# Patient Record
Sex: Female | Born: 1950 | Race: White | Hispanic: No
Health system: Midwestern US, Community
[De-identification: ages and names within clinical notes are randomized; demographics above are authoritative.]

## PROBLEM LIST (undated history)

## (undated) DIAGNOSIS — R112 Nausea with vomiting, unspecified: Secondary | ICD-10-CM

## (undated) DIAGNOSIS — F32A Depression, unspecified: Secondary | ICD-10-CM

## (undated) DIAGNOSIS — D485 Neoplasm of uncertain behavior of skin: Secondary | ICD-10-CM

## (undated) DIAGNOSIS — R21 Rash and other nonspecific skin eruption: Secondary | ICD-10-CM

## (undated) DIAGNOSIS — B370 Candidal stomatitis: Secondary | ICD-10-CM

## (undated) DIAGNOSIS — F5101 Primary insomnia: Secondary | ICD-10-CM

## (undated) DIAGNOSIS — R1033 Periumbilical pain: Secondary | ICD-10-CM

## (undated) DIAGNOSIS — D472 Monoclonal gammopathy: Secondary | ICD-10-CM

## (undated) DIAGNOSIS — I10 Essential (primary) hypertension: Secondary | ICD-10-CM

## (undated) DIAGNOSIS — K589 Irritable bowel syndrome without diarrhea: Secondary | ICD-10-CM

## (undated) DIAGNOSIS — F339 Major depressive disorder, recurrent, unspecified: Secondary | ICD-10-CM

## (undated) DIAGNOSIS — K3184 Gastroparesis: Secondary | ICD-10-CM

## (undated) DIAGNOSIS — K279 Peptic ulcer, site unspecified, unspecified as acute or chronic, without hemorrhage or perforation: Secondary | ICD-10-CM

## (undated) DIAGNOSIS — F17200 Nicotine dependence, unspecified, uncomplicated: Secondary | ICD-10-CM

## (undated) DIAGNOSIS — M1990 Unspecified osteoarthritis, unspecified site: Secondary | ICD-10-CM

## (undated) DIAGNOSIS — J449 Chronic obstructive pulmonary disease, unspecified: Secondary | ICD-10-CM

## (undated) DIAGNOSIS — K900 Celiac disease: Secondary | ICD-10-CM

## (undated) DIAGNOSIS — J439 Emphysema, unspecified: Secondary | ICD-10-CM

## (undated) DIAGNOSIS — M0520 Rheumatoid vasculitis with rheumatoid arthritis of unspecified site: Secondary | ICD-10-CM

## (undated) DIAGNOSIS — M810 Age-related osteoporosis without current pathological fracture: Secondary | ICD-10-CM

## (undated) DIAGNOSIS — G9340 Encephalopathy, unspecified: Secondary | ICD-10-CM

## (undated) DIAGNOSIS — E785 Hyperlipidemia, unspecified: Secondary | ICD-10-CM

## (undated) DIAGNOSIS — K219 Gastro-esophageal reflux disease without esophagitis: Secondary | ICD-10-CM

## (undated) DIAGNOSIS — L280 Lichen simplex chronicus: Secondary | ICD-10-CM

## (undated) HISTORY — DX: Gastroparesis: K31.84

## (undated) HISTORY — DX: Gastro-esophageal reflux disease without esophagitis: K21.9

## (undated) HISTORY — PX: ABCESS DRAINAGE: SHX399

## (undated) HISTORY — DX: Celiac disease: K90.0

## (undated) HISTORY — DX: Encephalopathy, unspecified: G93.40

## (undated) HISTORY — DX: Irritable bowel syndrome, unspecified: K58.9

## (undated) HISTORY — DX: Unspecified osteoarthritis, unspecified site: M19.90

## (undated) HISTORY — DX: Peptic ulcer, site unspecified, unspecified as acute or chronic, without hemorrhage or perforation: K27.9

## (undated) HISTORY — DX: Monoclonal gammopathy: D47.2

## (undated) HISTORY — DX: Age-related osteoporosis without current pathological fracture: M81.0

## (undated) HISTORY — DX: Chronic obstructive pulmonary disease, unspecified: J44.9

## (undated) HISTORY — DX: Emphysema, unspecified: J43.9

## (undated) HISTORY — PX: OTHER SURGICAL HISTORY: SHX169

## (undated) HISTORY — DX: Essential (primary) hypertension: I10

## (undated) HISTORY — DX: Hyperlipidemia, unspecified: E78.5

## (undated) HISTORY — DX: Major depressive disorder, recurrent, unspecified: F33.9

## (undated) HISTORY — PX: COLONOSCOPY: SHX174

## (undated) HISTORY — DX: Nicotine dependence, unspecified, uncomplicated: F17.200

---

## 1975-12-30 HISTORY — PX: TONSILLECTOMY: SUR1361

## 2003-08-03 LAB — HM PAP SMEAR

## 2015-08-01 LAB — HM COLONOSCOPY

## 2018-01-02 NOTE — Nursing Note (Signed)
Medication Administration Follow Up-Text       Medication Administration Follow Up Entered On:  01/02/2018 22:50 EST    Performed On:  01/02/2018 22:34 EST by CREED, RN, MARY E      Intervention Information:     ondansetron  Performed by Fabian November, RN, MARY E on 01/02/2018 21:34:00 EST       ondansetron,4mg   Oral       Med Response   ED Medication Response :   No adverse reaction, Symptoms improved   Pasero Opioid Induced Sedation Scale :   S = Sleep, easy to arouse   CREED, RN, MARY E - 01/02/2018 22:49 EST

## 2018-01-02 NOTE — Nursing Note (Signed)
 Adult Patient History Form-Text       Adult Patient History Entered On:  01/02/2018 22:47 EST    Performed On:  01/02/2018 22:29 EST by CREED, RN, MARY E               General Info   Patient Identified :   Identification band, Verbal   Information Given By :   Self   In Charge of News (ICON) Name :   Morgan Alexander   Pregnancy Status :   N/A   In Charge of News (ICON) Code :   3436   Has the patient received chemotherapy or biotherapy within the last 48 hours? :   No   In Clinical Trial With Signed Consent for Related Condition :   No signed consent for clinical trial   Is the patient currently (2-3 days) receiving radiation treatment? :   No   CREED, RN, MARY E - 01/02/2018 22:29 EST   Allergies   (As Of: 01/02/2018 22:47:40 EST)   Allergies (Active)   shellfish  Estimated Onset Date:   Unspecified ; Reactions:   Rash ; Created By:   Joesph, RN, Diane C; Reaction Status:   Active ; Category:   Drug ; Substance:   shellfish ; Type:   Allergy ; Severity:   Mild ; Updated By:   Joesph, RN, Diane C; Source:   Patient ; Reviewed Date:   01/02/2018 22:32 EST        Medication History   Medication List   (As Of: 01/02/2018 22:47:40 EST)   Normal Order    albuterol 2.5 mg/3 mL (0.083%) Inh Soln 3 mL  :   albuterol 2.5 mg/3 mL (0.083%) Inh Soln 3 mL ; Status:   Completed ; Ordered As Mnemonic:   albuterol 2.5 mg/3 mL (0.083%) inhalation solution ; Simple Display Line:   5 mg, 6 mL, NEB, q20min, PRN: shortness of breath or wheezing ; Ordering Provider:   FREDERICH,  MATTHEW C; Catalog Code:   albuterol ; Order Dt/Tm:   01/02/2018 15:52:02          azithromycin 250 mg Tab  :   azithromycin 250 mg Tab ; Status:   Completed ; Ordered As Mnemonic:   Zithromax ; Simple Display Line:   500 mg, 2 tabs, Oral, Once ; Ordering Provider:   HOSKINS-MD,  MATTHEW C; Catalog Code:   azithromycin ; Order Dt/Tm:   01/02/2018 17:58:30          budesonide-formoterol 160 mcg-4.5 mcg/inh Aerosol 6 gm  :   budesonide-formoterol 160 mcg-4.5 mcg/inh Aerosol 6 gm  ; Status:   Ordered ; Ordered As Mnemonic:   budesonide-formoterol 160 mcg-4.5 mcg/inh inhalation aerosol ; Simple Display Line:   2 puffs, Inhale, BID ; Ordering Provider:   JEAN JINNY SKATES; Catalog Code:   budesonide-formoterol ; Order Dt/Tm:   01/02/2018 18:45:21          cefTRIAXone 2gm/100ml NS ADM  :   cefTRIAXone 2gm/100ml NS ADM ; Status:   Completed ; Ordered As Mnemonic:   cefTRIAXone ( Rocephin ) ; Simple Display Line:   2 g, 100 mL, 200 mL/hr, IV Piggyback, Once ; Ordering Provider:   HOSKINS-MD,  MATTHEW C; Catalog Code:   cefTRIAXone ; Order Dt/Tm:   01/02/2018 17:58:29          guaiFENesin 600 mg ER Tab  :   guaiFENesin 600 mg ER Tab ; Status:   Ordered ; Ordered As Mnemonic:  Mucinex ; Simple Display Line:   600 mg, 1 tabs, Oral, q12hr ; Ordering Provider:   BALL-MD, J AUSTIN; Catalog Code:   guaiFENesin ; Order Dt/Tm:   01/02/2018 18:45:22 ; Comment:   DO NOT CRUSH          hydroxychloroquine 200 mg Tab  :   hydroxychloroquine 200 mg Tab ; Status:   Ordered ; Ordered As Mnemonic:   hydroxychloroquine ( Plaquenil ) ; Simple Display Line:   200 mg, 1 tabs, Oral, BID ; Ordering Provider:   JEAN JINNY SKATES; Catalog Code:   hydroxychloroquine ; Order Dt/Tm:   01/02/2018 19:18:45          iopamidol 76% Inj Soln 100 mL  :   iopamidol 76% Inj Soln 100 mL ; Status:   Completed ; Ordered As Mnemonic:   Isovue-370 ; Simple Display Line:   100 mL, IV Contrast, Once ; Ordering Provider:   HOSKINS-MD,  MATTHEW C; Catalog Code:   iopamidol ; Order Dt/Tm:   01/02/2018 16:41:35          albuterol-ipratropium 2.5 mg-0.5 mg/3 mL Inh Soln 3 mL  :   albuterol-ipratropium 2.5 mg-0.5 mg/3 mL Inh Soln 3 mL ; Status:   Ordered ; Ordered As Mnemonic:   DuoNeb 0.5 mg-2.5 mg/3 mL inhalation solution ; Simple Display Line:   3 mL, NEB, q4hr-WA ; Ordering Provider:   JEAN JINNY SKATES; Catalog Code:   ipratropium-albuterol ; Order Dt/Tm:   01/02/2018 18:45:22          albuterol-ipratropium 2.5 mg-0.5 mg/3 mL Inh Soln 3 mL  :    albuterol-ipratropium 2.5 mg-0.5 mg/3 mL Inh Soln 3 mL ; Status:   Ordered ; Ordered As Mnemonic:   DuoNeb 0.5 mg-2.5 mg/3 mL inhalation solution ; Simple Display Line:   3 mL, NEB, q2hr, PRN: shortness of breath or wheezing ; Ordering Provider:   JEAN JINNY SKATES; Catalog Code:   ipratropium-albuterol ; Order Dt/Tm:   01/02/2018 18:45:22          ipratropium 0.5 mg/2.5 mL Inh Soln 2.5 mL  :   ipratropium 0.5 mg/2.5 mL Inh Soln 2.5 mL ; Status:   Completed ; Ordered As Mnemonic:   Atrovent  0.5 mg/2.5 ml (0.02%) neb solution ; Simple Display Line:   0.5 mg, 2.5 mL, NEB, Once ; Ordering Provider:   HOSKINS-MD,  MATTHEW C; Catalog Code:   ipratropium ; Order Dt/Tm:   01/02/2018 15:52:02          methylPREDNISolone 40 mg preservative-free Inj  :   methylPREDNISolone 40 mg preservative-free Inj ; Status:   Ordered ; Ordered As Mnemonic:   Solu-MEDROL ; Simple Display Line:   40 mg, 1 EA, IV Push, BID ; Ordering Provider:   JEAN JINNY SKATES; Catalog Code:   methylPREDNISolone ; Order Dt/Tm:   01/02/2018 18:45:28          methylPREDNISolone 125 mg preservative-free Inj  :   methylPREDNISolone 125 mg preservative-free Inj ; Status:   Completed ; Ordered As Mnemonic:   Solu-MEDROL ; Simple Display Line:   125 mg, 1 EA, IV Push, Once ; Ordering Provider:   HOSKINS-MD,  MATTHEW C; Catalog Code:   methylPREDNISolone ; Order Dt/Tm:   01/02/2018 15:52:01          ondansetron 4 mg Tab  :   ondansetron 4 mg Tab ; Status:   Ordered ; Ordered As Mnemonic:   Zofran ; Simple Display Line:   4  mg, 1 tabs, Oral, TID ; Ordering Provider:   JEAN JINNY SKATES; Catalog Code:   ondansetron ; Order Dt/Tm:   01/02/2018 19:17:51          Sodium Chloride 0.9% intravenous solution Bolus  :   Sodium Chloride 0.9% intravenous solution Bolus ; Status:   Completed ; Ordered As Mnemonic:   Sodium Chloride 0.9% bolus ; Simple Display Line:   1,000 mL, 2000 mL/hr, IV Piggyback, Once ; Ordering Provider:   HOSKINS-MD,  MATTHEW C; Catalog Code:   Sodium Chloride  0.9% ; Order Dt/Tm:   01/02/2018 15:52:01          tiotropium 18 mcg Inh Cap  :   tiotropium 18 mcg Inh Cap ; Status:   Ordered ; Ordered As Mnemonic:   Spiriva 18 mcg inhalation capsule ; Simple Display Line:   18 mcg, 1 caps, Inhale, Daily ; Ordering Provider:   JEAN JINNY SKATES; Catalog Code:   tiotropium ; Order Dt/Tm:   01/02/2018 18:45:28 ; Comment:    THIS MEDICATION  IS IN THE    RESPIRATORY DRAWER OF YOUR [facility]   MACHINE  -RETURN TO [facility] AFTER EACH USE;     DO NOT SEND HOME WITH PATIENT            traZODone 50 mg Tab  :   traZODone 50 mg Tab ; Status:   Ordered ; Ordered As Mnemonic:   traZODone ; Simple Display Line:   50 mg, 1 tabs, Oral, Once a Day (at bedtime) ; Ordering Provider:   JEAN JINNY SKATES; Catalog Code:   traZODone ; Order Dt/Tm:   01/02/2018 19:17:52            Prescription/Discharge Order    dicyclomine  :   dicyclomine ; Status:   Completed ; Ordered As Mnemonic:   Bentyl 20 mg oral tablet ; Simple Display Line:   20 mg, 1 tabs, Oral, QID, for 5 days, 20 tabs, 0 Refill(s) ; Ordering Provider:   SADIE MAUDE RAMAN; Catalog Code:   dicyclomine ; Order Dt/Tm:   02/14/2017 18:57:21 ; Comment:    THIS MEDICATION IS ASSOCIATED   WITH   AN INCREASED RISK OF FALLS.          ondansetron  :   ondansetron ; Status:   Prescribed ; Ordered As Mnemonic:   Zofran 4 mg oral tablet ; Simple Display Line:   4 mg, 1 tabs, Oral, TID, 10 tabs, 0 Refill(s) ; Ordering Provider:   SADIE MAUDE RAMAN; Catalog Code:   ondansetron ; Order Dt/Tm:   02/14/2017 18:57:26            Home Meds    abatacept  :   abatacept ; Status:   Documented ; Ordered As Mnemonic:   Orencia ; Simple Display Line:   0 Refill(s) ; Catalog Code:   abatacept ; Order Dt/Tm:   01/02/2018 15:40:18 ; Comment:   Outpatient use only          denosumab - outpatient use only  :   denosumab - outpatient use only ; Status:   Documented ; Ordered As Mnemonic:   Prolia 60 mg/mL subcutaneous solution ; Simple Display Line:   60 mg, 1 mL,  Subcutaneous, q6mo, 1 mL, 0 Refill(s) ; Catalog Code:   denosumab - outpatient use only ; Order Dt/Tm:   01/02/2018 15:40:25 ; Comment:   Outpatient use only          hydroxychloroquine  :  hydroxychloroquine ; Status:   Documented ; Ordered As Mnemonic:   Plaquenil Sulfate ; Simple Display Line:   mg, Oral, Daily, 0 Refill(s) ; Catalog Code:   hydroxychloroquine ; Order Dt/Tm:   01/02/2018 15:40:34          hydroxychloroquine  :   hydroxychloroquine ; Status:   Documented ; Ordered As Mnemonic:   Plaquenil Sulfate ; Simple Display Line:   mg, Oral, Daily, 0 Refill(s) ; Catalog Code:   hydroxychloroquine ; Order Dt/Tm:   01/02/2018 15:40:39          methotrexate  :   methotrexate ; Status:   Documented ; Ordered As Mnemonic:   methotrexate 1 g injection ; Simple Display Line:   IV, Once, 0 Refill(s) ; Catalog Code:   methotrexate ; Order Dt/Tm:   01/02/2018 15:39:57          Misc Medication  :   Misc Medication ; Status:   Documented ; Ordered As Mnemonic:   Misc Prescription ; Simple Display Line:   see list, 0 Refill(s) ; Catalog Code:   Misc Medication ; Order Dt/Tm:   02/14/2017 16:30:43          traZODone  :   traZODone ; Status:   Documented ; Ordered As Mnemonic:   traZODone ; Simple Display Line:   Oral, 0 Refill(s) ; Catalog Code:   traZODone ; Order Dt/Tm:   01/02/2018 15:39:20            Problem History   (As Of: 01/02/2018 22:47:40 EST)   Problems(Active)    Anxiety (IMO  :61586 )  Name of Problem:   Anxiety ; Recorder:   Joesph, RN, Diane C; Confirmation:   Confirmed ; Classification:   Medical ; Code:   [phone] ; Contributor System:   PowerChart ; Last Updated:   02/14/2017 16:28 EST ; Life Cycle Date:   02/14/2017 ; Life Cycle Status:   Active ; Vocabulary:   IMO        Rheumatoid arthritis (SNOMED CT  :[id number] )  Name of Problem:   Rheumatoid arthritis ; Recorder:   Joesph, Charity fundraiser, Diane C; Confirmation:   Confirmed ; Classification:   Medical ; Code:   [id number] ; Contributor System:    PowerChart ; Last Updated:   02/14/2017 16:29 EST ; Life Cycle Date:   02/14/2017 ; Life Cycle Status:   Active ; Vocabulary:   SNOMED CT          Diagnoses(Active)    SOB - Shortness of breath  Date:   01/02/2018 ; Diagnosis Type:   Reason For Visit ; Confirmation:   Complaint of ; Clinical Dx:   SOB - Shortness of breath ; Classification:   Medical ; Clinical Service:   Emergency medicine ; Code:   PNED ; Probability:   0 ; Diagnosis Code:   [id number]        Procedure History        -    Procedure History   (As Of: 01/02/2018 22:47:40 EST)     Immunizations   Influenza Vaccine Status :   Not received   CREED, RN, MARY E - 01/03/2018 5:32 EST     Last Tetanus :   Unknown   CREED, RN, MARY E - 01/02/2018 22:29 EST   ID Risk Screen   Chills :   No   Cough (Any Duration) :   Yes   Fever :   No   Hemoptysis (Blood in  Sputum) :   No   Night Sweats :   No   Weight Loss Greater Than 10 Pounds :   No   Hx of TB Now or at Any Time In the Past (Even if on Meds) :   No   Foreign-Born :   No   Homeless or In Shelter :   No   Incarcerated Within Last 2 Years :   No   Intravenous Drug User :   No   Female Homosexual :   No   New TST/IGRA Results Pos(Within 2 yrs),Hx Recent TB Exposure :   No   CREED, RN, MARY E - 01/02/2018 22:29 EST   3 or more loose/watery stools :   No   MRSA/VRE Screening :   Admitted to an Critical Care or BMT   Patient Recent Travel History :   No recent travel   Family Member Travel History :   No recent travel   CREED, RN, MAINE E - 01/02/2018 22:29 EST   Infectious Disease Risk Factor Grid   Chills :   No   Fever :   No   Fatigue :   Yes   Headache :   No   Runny or Stuffy Nose :   Yes   Sore Throat :   No   Difficulty Breathing :   Yes   Shortness of Breath :   Yes   New or Worsening Cough :   No   Wheezing :   No   Vomiting :   No   Diarrhea :   No   Abdominal (Stomach Pain) :   No   Muscle Pain :   No   Weakness/Numbness :   No   Recent Exposure to Communicable Disease :   No   Illness With  Generalized Rash :   No   Abnormal Bleeding :   No   Unexplained Hemorrhage (Bleeding or Bruising) :   No   Arthralgia :   No   Conjunctivitis :   No   CREED, RN, MARY E - 01/02/2018 22:29 EST   Bloodless Medicine   Will Patient Accept Blood Transfusion and/or Blood Products :   Yes   CREED, RN, MARY E - 01/02/2018 22:29 EST   Nutrition   Nutritional Risk Factors :   Nausea, vomiting, diarrhea   Usual Weight :   50 kg(Converted to: 110 lb 4 oz, 110.23 lb, 1,763.70 oz)    Unintentional Weight Change Greater Than 10 lbs in the Last 6 Months :   No   CREED, RN, MARY E - 01/02/2018 22:29 EST   Functional   Sensory Deficits :   None   ADLs Prior to Admission :   Independent   CREED, RN, MARY E - 01/02/2018 22:29 EST   Social History   Social History   (As Of: 01/02/2018 22:47:40 EST)   Tobacco:        Cigarettes, 10 per day.  40 year(s).   (Last Updated: 01/02/2018 22:47:37 EST by CREED, RN, MARY E)          Alcohol:        Denies   (Last Updated: 01/02/2018 22:47:37 EST by CREED, RN, MARY E)          Substance Abuse:        Denies, Previous treatment: None.   (Last Updated: 01/02/2018 22:47:37 EST by CREED, RN, MARY E)  Harm Screen   Feels Unsafe at Home :   No   Recent Life Events :   None   Family Behavioral Health History :   None   Previous Mental Illness Diagnosis :   Anxiety disorder   Suicidal Behavior :   None   Self Harming Behavior :   None   Suicidal Ideation :   None   CREED, RN, MARY E - 01/02/2018 22:29 EST   Advance Directive   Advance Directive :   No   Patient Wishes to Receive Further Information on Advance Directives :   No   CREED, RN, RONAL BRAVO - 01/02/2018 22:29 EST   Education   Written Language :   Isadora   Caregiver/Advocate Primary Language :   Isadora   Caregiver/Advocate Written Language :   Isadora   Primary Language :   Isadora SCHWAB, RN, MARY E - 01/02/2018 22:29 EST   Caregiver/Advocate Language   Patient :   Verbal explanation, Demonstration   CREED, RN, MARY E - 01/02/2018 22:29 EST   Barriers to  Learning :   Acuity of Illness   Teaching Method :   Demonstration, Explanation   Responsible Learner Present for Session :   No   CREED, RN, MARY E - 01/02/2018 22:29 EST   DCP GENERIC CODE   Unit/Room Orientation :   Verbalizes understanding   Environmental Safety :   Verbalizes understanding   Hand Washing :   Verbalizes understanding   Infection Prevention :   Verbalizes understanding   DVT Prophylaxis :   Verbalizes understanding   Isolation Precaution :   Verbalizes understanding   CREED, RN, Risk analyst E - 01/02/2018 22:29 EST   DC Needs   Living Situation :   Home with family support   Home Equipment Rehab :   None   Anticipated Discharge Needs :   Durable medical equipment   CREED, RN, MARY E - 01/02/2018 22:29 EST   Valuables and Belongings   Does Patient Have Valuables and Belongings :   Yes   CREED, RN, MARY E - 01/02/2018 22:29 EST   DCP GENERIC CODE   At Bedside :   Cherylann SCHWAB, RN, MARY E - 01/02/2018 22:29 EST   Witness Valuables and Belongings :   Candis Alexander   Patient Search Completed :   NA   CREED, RN, MARY E - 01/02/2018 22:29 EST   Admission Complete   Admission Complete :   No   CREED, RN, MARY E - 01/02/2018 22:29 EST

## 2018-01-03 NOTE — Nursing Note (Signed)
Medication Administration Follow Up-Text       Medication Administration Follow Up Entered On:  01/03/2018 14:39 EST    Performed On:  01/03/2018 14:17 EST by Currie Paris, RN, SEAN      Intervention Information:     ondansetron  Performed by Currie Paris, RN, [place] on 01/03/2018 13:17:00 EST       ondansetron,4mg   Oral       Med Response   ED Medication Response :   Symptoms improved   MAYSONET, RN, SEAN - 01/03/2018 14:39 EST

## 2018-01-03 NOTE — Nursing Note (Signed)
Medication Administration Follow Up-Text       Medication Administration Follow Up Entered On:  01/03/2018 16:11 EST    Performed On:  01/03/2018 16:11 EST by Currie Paris, RN, SEAN      Intervention Information:     acetaminophen  Performed by Currie Paris, RN, SEAN on 01/03/2018 14:39:00 EST       acetaminophen,1000mg   Oral,mild pain (1-3)       Med Response   ED Medication Response :   No adverse reaction   MAYSONET, RN, SEAN - 01/03/2018 16:11 EST

## 2018-01-04 NOTE — Nursing Note (Signed)
Medication Administration Follow Up-Text       Medication Administration Follow Up Entered On:  01/04/2018 8:57 EST    Performed On:  01/04/2018 7:28 EST by Charlsie Merles, RN, ROBERT      Intervention Information:     acetaminophen  Performed by Ward Givens RN, Melina Copa on 01/04/2018 06:28:00 EST       acetaminophen,1000mg   Oral,mild pain (1-3)       Med Response   ED Medication Response :   No adverse reaction, Symptoms improved   Numeric Rating Pain Scale :   2   Pasero Opioid Induced Sedation Scale :   1 = Awake and alert   Respiratory Rate :   18 br/min   Charlsie Merles, RN, ROBERT - 01/04/2018 8:56 EST

## 2018-01-04 NOTE — Nursing Note (Signed)
Medication Administration Follow Up-Text       Medication Administration Follow Up Entered On:  01/04/2018 5:55 EST    Performed On:  01/04/2018 5:55 EST by Ward Givens, RN, Melina Copa      Intervention Information:     ondansetron  Performed by Ward Givens RN, Melina Copa on 01/04/2018 05:11:00 EST       ondansetron,4mg   Oral       Med Response   ED Medication Response :   No adverse reaction, Symptoms improved   Serita Grammes - 01/04/2018 5:55 EST

## 2018-01-04 NOTE — Nursing Note (Signed)
Medication Administration Follow Up-Text       Medication Administration Follow Up Entered On:  01/04/2018 0:51 EST    Performed On:  01/03/2018 21:44 EST by Ward Givens, RN, Melina Copa      Intervention Information:     ondansetron  Performed by Serita Grammes on 01/03/2018 20:44:00 EST       ondansetron,4mg   Oral       Med Response   ED Medication Response :   No adverse reaction   Serita Grammes - 01/04/2018 0:51 EST

## 2018-01-04 NOTE — Case Communication (Signed)
CM Discharge Planning Assessment - Text       CM Discharge Planning Assessment Entered On:  01/04/2018 13:21 EST    Performed On:  01/04/2018 13:16 EST by Nicki Reaper, RN, [place] :   No Living Environment Information Available     Affect/Behavior :   Appropriate   Lives With :   Family   Lives In :   [place] - 01/04/2018 13:16 EST   Rehabilitation Stairs Grid     Outside Stairs          Number of Stairs :    65                 Nicki Reaper, RN, Cyril Mourning - 01/04/2018 13:16 EST         Living Situation :   Home with family support   Barriers at Home :   None   Nicki Reaper, RN, Cyril Mourning - 01/04/2018 13:16 EST   Home Environment   Home Equipment Rehab :   Cane   ADLs Prior to Admission :   Independent   Sensory Deficits :   None   Nicki Reaper, Therapist, sports, Cyril Mourning - 01/04/2018 13:16 EST   Discharge Needs I   Previously Documented Discharge Needs :   DISCHARGE PLAN/NEEDS:  EQUIPMENT/TREATMENT NEEDS:       Previously Documented Benefits Information :   No discharge data available.     CM Progress Note :   01/04/18-[place]- pt admitted for SOB. Pt has COPD. Currently living with her son and granddaughters in apartment. SHe tells CM that her granddaugher has been sick. NO prior HH or DME. No home O2- currently on 4LO2 via [place]. Pt states that she is considering moving to [place] with daughter after DC as it is crowded at her sons and there are 10 steps outside of the apartment that she is finding hard to navigate. Will cont to follow for needs pending clincial course. No IM letter noted in cerner. reviewed letter with pt and signed copy to chart/     Nicki Reaper, RN, Cyril Mourning - 01/04/2018 13:16 EST   Discharge Needs II   DischargDischarge Device/Equipment CMe Device/Equipment CM :   Pharmacist, community Skilled Services :   No Needs   Needs Assistance with Transportation :   No   Discharge Transportation Arranged :   N/A   Needs Assistance at Home Upon Discharge :   No   Requires Caregiver Involvement :   No   Discharge  Planning Time Spent :   15 minutes   Romie Levee - 01/04/2018 13:16 EST   Benefits   Admission Medicare Message Provided :   01/04/2018 13:00 EST   Insurance Information :   Managed Medicare   [place], RN, Cyril Mourning - 01/04/2018 13:16 EST   Advance Directive   *Advance Directive :   No   Patient Wishes to Receive Further Information on Advance Directives :   No   Romie Levee - 01/04/2018 13:16 EST   Discharge Planning   Discharge Arrangements :       Patient Post-Acute Information    Patient Name: Morgan Alexander, Morgan Alexander  MRN: [id number]  FIN: [id number]  Gender: Female  DOB: 2051/12/12  Age:  67 Years        *** No Post-Acute Placement(s) Listed ***          ***  No Post-Acute Service(s) Listed Nicki Reaper, RN, Kristen - 01/04/2018 13:16 EST   Discharge Planning Assessment Status   Discharge Planning Assessment Complete :   Yes   Nicki Reaper, RN, Kristen - 01/04/2018 13:16 EST

## 2018-01-04 NOTE — Nursing Note (Signed)
Medication Administration Follow Up-Text       Medication Administration Follow Up Entered On:  01/04/2018 17:30 EST    Performed On:  01/04/2018 17:30 EST by Charlsie Merles, RN, ROBERT      Intervention Information:     acetaminophen  Performed by Charlsie Merles, RN, ROBERT on 01/04/2018 15:38:00 EST       acetaminophen,1000mg   Oral,mild pain (1-3)       Med Response   ED Medication Response :   No adverse reaction, Symptoms improved   Numeric Rating Pain Scale :   3   Pasero Opioid Induced Sedation Scale :   1 = Awake and alert   Respiratory Rate :   20 br/min   Charlsie Merles, RN, ROBERT - 01/04/2018 17:30 EST

## 2018-01-04 NOTE — Nursing Note (Signed)
Medication Administration Follow Up-Text       Medication Administration Follow Up Entered On:  01/04/2018 17:30 EST    Performed On:  01/04/2018 17:30 EST by Charlsie Merles, RN, ROBERT      Intervention Information:     ondansetron  Performed by Charlsie Merles RN, ROBERT on 01/04/2018 14:07:00 EST       ondansetron,4mg   Oral       Med Response   ED Medication Response :   No adverse reaction   Charlsie Merles, RN, ROBERT - 01/04/2018 17:30 EST

## 2018-01-04 NOTE — Nursing Note (Signed)
Medication Administration Follow Up-Text       Medication Administration Follow Up Entered On:  01/04/2018 21:43 EST    Performed On:  01/04/2018 21:43 EST by Minus Breeding, RN, Renato Gails      Intervention Information:     chlorpheniramine-hydrocodone  Performed by Minus Breeding RN, Renato Gails on 01/04/2018 20:12:00 EST       HYDROcodone-chlorpheniramine,58mL  Oral,cough       Med Response   ED Medication Response :   Symptoms improved   Minus Breeding, RNRenato Gails - 01/04/2018 21:43 EST

## 2018-01-04 NOTE — Nursing Note (Signed)
Medication Administration Follow Up-Text       Medication Administration Follow Up Entered On:  01/04/2018 21:43 EST    Performed On:  01/04/2018 21:43 EST by Bonnee Quin, RN, Danella Maiers      Intervention Information:     ondansetron  Performed by Bonnee Quin RN, Danella Maiers on 01/04/2018 20:14:00 EST       ondansetron,4mg   Oral       Med Response   ED Medication Response :   Symptoms improved   Bonnee Quin, RN, Danella Maiers - 01/04/2018 21:43 EST

## 2018-01-05 NOTE — Nursing Note (Signed)
Medication Administration Follow Up-Text       Medication Administration Follow Up Entered On:  01/05/2018 9:39 EST    Performed On:  01/05/2018 9:39 EST by Monia Pouch, RN, Eliezer Lofts      Intervention Information:     chlorpheniramine-hydrocodone  Performed by Monia Pouch, RN, Eliezer Lofts on 01/05/2018 08:45:00 EST       HYDROcodone-chlorpheniramine,61mL  Oral,cough       Med Response   ED Medication Response :   Symptoms improved   Numeric Rating Pain Scale :   0 = No pain   Pasero Opioid Induced Sedation Scale :   1 = Awake and alert   Respiratory Rate :   18 br/min   Monia Pouch RN, Eliezer Lofts - 01/05/2018 9:39 EST

## 2018-01-05 NOTE — Nursing Note (Signed)
Medication Administration Follow Up-Text       Medication Administration Follow Up Entered On:  01/05/2018 22:47 EST    Performed On:  01/05/2018 22:47 EST by Dewaine Conger, RN, Verline Lema      Intervention Information:     ondansetron  Performed by Dewaine Conger RN, Kaitlyn on 01/05/2018 21:00:00 EST       ondansetron,4mg   Oral       Med Response   ED Medication Response :   No adverse reaction, Symptoms improved   Ermalene Postin - 01/05/2018 22:47 EST

## 2018-01-05 NOTE — Nursing Note (Signed)
Medication Administration Follow Up-Text       Medication Administration Follow Up Entered On:  01/05/2018 14:41 EST    Performed On:  01/05/2018 14:41 EST by Monia Pouch, RN, Eliezer Lofts      Intervention Information:     acetaminophen  Performed by Monia Pouch, RN, Eliezer Lofts on 01/05/2018 14:12:00 EST       acetaminophen,1000mg   Oral,mild pain (1-3)       Med Response   ED Medication Response :   Symptoms improved   Numeric Rating Pain Scale :   0 = No pain   Pasero Opioid Induced Sedation Scale :   1 = Awake and alert   Respiratory Rate :   18 br/min   Monia Pouch RN, Eliezer Lofts - 01/05/2018 14:41 EST

## 2018-01-05 NOTE — Nursing Note (Signed)
Medication Administration Follow Up-Text       Medication Administration Follow Up Entered On:  01/05/2018 14:41 EST    Performed On:  01/05/2018 14:41 EST by Monia Pouch, RN, Eliezer Lofts      Intervention Information:     ondansetron  Performed by Monia Pouch, RN, Eliezer Lofts on 01/05/2018 14:12:00 EST       ondansetron,4mg   Oral       Med Response   ED Medication Response :   Symptoms improved   Pasero Opioid Induced Sedation Scale :   1 = Awake and alert   Respiratory Rate :   18 br/min   Monia Pouch RN, Eliezer Lofts - 01/05/2018 14:41 EST

## 2018-01-05 NOTE — Nursing Note (Signed)
Medication Administration Follow Up-Text       Medication Administration Follow Up Entered On:  01/05/2018 2:51 EST    Performed On:  01/05/2018 1:47 EST by Minus Breeding, RN, Renato Gails      Intervention Information:     acetaminophen  Performed by Minus Breeding, RN, Renato Gails on 01/05/2018 00:47:00 EST       acetaminophen,1000mg   Oral,mild pain (1-3)       Med Response   ED Medication Response :   Symptoms improved   Minus Breeding, RN, Renato Gails - 01/05/2018 2:51 EST

## 2018-01-05 NOTE — Nursing Note (Signed)
Medication Administration Follow Up-Text       Medication Administration Follow Up Entered On:  01/05/2018 22:47 EST    Performed On:  01/05/2018 22:47 EST by Elvina Sidle, RN, Kaitlyn      Intervention Information:     chlorpheniramine-hydrocodone  Performed by Elvina Sidle RN, Kaitlyn on 01/05/2018 21:00:00 EST       HYDROcodone-chlorpheniramine,24mL  Oral,cough       Med Response   ED Medication Response :   No adverse reaction, Symptoms improved   Iline Oven - 01/05/2018 22:47 EST

## 2018-01-05 NOTE — Nursing Note (Signed)
Medication Administration Follow Up-Text       Medication Administration Follow Up Entered On:  01/05/2018 6:24 EST    Performed On:  01/05/2018 6:24 EST by Bonnee Quin, RN, Danella Maiers      Intervention Information:     ondansetron  Performed by Bonnee Quin, RN, Danella Maiers on 01/05/2018 05:00:00 EST       ondansetron,4mg   Oral       Med Response   ED Medication Response :   Symptoms improved   Bonnee Quin, RN, Danella Maiers - 01/05/2018 6:24 EST

## 2018-01-06 NOTE — Nursing Note (Signed)
Medication Administration Follow Up-Text       Medication Administration Follow Up Entered On:  01/06/2018 0:24 EST    Performed On:  01/06/2018 0:24 EST by Dewaine Conger, RN, Verline Lema      Intervention Information:     acetaminophen  Performed by Dewaine Conger RN, Kaitlyn on 01/05/2018 23:14:00 EST       acetaminophen,1000mg   Oral,mild pain (1-3)       Med Response   ED Medication Response :   No adverse reaction, Symptoms improved   Dewaine Conger RN, Verline Lema - 01/06/2018 0:24 EST

## 2018-01-06 NOTE — Nursing Note (Signed)
Medication Administration Follow Up-Text       Medication Administration Follow Up Entered On:  01/06/2018 21:16 EST    Performed On:  01/06/2018 21:16 EST by Dewaine Conger, RN, Verline Lema      Intervention Information:     ondansetron  Performed by Dewaine Conger RN, Kaitlyn on 01/06/2018 20:46:00 EST       ondansetron,4mg   Oral       Med Response   ED Medication Response :   No adverse reaction, Symptoms improved   Morgan Alexander - 01/06/2018 21:16 EST

## 2018-01-06 NOTE — Nursing Note (Signed)
Medication Administration Follow Up-Text       Medication Administration Follow Up Entered On:  01/06/2018 8:13 EST    Performed On:  01/06/2018 7:19 EST by Silverio Lay, RN, [place] L      Intervention Information:     ondansetron  Performed by Dewaine Conger RN, Kaitlyn on 01/06/2018 06:19:00 EST       ondansetron,4mg   Oral       Med Response   ED Medication Response :   No change in symptoms   Pasero Opioid Induced Sedation Scale :   1 = Awake and alert   Respiratory Rate :   18 br/min   COLLIER, RN, ASHLEY L - 01/06/2018 8:13 EST

## 2018-01-06 NOTE — Nursing Note (Signed)
Medication Administration Follow Up-Text       Medication Administration Follow Up Entered On:  01/06/2018 21:16 EST    Performed On:  01/06/2018 21:16 EST by Dewaine Conger, RN, [place]      Intervention Information:     ondansetron  Performed by Silverio Lay, RN, ASHLEY L on 01/06/2018 16:44:00 EST       ondansetron,4mg   Oral       Med Response   ED Medication Response :   No adverse reaction, Symptoms improved   Ermalene Postin - 01/06/2018 21:16 EST

## 2018-01-06 NOTE — Nursing Note (Signed)
Medication Administration Follow Up-Text       Medication Administration Follow Up Entered On:  01/06/2018 21:16 EST    Performed On:  01/06/2018 21:16 EST by Dewaine Conger, RN, [place]      Intervention Information:     chlorpheniramine-hydrocodone  Performed by Dewaine Conger RN, Kaitlyn on 01/06/2018 20:46:00 EST       HYDROcodone-chlorpheniramine,72mL  Oral,cough       Med Response   ED Medication Response :   No adverse reaction, Symptoms improved   Ermalene Postin - 01/06/2018 21:16 EST

## 2018-01-06 NOTE — Case Communication (Signed)
CM Discharge Planning Assessment - Text       CM Discharge Planning Ongoing Assessment Entered On:  01/06/2018 15:45 EST    Performed On:  01/06/2018 15:43 EST by Nicki Reaper, RN, [place]               Discharge Needs I   Previously Documented Discharge Needs :   DISCHARGE PLAN/NEEDS:  Needs Assistance with Transportation: No Nicki Reaper, RN, Kristen - 01/04/18 13:16:00  EQUIPMENT/TREATMENT NEEDS:  Needs Assistance at Home Upon Discharge: No Nicki Reaper, RN, Kristen - 01/04/18 13:16:00  Professional Skilled Services: No Needs - Nicki Reaper, RN, Cyril Mourning - 01/04/18 13:16:00       Previously Documented Benefits Information :   Performed By: Nicki Reaper RN, Kristen  - 01/04/18 13:16:00       CM Progress Note :   01/04/18-[place]- pt admitted for SOB. Pt has COPD. Currently living with her son and granddaughters in apartment. SHe tells CM that her granddaugher has been sick. NO prior HH or DME. No home O2- [place] 4LO2 via [place]. Pt states that [place] after [place] as it is crowded at her sons and there are 10 steps outside of the apartment that she is finding hard to navigate. Will cont to follow for needs pending clincial course. No IM letter noted in cerner. reviewed letter with pt and signed copy to chart/    01/06/18-[place]- pt back on O2 today. Per family everyone in the house is sick. Pt still [facility] going [facility] in [place]. States she will most likely DC home to her sons and then consider moving at a later time.     Nicki Reaper, RN, Kristen - 01/06/2018 15:43 EST   Discharge Needs II   DischargDischarge Device/Equipment CMe Device/Equipment CM :   Pharmacist, community Skilled Services :   No Needs   Needs Assistance with Transportation :   No   Discharge Transportation Arranged :   N/A   Needs Assistance at Home Upon Discharge :   No   Requires Caregiver Involvement :   No   Discharge Planning Time Spent :   15 minutes   Nicki Reaper, RN, Cyril Mourning - 01/06/2018 15:43 EST

## 2018-01-06 NOTE — Nursing Note (Signed)
Medication Administration Follow Up-Text       Medication Administration Follow Up Entered On:  01/06/2018 10:28 EST    Performed On:  01/06/2018 10:28 EST by Steffanie Dunn, RN, ASHLEY L      Intervention Information:     acetaminophen  Performed by Steffanie Dunn, RN, ASHLEY L on 01/06/2018 09:01:00 EST       acetaminophen,1000mg   Oral,mild pain (1-3)       Med Response   ED Medication Response :   Symptoms improved   FACES Pain Scale :   0 = No hurt   Pasero Opioid Induced Sedation Scale :   2 = Slightly drowsy, easily aroused   Respiratory Rate :   18 br/min   COLLIER, RN, ASHLEY L - 01/06/2018 10:28 EST

## 2018-01-07 NOTE — Nursing Note (Signed)
Medication Administration Follow Up-Text       Medication Administration Follow Up Entered On:  01/07/2018 17:12 EST    Performed On:  01/07/2018 17:12 EST by Silverio Lay, RN, ASHLEY L      Intervention Information:     ondansetron  Performed by Silverio Lay, RN, ASHLEY L on 01/07/2018 16:15:00 EST       ondansetron,4mg   Oral       Med Response   ED Medication Response :   No change in symptoms   COLLIER, RN, ASHLEY L - 01/07/2018 17:12 EST

## 2018-01-07 NOTE — Nursing Note (Signed)
Medication Administration Follow Up-Text       Medication Administration Follow Up Entered On:  01/07/2018 21:43 EST    Performed On:  01/07/2018 21:43 EST by Dewaine Conger, RN, [place]      Intervention Information:     acetaminophen  Performed by Dewaine Conger, RN, Kaitlyn on 01/07/2018 21:01:00 EST       acetaminophen,1000mg   Oral,mild pain (1-3)       Med Response   ED Medication Response :   No adverse reaction, Symptoms improved   Ermalene Postin - 01/07/2018 21:43 EST

## 2018-01-07 NOTE — Nursing Note (Signed)
Medication Administration Follow Up-Text       Medication Administration Follow Up Entered On:  01/07/2018 16:18 EST    Performed On:  01/07/2018 16:18 EST by Silverio Lay, RN, [place] L      Intervention Information:     acetaminophen  Performed by Silverio Lay, RN, ASHLEY L on 01/07/2018 11:57:00 EST       acetaminophen,1000mg   Oral,mild pain (1-3)       Med Response   ED Medication Response :   Symptoms improved   Numeric Rating Pain Scale :   0 = No pain   Pasero Opioid Induced Sedation Scale :   1 = Awake and alert   COLLIER, RN, ASHLEY L - 01/07/2018 16:18 EST

## 2018-01-07 NOTE — Nursing Note (Signed)
Medication Administration Follow Up-Text       Medication Administration Follow Up Entered On:  01/07/2018 8:43 EST    Performed On:  01/07/2018 8:43 EST by Silverio Lay, RN, [place] L      Intervention Information:     ondansetron  Performed by Dewaine Conger RN, Kaitlyn on 01/07/2018 06:35:00 EST       ondansetron,4mg   Oral       Med Response   ED Medication Response :   No change in symptoms   COLLIER, RN, ASHLEY L - 01/07/2018 8:43 EST

## 2018-01-07 NOTE — Nursing Note (Signed)
Medication Administration Follow Up-Text       Medication Administration Follow Up Entered On:  01/07/2018 21:44 EST    Performed On:  01/07/2018 21:44 EST by Dewaine Conger, RN, Verline Lema      Intervention Information:     ondansetron  Performed by Dewaine Conger RN, Kaitlyn on 01/07/2018 21:01:00 EST       ondansetron,4mg   Oral       Med Response   ED Medication Response :   No adverse reaction, Symptoms improved   Ermalene Postin - 01/07/2018 21:44 EST

## 2018-01-07 NOTE — Nursing Note (Signed)
Medication Administration Follow Up-Text       Medication Administration Follow Up Entered On:  01/07/2018 3:25 EST    Performed On:  01/07/2018 3:25 EST by Dewaine Conger, RN, [place]      Intervention Information:     acetaminophen  Performed by Dewaine Conger, RN, Kaitlyn on 01/07/2018 02:45:00 EST       acetaminophen,1000mg   Oral,mild pain (1-3)       Med Response   ED Medication Response :   No adverse reaction, Symptoms improved   Ermalene Postin - 01/07/2018 3:25 EST

## 2018-01-08 NOTE — Nursing Note (Signed)
Medication Administration Follow Up-Text       Medication Administration Follow Up Entered On:  01/08/2018 0:24 EST    Performed On:  01/08/2018 0:24 EST by Dewaine Conger, RN, Verline Lema      Intervention Information:     chlorpheniramine-hydrocodone  Performed by Dewaine Conger RN, Kaitlyn on 01/07/2018 22:27:00 EST       HYDROcodone-chlorpheniramine,39mL  Oral,cough       Med Response   ED Medication Response :   No adverse reaction, Symptoms improved   Ermalene Postin - 01/08/2018 0:24 EST

## 2018-01-08 NOTE — Nursing Note (Signed)
Medication Administration Follow Up-Text       Medication Administration Follow Up Entered On:  01/08/2018 10:01 EST    Performed On:  01/08/2018 10:01 EST by Silverio Lay, RN, [place] L      Intervention Information:     ondansetron  Performed by Dewaine Conger RN, Kaitlyn on 01/08/2018 06:25:00 EST       ondansetron,4mg   Oral       Med Response   ED Medication Response :   Symptoms improved, No change in symptoms   COLLIER, RN, ASHLEY L - 01/08/2018 10:01 EST

## 2018-01-08 NOTE — Case Communication (Signed)
CM Discharge Planning Assessment - Text       CM Discharge Planning Ongoing Assessment Entered On:  01/08/2018 12:20 EST    Performed On:  01/08/2018 12:17 EST by Nicki Reaper, RN, [place]               Discharge Needs I   Previously Documented Discharge Needs :   DISCHARGE PLAN/NEEDS:  Needs Assistance with Transportation: No Nicki Reaper, RN, Kristen - 01/06/18 15:43:00  EQUIPMENT/TREATMENT NEEDS:  Needs Assistance at Home Upon Discharge: No Nicki Reaper, RN, Kristen - 01/06/18 15:43:00  Professional Skilled Services: No Needs - Nicki Reaper, RN, Cyril Mourning - 01/06/18 15:43:00       Previously Documented Benefits Information :   Performed By: Nicki Reaper RN, Kristen  - 01/04/18 13:16:00       CM Progress Note :   01/04/18-[place]- pt admitted for SOB. Pt has COPD. Currently living with her son and granddaughters in apartment. SHe tells CM that her granddaugher has been sick. NO prior HH or DME. No home O2- [place]2 [place]. Pt states that [place] after [place] as it is crowded at her sons and there are 10 steps outside of the apartment that she is finding hard to navigate. Will cont to follow for needs pending clincial course. No IM letter noted in cerner. reviewed letter with pt and signed copy to chart/    01/06/18-[place]- pt back on O2 today. Per family everyone in the house is sick. Pt still [facility] in [place]. States she will most likely DC home to her sons and then consider moving at a later time.    01/08/18-[place]- Pt has DC orders currenly off O2 as of 0400. RN completed walk test RA sats 92% or greater. Pt sons will pick pt up later today. CM attempted to make f/u appt for Pt with pulonologist. MD office is clised on Friday per answering service and pt will need to call on Monday to make appt. IM letter reviewed and signed. NO additional needs.     Nicki Reaper, RN, Kristen - 01/08/2018 12:17 EST   Discharge Needs II   DischargDischarge Device/Equipment CMe Device/Equipment CM :   Visual merchandiser Skilled Services :   No Needs   Needs Assistance with Transportation :   No   Discharge Transportation Arranged :   N/A   Needs Assistance at Home Upon Discharge :   No   Requires Caregiver Involvement :   No   Discharge Planning Time Spent :   15 minutes   Romie Levee - 01/08/2018 12:17 EST   Benefits   Admission Medicare Message Provided :   01/04/2018 13:00 EST   Insurance Information :   Managed Medicare   [place], RN, Cyril Mourning - 01/08/2018 12:17 EST   Discharge Planning   Discharge Arrangements :       Patient Post-Acute Information    Patient Name: Morgan Alexander, Morgan Alexander  MRN: [id number]  FIN: [id number]  Gender: Female  DOB: 2051-06-17  Age:  67 Years        *** No Post-Acute Placement(s) Listed ***          *** No Post-Acute Service(s) Listed [facility] Services :   Please call Monday to make follow up appointment with Pulmonologist Office- Dr Diona Foley  [phone]     Interventions Performed :   All resolved   Discharge Plan Discussion :  Discussed with patient, Patient agrees with plan   Discharge Medicare Message Date/Time :   01/08/2018 8:45 EST   Barriers to Discharge Identified :   None identified   Romie Levee - 01/08/2018 12:17 EST   Discharge Planning Assessment Status   Discharge Planning Assessment Complete :   Yes   Nicki Reaper, RN, Kristen - 01/08/2018 12:17 EST

## 2018-01-08 NOTE — Discharge Summary (Signed)
 Inpatient Patient Summary               [facility]  [address]  [place], [place] [address]  [phone]  Patient Discharge Instructions    Name: Morgan Alexander, Morgan Alexander  Current Date: 01/08/2018 13:16:49  DOB: 11-03-1951 MRN: [id number] FIN: [id number]  Patient Address: [address] [place] [address]  Patient Phone: [phone]  Primary Care Provider:  Name: DARIO PRENTICE LOVING  Phone: [phone]   Immunizations Provided:         Immunization(s) Given This Visit    Name Date   influenza virus vaccine, inactivated 01/03/18 17:53:00          Discharge Diagnosis: 1:Bronchiolitis; 2:COPD with acute exacerbation; 4:Rheumatoid arthritis involving both hands; 5:Lichen simplex chronicus; 6:Anxiety  Discharged To: TO, ANTICIPATED%>  Home Treatments: TREATMENTS, ANTICIPATED%>  Devices/Equipment: EQUIPMENT REHAB%>Cane  Post Hospital Services: HOSPITAL SERVICES%>Please call Monday to make follow up appointment with Pulmonologist Office- Dr Mercer  [phone]      Professional Skilled Services: SKILLED SERVICES%>  Special Services and Community Resources: SERV AND COMM RES, ANTICIPATED%>  Mode of Discharge Transportation: TRANSPORTATION%>Ground ambulance  Discharge Orders          Discharge Patient 01/08/18 6:52:00 EST        Comment:     Medications   During the course of your visit, your medication list was updated with the most current information. The details of those changes are reflected below:          New Medications  Other Medications  budesonide-formoterol (budesonide-formoterol 160 mcg-4.5 mcg/inh inhalation aerosol) 2 Puffs Inhale (breathe in) 2 times a day.  Last Dose:____________________  tiotropium (Spiriva 18 mcg inhalation capsule) 1 Capsules Inhale (breathe in) every day.  Last Dose:____________________  Medications That Were Updated - Follow Current Instructions  Other Medications  Current: abatacept (Orencia ClickJect 125 mg/mL subcutaneous solution) 125 Milligram Subcutaneous (under the  skin) every week. on Monday., Outpatient use only  Last Dose:____________________    Current: DULoxetine (Cymbalta 30 mg oral delayed release capsule) 1 Capsules Oral (given by mouth) At Bedtime (Once a Day). (do not crush or chew).  Last Dose:____________________  Current: DULoxetine (Cymbalta 60 mg oral delayed release capsule) 1 Capsules Oral (given by mouth) once a day (in the morning). (do not crush or chew).  Last Dose:____________________    Current: hydroxychloroquine (Plaquenil Sulfate 200 mg oral tablet) 1 Tabs Oral (given by mouth) every day.  Last Dose:____________________    Current: methotrexate (Rasuvo 25 mg/0.5 mL subcutaneous solution) 25 Milligram Subcutaneous (under the skin) every week. on Saturday.  Last Dose:____________________    Current: ondansetron (Zofran 4 mg oral tablet) 1 Tabs Oral (given by mouth) 3 times a day as needed nausea/vomiting.  Last Dose:____________________    Current: predniSONE (predniSONE 20 mg oral tablet) 2 Tabs Oral (given by mouth) every day.  Last Dose:____________________    Current: traZODone (traZODone 50 mg oral tablet) 1 Tabs Oral (given by mouth) Once a Day (at bedtime)., DO NOT CRUSH  Last Dose:____________________    Medications That Have Not Changed  Other Medications  acetaminophen 1,000 Milligram Oral (given by mouth) every 6 hours as needed pain.  Last Dose:____________________  bifidobacterium infantis (Align 4 mg oral capsule) 1 Capsules Oral (given by mouth) every day.  Last Dose:____________________  cholecalciferol (Vitamin D3) 1 Tabs Oral (given by mouth) every day.  Last Dose:____________________  colesevelam (Welchol 625 mg oral tablet) 3 Tabs Oral (given by mouth) 2 times a  day.  Last Dose:____________________  denosumab - outpatient use only (Prolia 60 mg/mL subcutaneous solution) 1 Milliliter Subcutaneous (under the skin) every 6 months., Outpatient use only  Last Dose:____________________  dicyclomine (dicyclomine 20 mg oral tablet) 1 Tabs  Oral (given by mouth) 4 times a day as needed PRN for 7 Days. gi distress.,  THIS MEDICATION IS ASSOCIATED  WITH  AN INCREASED RISK OF FALLS.  Last Dose:____________________  folic acid (folic acid 1 mg oral tablet) 1 Tabs Oral (given by mouth) every day.  Last Dose:____________________  omeprazole (omeprazole 20 mg oral delayed release capsule) 1 Capsules Oral (given by mouth) 2 times a day.  Last Dose:____________________  turmeric (turmeric 500 mg oral capsule) 1 Capsules Oral (given by mouth) every day.  Last Dose:____________________        [facility] would like to thank you for allowing us  to assist you with your healthcare needs. The following includes patient education materials and information regarding your injury/illness.    Morgan Alexander, Morgan Alexander has been given the following list of follow-up instructions, prescriptions, and patient education materials:  Follow-up Instructions:              With: Address: When:   [address] [place], [place] [address]  [phone] In 2 weeks                   It is important to always keep an active list of medications available so that you can share with other providers and manage your medications appropriately. As an additional courtesy, we are also providing you with your final active medications list that you can keep with you.           abatacept (Orencia ClickJect 125 mg/mL subcutaneous solution) 125 Milligram Subcutaneous (under the skin) every week. on Monday., Outpatient use only  acetaminophen 1,000 Milligram Oral (given by mouth) every 6 hours as needed pain.  bifidobacterium infantis (Align 4 mg oral capsule) 1 Capsules Oral (given by mouth) every day.  budesonide-formoterol (budesonide-formoterol 160 mcg-4.5 mcg/inh inhalation aerosol) 2 Puffs Inhale (breathe in) 2 times a day.  cholecalciferol (Vitamin D3) 1 Tabs Oral (given by mouth) every day.  colesevelam (Welchol 625 mg oral tablet) 3 Tabs Oral (given by mouth) 2 times a  day.  denosumab - outpatient use only (Prolia 60 mg/mL subcutaneous solution) 1 Milliliter Subcutaneous (under the skin) every 6 months., Outpatient use only  dicyclomine (dicyclomine 20 mg oral tablet) 1 Tabs Oral (given by mouth) 4 times a day as needed PRN for 7 Days. gi distress.,  THIS MEDICATION IS ASSOCIATED  WITH  AN INCREASED RISK OF FALLS.  DULoxetine (Cymbalta 30 mg oral delayed release capsule) 1 Capsules Oral (given by mouth) At Bedtime (Once a Day). (do not crush or chew).  DULoxetine (Cymbalta 60 mg oral delayed release capsule) 1 Capsules Oral (given by mouth) once a day (in the morning). (do not crush or chew).  folic acid (folic acid 1 mg oral tablet) 1 Tabs Oral (given by mouth) every day.  hydroxychloroquine (Plaquenil Sulfate 200 mg oral tablet) 1 Tabs Oral (given by mouth) every day.  methotrexate (Rasuvo 25 mg/0.5 mL subcutaneous solution) 25 Milligram Subcutaneous (under the skin) every week. on Saturday.  omeprazole (omeprazole 20 mg oral delayed release capsule) 1 Capsules Oral (given by mouth) 2 times a day.  ondansetron (Zofran 4 mg oral tablet) 1 Tabs Oral (given by mouth) 3 times a day as needed nausea/vomiting.  predniSONE (predniSONE 20 mg  oral tablet) 2 Tabs Oral (given by mouth) every day.  tiotropium (Spiriva 18 mcg inhalation capsule) 1 Capsules Inhale (breathe in) every day.  traZODone (traZODone 50 mg oral tablet) 1 Tabs Oral (given by mouth) Once a Day (at bedtime)., DO NOT CRUSH  turmeric (turmeric 500 mg oral capsule) 1 Capsules Oral (given by mouth) every day.      Take only the medications listed above. Contact your doctor prior to taking any medications not on this list.      Discharge instructions, if any, will display below    Instructions for Diet: INSTRUCTIONS FOR DIET%>   Instructions for Supplements: SUPPLEMENT INSTRUCTIONS%>   Instructions for Activity: INSTRUCTIONS FOR ACTIVITY%>   Instructions for Wound Care: INSTRUCTIONS FOR WOUND CARE%>    Medication leaflets,  if any, will display below     Patient education materials, if any, will display below          IS IT A STROKE? Act FAST and Check for these signs:    FACE                         Does the face look uneven?    ARM                         Does one arm drift down?    SPEECH                    Does their speech sound strange?    TIME                         Call 9-1-1 at any sign of stroke  Heart Attack Signs  Chest discomfort: Most heart attacks involve discomfort in the center of the chest and lasts more than a few minutes, or goes away and comes back. It can feel like uncomfortable pressure, squeezing, fullness or pain.  Discomfort in upper body: Symptoms can include pain or discomfort in one or both arms, back, neck, jaw or stomach.  Shortness of breath: With or without discomfort.  Other signs: Breaking out in a cold sweat, nausea, or lightheaded.  Remember, MINUTES DO MATTER. If you experience any of these heart attack warning signs, call 9-1-1 to get immediate medical attention!     ---------------------------------------------------------------------------------------------------------------------  [facility] allows you to manage your health, view your test results, and retrieve your discharge documents from your hospital stay securely and conveniently from your computer.  To begin the enrollment process, visit [email or web address] Click on "Sign up now" under [facility].

## 2018-01-08 NOTE — Discharge Summary (Signed)
 Inpatient Clinical Summary             [facility]  Post-Acute Care Transfer Instructions  PERSON INFORMATION   Name: Morgan Alexander, Morgan Alexander   MRN: [id number]    FIN#: [id number]   PHYSICIANS  Admitting Physician: JEAN JINNY SKATES  Attending Physician: JEAN JINNY SKATES   PCP: DARIO PRENTICE LOVING  Discharge Diagnosis: 1:Bronchiolitis; 2:COPD with acute exacerbation; 4:Rheumatoid arthritis involving both hands; 5:Lichen simplex chronicus; 6:Anxiety  Comment:       PATIENT EDUCATION INFORMATION  Instructions:               Medication Leaflets:               Follow-up:                           With: Address: When:   [address] [place], [place] [address]  [phone] In 2 weeks                             MEDICATION LIST  Medication Reconciliation at Discharge:          New Medications  Other Medications  budesonide-formoterol (budesonide-formoterol 160 mcg-4.5 mcg/inh inhalation aerosol) 2 Puffs Inhale (breathe in) 2 times a day.  Last Dose:____________________  tiotropium (Spiriva 18 mcg inhalation capsule) 1 Capsules Inhale (breathe in) every day.  Last Dose:____________________  Medications That Were Updated - Follow Current Instructions  Other Medications  Current: abatacept (Orencia ClickJect 125 mg/mL subcutaneous solution) 125 Milligram Subcutaneous (under the skin) every week. on Monday., Outpatient use only  Last Dose:____________________    Current: DULoxetine (Cymbalta 30 mg oral delayed release capsule) 1 Capsules Oral (given by mouth) At Bedtime (Once a Day). (do not crush or chew).  Last Dose:____________________  Current: DULoxetine (Cymbalta 60 mg oral delayed release capsule) 1 Capsules Oral (given by mouth) once a day (in the morning). (do not crush or chew).  Last Dose:____________________    Current: hydroxychloroquine (Plaquenil Sulfate 200 mg oral tablet) 1 Tabs Oral (given by mouth) every day.  Last Dose:____________________    Current: methotrexate (Rasuvo 25  mg/0.5 mL subcutaneous solution) 25 Milligram Subcutaneous (under the skin) every week. on Saturday.  Last Dose:____________________    Current: ondansetron (Zofran 4 mg oral tablet) 1 Tabs Oral (given by mouth) 3 times a day as needed nausea/vomiting.  Last Dose:____________________    Current: predniSONE (predniSONE 20 mg oral tablet) 2 Tabs Oral (given by mouth) every day.  Last Dose:____________________    Current: traZODone (traZODone 50 mg oral tablet) 1 Tabs Oral (given by mouth) Once a Day (at bedtime)., DO NOT CRUSH  Last Dose:____________________    Medications That Have Not Changed  Other Medications  acetaminophen 1,000 Milligram Oral (given by mouth) every 6 hours as needed pain.  Last Dose:____________________  bifidobacterium infantis (Align 4 mg oral capsule) 1 Capsules Oral (given by mouth) every day.  Last Dose:____________________  cholecalciferol (Vitamin D3) 1 Tabs Oral (given by mouth) every day.  Last Dose:____________________  colesevelam (Welchol 625 mg oral tablet) 3 Tabs Oral (given by mouth) 2 times a day.  Last Dose:____________________  denosumab - outpatient use only (Prolia 60 mg/mL subcutaneous solution) 1 Milliliter Subcutaneous (under the skin) every 6 months., Outpatient use only  Last Dose:____________________  dicyclomine (dicyclomine 20 mg oral tablet) 1 Tabs Oral (given by mouth) 4 times a day as needed  PRN for 7 Days. gi distress.,  THIS MEDICATION IS ASSOCIATED  WITH  AN INCREASED RISK OF FALLS.  Last Dose:____________________  folic acid (folic acid 1 mg oral tablet) 1 Tabs Oral (given by mouth) every day.  Last Dose:____________________  omeprazole (omeprazole 20 mg oral delayed release capsule) 1 Capsules Oral (given by mouth) 2 times a day.  Last Dose:____________________  turmeric (turmeric 500 mg oral capsule) 1 Capsules Oral (given by mouth) every day.  Last Dose:____________________         Patient's Final Home Medication List Upon Discharge:           abatacept  (Orencia ClickJect 125 mg/mL subcutaneous solution) 125 Milligram Subcutaneous (under the skin) every week. on Monday., Outpatient use only  acetaminophen 1,000 Milligram Oral (given by mouth) every 6 hours as needed pain.  bifidobacterium infantis (Align 4 mg oral capsule) 1 Capsules Oral (given by mouth) every day.  budesonide-formoterol (budesonide-formoterol 160 mcg-4.5 mcg/inh inhalation aerosol) 2 Puffs Inhale (breathe in) 2 times a day.  cholecalciferol (Vitamin D3) 1 Tabs Oral (given by mouth) every day.  colesevelam (Welchol 625 mg oral tablet) 3 Tabs Oral (given by mouth) 2 times a day.  denosumab - outpatient use only (Prolia 60 mg/mL subcutaneous solution) 1 Milliliter Subcutaneous (under the skin) every 6 months., Outpatient use only  dicyclomine (dicyclomine 20 mg oral tablet) 1 Tabs Oral (given by mouth) 4 times a day as needed PRN for 7 Days. gi distress.,  THIS MEDICATION IS ASSOCIATED  WITH  AN INCREASED RISK OF FALLS.  DULoxetine (Cymbalta 30 mg oral delayed release capsule) 1 Capsules Oral (given by mouth) At Bedtime (Once a Day). (do not crush or chew).  DULoxetine (Cymbalta 60 mg oral delayed release capsule) 1 Capsules Oral (given by mouth) once a day (in the morning). (do not crush or chew).  folic acid (folic acid 1 mg oral tablet) 1 Tabs Oral (given by mouth) every day.  hydroxychloroquine (Plaquenil Sulfate 200 mg oral tablet) 1 Tabs Oral (given by mouth) every day.  methotrexate (Rasuvo 25 mg/0.5 mL subcutaneous solution) 25 Milligram Subcutaneous (under the skin) every week. on Saturday.  omeprazole (omeprazole 20 mg oral delayed release capsule) 1 Capsules Oral (given by mouth) 2 times a day.  ondansetron (Zofran 4 mg oral tablet) 1 Tabs Oral (given by mouth) 3 times a day as needed nausea/vomiting.  predniSONE (predniSONE 20 mg oral tablet) 2 Tabs Oral (given by mouth) every day.  tiotropium (Spiriva 18 mcg inhalation capsule) 1 Capsules Inhale (breathe in) every day.  traZODone  (traZODone 50 mg oral tablet) 1 Tabs Oral (given by mouth) Once a Day (at bedtime)., DO NOT CRUSH  turmeric (turmeric 500 mg oral capsule) 1 Capsules Oral (given by mouth) every day.         Comment:       ORDERS          Order Name Order Details   Discharge Patient 01/08/18 6:52:00 EST

## 2018-07-10 ENCOUNTER — Ambulatory Visit: Admit: 2018-07-10 | Discharge: 2018-07-10 | Payer: MEDICARE | Attending: Internal Medicine | Primary: Internal Medicine

## 2018-07-10 ENCOUNTER — Ambulatory Visit: Attending: Internal Medicine | Primary: Internal Medicine

## 2018-07-10 DIAGNOSIS — R220 Localized swelling, mass and lump, head: Secondary | ICD-10-CM

## 2018-07-10 MED ORDER — DOXYCYCLINE 100 MG TAB
100 mg | ORAL_TABLET | Freq: Two times a day (BID) | ORAL | 0 refills | Status: AC
Start: 2018-07-10 — End: 2018-07-17

## 2018-07-10 NOTE — Progress Notes (Signed)
 ROOM # 1  Identified pt with two pt identifiers(name and DOB). Reviewed record in preparation for visit and have obtained necessary documentation.  Chief Complaint   Patient presents with   . Cyst     on rt side of face       [name] preferred language for health care discussion is english/other.  Is the patient using any DME equipment during OV? NO    [name] is due for:  Health Maintenance Due   Topic   . Hepatitis C Screening    . DTaP/Tdap/Td series (1 - Tdap)   . Shingrix Vaccine Age 70> (1 of 2)   . BREAST CANCER SCRN MAMMOGRAM    . FOBT Q 1 YEAR AGE 61-75    . GLAUCOMA SCREENING Q2Y    . Bone Densitometry (Dexa) Screening    . Pneumococcal 65+ years (1 of 2 - PCV13)     Health Maintenance reviewed and discussed per provider  Please order/place referral if appropriate.    Advance Directive:  1. Do you have an advance directive in place? Patient Reply: NO    2. If not, would you like material regarding how to put one in place?  NO    Coordination of Care:  1. Have you been to the ER, urgent care clinic since your last visit?  Hospitalized since your last visit? NO    2. Have you seen or consulted any other health care providers outside of the [facility] since your last visit? Include any pap smears or colon screening. NO    Patient is accompanied by self I have received verbal consent from Morgan Alexander to discuss any/all medical information while they are present in the room.    Learning Assessment:    Depression Screening:  3 most recent PHQ Screens 07/10/2018   Little interest or pleasure in doing things Not at all   Feeling down, depressed, irritable, or hopeless Not at all   Total Score PHQ 2 0     Abuse Screening:  n/i  Fall Risk  Fall Risk Assessment, last 12 mths 07/10/2018   Able to walk? Yes   Fall in past 12 months? No

## 2018-07-10 NOTE — Progress Notes (Signed)
Chief Complaint   Patient presents with   ??? Cyst     on rt side of face        HPI:     Morgan Alexander is a 67 y.o. Caucasian female with history of RA and COPD  here for the above complaint. She has cyst on right side of face for 2 months and it is getting bigger. There is some pain and no drainage. She goes hot to cold, but no fevers, chills, night sweats. It started out pea size and now marble size. She has on left eyelid and looks like hematoma. She denies any chest pain, abdominal pain, headaches. She has dizziness and shortness of breath. She has vertigo. She is taking rasuvo and orencia.   She has no teeth pain or swelling of gums or drainage from teeth.           Past Medical History:   Diagnosis Date   ??? Arthritis    ??? Asthma    ??? Chronic obstructive pulmonary disease ([place])    ??? Osteoporosis    ??? Rheumatoid arthritis ([place])      Past Surgical History:   Procedure Laterality Date   ??? IR PUNCTURE DRAIN OF LESION       Current Outpatient Medications   Medication Sig   ??? RASUVO, PF, 25 mg/0.5 mL atIn Once a week (injectable)   ??? ORENCIA CLICKJECT 762 mg/mL atIn Indications: TAke once a week   ??? SPIRIVA WITH HANDIHALER 18 mcg inhalation capsule Take 1 Cap by inhalation daily.   ??? SYMBICORT 160-4.5 mcg/actuation HFAA Take 2 Puffs by inhalation two (2) times a day.   ??? dicyclomine (BENTYL) 10 mg capsule Take 10 mg by mouth three (3) times daily.   ??? DULoxetine (CYMBALTA) 30 mg capsule Take 2 po in AM and 1 po at night   ??? hydroxychloroquine (PLAQUENIL) 200 mg tablet Take 200 mg by mouth daily.   ??? omeprazole (PRILOSEC) 20 mg capsule Take 20 mg by mouth two (2) times a day.   ??? traZODone (DESYREL) 50 mg tablet Take 50 mg by mouth nightly as needed for Sleep.   ??? doxycycline (ADOXA) 100 mg tablet Take 1 Tab by mouth two (2) times a day for 7 days.     No current facility-administered medications for this visit.      Health Maintenance   Topic Date Due   ??? Hepatitis C Screening  [birth year]   ??? DTaP/Tdap/Td series (1 -  Tdap) 10/13/1973   ??? Shingrix Vaccine Age 7> (1 of 2) 10/13/2002   ??? BREAST CANCER SCRN MAMMOGRAM  10/13/2002   ??? FOBT Q 1 YEAR AGE 61-75  10/13/2002   ??? GLAUCOMA SCREENING Q2Y  10/13/2017   ??? Bone Densitometry (Dexa) Screening  10/13/2017   ??? Pneumococcal 65+ years (1 of 2 - PCV13) 10/13/2017   ??? Influenza Age 64 to Adult  07/29/2018       There is no immunization history on file for this patient.  No LMP recorded (approximate). Patient is postmenopausal.        Allergies and Intolerances:   No Known Allergies    Family History:   History reviewed. No pertinent family history.    Social History:   She  reports that she has been smoking.  She has never used smokeless tobacco.  She  reports that she drank alcohol.              ??  OBJECTIVE:   Physical exam:   Visit Vitals  BP 117/76 (BP 1 Location: Left arm, BP Patient Position: Sitting)   Pulse 96   Temp 98.1 ??F (36.7 ??C) (Oral)   Resp 17   Ht 5\' 2"  (1.575 m)   Wt 113 lb (51.3 kg)   LMP  (Approximate)   SpO2 96%   BMI 20.67 kg/m??        Generally: Pleasant female in no acute distress  HEENT Exam: Mouth: Clear, no erythema or exudate  Right side of face just superior to gum line is a marble size mass that is indurated and not red to warm to touch. Feels like sebaceous cyst. Feels like in cheek and not coming from her tooth or gum.   Cardiac Exam: regular, rate, and rhythm. Normal S1 and S2. No murmurs, gallops, or rubs  Pulmonary exam: Clear to auscultation bilaterally    LABS/RADIOLOGICAL TESTS:    ASSESSMENT/PLAN:    1. Mass of face:  -     doxycycline (ADOXA) 100 mg tablet; Take 1 Tab by mouth two (2) times a day for 7 days.  -     REFERRAL TO DERMATOLOGY  - Use warm compresses twice a day.  - She was told if she does not hear about her appt. With dermatology in the next couple of days, then she should give them a call.       2.  If worsening pain, drainage, fevers, let us know ASAP. Pt was told.   Requested Prescriptions     Signed Prescriptions Disp Refills    ??? doxycycline (ADOXA) 100 mg tablet 14 Tab 0     Sig: Take 1 Tab by mouth two (2) times a day for 7 days.     3. Patient verbalized understanding and agreement with the plan.    4. Patient was given an after-visit summary.    5.   Follow-up and Dispositions    ?? Return if symptoms worsen or fail to improve  or sooner if worsening symptoms.               Lewie Loron, M.D.

## 2018-07-10 NOTE — Progress Notes (Signed)
Chief Complaint   Patient presents with   ??? Cyst     on rt side of face        HPI:     Morgan Alexander is a 67 y.o. Caucasian female with history of RA and COPD  here for the above complaint. She has cyst on right side of face for 2 months and it is getting bigger. There is some pain and no drainage. She goes hot to cold, but no fevers, chills, night sweats. It started out pea size and now marble size. She has on left eyelid and looks like hematoma. She denies any chest pain, abdominal pain, headaches. She has dizziness and shortness of breath. She has vertigo. She is taking rasuvo and orencia.   She has no teeth pain or swelling of gums or drainage from teeth.           Past Medical History:   Diagnosis Date   ??? Arthritis    ??? Asthma    ??? Chronic obstructive pulmonary disease ([place])    ??? Osteoporosis    ??? Rheumatoid arthritis ([place])      Past Surgical History:   Procedure Laterality Date   ??? IR PUNCTURE DRAIN OF LESION       Current Outpatient Medications   Medication Sig   ??? RASUVO, PF, 25 mg/0.5 mL atIn Once a week (injectable)   ??? ORENCIA CLICKJECT 962 mg/mL atIn Indications: TAke once a week   ??? SPIRIVA WITH HANDIHALER 18 mcg inhalation capsule Take 1 Cap by inhalation daily.   ??? SYMBICORT 160-4.5 mcg/actuation HFAA Take 2 Puffs by inhalation two (2) times a day.   ??? dicyclomine (BENTYL) 10 mg capsule Take 10 mg by mouth three (3) times daily.   ??? DULoxetine (CYMBALTA) 30 mg capsule Take 2 po in AM and 1 po at night   ??? hydroxychloroquine (PLAQUENIL) 200 mg tablet Take 200 mg by mouth daily.   ??? omeprazole (PRILOSEC) 20 mg capsule Take 20 mg by mouth two (2) times a day.   ??? traZODone (DESYREL) 50 mg tablet Take 50 mg by mouth nightly as needed for Sleep.   ??? doxycycline (ADOXA) 100 mg tablet Take 1 Tab by mouth two (2) times a day for 7 days.     No current facility-administered medications for this visit.      Health Maintenance   Topic Date Due   ??? Hepatitis C Screening  December 02, 1951    ??? DTaP/Tdap/Td series (1 - Tdap) 10/13/1973   ??? Shingrix Vaccine Age 35> (1 of 2) 10/13/2002   ??? BREAST CANCER SCRN MAMMOGRAM  10/13/2002   ??? FOBT Q 1 YEAR AGE 44-75  10/13/2002   ??? GLAUCOMA SCREENING Q2Y  10/13/2017   ??? Bone Densitometry (Dexa) Screening  10/13/2017   ??? Pneumococcal 65+ years (1 of 2 - PCV13) 10/13/2017   ??? Influenza Age 47 to Adult  07/29/2018       There is no immunization history on file for this patient.  No LMP recorded (approximate). Patient is postmenopausal.        Allergies and Intolerances:   No Known Allergies    Family History:   History reviewed. No pertinent family history.    Social History:   She  reports that she has been smoking.  She has never used smokeless tobacco.  She  reports that she drank alcohol.            ??     OBJECTIVE:  Physical exam:   Visit Vitals  BP 117/76 (BP 1 Location: Left arm, BP Patient Position: Sitting)   Pulse 96   Temp 98.1 ??F (36.7 ??C) (Oral)   Resp 17   Ht 5\' 2"  (1.575 m)   Wt 113 lb (51.3 kg)   LMP  (Approximate)   SpO2 96%   BMI 20.67 kg/m??        Generally: Pleasant female in no acute distress  HEENT Exam: Mouth: Clear, no erythema or exudate  Right side of face just superior to gum line is a marble size mass that is indurated and not red to warm to touch. Feels like sebaceous cyst. Feels like in cheek and not coming from her tooth or gum.   Cardiac Exam: regular, rate, and rhythm. Normal S1 and S2. No murmurs, gallops, or rubs  Pulmonary exam: Clear to auscultation bilaterally  LABS/RADIOLOGICAL TESTS:    ASSESSMENT/PLAN:    1. Mass of face:  -     doxycycline (ADOXA) 100 mg tablet; Take 1 Tab by mouth two (2) times a day for 7 days.  -     REFERRAL TO DERMATOLOGY  - Use warm compresses twice a day.  - She was told if she does not hear about her appt. With dermatology in the next couple of days, then she should give them a call.       2.  If worsening pain, drainage, fevers, let us know ASAP. Pt was told.   Requested Prescriptions      Signed Prescriptions Disp Refills   ??? doxycycline (ADOXA) 100 mg tablet 14 Tab 0     Sig: Take 1 Tab by mouth two (2) times a day for 7 days.     3. Patient verbalized understanding and agreement with the plan.    4. Patient was given an after-visit summary.    5.   Follow-up and Dispositions    ?? Return if symptoms worsen or fail to improve  or sooner if worsening symptoms.               Lewie Loron, M.D.

## 2018-07-10 NOTE — Patient Instructions (Addendum)
1) Use warm compresses twice day.     2) If worsening pain, drainage, fevers, let us know ASAP.     3) Follow-up as needed or sooner if worsening symptoms.              Epidermoid Cyst: Care Instructions  Your Care Instructions  An epidermoid (say "eh-pih-DER-moyd") cyst is a lump just under the skin. These cysts can form when a hair follicle becomes blocked. They are common in acne and may occur on the face, neck, back, and genitals. However, they can form anywhere on the body. These cysts are not cancer and do not lead to cancer. They tend not to hurt, but they can sometimes become swollen and painful. They also may break open (rupture) and cause scarring.  These cysts sometimes do not cause problems and may not need treatment. If you have a cyst that is swollen and hurts, your doctor may inject it with a medicine to help it heal. But it is more likely that a painful cyst will need to be removed. Your doctor will give you a shot of numbing medicine and cut into the cyst to drain it or remove it. This makes the symptoms go away.  Follow-up care is a key part of your treatment and safety. Be sure to make and go to all appointments, and call your doctor if you are having problems. It's also a good idea to know your test results and keep a list of the medicines you take.  How can you care for yourself at home?  ?? Do not squeeze the cyst or poke it with a needle to open it. This can cause swelling, redness, and infection.  ?? Always have a doctor look at any new lumps you get to make sure that they are not serious.  When should you call for help?  Watch closely for changes in your health, and be sure to contact your doctor if:  ?? ?? You have a fever, redness, or swelling after you get a shot of medicine in the cyst.   ?? ?? You see or feel a new lump on your skin.   Where can you learn more?  Go to [email or web address].  Enter [phone] in the search box to learn more about "Epidermoid Cyst: Care  Instructions."  Current as of: April 14, 2017  Content Version: 11.9  ?? 2006-2018 [employer]. Care instructions adapted under license by [employer] (which disclaims liability or warranty for this information). If you have questions about a medical condition or this instruction, always ask your healthcare professional. [place] any warranty or liability for your use of this information.

## 2018-07-10 NOTE — Progress Notes (Signed)
ROOM # 1  Identified pt with two pt identifiers(name and DOB). Reviewed record in preparation for visit and have obtained necessary documentation.  Chief Complaint   Patient presents with   ??? Cyst     on rt side of face       Morgan Alexander preferred language for health care discussion is english/other.  Is the patient using any DME equipment during OV? NO    Morgan Alexander is due for:  Health Maintenance Due   Topic   ??? Hepatitis C Screening    ??? DTaP/Tdap/Td series (1 - Tdap)   ??? Shingrix Vaccine Age 50> (1 of 2)   ??? BREAST CANCER SCRN MAMMOGRAM    ??? FOBT Q 1 YEAR AGE 66-75    ??? GLAUCOMA SCREENING Q2Y    ??? Bone Densitometry (Dexa) Screening    ??? Pneumococcal 65+ years (1 of 2 - PCV13)     Health Maintenance reviewed and discussed per provider  Please order/place referral if appropriate.    Advance Directive:  1. Do you have an advance directive in place? Patient Reply: NO    2. If not, would you like material regarding how to put one in place?  NO    Coordination of Care:  1. Have you been to the ER, urgent care clinic since your last visit?  Hospitalized since your last visit? NO    2. Have you seen or consulted any other health care providers outside of the [facility] since your last visit? Include any pap smears or colon screening. NO    Patient is accompanied by self I have received verbal consent from [name] to discuss any/all medical information while they are present in the room.    Learning Assessment:    Depression Screening:  3 most recent PHQ Screens 07/10/2018   Little interest or pleasure in doing things Not at all   Feeling down, depressed, irritable, or hopeless Not at all   Total Score PHQ 2 0     Abuse Screening:  n/i  Fall Risk  Fall Risk Assessment, last 12 mths 07/10/2018   Able to walk? Yes   Fall in past 12 months? No

## 2018-07-29 DIAGNOSIS — R7303 Prediabetes: Secondary | ICD-10-CM | POA: Insufficient documentation

## 2018-08-03 ENCOUNTER — Ambulatory Visit: Admit: 2018-08-03 | Discharge: 2018-08-03 | Payer: MEDICARE | Attending: Internal Medicine | Primary: Internal Medicine

## 2018-08-03 ENCOUNTER — Ambulatory Visit: Attending: Internal Medicine | Primary: Internal Medicine

## 2018-08-03 DIAGNOSIS — M069 Rheumatoid arthritis, unspecified: Secondary | ICD-10-CM

## 2018-08-03 NOTE — Progress Notes (Signed)
Chief Complaint   Patient presents with   ??? Establish Care         HPI:     Morgan Alexander is a 67 y.o. Caucasian female with history of asthma, COPD and RA here for the above complaint.  She saw the dermatology. She said the mas has gone down and was given doxycycline. Dermatology thought it was the tooth. She has chest pain, shortness of breath, abdominal pain, headaches or dizziness. Dermatology does not think it is a sebaceous cyst, but thinks it is more tooth related.     Prolia injection due 09/2018.     When she wakes up in the AM and she gets nauseated and abdominal pain. She has to take zofran and it helps. She has a malabsorption problem.       Past Medical History:   Diagnosis Date   ??? Arthritis    ??? Asthma    ??? Chronic obstructive pulmonary disease ([place])    ??? Osteoporosis    ??? Rheumatoid arthritis ([place])        Past Surgical History:   Procedure Laterality Date   ??? IR PUNCTURE DRAIN OF LESION             MEDICATION ALLERGIES/INTOLERANCES:   No Known Allergies          CURRENT MEDICATIONS:    Current Outpatient Medications   Medication Sig   ??? colesevelam (WELCHOL) 625 mg tablet Take 1,875 mg by mouth two (2) times daily (with meals).   ??? OTHER Indications: align probiotic daily   ??? RASUVO, PF, 25 mg/0.5 mL atIn Once a week (injectable)   ??? ORENCIA CLICKJECT 132 mg/mL atIn Indications: TAke once a week   ??? SPIRIVA WITH HANDIHALER 18 mcg inhalation capsule Take 1 Cap by inhalation daily.   ??? SYMBICORT 160-4.5 mcg/actuation HFAA Take 2 Puffs by inhalation two (2) times a day.   ??? dicyclomine (BENTYL) 10 mg capsule Take 10 mg by mouth three (3) times daily.   ??? DULoxetine (CYMBALTA) 30 mg capsule Take 2 po in AM and 1 po at night   ??? hydroxychloroquine (PLAQUENIL) 200 mg tablet Take 200 mg by mouth daily.   ??? omeprazole (PRILOSEC) 20 mg capsule Take 20 mg by mouth two (2) times a day.   ??? traZODone (DESYREL) 50 mg tablet Take 50 mg by mouth nightly as needed for Sleep.     No current facility-administered  medications for this visit.        Health Maintenance   Topic Date Due   ??? Hepatitis C Screening  06-27-1951   ??? DTaP/Tdap/Td series (1 - Tdap) 10/13/1973   ??? Shingrix Vaccine Age 55> (1 of 2) 10/13/2002   ??? BREAST CANCER SCRN MAMMOGRAM  10/13/2002   ??? FOBT Q 1 YEAR AGE 72-75  10/13/2002   ??? GLAUCOMA SCREENING Q2Y  10/13/2017   ??? Bone Densitometry (Dexa) Screening  10/13/2017   ??? Pneumococcal 65+ years (1 of 2 - PCV13) 10/13/2017   ??? Influenza Age 50 to Adult  07/29/2018   ??? MEDICARE YEARLY EXAM  07/10/2018         FAMILY HISTORY:   History reviewed. No pertinent family history.    SOCIAL HISTORY:   She  reports that she has been smoking. She has never used smokeless tobacco.  She  reports that she drank alcohol.            OBJECTIVE:  PHYSICAL EXAM: Vitals:   Vitals:  08/03/18 1005   BP: 118/85   Pulse: 97   Resp: 18   Temp: 97.9 ??F (36.6 ??C)   TempSrc: Oral   SpO2: 92%   Weight: 111 lb (50.3 kg)   Height: 5\' 2"  (1.575 m)     Generally: Pleasant female in no acute distress    HEENT exam: Head: atraumatic     Mouth: pea size mass in the cheek area. Her teeth and gums look find and no signs of infection.     Cardiac exam: regular, rate, and rhythm. No murmurs, gallops, or rubs. Normal S1 and S2.    Pulmonary exam: Clear to ausculation bilaterally    Abdominal exam: Positive bowel sounds in all four quadrants, soft, nondistended, nontender. No hepatosplenomegaly.    Extremities: 2+ dorsalis pedis bilaterally. No pedal edema bilaterally.             LABS/RADIOLOGICAL TESTS:   none      ASSESSMENT/PLAN:      ICD-10-CM ICD-9-CM    1. Rheumatoid arthritis, involving unspecified site, unspecified rheumatoid factor presence (HCC) M06.9 714.0 REFERRAL TO RHEUMATOLOGY      CBC W/O DIFF      METABOLIC PANEL, COMPREHENSIVE   2. Chronic obstructive pulmonary disease, unspecified COPD type ([place]) J44.9 496 REFERRAL TO PULMONARY DISEASE   3. Vitamin D deficiency E55.9 268.9 VITAMIN D, 25 HYDROXY   4. Diabetes mellitus screening  Z13.1 V77.1 HEMOGLOBIN A1C WITH EAG   5. Thyroid disorder screening Z13.29 V77.0 TSH 3RD GENERATION   6. Lipid screening Z13.220 V77.91 LIPID PANEL   7. Abnormal urine R82.90 791.9 URINALYSIS W/ RFLX MICROSCOPIC   8. Nausea R11.0 787.02 AMYLASE      LIPASE   9. Patient verbalized understanding and agreement with the plan.    10. Patient was given after visit summary.    11.   Follow-up and Dispositions    ?? Return in about 1 month (around 09/03/2018) for f/u COPD  or sooner if worsening symptoms.             Lewie Loron, MD

## 2018-08-03 NOTE — Progress Notes (Signed)
 ROOM # 1  Identified pt with two pt identifiers(name and DOB). Reviewed record in preparation for visit and have obtained necessary documentation.  Chief Complaint   Patient presents with   . Establish Care      Candis Buff preferred language for health care discussion is english/other.  Is the patient using any DME equipment during OV? NO    Gilberto Streck is due for:  Health Maintenance Due   Topic   . Hepatitis C Screening    . DTaP/Tdap/Td series (1 - Tdap)   . Shingrix Vaccine Age 66> (1 of 2)   . BREAST CANCER SCRN MAMMOGRAM    . FOBT Q 1 YEAR AGE 62-75    . GLAUCOMA SCREENING Q2Y    . Bone Densitometry (Dexa) Screening    . Pneumococcal 65+ years (1 of 2 - PCV13)   . Influenza Age 36 to Adult    . MEDICARE YEARLY EXAM      Health Maintenance reviewed and discussed per provider  Please order/place referral if appropriate.    Advance Directive:  1. Do you have an advance directive in place? Patient Reply: NO    2. If not, would you like material regarding how to put one in place?  NO    Coordination of Care:  1. Have you been to the ER, urgent care clinic since your last visit?  Hospitalized since your last visit? NO    2. Have you seen or consulted any other health care providers outside of the [facility] since your last visit? Include any pap smears or colon screening. NO    Patient is accompanied by self I have received verbal consent from Candis Buff to discuss any/all medical information while they are present in the room.    Learning Assessment:    Depression Screening:  3 most recent PHQ Screens 07/10/2018   Little interest or pleasure in doing things Not at all   Feeling down, depressed, irritable, or hopeless Not at all   Total Score PHQ 2 0     Abuse Screening:    Fall Risk  Fall Risk Assessment, last 12 mths 07/10/2018   Able to walk? Yes   Fall in past 12 months? No

## 2018-08-03 NOTE — Progress Notes (Signed)
Result letter generated on 08/09/18 and mailed to pt.

## 2018-08-03 NOTE — Progress Notes (Signed)
ROOM # 1  Identified pt with two pt identifiers(name and DOB). Reviewed record in preparation for visit and have obtained necessary documentation.  Chief Complaint   Patient presents with   ??? [place] preferred language for health care discussion is english/other.  Is the patient using any DME equipment during OV? NO    Morgan Alexander is due for:  Health Maintenance Due   Topic   ??? Hepatitis C Screening    ??? DTaP/Tdap/Td series (1 - Tdap)   ??? Shingrix Vaccine Age 12> (1 of 2)   ??? BREAST CANCER SCRN MAMMOGRAM    ??? FOBT Q 1 YEAR AGE 72-75    ??? GLAUCOMA SCREENING Q2Y    ??? Bone Densitometry (Dexa) Screening    ??? Pneumococcal 65+ years (1 of 2 - PCV13)   ??? Influenza Age 42 to Adult    ??? [place] Maintenance reviewed and discussed per provider  Please order/place referral if appropriate.    Advance Directive:  1. Do you have an advance directive in place? Patient Reply: NO    2. If not, would you like material regarding how to put one in place?  NO    Coordination of Care:  1. Have you been to the ER, urgent care clinic since your last visit?  Hospitalized since your last visit? NO    2. Have you seen or consulted any other health care providers outside of the [facility] since your last visit? Include any pap smears or colon screening. NO    Patient is accompanied by self I have received verbal consent from Morgan Alexander to discuss any/all medical information while they are present in the room.    Learning Assessment:    Depression Screening:  3 most recent PHQ Screens 07/10/2018   Little interest or pleasure in doing things Not at all   Feeling down, depressed, irritable, or hopeless Not at all   Total Score PHQ 2 0     Abuse Screening:    Fall Risk  Fall Risk Assessment, last 12 mths 07/10/2018   Able to walk? Yes   Fall in past 12 months? No

## 2018-08-03 NOTE — Progress Notes (Signed)
Chief Complaint   Patient presents with   ??? Establish Care         HPI:     Morgan Alexander is a 67 y.o. Caucasian female with history of asthma, COPD and RA here for the above complaint.  She saw the dermatology. She said the mas has gone down and was given doxycycline. Dermatology thought it was the tooth. She has chest pain, shortness of breath, abdominal pain, headaches or dizziness. Dermatology does not think it is a sebaceous cyst, but thinks it is more tooth related.     Prolia injection due 09/2018.     When she wakes up in the AM and she gets nauseated and abdominal pain. She has to take zofran and it helps. She has a malabsorption problem.       Past Medical History:   Diagnosis Date   ??? Arthritis    ??? Asthma    ??? Chronic obstructive pulmonary disease ([place])    ??? Osteoporosis    ??? Rheumatoid arthritis ([place])        Past Surgical History:   Procedure Laterality Date   ??? IR PUNCTURE DRAIN OF LESION             MEDICATION ALLERGIES/INTOLERANCES:   No Known Allergies          CURRENT MEDICATIONS:    Current Outpatient Medications   Medication Sig   ??? colesevelam (WELCHOL) 625 mg tablet Take 1,875 mg by mouth two (2) times daily (with meals).   ??? OTHER Indications: align probiotic daily   ??? RASUVO, PF, 25 mg/0.5 mL atIn Once a week (injectable)   ??? ORENCIA CLICKJECT 301 mg/mL atIn Indications: TAke once a week   ??? SPIRIVA WITH HANDIHALER 18 mcg inhalation capsule Take 1 Cap by inhalation daily.   ??? SYMBICORT 160-4.5 mcg/actuation HFAA Take 2 Puffs by inhalation two (2) times a day.   ??? dicyclomine (BENTYL) 10 mg capsule Take 10 mg by mouth three (3) times daily.   ??? DULoxetine (CYMBALTA) 30 mg capsule Take 2 po in AM and 1 po at night   ??? hydroxychloroquine (PLAQUENIL) 200 mg tablet Take 200 mg by mouth daily.   ??? omeprazole (PRILOSEC) 20 mg capsule Take 20 mg by mouth two (2) times a day.   ??? traZODone (DESYREL) 50 mg tablet Take 50 mg by mouth nightly as needed for Sleep.      No current facility-administered medications for this visit.        Health Maintenance   Topic Date Due   ??? Hepatitis C Screening  1951-07-25   ??? DTaP/Tdap/Td series (1 - Tdap) 10/13/1973   ??? Shingrix Vaccine Age 70> (1 of 2) 10/13/2002   ??? BREAST CANCER SCRN MAMMOGRAM  10/13/2002   ??? FOBT Q 1 YEAR AGE 61-75  10/13/2002   ??? GLAUCOMA SCREENING Q2Y  10/13/2017   ??? Bone Densitometry (Dexa) Screening  10/13/2017   ??? Pneumococcal 65+ years (1 of 2 - PCV13) 10/13/2017   ??? Influenza Age 62 to Adult  07/29/2018   ??? MEDICARE YEARLY EXAM  07/10/2018         FAMILY HISTORY:   History reviewed. No pertinent family history.    SOCIAL HISTORY:   She  reports that she has been smoking. She has never used smokeless tobacco.  She  reports that she drank alcohol.            OBJECTIVE:  PHYSICAL EXAM: Vitals:   Vitals:  08/03/18 1005   BP: 118/85   Pulse: 97   Resp: 18   Temp: 97.9 ??F (36.6 ??C)   TempSrc: Oral   SpO2: 92%   Weight: 111 lb (50.3 kg)   Height: 5\' 2"  (1.575 m)     Generally: Pleasant female in no acute distress    HEENT exam: Head: atraumatic     Mouth: pea size mass in the cheek area. Her teeth and gums look find and no signs of infection.     Cardiac exam: regular, rate, and rhythm. No murmurs, gallops, or rubs. Normal S1 and S2.    Pulmonary exam: Clear to ausculation bilaterally    Abdominal exam: Positive bowel sounds in all four quadrants, soft, nondistended, nontender. No hepatosplenomegaly.    Extremities: 2+ dorsalis pedis bilaterally. No pedal edema bilaterally.             LABS/RADIOLOGICAL TESTS:   none      ASSESSMENT/PLAN:      ICD-10-CM ICD-9-CM    1. Rheumatoid arthritis, involving unspecified site, unspecified rheumatoid factor presence (HCC) M06.9 714.0 REFERRAL TO RHEUMATOLOGY      CBC W/O DIFF      METABOLIC PANEL, COMPREHENSIVE   2. Chronic obstructive pulmonary disease, unspecified COPD type ([place]) J44.9 496 REFERRAL TO PULMONARY DISEASE   3. Vitamin D deficiency E55.9 268.9 VITAMIN D, 25 HYDROXY    4. Diabetes mellitus screening Z13.1 V77.1 HEMOGLOBIN A1C WITH EAG   5. Thyroid disorder screening Z13.29 V77.0 TSH 3RD GENERATION   6. Lipid screening Z13.220 V77.91 LIPID PANEL   7. Abnormal urine R82.90 791.9 URINALYSIS W/ RFLX MICROSCOPIC   8. Nausea R11.0 787.02 AMYLASE      LIPASE   9. Patient verbalized understanding and agreement with the plan.    10. Patient was given after visit summary.    11.   Follow-up and Dispositions    ?? Return in about 1 month (around 09/03/2018) for f/u COPD  or sooner if worsening symptoms.             Lewie Loron, MD

## 2018-08-03 NOTE — Patient Instructions (Signed)
1) Follow-up in 1 month or sooner if worsening symptoms.

## 2018-08-04 LAB — COMPREHENSIVE METABOLIC PANEL
ALT: 11 IU/L (ref 0–32)
AST: 19 IU/L (ref 0–40)
Albumin/Globulin Ratio: 1.4 NA (ref 1.2–2.2)
Albumin: 4.2 g/dL (ref 3.6–4.8)
Alkaline Phosphatase: 88 IU/L (ref 39–117)
BUN: 15 mg/dL (ref 8–27)
Bun/Cre Ratio: 24 NA (ref 12–28)
CO2: 25 mmol/L (ref 20–29)
Calcium: 9.4 mg/dL (ref 8.7–10.3)
Chloride: 101 mmol/L (ref 96–106)
Creatinine: 0.63 mg/dL (ref 0.57–1.00)
EGFR IF NonAfrican American: 94 mL/min/{1.73_m2} (ref >59)
GFR African American: 109 mL/min/{1.73_m2} (ref >59)
Globulin, Total: 3.1 g/dL (ref 1.5–4.5)
Glucose: 72 mg/dL (ref 65–99)
Potassium: 4.1 mmol/L (ref 3.5–5.2)
Sodium: 142 mmol/L (ref 134–144)
Total Bilirubin: <0.2 mg/dL (ref 0.0–1.2)
Total Protein: 7.3 g/dL (ref 6.0–8.5)

## 2018-08-04 LAB — CBC
Hematocrit: 40.7 % (ref 34.0–46.6)
Hemoglobin: 12.9 g/dL (ref 11.1–15.9)
MCH: 29.1 pg (ref 26.6–33.0)
MCHC: 31.7 g/dL (ref 31.5–35.7)
MCV: 92 fL (ref 79–97)
Platelets: 350 10*3/uL (ref 150–450)
RBC: 4.44 x10E6/uL (ref 3.77–5.28)
RDW: 15.7 % — ABNORMAL HIGH (ref 12.3–15.4)
WBC: 10.6 10*3/uL (ref 3.4–10.8)

## 2018-08-04 LAB — LIPID PANEL
Cholesterol, Total: 178 mg/dL (ref 100–199)
Cholesterol, total: 178 mg/dL (ref 100–199)
HDL Cholesterol: 63 mg/dL (ref >39)
HDL: 63 mg/dL (ref >39)
LDL Calculated: 98 mg/dL (ref 0–99)
LDL, calculated: 98 mg/dL (ref 0–99)
Triglyceride: 86 mg/dL (ref 0–149)
Triglycerides: 86 mg/dL (ref 0–149)
VLDL Cholesterol Calculated: 17 mg/dL (ref 5–40)
VLDL, calculated: 17 mg/dL (ref 5–40)

## 2018-08-04 LAB — CVD REPORT

## 2018-08-04 LAB — URINALYSIS W/ RFLX MICROSCOPIC
Bilirubin, Urine: NEGATIVE
Bilirubin: NEGATIVE
Blood, Urine: NEGATIVE
Blood: NEGATIVE
Glucose, UA: NEGATIVE
Glucose: NEGATIVE
Ketone: NEGATIVE
Ketones, Urine: NEGATIVE
Leukocyte Esterase, Urine: NEGATIVE
Leukocyte Esterase: NEGATIVE
Nitrite, Urine: NEGATIVE
Nitrites: NEGATIVE
Specific Gravity, UA: 1.022 NA (ref 1.005–1.030)
Specific Gravity: 1.022 (ref 1.005–1.030)
Urobilinogen, Urine: 0.2 mg/dL (ref 0.2–1.0)
Urobilinogen: 0.2 mg/dL (ref 0.2–1.0)
pH (UA): 5.5 (ref 5.0–7.5)
pH, UA: 5.5 NA (ref 5.0–7.5)

## 2018-08-04 LAB — LIPASE
Lipase: 24 U/L (ref 14–72)
Lipase: 24 U/L (ref 14–72)

## 2018-08-04 LAB — MICROSCOPIC EXAMINATION
BACTERIA, URINE: NONE SEEN
Bacteria: NONE SEEN
Casts UA: NONE SEEN /lpf
Casts: NONE SEEN /lpf
Epithelial Cells, UA: NONE SEEN /hpf (ref 0–10)
Epithelial cells: NONE SEEN /hpf (ref 0–10)

## 2018-08-04 LAB — TSH 3RD GENERATION
TSH: 3.24 u[IU]/mL (ref 0.450–4.500)
TSH: 3.24 u[IU]/mL (ref 0.450–4.500)

## 2018-08-04 LAB — HEMOGLOBIN A1C W/EAG
Hemoglobin A1C: 5.8 % — ABNORMAL HIGH (ref 4.8–5.6)
eAG: 120 mg/dL

## 2018-08-04 LAB — VITAMIN D 25 HYDROXY: Vit D, 25-Hydroxy: 48.8 ng/mL (ref 30.0–100.0)

## 2018-08-04 LAB — AMYLASE
Amylase: 90 U/L (ref 31–124)
Amylase: 90 U/L (ref 31–124)

## 2018-08-04 LAB — CBC W/O DIFF
HCT: 40.7 % (ref 34.0–46.6)
HGB: 12.9 g/dL (ref 11.1–15.9)
MCH: 29.1 pg (ref 26.6–33.0)
MCHC: 31.7 g/dL (ref 31.5–35.7)
MCV: 92 fL (ref 79–97)
PLATELET: 350 10*3/uL (ref 150–450)
RBC: 4.44 x10E6/uL (ref 3.77–5.28)
RDW: 15.7 % — ABNORMAL HIGH (ref 12.3–15.4)
WBC: 10.6 10*3/uL (ref 3.4–10.8)

## 2018-08-04 LAB — METABOLIC PANEL, COMPREHENSIVE
A-G Ratio: 1.4 (ref 1.2–2.2)
ALT (SGPT): 11 IU/L (ref 0–32)
AST (SGOT): 19 IU/L (ref 0–40)
Albumin: 4.2 g/dL (ref 3.6–4.8)
Alk. phosphatase: 88 IU/L (ref 39–117)
BUN/Creatinine ratio: 24 (ref 12–28)
BUN: 15 mg/dL (ref 8–27)
Bilirubin, total: <0.2 mg/dL (ref 0.0–1.2)
CO2: 25 mmol/L (ref 20–29)
Calcium: 9.4 mg/dL (ref 8.7–10.3)
Chloride: 101 mmol/L (ref 96–106)
Creatinine: 0.63 mg/dL (ref 0.57–1.00)
GFR est AA: 109 mL/min/{1.73_m2} (ref >59)
GFR est non-AA: 94 mL/min/{1.73_m2} (ref >59)
GLOBULIN, TOTAL: 3.1 g/dL (ref 1.5–4.5)
Glucose: 72 mg/dL (ref 65–99)
Potassium: 4.1 mmol/L (ref 3.5–5.2)
Protein, total: 7.3 g/dL (ref 6.0–8.5)
Sodium: 142 mmol/L (ref 134–144)

## 2018-08-04 LAB — HEMOGLOBIN A1C WITH EAG
Estimated average glucose: 120 mg/dL
Hemoglobin A1c: 5.8 % — ABNORMAL HIGH (ref 4.8–5.6)

## 2018-08-04 LAB — VITAMIN D, 25 HYDROXY: VITAMIN D, 25-HYDROXY: 48.8 ng/mL (ref 30.0–100.0)

## 2018-08-13 ENCOUNTER — Telehealth

## 2018-08-13 MED ORDER — DULOXETINE 60 MG CAP, DELAYED RELEASE
60 mg | ORAL_CAPSULE | Freq: Every day | ORAL | 3 refills | Status: DC
Start: 2018-08-13 — End: 2019-08-16

## 2018-08-13 MED ORDER — DULOXETINE 30 MG CAP, DELAYED RELEASE
30 mg | ORAL_CAPSULE | Freq: Every evening | ORAL | 3 refills | Status: DC
Start: 2018-08-13 — End: 2019-08-16

## 2018-08-13 MED ORDER — DULOXETINE 30 MG CAP, DELAYED RELEASE
30 mg | ORAL_CAPSULE | ORAL | 3 refills | Status: DC
Start: 2018-08-13 — End: 2018-08-13

## 2018-08-13 MED ORDER — ONDANSETRON 4 MG TAB, RAPID DISSOLVE
4 mg | ORAL_TABLET | Freq: Three times a day (TID) | ORAL | 3 refills | Status: AC | PRN
Start: 2018-08-13 — End: ?

## 2018-08-13 MED ORDER — TRAZODONE 50 MG TAB
50 mg | ORAL_TABLET | Freq: Every evening | ORAL | 1 refills | Status: DC | PRN
Start: 2018-08-13 — End: 2019-03-28

## 2018-08-13 NOTE — Telephone Encounter (Signed)
1) If you have not heard from rheumatology, pulmonary and dermatology, please give them a call. Does she need contact information?    2) Referral generated in connect care for GI. If she does not hear about her appt. In 1 week, she needs to give them a call. Dr. Lu Duffel: [phone]. Does she also want to do abd/pevis CT?    3) Sent electronically:    .  Requested Prescriptions     Signed Prescriptions Disp Refills   ??? DULoxetine (CYMBALTA) 60 mg capsule 90 Cap 3     Sig: Take 1 Cap by mouth daily.     Authorizing Provider: Lewie Loron T   ??? DULoxetine (CYMBALTA) 30 mg capsule 90 Cap 3     Sig: Take 1 Cap by mouth nightly.     Authorizing Provider: Lewie Loron T   ??? ondansetron (ZOFRAN ODT) 4 mg disintegrating tablet 30 Tab 3     Sig: Take 1 Tab by mouth every eight (8) hours as needed for Nausea (vomiting).     Authorizing Provider: Ples Specter

## 2018-08-13 NOTE — Telephone Encounter (Signed)
-----   Message from Raphael Gibney, LPN sent at 06/01/5408  9:41 AM EDT -----  Regarding: FW: Prescription Question  Contact: [phone]      ----- Message -----  From: [name]  Sent: 08/12/2018   1:55 PM EDT  To: [facility] Nurse Pool  Subject: Prescription Question                            I am needing refills for the following medications:  Zofran 4mg  (urgent)  Cymbalta 60mg   Cymbalta 30mg        (I have been taking 90mg  per day 60 mg               morning and 30mg  before bedtime).  Trazadone 50mg     [facility]  [address]  [place] [address]  [phone]     Let me know when I should schedule the appointments for the Rheumatologist and Pulmonary doctors or if they will contact me.    Please provide a referral to a GI doctor- the nausea and vomiting are not improving. I have little appetite.    Thank you.

## 2018-08-13 NOTE — Telephone Encounter (Signed)
Pt sent mychart message requesting these medications.

## 2018-08-16 NOTE — Telephone Encounter (Signed)
Attempted to contact pt at hm number, no answer. Lvm for pt to return call to office at [phone] . Will continue to try to contact pt.

## 2018-08-16 NOTE — Telephone Encounter (Signed)
Spoke with patient and she has not heard anything from the rheum or pulmonary . I advised patient that the referral will be done today and the referral coordinator will call her with the names and phone numbers of the drs. Patient is requesting script for trazdone 50mg  for sleep.

## 2018-08-16 NOTE — Telephone Encounter (Signed)
Patient is returning a call to the office.

## 2018-08-17 NOTE — Telephone Encounter (Signed)
I already sent rx for trazodone on 08/13/18.

## 2018-08-19 NOTE — Telephone Encounter (Signed)
Attempted to contact patient at hm number, no answer. Lvm for patient to return call to office at [phone]. Will continue to try to contact patient.

## 2018-08-19 NOTE — Telephone Encounter (Signed)
Patient is calling back, states she has already picked the Rx up.

## 2018-08-23 ENCOUNTER — Encounter

## 2018-09-02 ENCOUNTER — Inpatient Hospital Stay: Admit: 2018-09-02 | Payer: MEDICARE | Attending: Gastroenterology | Primary: Internal Medicine

## 2018-09-02 DIAGNOSIS — R110 Nausea: Secondary | ICD-10-CM

## 2018-09-02 DIAGNOSIS — R112 Nausea with vomiting, unspecified: Secondary | ICD-10-CM

## 2018-09-02 LAB — AMB POC CREATININE
Creatinine, POC: 0.5 MG/DL — ABNORMAL LOW (ref 0.6–1.3)
GFR African American: >60 mL/min/{1.73_m2} (ref >60)
GFR Non-African American: >60 mL/min/{1.73_m2} (ref >60)

## 2018-09-02 LAB — CREATININE, POC
Creatinine, POC: 0.5 MG/DL — ABNORMAL LOW (ref 0.6–1.3)
GFRAA, POC: >60 mL/min/{1.73_m2} (ref >60)
GFRNA, POC: >60 mL/min/{1.73_m2} (ref >60)

## 2018-09-02 MED ORDER — IOHEXOL 240 MG/ML IV SOLN
240 mg iodine/mL | Freq: Once | INTRAVENOUS | Status: AC
Start: 2018-09-02 — End: 2018-09-02
  Administered 2018-09-02: 20:00:00 via ORAL

## 2018-09-02 MED ORDER — IOPAMIDOL 61 % IV SOLN
300 mg iodine /mL (61 %) | Freq: Once | INTRAVENOUS | Status: AC
Start: 2018-09-02 — End: 2018-09-02
  Administered 2018-09-02: 20:00:00 via INTRAVENOUS

## 2018-09-02 MED ORDER — TECHNETIUM TC 99M SULFUR COLLOID
Freq: Once | Status: AC
Start: 2018-09-02 — End: 2018-09-02
  Administered 2018-09-02: 16:00:00 via ORAL

## 2018-09-02 MED FILL — OMNIPAQUE 240 MG IODINE/ML INTRAVENOUS SOLUTION: 240 mg iodine/mL | INTRAVENOUS | Qty: 50

## 2018-09-02 MED FILL — ISOVUE-300  61 % INTRAVENOUS SOLUTION: 300 mg iodine /mL (61 %) | INTRAVENOUS | Qty: 100

## 2018-09-03 ENCOUNTER — Inpatient Hospital Stay: Payer: MEDICARE | Attending: Gastroenterology | Primary: Internal Medicine

## 2018-09-07 ENCOUNTER — Ambulatory Visit: Admit: 2018-09-07 | Discharge: 2018-09-07 | Payer: MEDICARE | Attending: Internal Medicine | Primary: Internal Medicine

## 2018-09-07 ENCOUNTER — Ambulatory Visit: Attending: Internal Medicine | Primary: Internal Medicine

## 2018-09-07 DIAGNOSIS — R229 Localized swelling, mass and lump, unspecified: Secondary | ICD-10-CM

## 2018-09-07 DIAGNOSIS — IMO0002 Reserved for concepts with insufficient information to code with codable children: Secondary | ICD-10-CM

## 2018-09-07 MED ORDER — CLOBETASOL 0.05 % TOPICAL FOAM
0.05 % | Freq: Two times a day (BID) | CUTANEOUS | 0 refills | Status: DC
Start: 2018-09-07 — End: 2020-01-20

## 2018-09-07 MED ORDER — ALBUTEROL SULFATE HFA 90 MCG/ACTUATION AEROSOL INHALER
90 mcg/actuation | RESPIRATORY_TRACT | 3 refills | Status: AC
Start: 2018-09-07 — End: ?

## 2018-09-07 NOTE — Progress Notes (Signed)
 ROOM # 2    Morgan Alexander presents today for   Chief Complaint   Patient presents with   . COPD     one month follow up       Morgan Alexander preferred language for health care discussion is english/other.    Is someone accompanying this pt? No    Is the patient using any DME equipment during OV? No    Depression Screening:  3 most recent PHQ Screens 09/07/2018 07/10/2018   Little interest or pleasure in doing things Not at all Not at all   Feeling down, depressed, irritable, or hopeless Not at all Not at all   Total Score PHQ 2 0 0       Learning Assessment:  No flowsheet data found.    Abuse Screening:  No flowsheet data found.    Fall Risk  Fall Risk Assessment, last 12 mths 09/07/2018 07/10/2018   Able to walk? Yes Yes   Fall in past 12 months? No No       Health Maintenance reviewed and discussed per provider. Yes    Morgan Alexander is due for   Health Maintenance Due   Topic Date Due   . Hepatitis C Screening  Jun 07, 1951   . DTaP/Tdap/Td series (1 - Tdap) 10/13/1973   . Shingrix Vaccine Age 36> (1 of 2) 10/13/2002   . BREAST CANCER SCRN MAMMOGRAM  10/13/2002   . FOBT Q 1 YEAR AGE 4-75  10/13/2002   . GLAUCOMA SCREENING Q2Y  10/13/2017   . Bone Densitometry (Dexa) Screening  10/13/2017   . Pneumococcal 65+ years (1 of 2 - PCV13) 10/13/2017   . MEDICARE YEARLY EXAM  07/10/2018   . Influenza Age 53 to Adult  07/29/2018     Please order/place referral if appropriate.      Advance Directive:  1. Do you have an advance directive in place? Patient Reply: No    2. If not, would you like material regarding how to put one in place? Patient Reply: No    Coordination of Care:  1. Have you been to the ER, urgent care clinic since your last visit?  Hospitalized since your last visit? No    2. Have you seen or consulted any other health care providers outside of the [facility] since your last visit? Include any pap smears or colon screening. No

## 2018-09-07 NOTE — Progress Notes (Signed)
Chief Complaint   Patient presents with   ??? COPD     one month follow up       HPI:     Morgan Alexander is a 67 y.o. Caucasian female with history of  COPD and RA  here for the above complaint.     Pulmonary: Appt. This month.   GI: She has seen.  Dentist: She saw and they said mass on right side of face is not dental.     She has seen GI for her nausea and had gastric emptying study and CT scan. She said it is getting worse and has gotten worse.     She has some shortness of breath, but no chest pain, headaches or dizziness.     Past Medical History:   Diagnosis Date   ??? Arthritis    ??? Asthma    ??? Chronic obstructive pulmonary disease ([place])    ??? Osteoporosis    ??? Prediabetes 07/2018   ??? Rheumatoid arthritis ([place])      Past Surgical History:   Procedure Laterality Date   ??? IR PUNCTURE DRAIN OF LESION       Current Outpatient Medications   Medication Sig   ??? albuterol (PROVENTIL HFA, VENTOLIN HFA, PROAIR HFA) 90 mcg/actuation inhaler Take 1-2 puffs every 4-6 hrs prn shortness of breath. Needs ventolin.   ??? clobetasol (OLUX) 0.05 % topical foam Apply  to affected area two (2) times a day. use thin film on affected area   ??? traZODone (DESYREL) 50 mg tablet Take 1 Tab by mouth nightly as needed for Sleep.   ??? DULoxetine (CYMBALTA) 60 mg capsule Take 1 Cap by mouth daily.   ??? DULoxetine (CYMBALTA) 30 mg capsule Take 1 Cap by mouth nightly.   ??? ondansetron (ZOFRAN ODT) 4 mg disintegrating tablet Take 1 Tab by mouth every eight (8) hours as needed for Nausea (vomiting).   ??? colesevelam (WELCHOL) 625 mg tablet Take 1,875 mg by mouth two (2) times daily (with meals).   ??? OTHER Indications: align probiotic daily   ??? RASUVO, PF, 25 mg/0.5 mL atIn Once a week (injectable)   ??? ORENCIA CLICKJECT 010 mg/mL atIn Indications: TAke once a week   ??? SPIRIVA WITH HANDIHALER 18 mcg inhalation capsule Take 1 Cap by inhalation daily.   ??? SYMBICORT 160-4.5 mcg/actuation HFAA Take 2 Puffs by inhalation two (2) times a day.   ??? dicyclomine  (BENTYL) 10 mg capsule Take 10 mg by mouth three (3) times daily.   ??? hydroxychloroquine (PLAQUENIL) 200 mg tablet Take 200 mg by mouth daily.   ??? omeprazole (PRILOSEC) 20 mg capsule Take 20 mg by mouth two (2) times a day.     No current facility-administered medications for this visit.      Health Maintenance   Topic Date Due   ??? Hepatitis C Screening  Jun 16, 1951   ??? DTaP/Tdap/Td series (1 - Tdap) 10/13/1973   ??? Shingrix Vaccine Age 27> (1 of 2) 10/13/2002   ??? BREAST CANCER SCRN MAMMOGRAM  10/13/2002   ??? FOBT Q 1 YEAR AGE 20-75  10/13/2002   ??? GLAUCOMA SCREENING Q2Y  10/13/2017   ??? Bone Densitometry (Dexa) Screening  10/13/2017   ??? Pneumococcal 65+ years (1 of 2 - PCV13) 10/13/2017   ??? MEDICARE YEARLY EXAM  07/10/2018   ??? Influenza Age 22 to Adult  07/29/2018       There is no immunization history on file for this patient.  No LMP  recorded. Patient is postmenopausal.        Allergies and Intolerances:   No Known Allergies    Family History:   History reviewed. No pertinent family history.    Social History:   She  reports that she has been smoking. She has never used smokeless tobacco.  She  reports that she drank alcohol.              OBJECTIVE:   Physical exam:   Visit Vitals  BP 113/60 (BP 1 Location: Right arm, BP Patient Position: Sitting)   Pulse 98   Temp 98.3 ??F (36.8 ??C) (Oral)   Resp 16   Ht 5\' 2"  (1.575 m)   Wt 107 lb (48.5 kg)   SpO2 95%   BMI 19.57 kg/m??        Generally: Pleasant female in no acute distress  Cardiac Exam: regular, rate, and rhythm. Normal S1 and S2. No murmurs, gallops, or rubs  Pulmonary exam: Clear to auscultation bilaterally  Abdominal exam: Positive bowel sounds in all four quadrants, soft, nondistended, nontender  Extremities: 2+ dorsalis pedis pulses bilaterally. No pedal edema    bilaterally    LABS/RADIOLOGICAL TESTS:  Lab Results   Component Value Date/Time    WBC 10.6 08/03/2018 10:44 AM    HGB 12.9 08/03/2018 10:44 AM    HCT 40.7 08/03/2018 10:44 AM    PLATELET 350  08/03/2018 10:44 AM     Lab Results   Component Value Date/Time    Sodium 142 08/03/2018 10:44 AM    Potassium 4.1 08/03/2018 10:44 AM    Chloride 101 08/03/2018 10:44 AM    CO2 25 08/03/2018 10:44 AM    Glucose 72 08/03/2018 10:44 AM    BUN 15 08/03/2018 10:44 AM    Creatinine 0.63 08/03/2018 10:44 AM     Lab Results   Component Value Date/Time    Cholesterol, total 178 08/03/2018 10:44 AM    HDL Cholesterol 63 08/03/2018 10:44 AM    LDL, calculated 98 08/03/2018 10:44 AM    Triglyceride 86 08/03/2018 10:44 AM     No results found for: GPT    Previous labs    ASSESSMENT/PLAN:    1. Mass  -     REFERRAL TO PLASTIC SURGERY    2. Chronic obstructive pulmonary disease, unspecified COPD type (HCC)  -     albuterol (PROVENTIL HFA, VENTOLIN HFA, PROAIR HFA) 90 mcg/actuation inhaler; Take 1-2 puffs every 4-6 hrs prn shortness of breath. Needs ventolin.    3. Uncomplicated asthma, unspecified asthma severity, unspecified whether persistent  -     albuterol (PROVENTIL HFA, VENTOLIN HFA, PROAIR HFA) 90 mcg/actuation inhaler; Take 1-2 puffs every 4-6 hrs prn shortness of breath. Needs ventolin.    Other orders  -     clobetasol (OLUX) 0.05 % topical foam; Apply  to affected area two (2) times a day. use thin film on affected area    4.   Requested Prescriptions     Signed Prescriptions Disp Refills   ??? albuterol (PROVENTIL HFA, VENTOLIN HFA, PROAIR HFA) 90 mcg/actuation inhaler 3 Inhaler 3     Sig: Take 1-2 puffs every 4-6 hrs prn shortness of breath. Needs ventolin.   ??? clobetasol (OLUX) 0.05 % topical foam 1 Can 0     Sig: Apply  to affected area two (2) times a day. use thin film on affected area     5. Patient verbalized understanding and agreement with the plan.  6. Patient was given an after-visit summary.    7.   Follow-up and Dispositions    ?? Return in about 4 months (around 01/07/2019) for f/u COPD  or sooner if worsening symptoms.               Lewie Loron, M.D.

## 2018-09-07 NOTE — Progress Notes (Signed)
Chief Complaint   Patient presents with   ??? COPD     one month follow up       HPI:     Morgan Alexander is a 67 y.o. Caucasian female with history of  COPD and RA  here for the above complaint.     Pulmonary: Appt. This month.   GI: She has seen.  Dentist: She saw and they said mass on right side of face is not dental.     She has seen GI for her nausea and had gastric emptying study and CT scan. She said it is getting worse and has gotten worse.     She has some shortness of breath, but no chest pain, headaches or dizziness.     Past Medical History:   Diagnosis Date   ??? Arthritis    ??? Asthma    ??? Chronic obstructive pulmonary disease ([place])    ??? Osteoporosis    ??? Prediabetes 07/2018   ??? Rheumatoid arthritis ([place])      Past Surgical History:   Procedure Laterality Date   ??? IR PUNCTURE DRAIN OF LESION       Current Outpatient Medications   Medication Sig   ??? albuterol (PROVENTIL HFA, VENTOLIN HFA, PROAIR HFA) 90 mcg/actuation inhaler Take 1-2 puffs every 4-6 hrs prn shortness of breath. Needs ventolin.   ??? clobetasol (OLUX) 0.05 % topical foam Apply  to affected area two (2) times a day. use thin film on affected area   ??? traZODone (DESYREL) 50 mg tablet Take 1 Tab by mouth nightly as needed for Sleep.   ??? DULoxetine (CYMBALTA) 60 mg capsule Take 1 Cap by mouth daily.   ??? DULoxetine (CYMBALTA) 30 mg capsule Take 1 Cap by mouth nightly.   ??? ondansetron (ZOFRAN ODT) 4 mg disintegrating tablet Take 1 Tab by mouth every eight (8) hours as needed for Nausea (vomiting).   ??? colesevelam (WELCHOL) 625 mg tablet Take 1,875 mg by mouth two (2) times daily (with meals).   ??? OTHER Indications: align probiotic daily   ??? RASUVO, PF, 25 mg/0.5 mL atIn Once a week (injectable)   ??? ORENCIA CLICKJECT 951 mg/mL atIn Indications: TAke once a week   ??? SPIRIVA WITH HANDIHALER 18 mcg inhalation capsule Take 1 Cap by inhalation daily.   ??? SYMBICORT 160-4.5 mcg/actuation HFAA Take 2 Puffs by inhalation two (2) times a day.    ??? dicyclomine (BENTYL) 10 mg capsule Take 10 mg by mouth three (3) times daily.   ??? hydroxychloroquine (PLAQUENIL) 200 mg tablet Take 200 mg by mouth daily.   ??? omeprazole (PRILOSEC) 20 mg capsule Take 20 mg by mouth two (2) times a day.     No current facility-administered medications for this visit.      Health Maintenance   Topic Date Due   ??? Hepatitis C Screening  1951/05/19   ??? DTaP/Tdap/Td series (1 - Tdap) 10/13/1973   ??? Shingrix Vaccine Age 59> (1 of 2) 10/13/2002   ??? BREAST CANCER SCRN MAMMOGRAM  10/13/2002   ??? FOBT Q 1 YEAR AGE 56-75  10/13/2002   ??? GLAUCOMA SCREENING Q2Y  10/13/2017   ??? Bone Densitometry (Dexa) Screening  10/13/2017   ??? Pneumococcal 65+ years (1 of 2 - PCV13) 10/13/2017   ??? MEDICARE YEARLY EXAM  07/10/2018   ??? Influenza Age 30 to Adult  07/29/2018       There is no immunization history on file for this patient.  No LMP  recorded. Patient is postmenopausal.        Allergies and Intolerances:   No Known Allergies    Family History:   History reviewed. No pertinent family history.    Social History:   She  reports that she has been smoking. She has never used smokeless tobacco.  She  reports that she drank alcohol.              OBJECTIVE:   Physical exam:   Visit Vitals  BP 113/60 (BP 1 Location: Right arm, BP Patient Position: Sitting)   Pulse 98   Temp 98.3 ??F (36.8 ??C) (Oral)   Resp 16   Ht 5\' 2"  (1.575 m)   Wt 107 lb (48.5 kg)   SpO2 95%   BMI 19.57 kg/m??        Generally: Pleasant female in no acute distress  Cardiac Exam: regular, rate, and rhythm. Normal S1 and S2. No murmurs, gallops, or rubs  Pulmonary exam: Clear to auscultation bilaterally  Abdominal exam: Positive bowel sounds in all four quadrants, soft, nondistended, nontender  Extremities: 2+ dorsalis pedis pulses bilaterally. No pedal edema    bilaterally    LABS/RADIOLOGICAL TESTS:  Lab Results   Component Value Date/Time    WBC 10.6 08/03/2018 10:44 AM    HGB 12.9 08/03/2018 10:44 AM    HCT 40.7 08/03/2018 10:44 AM     PLATELET 350 08/03/2018 10:44 AM     Lab Results   Component Value Date/Time    Sodium 142 08/03/2018 10:44 AM    Potassium 4.1 08/03/2018 10:44 AM    Chloride 101 08/03/2018 10:44 AM    CO2 25 08/03/2018 10:44 AM    Glucose 72 08/03/2018 10:44 AM    BUN 15 08/03/2018 10:44 AM    Creatinine 0.63 08/03/2018 10:44 AM     Lab Results   Component Value Date/Time    Cholesterol, total 178 08/03/2018 10:44 AM    HDL Cholesterol 63 08/03/2018 10:44 AM    LDL, calculated 98 08/03/2018 10:44 AM    Triglyceride 86 08/03/2018 10:44 AM     No results found for: GPT    Previous labs    ASSESSMENT/PLAN:    1. Mass  -     REFERRAL TO PLASTIC SURGERY    2. Chronic obstructive pulmonary disease, unspecified COPD type (HCC)  -     albuterol (PROVENTIL HFA, VENTOLIN HFA, PROAIR HFA) 90 mcg/actuation inhaler; Take 1-2 puffs every 4-6 hrs prn shortness of breath. Needs ventolin.    3. Uncomplicated asthma, unspecified asthma severity, unspecified whether persistent  -     albuterol (PROVENTIL HFA, VENTOLIN HFA, PROAIR HFA) 90 mcg/actuation inhaler; Take 1-2 puffs every 4-6 hrs prn shortness of breath. Needs ventolin.    Other orders  -     clobetasol (OLUX) 0.05 % topical foam; Apply  to affected area two (2) times a day. use thin film on affected area    4.   Requested Prescriptions     Signed Prescriptions Disp Refills   ??? albuterol (PROVENTIL HFA, VENTOLIN HFA, PROAIR HFA) 90 mcg/actuation inhaler 3 Inhaler 3     Sig: Take 1-2 puffs every 4-6 hrs prn shortness of breath. Needs ventolin.   ??? clobetasol (OLUX) 0.05 % topical foam 1 Can 0     Sig: Apply  to affected area two (2) times a day. use thin film on affected area     5. Patient verbalized understanding and agreement with the plan.  6. Patient was given an after-visit summary.    7.   Follow-up and Dispositions    ?? Return in about 4 months (around 01/07/2019) for f/u COPD  or sooner if worsening symptoms.               Lewie Loron, M.D.

## 2018-09-07 NOTE — Patient Instructions (Signed)
1) Follow-up in 4 months or sooner if worsening symptoms.

## 2018-09-07 NOTE — Progress Notes (Signed)
ROOM # 2    Morgan Alexander presents today for   Chief Complaint   Patient presents with   ??? COPD     one month follow up       [name] preferred language for health care discussion is english/other.    Is someone accompanying this pt? No    Is the patient using any DME equipment during OV? No    Depression Screening:  3 most recent PHQ Screens 09/07/2018 07/10/2018   Little interest or pleasure in doing things Not at all Not at all   Feeling down, depressed, irritable, or hopeless Not at all Not at all   Total Score PHQ 2 0 0       Learning Assessment:  No flowsheet data found.    Abuse Screening:  No flowsheet data found.    Fall Risk  Fall Risk Assessment, last 12 mths 09/07/2018 07/10/2018   Able to walk? Yes Yes   Fall in past 12 months? No No       Health Maintenance reviewed and discussed per provider. Yes    [name] is due for   Health Maintenance Due   Topic Date Due   ??? Hepatitis C Screening  01-23-1951   ??? DTaP/Tdap/Td series (1 - Tdap) 10/13/1973   ??? Shingrix Vaccine Age 29> (1 of 2) 10/13/2002   ??? BREAST CANCER SCRN MAMMOGRAM  10/13/2002   ??? FOBT Q 1 YEAR AGE 8-75  10/13/2002   ??? GLAUCOMA SCREENING Q2Y  10/13/2017   ??? Bone Densitometry (Dexa) Screening  10/13/2017   ??? Pneumococcal 65+ years (1 of 2 - PCV13) 10/13/2017   ??? MEDICARE YEARLY EXAM  07/10/2018   ??? Influenza Age 60 to Adult  07/29/2018     Please order/place referral if appropriate.      Advance Directive:  1. Do you have an advance directive in place? Patient Reply: No    2. If not, would you like material regarding how to put one in place? Patient Reply: No    Coordination of Care:  1. Have you been to the ER, urgent care clinic since your last visit?  Hospitalized since your last visit? No    2. Have you seen or consulted any other health care providers outside of the [facility] since your last visit? Include any pap smears or colon screening. No

## 2018-09-17 ENCOUNTER — Inpatient Hospital Stay: Admit: 2018-09-17 | Payer: MEDICARE | Primary: Internal Medicine

## 2018-09-17 DIAGNOSIS — K529 Noninfective gastroenteritis and colitis, unspecified: Secondary | ICD-10-CM

## 2018-09-18 LAB — CORTISOL
Cortisol, random: 7.8 ug/dL
Cortisol: 7.8 ug/dL

## 2018-09-18 LAB — IMMUNOGLOBULIN A
Immunoglobulin A,Serum: 288 mg/dL (ref 70–400)
Immunoglobulin A: 288 mg/dL (ref 70–400)

## 2018-09-19 LAB — TISSUE TRANSGLUTAM AB, IGA
T-TRANSGLUTAMINASE, IGA, 164643: <2 U/mL (ref 0–3)
t-Transglutaminase, IgA: <2 U/mL (ref 0–3)

## 2018-09-20 LAB — CALPROTECTIN, FECAL
CALPROTECTIN, FECAL: 58 ug/g (ref 0–120)
Calprotectin, Fecal: 58 ug/g (ref 0–120)

## 2018-09-20 LAB — GLIADIN AB, IGG: Deamidated Gliadin Ab, IgG: 3 units (ref 0–19)

## 2018-09-21 LAB — FECAL FAT, QL
Fats, Total: NORMAL
Fecal Neutral Fats:: NORMAL

## 2018-09-21 LAB — ENDOMYSIAL AB, IGA: Endomysial Ab, IgA: NEGATIVE

## 2018-09-22 LAB — PANCREATIC ELASTASE, FECAL
PANCREATIC ELASTASE, FECAL: >500 ug Elast./g (ref >200)
Pancreatic Elastase, Fecal: >500 ug Elast./g (ref >200)

## 2018-09-22 LAB — OVA+PARASITE + GIARDIA: Giardia lamblia Ag, EIA: NEGATIVE

## 2018-09-24 LAB — O+P/GIARDIA REFLEX

## 2018-09-28 ENCOUNTER — Inpatient Hospital Stay: Admit: 2018-09-28 | Payer: MEDICARE | Primary: Internal Medicine

## 2018-09-28 ENCOUNTER — Encounter

## 2018-09-28 DIAGNOSIS — D485 Neoplasm of uncertain behavior of skin: Secondary | ICD-10-CM

## 2018-12-15 LAB — HM HEPATITIS C SCREENING LAB: HM Hepatitis Screen: NEGATIVE

## 2019-03-28 ENCOUNTER — Telehealth
Admit: 2019-03-28 | Discharge: 2019-03-28 | Payer: PRIVATE HEALTH INSURANCE | Attending: Internal Medicine | Primary: Internal Medicine

## 2019-03-28 ENCOUNTER — Telehealth: Attending: Internal Medicine | Primary: Internal Medicine

## 2019-03-28 DIAGNOSIS — M069 Rheumatoid arthritis, unspecified: Secondary | ICD-10-CM

## 2019-03-28 DIAGNOSIS — L280 Lichen simplex chronicus: Secondary | ICD-10-CM | POA: Insufficient documentation

## 2019-03-28 DIAGNOSIS — F5101 Primary insomnia: Secondary | ICD-10-CM | POA: Insufficient documentation

## 2019-03-28 DIAGNOSIS — F339 Major depressive disorder, recurrent, unspecified: Secondary | ICD-10-CM | POA: Insufficient documentation

## 2019-03-28 MED ORDER — AMITRIPTYLINE 50 MG TAB
50 mg | ORAL_TABLET | Freq: Every evening | ORAL | 0 refills | Status: DC
Start: 2019-03-28 — End: 2019-08-08

## 2019-03-28 NOTE — Progress Notes (Signed)
Morgan Alexander presents via [place] on computer for [facility], joint pain, depression      Pt's current location: home    Consent:  She and/or health care decision maker is aware that that she may receive a bill for this telephone service, depending on her insurance coverage, and has provided verbal consent to proceed: Yes

## 2019-03-28 NOTE — Progress Notes (Signed)
Morgan Alexander is a 68 y.o. female who was seen by synchronous (real-time) audio-video technology on 03/28/2019.      Location of the patient: home    Location of the provider: [facility]    Consent:  She and/or health care decision maker is aware that that she may receive a bill for this telephone service, depending on her insurance coverage, and has provided verbal consent to proceed: Yes    Subjective:   Morgan Alexander is a 68 y.o. female who presents today for management of    Chief Complaint   Patient presents with   ??? Establish Care   ??? Joint Pain   ??? Depression       Previous PCP: [place]    Rheumatoid Arthritis  Patient has history of rheumatoid arthritis.  Symptoms include joint pain and morning stiffness and are of moderate severity. Patient denies eye symptoms, weakness of hands and skin nodules. Patient denies associated fevers, nodules, new headache.  Overall disease activity:  Stable. Limitation on activities include none.  Patient takes methotrexate, Plaquenil and prednisone.    Patient has history of osteoporosis. She is waiting to start Prolia injection.    Rheumatologist: Dr. Deanne Coffer.    Depression Review:  Patient is seen for followup of depression. Treatment includes duloxetine and no other therapies.   Ongoing symptoms include depressed mood, anhedonia and fatigue.  She denies hopelessness and recurrent thoughts of death.   She experiences the following side effects from the treatment: none.    COPD  Patient has history of COPD and pulmonary nodules. Disease course has been stable. She is being followed by [facility].    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    Patient has history of chronic diarrhea. This has been controlled by Welchol and PRN dicyclomine.    Problem List  Patient Active Problem List    Diagnosis Date Noted   ??? Rheumatoid arthritis involving multiple sites ([place]) 03/28/2019   ??? 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 Pulmonary  nodule 03/28/2019   ??? Chronic diarrhea 03/28/2019   ??? Pulmonary emphysema ([place]) 03/28/2019   ??? Primary insomnia 03/28/2019   ??? Recurrent depression ([place]) 03/28/2019   ??? Prediabetes 07/29/2018       Current Medications  Current Outpatient Medications   Medication Sig   ??? famotidine (PEPCID) 40 mg tablet Take 40 mg by mouth daily.   ??? methotrexate, PF, 25 mg/mL injection every seven (7) days.   ??? clindamycin (CLEOCIN T) 1 % external solution Apply  to affected area two (2) times a day. use thin film on affected area   ??? predniSONE (DELTASONE) 5 mg tablet Take 5 mg by mouth two (2) times a day.   ??? amitriptyline (ELAVIL) 50 mg tablet Take 1 Tab by mouth nightly.   ??? albuterol (PROVENTIL HFA, VENTOLIN HFA, PROAIR HFA) 90 mcg/actuation inhaler Take 1-2 puffs every 4-6 hrs prn shortness of breath. Needs ventolin.   ??? clobetasol (OLUX) 0.05 % topical foam Apply  to affected area two (2) times a day. use thin film on affected area   ??? DULoxetine (CYMBALTA) 60 mg capsule Take 1 Cap by mouth daily.   ??? DULoxetine (CYMBALTA) 30 mg capsule Take 1 Cap by mouth nightly.   ??? ondansetron (ZOFRAN ODT) 4 mg disintegrating tablet Take 1 Tab by mouth every eight (8) hours as needed for Nausea (vomiting).   ??? colesevelam (WELCHOL) 625 mg tablet Take 1,875 mg by  mouth two (2) times daily (with meals).   ??? SPIRIVA WITH HANDIHALER 18 mcg inhalation capsule Take 1 Cap by inhalation daily.   ??? SYMBICORT 160-4.5 mcg/actuation HFAA Take 2 Puffs by inhalation two (2) times a day.   ??? dicyclomine (BENTYL) 10 mg capsule Take 10 mg by mouth three (3) times daily.   ??? hydroxychloroquine (PLAQUENIL) 200 mg tablet Take 200 mg by mouth daily.     No current facility-administered medications for this visit.        Allergies/Drug Reactions  No Known Allergies     Social History  Social History     Socioeconomic History   ??? Marital status: WIDOWED     Spouse name: Not on file   ??? Number of children: Not on file   ??? Years of education: Not on file   ???  Highest education level: Not on file   Occupational History   ??? Not on file   Social Needs   ??? Financial resource strain: Not on file   ??? Food insecurity     Worry: Not on file     Inability: Not on file   ??? Transportation needs     Medical: Not on file     Non-medical: Not on file   Tobacco Use   ??? Smoking status: Current Every Day Smoker     Packs/day: 0.50     Years: 30.00     Pack years: 15.00     Types: Cigarettes   ??? Smokeless tobacco: Never Used   Substance and Sexual Activity   ??? Alcohol use: Not Currently     Frequency: Never   ??? Drug use: Never   ??? Sexual activity: Not Currently   Lifestyle   ??? Physical activity     Days per week: Not on file     Minutes per session: Not on file   ??? Stress: Not on file   Relationships   ??? Social Product manager on phone: Not on file     Gets together: Not on file     Attends religious service: Not on file     Active member of club or organization: Not on file     Attends meetings of clubs or organizations: Not on file     Relationship status: Not on file   ??? Intimate partner violence     Fear of current or ex partner: Not on file     Emotionally abused: Not on file     Physically abused: Not on file     Forced sexual activity: Not on file   Other Topics Concern   ??? Not on file   Social History Narrative   ??? Not on file       Review of Systems  Review of Systems   Constitutional: Negative for chills, fever, malaise/fatigue and weight loss.   HENT: Negative for congestion and sore throat.    Respiratory: Negative for cough, sputum production, shortness of breath and wheezing.    Cardiovascular: Negative.    Gastrointestinal: Negative.  Negative for abdominal pain, blood in stool, constipation, diarrhea, heartburn, nausea and vomiting.   Genitourinary: Negative.    Musculoskeletal: Positive for joint pain and myalgias.   Neurological: Negative for dizziness and headaches.   Psychiatric/Behavioral: Positive for depression. The patient has insomnia. The patient is not  nervous/anxious.          Objective:     General: alert, cooperative, no distress  Mental  status: mental status: alert, oriented to person, place, and time, normal mood, behavior, speech, dress, motor activity, and thought processes   Resp: resp: normal effort and no respiratory distress   Neuro: neuro: no gross deficits   Skin: skin: no discoloration or lesions of concern on visible areas     Due to this being a TeleHealth evaluation, many elements of the physical examination are unable to be assessed.     Component      Latest Ref Rng & Units 08/03/2018 08/03/2018 08/03/2018 08/03/2018          10:44 AM 10:44 AM 10:44 AM 10:44 AM   Glucose      65 - 99 mg/dL    72   BUN      8 - 27 mg/dL    15   Creatinine      0.57 - 1.00 mg/dL    0.63   GFR est non-AA      >59 mL/min/1.73    94   GFR est AA      >59 mL/min/1.73    109   BUN/Creatinine ratio      12 - 28    24   Sodium      134 - 144 mmol/L    142   Potassium      3.5 - 5.2 mmol/L    4.1   Chloride      96 - 106 mmol/L    101   CO2      20 - 29 mmol/L    25   Calcium      8.7 - 10.3 mg/dL    9.4   Protein, total      6.0 - 8.5 g/dL    7.3   Albumin      3.6 - 4.8 g/dL    4.2   GLOBULIN, TOTAL      1.5 - 4.5 g/dL    3.1   A-G Ratio      1.2 - 2.2    1.4   Bilirubin, total      0.0 - 1.2 mg/dL    <0.2   Alk. phosphatase      39 - 117 IU/L    88   AST      0 - 40 IU/L    19   ALT (SGPT)      0 - 32 IU/L    11   WBC      3.4 - 10.8 x10E3/uL   10.6    RBC      3.77 - 5.28 x10E6/uL   4.44    HGB      11.1 - 15.9 g/dL   12.9    HCT      34.0 - 46.6 %   40.7    MCV      79 - 97 fL   92    MCH      26.6 - 33.0 pg   29.1    MCHC      31.5 - 35.7 g/dL   31.7    RDW      12.3 - 15.4 %   15.7 (H)    PLATELET      150 - 450 x10E3/uL   350    Cholesterol, total      100 - 199 mg/dL  178     Triglyceride      0 - 149 mg/dL  86     HDL  Cholesterol      >39 mg/dL  63     VLDL, calculated      5 - 40 mg/dL  17     LDL, calculated      0 - 99 mg/dL  98     Hemoglobin A1c, (calculated)       4.8 - 5.6 % 5.8 (H)      Estimated average glucose      mg/dL 120        Component      Latest Ref Rng & Units 08/03/2018 08/03/2018          10:44 AM 10:44 AM   TSH      0.450 - 4.500 uIU/mL  3.240   VITAMIN D, 25-HYDROXY      30.0 - 100.0 ng/mL 48.8        Assessment & Plan:   1. Rheumatoid arthritis involving multiple sites, unspecified rheumatoid factor presence (HCC)  - stable  - continue prednisone, MTX and Plaquenil as per rheumatology    2. Recurrent depression ([place])  - stable  - continue Cymbalta    3. Primary insomnia  - increase amitriptyline (ELAVIL) 50 mg tablet; Take 1 Tab by mouth nightly.  Dispense: 90 Tab; Refill: 0    4. Pulmonary emphysema, unspecified emphysema type ([place])  - stable on Symbicort and Spiriva  - pulmonology follow-up    5. Chronic diarrhea  - improved after taking Welchol  - takes PRN Bentyl    6. Prediabetes  - lifestyle modification    7. Neurodermatitis  - continue clobetasol and clindamycin solution        Follow-up and Dispositions    ?? Return in about 3 months (around 06/28/2019) for EOV.         We discussed the expected course, resolution and complications of the diagnosis(es) in detail.  Medication risks, benefits, costs, interactions, and alternatives were discussed as indicated.  I advised her to contact the office if her condition worsens, changes or fails to improve as anticipated. She expressed understanding with the diagnosis(es) and plan.         Pursuant to the emergency declaration under the [place], 1135 waiver authority and the [employer] Act, this Virtual  Visit was conducted, with patient's consent, to reduce the patient's risk of exposure to COVID-19 and provide continuity of care for an established patient.     Services were provided through a video synchronous discussion virtually to substitute for in-person clinic visit.    Karen Kays, MD

## 2019-03-28 NOTE — Progress Notes (Signed)
Morgan Alexander presents via Morgan Alexander on computer for Est Care, joint pain, depression      Pt's current location: home    Consent:  She and/or health care decision maker is aware that that she may receive a bill for this telephone service, depending on her insurance coverage, and has provided verbal consent to proceed: Yes

## 2019-03-28 NOTE — Progress Notes (Signed)
Morgan Alexander is a 68 y.o. female who was seen by synchronous (real-time) audio-video technology on 03/28/2019.      Location of the patient: home    Location of the provider: [facility]    Consent:  She and/or health care decision maker is aware that that she may receive a bill for this telephone service, depending on her insurance coverage, and has provided verbal consent to proceed: Yes    Subjective:   Morgan Alexander is a 68 y.o. female who presents today for management of    Chief Complaint   Patient presents with   ??? Establish Care   ??? Joint Pain   ??? Depression       Previous PCP: [place]    Rheumatoid Arthritis  Patient has history of rheumatoid arthritis.  Symptoms include joint pain and morning stiffness and are of moderate severity. Patient denies eye symptoms, weakness of hands and skin nodules. Patient denies associated fevers, nodules, new headache.  Overall disease activity:  Stable. Limitation on activities include none.  Patient takes methotrexate, Plaquenil and prednisone.    Patient has history of osteoporosis. She is waiting to start Prolia injection.    Rheumatologist: Dr. Deanne Coffer.    Depression Review:  Patient is seen for followup of depression. Treatment includes duloxetine and no other therapies.   Ongoing symptoms include depressed mood, anhedonia and fatigue.  She denies hopelessness and recurrent thoughts of death.   She experiences the following side effects from the treatment: none.    COPD  Patient has history of COPD and pulmonary nodules. Disease course has been stable. She is being followed by [facility].    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    Patient has history of chronic diarrhea. This has been controlled by Welchol and PRN dicyclomine.    Problem List  Patient Active Problem List    Diagnosis Date Noted   ??? Rheumatoid arthritis involving multiple sites ([place]) 03/28/2019   ??? Neurodermatitis 03/28/2019    ??? Pulmonary nodule 03/28/2019   ??? Chronic diarrhea 03/28/2019   ??? Pulmonary emphysema ([place]) 03/28/2019   ??? Primary insomnia 03/28/2019   ??? Recurrent depression ([place]) 03/28/2019   ??? Prediabetes 07/29/2018       Current Medications  Current Outpatient Medications   Medication Sig   ??? famotidine (PEPCID) 40 mg tablet Take 40 mg by mouth daily.   ??? methotrexate, PF, 25 mg/mL injection every seven (7) days.   ??? clindamycin (CLEOCIN T) 1 % external solution Apply  to affected area two (2) times a day. use thin film on affected area   ??? predniSONE (DELTASONE) 5 mg tablet Take 5 mg by mouth two (2) times a day.   ??? amitriptyline (ELAVIL) 50 mg tablet Take 1 Tab by mouth nightly.   ??? albuterol (PROVENTIL HFA, VENTOLIN HFA, PROAIR HFA) 90 mcg/actuation inhaler Take 1-2 puffs every 4-6 hrs prn shortness of breath. Needs ventolin.   ??? clobetasol (OLUX) 0.05 % topical foam Apply  to affected area two (2) times a day. use thin film on affected area   ??? DULoxetine (CYMBALTA) 60 mg capsule Take 1 Cap by mouth daily.   ??? DULoxetine (CYMBALTA) 30 mg capsule Take 1 Cap by mouth nightly.   ??? ondansetron (ZOFRAN ODT) 4 mg disintegrating tablet Take 1 Tab by mouth every eight (8) hours as needed for Nausea (vomiting).   ??? colesevelam (WELCHOL) 625 mg tablet Take 1,875 mg by  mouth two (2) times daily (with meals).   ??? SPIRIVA WITH HANDIHALER 18 mcg inhalation capsule Take 1 Cap by inhalation daily.   ??? SYMBICORT 160-4.5 mcg/actuation HFAA Take 2 Puffs by inhalation two (2) times a day.   ??? dicyclomine (BENTYL) 10 mg capsule Take 10 mg by mouth three (3) times daily.   ??? hydroxychloroquine (PLAQUENIL) 200 mg tablet Take 200 mg by mouth daily.     No current facility-administered medications for this visit.        Allergies/Drug Reactions  No Known Allergies     Social History  Social History     Socioeconomic History   ??? Marital status: WIDOWED     Spouse name: Not on file   ??? Number of children: Not on file    ??? Years of education: Not on file   ??? Highest education level: Not on file   Occupational History   ??? Not on file   Social Needs   ??? Financial resource strain: Not on file   ??? Food insecurity     Worry: Not on file     Inability: Not on file   ??? Transportation needs     Medical: Not on file     Non-medical: Not on file   Tobacco Use   ??? Smoking status: Current Every Day Smoker     Packs/day: 0.50     Years: 30.00     Pack years: 15.00     Types: Cigarettes   ??? Smokeless tobacco: Never Used   Substance and Sexual Activity   ??? Alcohol use: Not Currently     Frequency: Never   ??? Drug use: Never   ??? Sexual activity: Not Currently   Lifestyle   ??? Physical activity     Days per week: Not on file     Minutes per session: Not on file   ??? Stress: Not on file   Relationships   ??? Social Product manager on phone: Not on file     Gets together: Not on file     Attends religious service: Not on file     Active member of club or organization: Not on file     Attends meetings of clubs or organizations: Not on file     Relationship status: Not on file   ??? Intimate partner violence     Fear of current or ex partner: Not on file     Emotionally abused: Not on file     Physically abused: Not on file     Forced sexual activity: Not on file   Other Topics Concern   ??? Not on file   Social History Narrative   ??? Not on file       Review of Systems  Review of Systems   Constitutional: Negative for chills, fever, malaise/fatigue and weight loss.   HENT: Negative for congestion and sore throat.    Respiratory: Negative for cough, sputum production, shortness of breath and wheezing.    Cardiovascular: Negative.    Gastrointestinal: Negative.  Negative for abdominal pain, blood in stool, constipation, diarrhea, heartburn, nausea and vomiting.   Genitourinary: Negative.    Musculoskeletal: Positive for joint pain and myalgias.   Neurological: Negative for dizziness and headaches.    Psychiatric/Behavioral: Positive for depression. The patient has insomnia. The patient is not nervous/anxious.          Objective:     General: alert, cooperative, no distress  Mental  status: mental status: alert, oriented to person, place, and time, normal mood, behavior, speech, dress, motor activity, and thought processes   Resp: resp: normal effort and no respiratory distress   Neuro: neuro: no gross deficits   Skin: skin: no discoloration or lesions of concern on visible areas     Due to this being a TeleHealth evaluation, many elements of the physical examination are unable to be assessed.     Component      Latest Ref Rng & Units 08/03/2018 08/03/2018 08/03/2018 08/03/2018          10:44 AM 10:44 AM 10:44 AM 10:44 AM   Glucose      65 - 99 mg/dL    72   BUN      8 - 27 mg/dL    15   Creatinine      0.57 - 1.00 mg/dL    0.63   GFR est non-AA      >59 mL/min/1.73    94   GFR est AA      >59 mL/min/1.73    109   BUN/Creatinine ratio      12 - 28    24   Sodium      134 - 144 mmol/L    142   Potassium      3.5 - 5.2 mmol/L    4.1   Chloride      96 - 106 mmol/L    101   CO2      20 - 29 mmol/L    25   Calcium      8.7 - 10.3 mg/dL    9.4   Protein, total      6.0 - 8.5 g/dL    7.3   Albumin      3.6 - 4.8 g/dL    4.2   GLOBULIN, TOTAL      1.5 - 4.5 g/dL    3.1   A-G Ratio      1.2 - 2.2    1.4   Bilirubin, total      0.0 - 1.2 mg/dL    <0.2   Alk. phosphatase      39 - 117 IU/L    88   AST      0 - 40 IU/L    19   ALT (SGPT)      0 - 32 IU/L    11   WBC      3.4 - 10.8 x10E3/uL   10.6    RBC      3.77 - 5.28 x10E6/uL   4.44    HGB      11.1 - 15.9 g/dL   12.9    HCT      34.0 - 46.6 %   40.7    MCV      79 - 97 fL   92    MCH      26.6 - 33.0 pg   29.1    MCHC      31.5 - 35.7 g/dL   31.7    RDW      12.3 - 15.4 %   15.7 (H)    PLATELET      150 - 450 x10E3/uL   350    Cholesterol, total      100 - 199 mg/dL  178     Triglyceride      0 - 149 mg/dL  86     HDL  Cholesterol      >39 mg/dL  63     VLDL, calculated       5 - 40 mg/dL  17     LDL, calculated      0 - 99 mg/dL  98     Hemoglobin A1c, (calculated)      4.8 - 5.6 % 5.8 (H)      Estimated average glucose      mg/dL 120        Component      Latest Ref Rng & Units 08/03/2018 08/03/2018          10:44 AM 10:44 AM   TSH      0.450 - 4.500 uIU/mL  3.240   VITAMIN D, 25-HYDROXY      30.0 - 100.0 ng/mL 48.8        Assessment & Plan:   1. Rheumatoid arthritis involving multiple sites, unspecified rheumatoid factor presence (HCC)  - stable  - continue prednisone, MTX and Plaquenil as per rheumatology    2. Recurrent depression ([place])  - stable  - continue Cymbalta    3. Primary insomnia  - increase amitriptyline (ELAVIL) 50 mg tablet; Take 1 Tab by mouth nightly.  Dispense: 90 Tab; Refill: 0    4. Pulmonary emphysema, unspecified emphysema type ([place])  - stable on Symbicort and Spiriva  - pulmonology follow-up    5. Chronic diarrhea  - improved after taking Welchol  - takes PRN Bentyl    6. Prediabetes  - lifestyle modification    7. Neurodermatitis  - continue clobetasol and clindamycin solution        Follow-up and Dispositions    ?? Return in about 3 months (around 06/28/2019) for EOV.         We discussed the expected course, resolution and complications of the diagnosis(es) in detail.  Medication risks, benefits, costs, interactions, and alternatives were discussed as indicated.  I advised her to contact the office if her condition worsens, changes or fails to improve as anticipated. She expressed understanding with the diagnosis(es) and plan.         Pursuant to the emergency declaration under the [place], 1135 waiver authority and the [employer] Act, this Virtual  Visit was conducted, with patient's consent, to reduce the patient's risk of exposure to COVID-19 and provide continuity of care for an established patient.      Services were provided through a video synchronous discussion virtually to substitute for in-person clinic visit.    Karen Kays, MD

## 2019-04-01 ENCOUNTER — Telehealth: Payer: MEDICARE

## 2019-04-01 MED ORDER — NYSTATIN 100,000 UNIT/ML ORAL SUSP
100000 unit/mL | Freq: Four times a day (QID) | ORAL | 0 refills | Status: AC
Start: 2019-04-01 — End: 2019-04-08

## 2019-04-01 NOTE — Telephone Encounter (Signed)
Oral lesions for 2 weeks, raised and erythematous. Mainly on the tongue. Has history of oral thrush.    Photos reviewed in [place].    A/P:    ICD-10-CM ICD-9-CM    1. Oral candidiasis B37.0 112.0 nystatin (MYCOSTATIN) 100,000 unit/mL suspension     5-10 minutes were spent on the digital evaluation and management of this patient.

## 2019-05-02 ENCOUNTER — Encounter

## 2019-05-04 ENCOUNTER — Encounter

## 2019-07-01 ENCOUNTER — Ambulatory Visit: Payer: MEDICARE | Primary: Internal Medicine

## 2019-07-29 ENCOUNTER — Telehealth: Payer: MEDICARE

## 2019-07-29 NOTE — Telephone Encounter (Signed)
Nurse spoke with patient, advised to create a E-visit, if E visit reject please call back to schedule an appointment or go to [facility]. This encounter will be closed.

## 2019-07-29 NOTE — Telephone Encounter (Signed)
Pt called in and was wanting to know if she could receive a call back from the nurse or Dr. B could please give her a call back in regards to what she can do and sue on a rash that has traveled down her back and down to her right butt cheek. She stated it is starting to hurt and bother her and she is unsure of what to do. Please advise.

## 2019-08-01 NOTE — Telephone Encounter (Signed)
Patient will be scheduled for a virtual visit.

## 2019-08-08 ENCOUNTER — Telehealth
Admit: 2019-08-08 | Discharge: 2019-08-08 | Payer: PRIVATE HEALTH INSURANCE | Attending: Internal Medicine | Primary: Internal Medicine

## 2019-08-08 ENCOUNTER — Telehealth: Attending: Internal Medicine | Primary: Internal Medicine

## 2019-08-08 DIAGNOSIS — R21 Rash and other nonspecific skin eruption: Secondary | ICD-10-CM

## 2019-08-08 MED ORDER — AMITRIPTYLINE 100 MG TAB
100 mg | ORAL_TABLET | Freq: Every evening | ORAL | 0 refills | Status: DC
Start: 2019-08-08 — End: 2019-09-13

## 2019-08-08 NOTE — Progress Notes (Signed)
Morgan Alexander is a 68 y.o. female who was seen by synchronous (real-time) audio-video technology using MyChart on 08/08/2019.      Location of the patient: Home. Patient is currently in [place], [place] taking care of her daughter who had surgery.    Location of the provider: [facility]    Consent:  She and/or health care decision maker is aware that that she may receive a bill for this telehealth service, depending on her insurance coverage, and has provided verbal consent to proceed: Yes    Subjective:   Morgan Alexander is a 68 y.o. female who presents today for management of    Chief Complaint   Patient presents with   ??? Rash       Patient complains of multiple skin lesions on trunk, arms and legs. Onset was several years ago, intermittent since that time. Lesions are small, round, pink, raised. It is pruritic at times, sometimes painful when scratched. Lesions slowly spreads on her body. Patient has history of neurodermatitis.    COPD Review:  The patient is being seen for follow up of COPD.   Oxygen: She currently is not on home oxygen therapy.  Symptoms: chronic dyspnea: severity = mild: course of sx: stable.   Patient does smoke cigarettes.    Problem List  Patient Active Problem List    Diagnosis Date Noted   ??? Rheumatoid arthritis involving multiple sites ([place]) 03/28/2019   ??? 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 Pulmonary nodule 03/28/2019   ??? Chronic diarrhea 03/28/2019   ??? Pulmonary emphysema ([place]) 03/28/2019   ??? Primary insomnia 03/28/2019   ??? Recurrent depression ([place]) 03/28/2019   ??? Prediabetes 07/29/2018       Current Medications  Current Outpatient Medications   Medication Sig   ??? famotidine (PEPCID) 40 mg tablet Take 40 mg by mouth daily.   ??? methotrexate, PF, 25 mg/mL injection every seven (7) days.   ??? clindamycin (CLEOCIN T) 1 % external solution Apply  to affected area two (2) times a day. use thin film on affected area   ??? predniSONE (DELTASONE) 5 mg tablet Take 5 mg by mouth two (2)  times a day.   ??? amitriptyline (ELAVIL) 50 mg tablet Take 1 Tab by mouth nightly.   ??? albuterol (PROVENTIL HFA, VENTOLIN HFA, PROAIR HFA) 90 mcg/actuation inhaler Take 1-2 puffs every 4-6 hrs prn shortness of breath. Needs ventolin.   ??? clobetasol (OLUX) 0.05 % topical foam Apply  to affected area two (2) times a day. use thin film on affected area   ??? DULoxetine (CYMBALTA) 60 mg capsule Take 1 Cap by mouth daily.   ??? DULoxetine (CYMBALTA) 30 mg capsule Take 1 Cap by mouth nightly.   ??? ondansetron (ZOFRAN ODT) 4 mg disintegrating tablet Take 1 Tab by mouth every eight (8) hours as needed for Nausea (vomiting).   ??? colesevelam (WELCHOL) 625 mg tablet Take 1,875 mg by mouth two (2) times daily (with meals).   ??? SPIRIVA WITH HANDIHALER 18 mcg inhalation capsule Take 1 Cap by inhalation daily.   ??? SYMBICORT 160-4.5 mcg/actuation HFAA Take 2 Puffs by inhalation two (2) times a day.   ??? dicyclomine (BENTYL) 10 mg capsule Take 10 mg by mouth three (3) times daily.   ??? hydroxychloroquine (PLAQUENIL) 200 mg tablet Take 200 mg by mouth daily.     No current facility-administered medications for this visit.        Allergies/Drug Reactions  No Known Allergies  Social History  Social History     Tobacco Use   ??? Smoking status: Current Every Day Smoker     Packs/day: 0.50     Years: 30.00     Pack years: 15.00     Types: Cigarettes   ??? Smokeless tobacco: Never Used   Substance Use Topics   ??? Alcohol use: Not Currently     Frequency: Never   ??? Drug use: Never        Review of Systems  Review of Systems   Constitutional: Negative for chills, fever, malaise/fatigue and weight loss.   Respiratory: Negative.    Cardiovascular: Negative.    Gastrointestinal: Negative.    Genitourinary: Negative.    Musculoskeletal: Negative.    Skin: Positive for rash. Negative for itching.   Neurological: Negative for dizziness and headaches.   Psychiatric/Behavioral: Positive for depression. Negative for memory loss. The patient is not  nervous/anxious and does not have insomnia.          Objective:     General: alert, cooperative, no distress   Mental  status: mental status: alert, oriented to person, place, and time, normal mood, behavior, speech, dress, motor activity, and thought processes   Resp: resp: normal effort and no respiratory distress   Neuro: neuro: no gross deficits   Skin: skin: exam limited. LESIONS NOTED: flat, hypopigmented lesions on the chest, trunk and back.     Due to this being a TeleHealth evaluation, many elements of the physical examination are unable to be assessed.       Assessment & Plan:   1. Rash and nonspecific skin eruption  - previously diagnosed with neurodermatitis  - continue Cymbalta  - may use lidocaine cream for pain    2. Neurodermatitis  - consider dermatology referral in the future for re-evaluation    3. Recurrent depression (HCC)  - stable    4. Rheumatoid arthritis involving multiple sites, unspecified rheumatoid factor presence (HCC)  - stable  - being tapered off prednisone  - rheumatology follow-up    5. Pulmonary emphysema, unspecified emphysema type (HCC)  - stable    6. Insomnia  - increase Elavil to 100mg  QHS      We discussed the expected course, resolution and complications of the diagnosis(es) in detail.  Medication risks, benefits, costs, interactions, and alternatives were discussed as indicated.  I advised her to contact the office if her condition worsens, changes or fails to improve as anticipated. She expressed understanding with the diagnosis(es) and plan.         Pursuant to the emergency declaration under the [place], [place] waiver authority and the [employer] Act, this Virtual  Visit was conducted, with patient's consent, to reduce the patient's risk of exposure to COVID-19 and provide continuity of care for an established patient.     Services were provided through a video synchronous  discussion virtually to substitute for in-person clinic visit.    Karen Kays, MD     This is the Subsequent Medicare Annual Wellness Exam, performed 12 months or more after the Initial AWV or the last Subsequent AWV    I have reviewed the patient's medical history in detail and updated the computerized patient record.     History     Patient Active Problem List   Diagnosis Code   ??? Prediabetes R73.03   ??? Rheumatoid arthritis involving multiple sites ([place]) M06.9   ??? Neurodermatitis  L28.0   ??? Pulmonary nodule R91.1   ??? Chronic diarrhea K52.9   ??? Pulmonary emphysema (HCC) J43.9   ??? Primary insomnia F51.01   ??? Recurrent depression (HCC) F33.9     Past Medical History:   Diagnosis Date   ??? Arthritis    ??? Asthma    ??? Chronic diarrhea    ??? Chronic obstructive pulmonary disease ([place])    ??? Depression    ??? Insomnia    ??? Osteoporosis    ??? Prediabetes 07/2018   ??? Pulmonary nodule    ??? Rheumatoid arthritis ([place])       Past Surgical History:   Procedure Laterality Date   ??? IR PUNCTURE DRAIN OF LESION       Current Outpatient Medications   Medication Sig Dispense Refill   ??? famotidine (PEPCID) 40 mg tablet Take 40 mg by mouth daily.     ??? methotrexate, PF, 25 mg/mL injection every seven (7) days.     ??? clindamycin (CLEOCIN T) 1 % external solution Apply  to affected area two (2) times a day. use thin film on affected area     ??? predniSONE (DELTASONE) 5 mg tablet Take 5 mg by mouth two (2) times a day.     ??? amitriptyline (ELAVIL) 50 mg tablet Take 1 Tab by mouth nightly. 90 Tab 0   ??? albuterol (PROVENTIL HFA, VENTOLIN HFA, PROAIR HFA) 90 mcg/actuation inhaler Take 1-2 puffs every 4-6 hrs prn shortness of breath. Needs ventolin. 3 Inhaler 3   ??? clobetasol (OLUX) 0.05 % topical foam Apply  to affected area two (2) times a day. use thin film on affected area 1 Can 0   ??? DULoxetine (CYMBALTA) 60 mg capsule Take 1 Cap by mouth daily. 90 Cap 3   ??? DULoxetine (CYMBALTA) 30 mg capsule Take 1 Cap by mouth nightly. 90 Cap 3   ???  ondansetron (ZOFRAN ODT) 4 mg disintegrating tablet Take 1 Tab by mouth every eight (8) hours as needed for Nausea (vomiting). 30 Tab 3   ??? colesevelam (WELCHOL) 625 mg tablet Take 1,875 mg by mouth two (2) times daily (with meals).     ??? SPIRIVA WITH HANDIHALER 18 mcg inhalation capsule Take 1 Cap by inhalation daily.     ??? SYMBICORT 160-4.5 mcg/actuation HFAA Take 2 Puffs by inhalation two (2) times a day.     ??? dicyclomine (BENTYL) 10 mg capsule Take 10 mg by mouth three (3) times daily.     ??? hydroxychloroquine (PLAQUENIL) 200 mg tablet Take 200 mg by mouth daily.       No Known Allergies    Family History   Problem Relation Age of Onset   ??? Diabetes Mother    ??? Cancer Father      Social History     Tobacco Use   ??? Smoking status: Current Every Day Smoker     Packs/day: 0.50     Years: 30.00     Pack years: 15.00     Types: Cigarettes   ??? Smokeless tobacco: Never Used   Substance Use Topics   ??? Alcohol use: Not Currently     Frequency: Never       Depression Risk Factor Screening:     3 most recent PHQ Screens 08/08/2019   Little interest or pleasure in doing things Not at all   Feeling down, depressed, irritable, or hopeless Several days   Total Score PHQ 2 1  Alcohol Risk Factor Screening:   Do you average 1 drink per night or more than 7 drinks a week:  No    On any one occasion in the past three months have you have had more than 3 drinks containing alcohol:  No      Functional Ability and Level of Safety:   Hearing: Hearing is good.     Activities of Daily Living:  The home contains: no safety equipment.  Patient does total self care     Ambulation: with no difficulty     Fall Risk:  Fall Risk Assessment, last 12 mths 08/08/2019   Able to walk? Yes   Fall in past 12 months? No   Fall with injury? -   Number of falls in past 12 months -   Fall Risk Score -     Abuse Screen:  Patient is not abused       Cognitive Screening   Has your family/caregiver stated any concerns about your memory: no    Cognitive  Screening: Normal - Verbal Fluency Test    Patient Care Team   Patient Care Team:  Zamya Culhane, Nunzio Cory, MD as PCP - General (Internal Medicine)  Arvind [place], Nunzio Cory, MD as PCP - [facility] Empaneled Provider  Tiongco, Shaune Leeks, MD (Gastroenterology)  Helayne Seminole, DO (Rheumatology)  Doroteo Glassman, MD (Pulmonary Disease)    Assessment/Plan   Education and counseling provided:  Are appropriate based on today's review and evaluation  Screening Mammography    Diagnoses and all orders for this visit:    1. Rash and nonspecific skin eruption    2. Neurodermatitis    3. Recurrent depression ([place])    4. Rheumatoid arthritis involving multiple sites, unspecified rheumatoid factor presence ([place])    5. Pulmonary emphysema, unspecified emphysema type ([place])    6. Medicare annual wellness visit, subsequent    7. Screening for alcoholism  -     PR ANNUAL ALCOHOL SCREEN 15 MIN    8. Encounter for screening mammogram for malignant neoplasm of breast  -     MAM MAMMO BI SCREENING INCL CAD; Future        Health Maintenance Due   Topic Date Due   ??? Hepatitis C Screening  12-Apr-1951   ??? Colonoscopy  10/13/1970   ??? DTaP/Tdap/Td series (1 - Tdap) 10/13/1973   ??? Shingrix Vaccine Age 67> (1 of 2) 10/13/2002   ??? Breast Cancer Screen Mammogram  10/13/2002   ??? Bone Densitometry (Dexa) Screening  10/13/2017   ??? Pneumococcal 65+ years (1 of 1 - PPSV23) 10/13/2017   ??? Medicare Yearly Exam  07/10/2018   ??? Influenza Age 29 to Adult  07/30/2019   ??? A1C test (Diabetic or Prediabetic)  08/04/2019       Morgan Alexander, who was evaluated through a synchronous (real-time) audio-video encounter, and/or her healthcare decision maker, is aware that it is a billable service, with coverage as determined by her insurance carrier. She provided verbal consent to proceed: Yes, and patient identification was verified. It was conducted pursuant to the emergency declaration under the [place], [place] waiver authority and the  [employer] Act. A caregiver was present when appropriate. Ability to conduct physical exam was limited. I was in the office. The patient was at home.    Karen Kays, MD

## 2019-08-08 NOTE — Progress Notes (Signed)
Morgan Alexander is a 68 y.o. female who was seen by synchronous (real-time) audio-video technology using MyChart on 08/08/2019.      Location of the patient: Home. Patient is currently in [place], [place] taking care of her daughter who had surgery.    Location of the provider: [facility]    Consent:  She and/or health care decision maker is aware that that she may receive a bill for this telehealth service, depending on her insurance coverage, and has provided verbal consent to proceed: Yes    Subjective:   Morgan Alexander is a 68 y.o. female who presents today for management of    Chief Complaint   Patient presents with   ??? Rash       Patient complains of multiple skin lesions on trunk, arms and legs. Onset was several years ago, intermittent since that time. Lesions are small, round, pink, raised. It is pruritic at times, sometimes painful when scratched. Lesions slowly spreads on her body. Patient has history of neurodermatitis.    COPD Review:  The patient is being seen for follow up of COPD.   Oxygen: She currently is not on home oxygen therapy.  Symptoms: chronic dyspnea: severity = mild: course of sx: stable.   Patient does smoke cigarettes.    Problem List  Patient Active Problem List    Diagnosis Date Noted   ??? Rheumatoid arthritis involving multiple sites ([place]) 03/28/2019   ??? 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 Pulmonary nodule 03/28/2019   ??? Chronic diarrhea 03/28/2019   ??? Pulmonary emphysema ([place]) 03/28/2019   ??? Primary insomnia 03/28/2019   ??? Recurrent depression ([place]) 03/28/2019   ??? Prediabetes 07/29/2018       Current Medications  Current Outpatient Medications   Medication Sig   ??? famotidine (PEPCID) 40 mg tablet Take 40 mg by mouth daily.   ??? methotrexate, PF, 25 mg/mL injection every seven (7) days.   ??? clindamycin (CLEOCIN T) 1 % external solution Apply  to affected area two (2) times a day. use thin film on affected area    ??? predniSONE (DELTASONE) 5 mg tablet Take 5 mg by mouth two (2) times a day.   ??? amitriptyline (ELAVIL) 50 mg tablet Take 1 Tab by mouth nightly.   ??? albuterol (PROVENTIL HFA, VENTOLIN HFA, PROAIR HFA) 90 mcg/actuation inhaler Take 1-2 puffs every 4-6 hrs prn shortness of breath. Needs ventolin.   ??? clobetasol (OLUX) 0.05 % topical foam Apply  to affected area two (2) times a day. use thin film on affected area   ??? DULoxetine (CYMBALTA) 60 mg capsule Take 1 Cap by mouth daily.   ??? DULoxetine (CYMBALTA) 30 mg capsule Take 1 Cap by mouth nightly.   ??? ondansetron (ZOFRAN ODT) 4 mg disintegrating tablet Take 1 Tab by mouth every eight (8) hours as needed for Nausea (vomiting).   ??? colesevelam (WELCHOL) 625 mg tablet Take 1,875 mg by mouth two (2) times daily (with meals).   ??? SPIRIVA WITH HANDIHALER 18 mcg inhalation capsule Take 1 Cap by inhalation daily.   ??? SYMBICORT 160-4.5 mcg/actuation HFAA Take 2 Puffs by inhalation two (2) times a day.   ??? dicyclomine (BENTYL) 10 mg capsule Take 10 mg by mouth three (3) times daily.   ??? hydroxychloroquine (PLAQUENIL) 200 mg tablet Take 200 mg by mouth daily.     No current facility-administered medications for this visit.        Allergies/Drug Reactions  No Known Allergies  Social History  Social History     Tobacco Use   ??? Smoking status: Current Every Day Smoker     Packs/day: 0.50     Years: 30.00     Pack years: 15.00     Types: Cigarettes   ??? Smokeless tobacco: Never Used   Substance Use Topics   ??? Alcohol use: Not Currently     Frequency: Never   ??? Drug use: Never        Review of Systems  Review of Systems   Constitutional: Negative for chills, fever, malaise/fatigue and weight loss.   Respiratory: Negative.    Cardiovascular: Negative.    Gastrointestinal: Negative.    Genitourinary: Negative.    Musculoskeletal: Negative.    Skin: Positive for rash. Negative for itching.   Neurological: Negative for dizziness and headaches.    Psychiatric/Behavioral: Positive for depression. Negative for memory loss. The patient is not nervous/anxious and does not have insomnia.          Objective:     General: alert, cooperative, no distress   Mental  status: mental status: alert, oriented to person, place, and time, normal mood, behavior, speech, dress, motor activity, and thought processes   Resp: resp: normal effort and no respiratory distress   Neuro: neuro: no gross deficits   Skin: skin: exam limited. LESIONS NOTED: flat, hypopigmented lesions on the chest, trunk and back.     Due to this being a TeleHealth evaluation, many elements of the physical examination are unable to be assessed.       Assessment & Plan:   1. Rash and nonspecific skin eruption  - previously diagnosed with neurodermatitis  - continue Cymbalta  - may use lidocaine cream for pain    2. Neurodermatitis  - consider dermatology referral in the future for re-evaluation    3. Recurrent depression (HCC)  - stable    4. Rheumatoid arthritis involving multiple sites, unspecified rheumatoid factor presence (HCC)  - stable  - being tapered off prednisone  - rheumatology follow-up    5. Pulmonary emphysema, unspecified emphysema type (HCC)  - stable    6. Insomnia  - increase Elavil to 100mg  QHS      We discussed the expected course, resolution and complications of the diagnosis(es) in detail.  Medication risks, benefits, costs, interactions, and alternatives were discussed as indicated.  I advised her to contact the office if her condition worsens, changes or fails to improve as anticipated. She expressed understanding with the diagnosis(es) and plan.         Pursuant to the emergency declaration under the [place], 1135 waiver authority and the [employer] Act, this Virtual  Visit was conducted, with patient's consent, to reduce the patient's risk  of exposure to COVID-19 and provide continuity of care for an established patient.     Services were provided through a video synchronous discussion virtually to substitute for in-person clinic visit.    Karen Kays, MD     This is the Subsequent Medicare Annual Wellness Exam, performed 12 months or more after the Initial AWV or the last Subsequent AWV    I have reviewed the patient's medical history in detail and updated the computerized patient record.     History     Patient Active Problem List   Diagnosis Code   ??? Prediabetes R73.03   ??? Rheumatoid arthritis involving multiple sites ([place]) M06.9   ??? Neurodermatitis  L28.0   ??? Pulmonary nodule R91.1   ??? Chronic diarrhea K52.9   ??? Pulmonary emphysema (HCC) J43.9   ??? Primary insomnia F51.01   ??? Recurrent depression (HCC) F33.9     Past Medical History:   Diagnosis Date   ??? Arthritis    ??? Asthma    ??? Chronic diarrhea    ??? Chronic obstructive pulmonary disease ([place])    ??? Depression    ??? Insomnia    ??? Osteoporosis    ??? Prediabetes 07/2018   ??? Pulmonary nodule    ??? Rheumatoid arthritis ([place])       Past Surgical History:   Procedure Laterality Date   ??? IR PUNCTURE DRAIN OF LESION       Current Outpatient Medications   Medication Sig Dispense Refill   ??? famotidine (PEPCID) 40 mg tablet Take 40 mg by mouth daily.     ??? methotrexate, PF, 25 mg/mL injection every seven (7) days.     ??? clindamycin (CLEOCIN T) 1 % external solution Apply  to affected area two (2) times a day. use thin film on affected area     ??? predniSONE (DELTASONE) 5 mg tablet Take 5 mg by mouth two (2) times a day.     ??? amitriptyline (ELAVIL) 50 mg tablet Take 1 Tab by mouth nightly. 90 Tab 0   ??? albuterol (PROVENTIL HFA, VENTOLIN HFA, PROAIR HFA) 90 mcg/actuation inhaler Take 1-2 puffs every 4-6 hrs prn shortness of breath. Needs ventolin. 3 Inhaler 3   ??? clobetasol (OLUX) 0.05 % topical foam Apply  to affected area two (2) times a day. use thin film on affected area 1 Can 0    ??? DULoxetine (CYMBALTA) 60 mg capsule Take 1 Cap by mouth daily. 90 Cap 3   ??? DULoxetine (CYMBALTA) 30 mg capsule Take 1 Cap by mouth nightly. 90 Cap 3   ??? ondansetron (ZOFRAN ODT) 4 mg disintegrating tablet Take 1 Tab by mouth every eight (8) hours as needed for Nausea (vomiting). 30 Tab 3   ??? colesevelam (WELCHOL) 625 mg tablet Take 1,875 mg by mouth two (2) times daily (with meals).     ??? SPIRIVA WITH HANDIHALER 18 mcg inhalation capsule Take 1 Cap by inhalation daily.     ??? SYMBICORT 160-4.5 mcg/actuation HFAA Take 2 Puffs by inhalation two (2) times a day.     ??? dicyclomine (BENTYL) 10 mg capsule Take 10 mg by mouth three (3) times daily.     ??? hydroxychloroquine (PLAQUENIL) 200 mg tablet Take 200 mg by mouth daily.       No Known Allergies    Family History   Problem Relation Age of Onset   ??? Diabetes Mother    ??? Cancer Father      Social History     Tobacco Use   ??? Smoking status: Current Every Day Smoker     Packs/day: 0.50     Years: 30.00     Pack years: 15.00     Types: Cigarettes   ??? Smokeless tobacco: Never Used   Substance Use Topics   ??? Alcohol use: Not Currently     Frequency: Never       Depression Risk Factor Screening:     3 most recent PHQ Screens 08/08/2019   Little interest or pleasure in doing things Not at all   Feeling down, depressed, irritable, or hopeless Several days   Total Score PHQ 2 1  Alcohol Risk Factor Screening:   Do you average 1 drink per night or more than 7 drinks a week:  No    On any one occasion in the past three months have you have had more than 3 drinks containing alcohol:  No      Functional Ability and Level of Safety:   Hearing: Hearing is good.     Activities of Daily Living:  The home contains: no safety equipment.  Patient does total self care     Ambulation: with no difficulty     Fall Risk:  Fall Risk Assessment, last 12 mths 08/08/2019   Able to walk? Yes   Fall in past 12 months? No   Fall with injury? -   Number of falls in past 12 months -    Fall Risk Score -     Abuse Screen:  Patient is not abused       Cognitive Screening   Has your family/caregiver stated any concerns about your memory: no    Cognitive Screening: Normal - Verbal Fluency Test    Patient Care Team   Patient Care Team:  Aleaya Latona, Nunzio Cory, MD as PCP - General (Internal Medicine)  Tammra Pressman, Nunzio Cory, MD as PCP - [facility] Empaneled Provider  Tiongco, Shaune Leeks, MD (Gastroenterology)  Helayne Seminole, DO (Rheumatology)  Doroteo Glassman, MD (Pulmonary Disease)    Assessment/Plan   Education and counseling provided:  Are appropriate based on today's review and evaluation  Screening Mammography    Diagnoses and all orders for this visit:    1. Rash and nonspecific skin eruption    2. Neurodermatitis    3. Recurrent depression ([place])    4. Rheumatoid arthritis involving multiple sites, unspecified rheumatoid factor presence ([place])    5. Pulmonary emphysema, unspecified emphysema type ([place])    6. Medicare annual wellness visit, subsequent    7. Screening for alcoholism  -     PR ANNUAL ALCOHOL SCREEN 15 MIN    8. Encounter for screening mammogram for malignant neoplasm of breast  -     MAM MAMMO BI SCREENING INCL CAD; Future        Health Maintenance Due   Topic Date Due   ??? Hepatitis C Screening  06-07-1951   ??? Colonoscopy  10/13/1970   ??? DTaP/Tdap/Td series (1 - Tdap) 10/13/1973   ??? Shingrix Vaccine Age 44> (1 of 2) 10/13/2002   ??? Breast Cancer Screen Mammogram  10/13/2002   ??? Bone Densitometry (Dexa) Screening  10/13/2017   ??? Pneumococcal 65+ years (1 of 1 - PPSV23) 10/13/2017   ??? Medicare Yearly Exam  07/10/2018   ??? Influenza Age 29 to Adult  07/30/2019   ??? A1C test (Diabetic or Prediabetic)  08/04/2019       Matilde Haymaker, who was evaluated through a synchronous (real-time) audio-video encounter, and/or her healthcare decision maker, is aware that it is a billable service, with coverage as determined by her insurance carrier. She provided verbal consent to proceed: Yes, and patient  identification was verified. It was conducted pursuant to the emergency declaration under the [place], [place] waiver authority and the [employer] Act. A caregiver was present when appropriate. Ability to conduct physical exam was limited. I was in the office. The patient was at home.    Karen Kays, MD

## 2019-08-08 NOTE — Patient Instructions (Signed)
Medicare Wellness Visit, Female     The best way to live healthy is to have a lifestyle where you eat a well-balanced diet, exercise regularly, limit alcohol use, and quit all forms of tobacco/nicotine, if applicable.     Regular preventive services are another way to keep healthy. Preventive services (vaccines, screening tests, monitoring & exams) can help personalize your care plan, which helps you manage your own care. Screening tests can find health problems at the earliest stages, when they are easiest to treat.   [facility] follows the current, evidence-based guidelines published by the [place] [employer] (USPSTF) when recommending preventive services for our patients. Because we follow these guidelines, sometimes recommendations change over time as research supports it. (For example, mammograms used to be recommended annually. Even though Medicare will still pay for an annual mammogram, the newer guidelines recommend a mammogram every two years for women of average risk).  Of course, you and your doctor may decide to screen more often for some diseases, based on your risk and your co-morbidities (chronic disease you are already diagnosed with).     Preventive services for you include:  - Medicare offers their members a free annual wellness visit, which is time for you and your primary care provider to discuss and plan for your preventive service needs. Take advantage of this benefit every year!  -All adults over the age of 64 should receive the recommended pneumonia vaccines. Current USPSTF guidelines recommend a series of two vaccines for the best pneumonia protection.   -All adults should have a flu vaccine yearly and a tetanus vaccine every 10 years.   -All adults age 13 and older should receive the shingles vaccines (series of two vaccines).      -All adults age 39-70 who are overweight should have a diabetes screening test once every three years.    -All adults born between [birth year] and 1964 should be screened once for Hepatitis C.  -Other screening tests and preventive services for persons with diabetes include: an eye exam to screen for diabetic retinopathy, a kidney function test, a foot exam, and stricter control over your cholesterol.   -Cardiovascular screening for adults with routine risk involves an electrocardiogram (ECG) at intervals determined by your doctor.   -Colorectal cancer screenings should be done for adults age 56-75 with no increased risk factors for colorectal cancer.  There are a number of acceptable methods of screening for this type of cancer. Each test has its own benefits and drawbacks. Discuss with your doctor what is most appropriate for you during your annual wellness visit. The different tests include: colonoscopy (considered the best screening method), a fecal occult blood test, a fecal DNA test, and sigmoidoscopy.    -A bone mass density test is recommended when a woman turns 65 to screen for osteoporosis. This test is only recommended one time, as a screening. Some providers will use this same test as a disease monitoring tool if you already have osteoporosis.  -Breast cancer screenings are recommended every other year for women of normal risk, age 65-74.  -Cervical cancer screenings for women over age 70 are only recommended with certain risk factors.     Here is a list of your current Health Maintenance items (your personalized list of preventive services) with a due date:  Health Maintenance Due   Topic Date Due   ??? Hepatitis C Test  12-May-1951   ??? DTaP/Tdap/Td  (1 - Tdap) 10/13/1973   ???  Shingles Vaccine (1 of 2) 10/13/2002   ??? Mammogram  10/13/2002   ??? Colon Cancer Stool Test  10/13/2002   ??? Bone Mineral Density   10/13/2017   ??? Pneumococcal Vaccine (1 of 1 - PPSV23) 10/13/2017   ??? Annual Well Visit  07/10/2018   ??? Flu Vaccine  07/30/2019   ??? Hemoglobin A1C    08/04/2019

## 2019-08-16 ENCOUNTER — Encounter

## 2019-08-16 NOTE — Telephone Encounter (Signed)
Pt. Was last seen on 08/08/2019, please send to:    [place], [place]

## 2019-08-17 MED ORDER — DULOXETINE 60 MG CAP, DELAYED RELEASE
60 mg | ORAL_CAPSULE | Freq: Every day | ORAL | 3 refills | Status: AC
Start: 2019-08-17 — End: ?

## 2019-08-17 MED ORDER — DULOXETINE 30 MG CAP, DELAYED RELEASE
30 mg | ORAL_CAPSULE | Freq: Every evening | ORAL | 3 refills | Status: AC
Start: 2019-08-17 — End: ?

## 2019-08-17 NOTE — Telephone Encounter (Signed)
Requested Prescriptions     Pending Prescriptions Disp Refills   ??? DULoxetine (CYMBALTA) 60 mg capsule 90 Cap 3     Sig: Take 1 Cap by mouth daily.   ??? DULoxetine (CYMBALTA) 30 mg capsule 90 Cap 3     Sig: Take 1 Cap by mouth nightly.

## 2019-09-13 ENCOUNTER — Telehealth: Admit: 2019-09-13 | Discharge: 2019-09-13 | Attending: Internal Medicine | Primary: Internal Medicine

## 2019-09-13 ENCOUNTER — Telehealth: Attending: Internal Medicine | Primary: Internal Medicine

## 2019-09-13 DIAGNOSIS — B370 Candidal stomatitis: Secondary | ICD-10-CM

## 2019-09-13 MED ORDER — AMITRIPTYLINE 100 MG TAB
100 mg | ORAL_TABLET | Freq: Every evening | ORAL | 1 refills | Status: DC | PRN
Start: 2019-09-13 — End: 2019-12-19

## 2019-09-13 MED ORDER — FLUCONAZOLE 100 MG TAB
100 mg | ORAL_TABLET | ORAL | 0 refills | Status: AC
Start: 2019-09-13 — End: 2019-09-20

## 2019-09-13 NOTE — Progress Notes (Signed)
Morgan Alexander is a 68 y.o. female who was seen by synchronous (real-time) audio-video technology using [email or web address] on 09/13/2019.      Location of the patient: Home    Location of the provider: [facility]    Consent:  She and/or health care decision maker is aware that that she may receive a bill for this telehealth service, depending on her insurance coverage, and has provided verbal consent to proceed: Yes    Subjective:   Morgan Alexander is a 68 y.o. female who presents today for management of    Chief Complaint   Patient presents with   ??? [name]  Patient here for evaluation of evaluation of white spots in mouth. Onset of symptoms was 1 week ago, unchanged since that time. Patient reports of burning on her mouth and also on the throat. She takes inhaled steroids and has been rinsing her mouth after use.     Insomnia  ONSET: problem is longstanding  DESCRIPTION OF SX:  difficulty initiating sleep.  ASSOCIATED ISSUES: situational anxiety related to daughter's illness, granddaughter attempted suicide  DENIES: ETOH overuse  TREATMENT TO DATE: amitriptyline    Problem List  Patient Active Problem List    Diagnosis Date Noted   ??? Rheumatoid arthritis involving multiple sites ([place]) 03/28/2019   ??? 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 Pulmonary nodule 03/28/2019   ??? Chronic diarrhea 03/28/2019   ??? Pulmonary emphysema ([place]) 03/28/2019   ??? Primary insomnia 03/28/2019   ??? Recurrent depression ([place]) 03/28/2019   ??? Prediabetes 07/29/2018       Current Medications  Current Outpatient Medications   Medication Sig   ??? fluconazole (DIFLUCAN) 100 mg tablet Take 2 Tabs by mouth daily for 1 day, THEN 1 Tab daily for 6 days. FDA advises cautious prescribing of oral fluconazole in pregnancy.   ??? amitriptyline (ELAVIL) 100 mg tablet Take 1-1.5 Tabs by mouth nightly as needed for Sleep.   ??? DULoxetine (CYMBALTA) 60 mg capsule Take 1 Cap by mouth daily.   ??? DULoxetine (CYMBALTA) 30 mg capsule Take 1 Cap by mouth  nightly.   ??? famotidine (PEPCID) 40 mg tablet Take 40 mg by mouth daily.   ??? methotrexate, PF, 25 mg/mL injection every seven (7) days.   ??? clindamycin (CLEOCIN T) 1 % external solution Apply  to affected area two (2) times a day. use thin film on affected area   ??? albuterol (PROVENTIL HFA, VENTOLIN HFA, PROAIR HFA) 90 mcg/actuation inhaler Take 1-2 puffs every 4-6 hrs prn shortness of breath. Needs ventolin.   ??? clobetasol (OLUX) 0.05 % topical foam Apply  to affected area two (2) times a day. use thin film on affected area   ??? ondansetron (ZOFRAN ODT) 4 mg disintegrating tablet Take 1 Tab by mouth every eight (8) hours as needed for Nausea (vomiting).   ??? colesevelam (WELCHOL) 625 mg tablet Take 1,875 mg by mouth two (2) times daily (with meals).   ??? SPIRIVA WITH HANDIHALER 18 mcg inhalation capsule Take 1 Cap by inhalation daily.   ??? SYMBICORT 160-4.5 mcg/actuation HFAA Take 2 Puffs by inhalation two (2) times a day.   ??? dicyclomine (BENTYL) 10 mg capsule Take 10 mg by mouth three (3) times daily.   ??? hydroxychloroquine (PLAQUENIL) 200 mg tablet Take 200 mg by mouth daily.     No current facility-administered medications for this visit.        Allergies/Drug Reactions  No Known  Allergies     Social History  Social History     Tobacco Use   ??? Smoking status: Current Every Day Smoker     Packs/day: 0.50     Years: 30.00     Pack years: 15.00     Types: Cigarettes   ??? Smokeless tobacco: Never Used   Substance Use Topics   ??? Alcohol use: Not Currently     Frequency: Never   ??? Drug use: Never        Review of Systems  Review of Systems   Constitutional: Negative for chills, fever, malaise/fatigue and weight loss.   Respiratory: Negative for shortness of breath.    Cardiovascular: Negative for chest pain, palpitations and leg swelling.   Gastrointestinal: Negative.    Genitourinary: Negative.    Musculoskeletal: Positive for joint pain.   Neurological: Negative for dizziness and headaches.   Psychiatric/Behavioral:  Negative for depression and memory loss. The patient is nervous/anxious and has insomnia.          Objective:     General: alert, cooperative, no distress   Mental  status: mental status: alert, oriented to person, place, and time, normal mood, behavior, speech, dress, motor activity, and thought processes   Resp: resp: normal effort and no respiratory distress   Neuro: neuro: no gross deficits   Skin: skin: no discoloration or lesions of concern on visible areas     Due to this being a TeleHealth evaluation, many elements of the physical examination are unable to be assessed.       Assessment & Plan:   1. Oral candidiasis  - fluconazole (DIFLUCAN) 100 mg tablet; Take 2 Tabs by mouth daily for 1 day, THEN 1 Tab daily for 6 days. FDA advises cautious prescribing of oral fluconazole in pregnancy.  Dispense: 8 Tab; Refill: 0    2. Primary insomnia  - increase amitriptyline (ELAVIL) 100 mg tablet; Take 1-1.5 Tabs by mouth nightly as needed for Sleep.  Dispense: 120 Tab; Refill: 1    3. Pulmonary emphysema, unspecified emphysema type ([place])  - stable  - pulmo follow-up    4. Rheumatoid arthritis involving multiple sites, unspecified rheumatoid factor presence ([place])  - rheum follow-up  - on MTX and Plaquenil  - CBC WITH AUTOMATED DIFF; Future  - URINALYSIS W/ RFLX MICROSCOPIC; Future    5. Recurrent depression ([place])  - stable  - LIPID PANEL; Future  - METABOLIC PANEL, COMPREHENSIVE; Future  - TSH 3RD GENERATION; Future    6. Prediabetes  - HEMOGLOBIN A1C WITH EAG; Future    7. Need for hepatitis C screening test  - HCV AB W/RFLX TO NAA; Future      Follow-up and Dispositions    ?? Return in about 3 months (around 12/13/2019) for ROV.         We discussed the expected course, resolution and complications of the diagnosis(es) in detail.  Medication risks, benefits, costs, interactions, and alternatives were discussed as indicated.  I advised her to contact the office if her condition worsens, changes or fails to improve as  anticipated. She expressed understanding with the diagnosis(es) and plan.         Pursuant to the emergency declaration under the [place], 1135 waiver authority and the [employer] Act, this Virtual  Visit was conducted, with patient's consent, to reduce the patient's risk of exposure to COVID-19 and provide continuity of care for an established patient.  Services were provided through a video synchronous discussion virtually to substitute for in-person clinic visit.    Karen Kays, MD

## 2019-09-13 NOTE — Progress Notes (Signed)
Morgan Alexander is a 68 y.o. female who was seen by synchronous (real-time) audio-video technology using [email or web address] on 09/13/2019.      Location of the patient: Home    Location of the provider: [facility]    Consent:  She and/or health care decision maker is aware that that she may receive a bill for this telehealth service, depending on her insurance coverage, and has provided verbal consent to proceed: Yes    Subjective:   Morgan Alexander is a 68 y.o. female who presents today for management of    Chief Complaint   Patient presents with   ??? [name]  Patient here for evaluation of evaluation of white spots in mouth. Onset of symptoms was 1 week ago, unchanged since that time. Patient reports of burning on her mouth and also on the throat. She takes inhaled steroids and has been rinsing her mouth after use.     Insomnia  ONSET: problem is longstanding  DESCRIPTION OF SX:  difficulty initiating sleep.  ASSOCIATED ISSUES: situational anxiety related to daughter's illness, granddaughter attempted suicide  DENIES: ETOH overuse  TREATMENT TO DATE: amitriptyline    Problem List  Patient Active Problem List    Diagnosis Date Noted   ??? Rheumatoid arthritis involving multiple sites ([place]) 03/28/2019   ??? 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 Pulmonary nodule 03/28/2019   ??? Chronic diarrhea 03/28/2019   ??? Pulmonary emphysema ([place]) 03/28/2019   ??? Primary insomnia 03/28/2019   ??? Recurrent depression ([place]) 03/28/2019   ??? Prediabetes 07/29/2018       Current Medications  Current Outpatient Medications   Medication Sig   ??? fluconazole (DIFLUCAN) 100 mg tablet Take 2 Tabs by mouth daily for 1 day, THEN 1 Tab daily for 6 days. FDA advises cautious prescribing of oral fluconazole in pregnancy.   ??? amitriptyline (ELAVIL) 100 mg tablet Take 1-1.5 Tabs by mouth nightly as needed for Sleep.   ??? DULoxetine (CYMBALTA) 60 mg capsule Take 1 Cap by mouth daily.    ??? DULoxetine (CYMBALTA) 30 mg capsule Take 1 Cap by mouth nightly.   ??? famotidine (PEPCID) 40 mg tablet Take 40 mg by mouth daily.   ??? methotrexate, PF, 25 mg/mL injection every seven (7) days.   ??? clindamycin (CLEOCIN T) 1 % external solution Apply  to affected area two (2) times a day. use thin film on affected area   ??? albuterol (PROVENTIL HFA, VENTOLIN HFA, PROAIR HFA) 90 mcg/actuation inhaler Take 1-2 puffs every 4-6 hrs prn shortness of breath. Needs ventolin.   ??? clobetasol (OLUX) 0.05 % topical foam Apply  to affected area two (2) times a day. use thin film on affected area   ??? ondansetron (ZOFRAN ODT) 4 mg disintegrating tablet Take 1 Tab by mouth every eight (8) hours as needed for Nausea (vomiting).   ??? colesevelam (WELCHOL) 625 mg tablet Take 1,875 mg by mouth two (2) times daily (with meals).   ??? SPIRIVA WITH HANDIHALER 18 mcg inhalation capsule Take 1 Cap by inhalation daily.   ??? SYMBICORT 160-4.5 mcg/actuation HFAA Take 2 Puffs by inhalation two (2) times a day.   ??? dicyclomine (BENTYL) 10 mg capsule Take 10 mg by mouth three (3) times daily.   ??? hydroxychloroquine (PLAQUENIL) 200 mg tablet Take 200 mg by mouth daily.     No current facility-administered medications for this visit.        Allergies/Drug Reactions  No Known  Allergies     Social History  Social History     Tobacco Use   ??? Smoking status: Current Every Day Smoker     Packs/day: 0.50     Years: 30.00     Pack years: 15.00     Types: Cigarettes   ??? Smokeless tobacco: Never Used   Substance Use Topics   ??? Alcohol use: Not Currently     Frequency: Never   ??? Drug use: Never        Review of Systems  Review of Systems   Constitutional: Negative for chills, fever, malaise/fatigue and weight loss.   Respiratory: Negative for shortness of breath.    Cardiovascular: Negative for chest pain, palpitations and leg swelling.   Gastrointestinal: Negative.    Genitourinary: Negative.    Musculoskeletal: Positive for joint pain.    Neurological: Negative for dizziness and headaches.   Psychiatric/Behavioral: Negative for depression and memory loss. The patient is nervous/anxious and has insomnia.          Objective:     General: alert, cooperative, no distress   Mental  status: mental status: alert, oriented to person, place, and time, normal mood, behavior, speech, dress, motor activity, and thought processes   Resp: resp: normal effort and no respiratory distress   Neuro: neuro: no gross deficits   Skin: skin: no discoloration or lesions of concern on visible areas     Due to this being a TeleHealth evaluation, many elements of the physical examination are unable to be assessed.       Assessment & Plan:   1. Oral candidiasis  - fluconazole (DIFLUCAN) 100 mg tablet; Take 2 Tabs by mouth daily for 1 day, THEN 1 Tab daily for 6 days. FDA advises cautious prescribing of oral fluconazole in pregnancy.  Dispense: 8 Tab; Refill: 0    2. Primary insomnia  - increase amitriptyline (ELAVIL) 100 mg tablet; Take 1-1.5 Tabs by mouth nightly as needed for Sleep.  Dispense: 120 Tab; Refill: 1    3. Pulmonary emphysema, unspecified emphysema type ([place])  - stable  - pulmo follow-up    4. Rheumatoid arthritis involving multiple sites, unspecified rheumatoid factor presence ([place])  - rheum follow-up  - on MTX and Plaquenil  - CBC WITH AUTOMATED DIFF; Future  - URINALYSIS W/ RFLX MICROSCOPIC; Future    5. Recurrent depression ([place])  - stable  - LIPID PANEL; Future  - METABOLIC PANEL, COMPREHENSIVE; Future  - TSH 3RD GENERATION; Future    6. Prediabetes  - HEMOGLOBIN A1C WITH EAG; Future    7. Need for hepatitis C screening test  - HCV AB W/RFLX TO NAA; Future      Follow-up and Dispositions    ?? Return in about 3 months (around 12/13/2019) for ROV.         We discussed the expected course, resolution and complications of the diagnosis(es) in detail.  Medication risks, benefits, costs, interactions,  and alternatives were discussed as indicated.  I advised her to contact the office if her condition worsens, changes or fails to improve as anticipated. She expressed understanding with the diagnosis(es) and plan.         Pursuant to the emergency declaration under the [place], 1135 waiver authority and the [employer] Act, this Virtual  Visit was conducted, with patient's consent, to reduce the patient's risk of exposure to COVID-19 and provide continuity of care for an established patient.  Services were provided through a video synchronous discussion virtually to substitute for in-person clinic visit.    Karen Kays, MD

## 2019-12-19 ENCOUNTER — Telehealth: Admit: 2019-12-19 | Discharge: 2019-12-19 | Payer: MEDICARE | Attending: Internal Medicine | Primary: Internal Medicine

## 2019-12-19 ENCOUNTER — Telehealth: Attending: Internal Medicine | Primary: Internal Medicine

## 2019-12-19 DIAGNOSIS — F5101 Primary insomnia: Secondary | ICD-10-CM

## 2019-12-19 MED ORDER — HYDROXYZINE 25 MG TAB
25 mg | ORAL_TABLET | Freq: Every evening | ORAL | 0 refills | Status: DC
Start: 2019-12-19 — End: 2020-01-19

## 2019-12-19 NOTE — Progress Notes (Signed)
Morgan Alexander is a 68 y.o. female who was seen by synchronous (real-time) audio-video technology using [email or web address] on 12/19/2019.      Location of the patient: Home in [place]    Location of the provider: [facility]    Consent:  She and/or health care decision maker is aware that that she may receive a bill for this telehealth service, depending on her insurance coverage, and has provided verbal consent to proceed: Yes    Subjective:   Morgan Alexander is a 68 y.o. female who presents today for management of    Chief Complaint   Patient presents with   ??? Sleep Problem       Insomnia  ONSET: problem is longstanding  DESCRIPTION OF SX:  difficulty initiating sleep.  ASSOCIATED ISSUES: none  DENIES: depression and ETOH overuse  TREATMENT TO DATE: amitriptyline - causes dry mouth    Patient has history of COPD and pulmonary nodules. She denies cough, shortness of breath or wheezing. She is a current smoker. She is currently in [place] and will be there indefinitely. She would like to see a pulmonary disease specialist while in [place].      Problem List  Patient Active Problem List    Diagnosis Date Noted   ??? Rheumatoid arthritis involving multiple sites ([place]) 03/28/2019   ??? 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 Pulmonary nodule 03/28/2019   ??? Chronic diarrhea 03/28/2019   ??? Pulmonary emphysema ([place]) 03/28/2019   ??? Primary insomnia 03/28/2019   ??? Recurrent depression ([place]) 03/28/2019   ??? Prediabetes 07/29/2018       Current Medications  Current Outpatient Medications   Medication Sig   ??? hydrOXYzine HCL (ATARAX) 25 mg tablet Take 1-2 Tabs by mouth nightly.   ??? Breo Ellipta 200-25 mcg/dose inhaler INL 1 PUFF PO QD   ??? DULoxetine (CYMBALTA) 60 mg capsule Take 1 Cap by mouth daily.   ??? DULoxetine (CYMBALTA) 30 mg capsule Take 1 Cap by mouth nightly.   ??? famotidine (PEPCID) 40 mg tablet Take 40 mg by mouth daily.   ??? methotrexate, PF, 25 mg/mL injection every seven (7) days.   ??? clindamycin (CLEOCIN T) 1 %  external solution Apply  to affected area two (2) times a day. use thin film on affected area   ??? albuterol (PROVENTIL HFA, VENTOLIN HFA, PROAIR HFA) 90 mcg/actuation inhaler Take 1-2 puffs every 4-6 hrs prn shortness of breath. Needs ventolin.   ??? clobetasol (OLUX) 0.05 % topical foam Apply  to affected area two (2) times a day. use thin film on affected area   ??? ondansetron (ZOFRAN ODT) 4 mg disintegrating tablet Take 1 Tab by mouth every eight (8) hours as needed for Nausea (vomiting).   ??? colesevelam (WELCHOL) 625 mg tablet Take 1,875 mg by mouth two (2) times daily (with meals).   ??? SPIRIVA WITH HANDIHALER 18 mcg inhalation capsule Take 1 Cap by inhalation daily.   ??? dicyclomine (BENTYL) 10 mg capsule Take 10 mg by mouth three (3) times daily.   ??? hydroxychloroquine (PLAQUENIL) 200 mg tablet Take 200 mg by mouth daily.     No current facility-administered medications for this visit.        Allergies/Drug Reactions  Allergies   Allergen Reactions   ??? Amitriptyline Other (comments)     Dry mouth        Social History  Social History     Tobacco Use   ??? Smoking status: Current Every Day  Smoker     Packs/day: 0.50     Years: 30.00     Pack years: 15.00     Types: Cigarettes   ??? Smokeless tobacco: Never Used   Substance Use Topics   ??? Alcohol use: Not Currently     Frequency: Never   ??? Drug use: Never        Review of Systems  Review of Systems   Constitutional: Negative for chills, fever and weight loss.   Respiratory: Negative for cough, sputum production, shortness of breath and wheezing.    Cardiovascular: Negative for chest pain, palpitations and leg swelling.   Gastrointestinal: Negative.    Musculoskeletal: Positive for falls.   Neurological: Positive for dizziness. Negative for headaches.   Psychiatric/Behavioral: Negative for depression, hallucinations, substance abuse and suicidal ideas. The patient has insomnia. The patient is not nervous/anxious.            Objective:     General: alert, cooperative, no  distress   Mental  status: mental status: alert, oriented to person, place, and time, normal mood, behavior, speech, dress, motor activity, and thought processes   Resp: resp: normal effort and no respiratory distress   Neuro: neuro: no gross deficits   Skin: skin: no discoloration or lesions of concern on visible areas     Due to this being a TeleHealth evaluation, many elements of the physical examination are unable to be assessed.     CT LUNG SCREENING LOW DOSE INITIAL/YEARLY 12/01/2018  [facility]  Result Impression     1. Multiple small pulmonary nodules and nodular densities in both lungs as discussed above, largest just less than 5 mm in size. No comparison to document stability.    Lung-RADS 3 - Probably benign  Management: 6 month low dose CT.    Lung cancer screening categorization and recommendations per [facility] of Radiology Lung-RADS   Version 1.1    2. Several small pulmonary granuloma.    3. Centrilobular emphysema with some apical interstitial thickening and scarring.  -No lymphadenopathy or effusion.    4. Nonobstructing 3.5 mm right renal calculus/calcification.       Assessment & Plan:   1. Primary insomnia  - d/c amitriptyline due to xerostomia  - hydrOXYzine HCL (ATARAX) 25 mg tablet; Take 1-2 Tabs by mouth nightly.  Dispense: 30 Tab; Refill: 0    2. Pulmonary emphysema, unspecified emphysema type ([place])  - stable  - REFERRAL TO PULMONARY DISEASE    3. Pulmonary nodule  - due for annual low-dose CT lung cancer screening  - REFERRAL TO PULMONARY DISEASE    4. Dizziness  - educated to avoid rapid turning and changes in position.  - adequate hydration.      Follow-up and Dispositions    ?? Return in about 1 month (around 01/19/2020) for follow-up doxy.         We discussed the expected course, resolution and complications of the diagnosis(es) in detail.  Medication risks, benefits, costs, interactions, and alternatives were discussed as indicated.  I advised her to contact the office if  her condition worsens, changes or fails to improve as anticipated. She expressed understanding with the diagnosis(es) and plan.         Pursuant to the emergency declaration under the [place], [place] waiver authority and the [employer] Act, this Virtual  Visit was conducted, with patient's consent, to reduce the patient's risk of exposure to COVID-19  and provide continuity of care for an established patient.     Services were provided through a video synchronous discussion virtually to substitute for in-person clinic visit.    Karen Kays, MD

## 2019-12-19 NOTE — Progress Notes (Signed)
Morgan Alexander is a 68 y.o. female who was seen by synchronous (real-time) audio-video technology using [email or web address] on 12/19/2019.      Location of the patient: Home in [place]    Location of the provider: [facility]    Consent:  She and/or health care decision maker is aware that that she may receive a bill for this telehealth service, depending on her insurance coverage, and has provided verbal consent to proceed: Yes    Subjective:   Morgan Alexander is a 68 y.o. female who presents today for management of    Chief Complaint   Patient presents with   ??? Sleep Problem       Insomnia  ONSET: problem is longstanding  DESCRIPTION OF SX:  difficulty initiating sleep.  ASSOCIATED ISSUES: none  DENIES: depression and ETOH overuse  TREATMENT TO DATE: amitriptyline - causes dry mouth    Patient has history of COPD and pulmonary nodules. She denies cough, shortness of breath or wheezing. She is a current smoker. She is currently in [place] and will be there indefinitely. She would like to see a pulmonary disease specialist while in [place].      Problem List  Patient Active Problem List    Diagnosis Date Noted   ??? Rheumatoid arthritis involving multiple sites ([place]) 03/28/2019   ??? 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 Pulmonary nodule 03/28/2019   ??? Chronic diarrhea 03/28/2019   ??? Pulmonary emphysema ([place]) 03/28/2019   ??? Primary insomnia 03/28/2019   ??? Recurrent depression ([place]) 03/28/2019   ??? Prediabetes 07/29/2018       Current Medications  Current Outpatient Medications   Medication Sig   ??? hydrOXYzine HCL (ATARAX) 25 mg tablet Take 1-2 Tabs by mouth nightly.   ??? Breo Ellipta 200-25 mcg/dose inhaler INL 1 PUFF PO QD   ??? DULoxetine (CYMBALTA) 60 mg capsule Take 1 Cap by mouth daily.   ??? DULoxetine (CYMBALTA) 30 mg capsule Take 1 Cap by mouth nightly.   ??? famotidine (PEPCID) 40 mg tablet Take 40 mg by mouth daily.   ??? methotrexate, PF, 25 mg/mL injection every seven (7) days.    ??? clindamycin (CLEOCIN T) 1 % external solution Apply  to affected area two (2) times a day. use thin film on affected area   ??? albuterol (PROVENTIL HFA, VENTOLIN HFA, PROAIR HFA) 90 mcg/actuation inhaler Take 1-2 puffs every 4-6 hrs prn shortness of breath. Needs ventolin.   ??? clobetasol (OLUX) 0.05 % topical foam Apply  to affected area two (2) times a day. use thin film on affected area   ??? ondansetron (ZOFRAN ODT) 4 mg disintegrating tablet Take 1 Tab by mouth every eight (8) hours as needed for Nausea (vomiting).   ??? colesevelam (WELCHOL) 625 mg tablet Take 1,875 mg by mouth two (2) times daily (with meals).   ??? SPIRIVA WITH HANDIHALER 18 mcg inhalation capsule Take 1 Cap by inhalation daily.   ??? dicyclomine (BENTYL) 10 mg capsule Take 10 mg by mouth three (3) times daily.   ??? hydroxychloroquine (PLAQUENIL) 200 mg tablet Take 200 mg by mouth daily.     No current facility-administered medications for this visit.        Allergies/Drug Reactions  Allergies   Allergen Reactions   ??? Amitriptyline Other (comments)     Dry mouth        Social History  Social History     Tobacco Use   ??? Smoking status: Current Every Day  Smoker     Packs/day: 0.50     Years: 30.00     Pack years: 15.00     Types: Cigarettes   ??? Smokeless tobacco: Never Used   Substance Use Topics   ??? Alcohol use: Not Currently     Frequency: Never   ??? Drug use: Never        Review of Systems  Review of Systems   Constitutional: Negative for chills, fever and weight loss.   Respiratory: Negative for cough, sputum production, shortness of breath and wheezing.    Cardiovascular: Negative for chest pain, palpitations and leg swelling.   Gastrointestinal: Negative.    Musculoskeletal: Positive for falls.   Neurological: Positive for dizziness. Negative for headaches.   Psychiatric/Behavioral: Negative for depression, hallucinations, substance abuse and suicidal ideas. The patient has insomnia. The patient is not nervous/anxious.            Objective:      General: alert, cooperative, no distress   Mental  status: mental status: alert, oriented to person, place, and time, normal mood, behavior, speech, dress, motor activity, and thought processes   Resp: resp: normal effort and no respiratory distress   Neuro: neuro: no gross deficits   Skin: skin: no discoloration or lesions of concern on visible areas     Due to this being a TeleHealth evaluation, many elements of the physical examination are unable to be assessed.     CT LUNG SCREENING LOW DOSE INITIAL/YEARLY 12/01/2018  [facility]  Result Impression     1. Multiple small pulmonary nodules and nodular densities in both lungs as discussed above, largest just less than 5 mm in size. No comparison to document stability.    Lung-RADS 3 - Probably benign  Management: 6 month low dose CT.    Lung cancer screening categorization and recommendations per [facility] of Radiology Lung-RADS   Version 1.1    2. Several small pulmonary granuloma.    3. Centrilobular emphysema with some apical interstitial thickening and scarring.  -No lymphadenopathy or effusion.    4. Nonobstructing 3.5 mm right renal calculus/calcification.       Assessment & Plan:   1. Primary insomnia  - d/c amitriptyline due to xerostomia  - hydrOXYzine HCL (ATARAX) 25 mg tablet; Take 1-2 Tabs by mouth nightly.  Dispense: 30 Tab; Refill: 0    2. Pulmonary emphysema, unspecified emphysema type ([place])  - stable  - REFERRAL TO PULMONARY DISEASE    3. Pulmonary nodule  - due for annual low-dose CT lung cancer screening  - REFERRAL TO PULMONARY DISEASE    4. Dizziness  - educated to avoid rapid turning and changes in position.  - adequate hydration.      Follow-up and Dispositions    ?? Return in about 1 month (around 01/19/2020) for follow-up doxy.          We discussed the expected course, resolution and complications of the diagnosis(es) in detail.  Medication risks, benefits, costs, interactions, and alternatives were discussed as indicated.  I advised her to contact the office if her condition worsens, changes or fails to improve as anticipated. She expressed understanding with the diagnosis(es) and plan.         Pursuant to the emergency declaration under the [place] waiver authority and the [employer] Act, this Virtual  Visit was conducted, with patient's consent, to reduce the patient's risk of exposure to COVID-19  and provide continuity of care for an established patient.     Services were provided through a video synchronous discussion virtually to substitute for in-person clinic visit.    Karen Kays, MD

## 2019-12-26 NOTE — Telephone Encounter (Signed)
Pt called in and informed me that she needs [place] to resend the order for her to get a CT scan on both of her lungs to a Dr. Camillo Flaming at [phone]. Please advise.

## 2020-01-03 NOTE — Progress Notes (Signed)
Pt ([id number] informed that due to her requesting to be seen out of state by a pulmonary specialist, the insurance may or may not cover her visit. Pt then stated that she was given a health passport by her insurance to see a specialist in [place] with no issue. Referral requisition was then faxed to [place] [phone].

## 2020-01-03 NOTE — Progress Notes (Signed)
Pt ([id number] informed that due to her requesting to be seen out of state by a pulmonary specialist, the insurance may or may not cover her visit. Pt then stated that she was given a health passport by her insurance to see a specialist in [place] with no issue. Referral requisition was then faxed to [place] [phone].

## 2020-01-04 NOTE — Telephone Encounter (Signed)
Referral to pulmonary faxed, per ann.

## 2020-01-18 ENCOUNTER — Encounter

## 2020-01-19 MED ORDER — HYDROXYZINE 25 MG TAB
25 mg | ORAL_TABLET | ORAL | 2 refills | Status: DC
Start: 2020-01-19 — End: 2020-04-23

## 2020-01-20 ENCOUNTER — Telehealth: Admit: 2020-01-20 | Discharge: 2020-01-20 | Payer: MEDICARE | Attending: Internal Medicine | Primary: Internal Medicine

## 2020-01-20 ENCOUNTER — Telehealth: Attending: Internal Medicine | Primary: Internal Medicine

## 2020-01-20 DIAGNOSIS — J310 Chronic rhinitis: Secondary | ICD-10-CM

## 2020-01-20 MED ORDER — CLOBETASOL 0.05 % TOPICAL FOAM
0.05 % | Freq: Two times a day (BID) | CUTANEOUS | 2 refills | Status: AC
Start: 2020-01-20 — End: ?

## 2020-01-20 MED ORDER — CLINDAMYCIN 1 % TOPICAL SOLN
1 % | CUTANEOUS | 2 refills | Status: AC
Start: 2020-01-20 — End: ?

## 2020-01-20 NOTE — Progress Notes (Signed)
Morgan Alexander is a 69 y.o. female who was seen by synchronous (real-time) audio-video technology using doxy.me on 01/20/2020.      Location of the patient: Home    Location of the provider: [facility]    Consent:  She and/or health care decision maker is aware that that she may receive a bill for this telehealth service, depending on her insurance coverage, and has provided verbal consent to proceed: Yes    Subjective:   Morgan Alexander is a 69 y.o. female who presents today for management of    Chief Complaint   Patient presents with   ??? Insomnia       Allergic Rhinitis  Patient presents for evaluation of allergic symptoms.  Symptoms include nasal congestion, rhinorrhea and are not present in a seasonal pattern. It is associated postnasal drip. She has ordered loratadine from her mail-order pharmacy.    COPD Review:  The patient is being seen for follow up of COPD.   Oxygen: She currently is not on home oxygen therapy.  Symptoms: chronic dyspnea: severity = mild: course of sx: stable  cough: severity: mild: sputum : none.   Patient admits to smoke cigarettes.      Problem List  Patient Active Problem List    Diagnosis Date Noted   ??? Rheumatoid arthritis involving multiple sites ([place]) 03/28/2019   ??? 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 Pulmonary nodule 03/28/2019   ??? Chronic diarrhea 03/28/2019   ??? Pulmonary emphysema ([place]) 03/28/2019   ??? Primary insomnia 03/28/2019   ??? Recurrent depression ([place]) 03/28/2019   ??? Prediabetes 07/29/2018       Current Medications  Current Outpatient Medications   Medication Sig   ??? clobetasoL (OLUX) 0.05 % topical foam Apply  to affected area two (2) times a day. use thin film on affected area   ??? clindamycin (CLEOCIN T) 1 % external solution Use twice daily. use thin film on affected area   ??? hydrOXYzine HCL (ATARAX) 25 mg tablet TAKE 1 TO 2 TABLETS BY MOUTH EVERY NIGHT   ??? Breo Ellipta 200-25 mcg/dose inhaler INL 1 PUFF PO QD   ??? DULoxetine (CYMBALTA) 60 mg capsule Take 1  Cap by mouth daily.   ??? DULoxetine (CYMBALTA) 30 mg capsule Take 1 Cap by mouth nightly.   ??? famotidine (PEPCID) 40 mg tablet Take 40 mg by mouth daily.   ??? methotrexate, PF, 25 mg/mL injection every seven (7) days.   ??? albuterol (PROVENTIL HFA, VENTOLIN HFA, PROAIR HFA) 90 mcg/actuation inhaler Take 1-2 puffs every 4-6 hrs prn shortness of breath. Needs ventolin.   ??? ondansetron (ZOFRAN ODT) 4 mg disintegrating tablet Take 1 Tab by mouth every eight (8) hours as needed for Nausea (vomiting).   ??? colesevelam (WELCHOL) 625 mg tablet Take 1,875 mg by mouth two (2) times daily (with meals).   ??? dicyclomine (BENTYL) 10 mg capsule Take 10 mg by mouth three (3) times daily.   ??? hydroxychloroquine (PLAQUENIL) 200 mg tablet Take 200 mg by mouth daily.     No current facility-administered medications for this visit.        Allergies/Drug Reactions  Allergies   Allergen Reactions   ??? Amitriptyline Other (comments)     Dry mouth        Social History  Social History     Tobacco Use   ??? Smoking status: Current Every Day Smoker     Packs/day: 0.50     Years: 30.00  Pack years: 15.00     Types: Cigarettes   ??? Smokeless tobacco: Never Used   Substance Use Topics   ??? Alcohol use: Not Currently     Frequency: Never   ??? Drug use: Never        Review of Systems  Review of Systems   Constitutional: Negative for chills, fever, malaise/fatigue and weight loss.   HENT: Positive for congestion.    Respiratory: Negative for cough, sputum production, shortness of breath and wheezing.    Cardiovascular: Negative for chest pain, palpitations and leg swelling.   Gastrointestinal: Negative.    Musculoskeletal: Negative.    Neurological: Negative for dizziness and headaches.   Psychiatric/Behavioral: Negative for depression. The patient is not nervous/anxious.            Objective:     General: alert, cooperative, no distress   Mental  status: mental status: alert, oriented to person, place, and time, normal mood, behavior, speech, dress,  motor activity, and thought processes   Resp: resp: normal effort and no respiratory distress   Neuro: neuro: no gross deficits   Skin: skin: no discoloration or lesions of concern on visible areas     Due to this being a TeleHealth evaluation, many elements of the physical examination are unable to be assessed.       Assessment & Plan:   1. Chronic rhinitis  - trial of PO antihistamine  - METABOLIC PANEL, COMPREHENSIVE; Future    2. Pulmonary emphysema, unspecified emphysema type ([place])  - stable  - pulmo follow-up: she will establish care with a pulmo in [place] while she is there.  - CBC WITH AUTOMATED DIFF; Future  - METABOLIC PANEL, COMPREHENSIVE; Future    3. Rheumatoid arthritis involving multiple sites, unspecified whether rheumatoid factor present ([place])  - rheum follow-up    4. Recurrent depression ([place])  - stable  - TSH 3RD GENERATION; Future    5. Need for hepatitis C screening test  - HCV AB W/RFLX TO NAA; Future    6. Prediabetes  - URINALYSIS W/ RFLX MICROSCOPIC; Future  - HEMOGLOBIN A1C WITH EAG; Future    7. Screening for lipid disorders  - LIPID PANEL; Future    8. Seborrheic dermatitis of scalp  - refilled meds  - clobetasoL (OLUX) 0.05 % topical foam; Apply  to affected area two (2) times a day. use thin film on affected area  Dispense: 1 Can; Refill: 2  - clindamycin (CLEOCIN T) 1 % external solution; Use twice daily. use thin film on affected area  Dispense: 60 mL; Refill: 2      Follow-up and Dispositions    ?? Return in about 3 months (around 04/19/2020) for ROV.         We discussed the expected course, resolution and complications of the diagnosis(es) in detail.  Medication risks, benefits, costs, interactions, and alternatives were discussed as indicated.  I advised her to contact the office if her condition worsens, changes or fails to improve as anticipated. She expressed understanding with the diagnosis(es) and plan.         Pursuant to the emergency declaration under the [place], 1135 waiver authority and the [employer] Act, this Virtual  Visit was conducted, with patient's consent, to reduce the patient's risk of exposure to COVID-19 and provide continuity of care for an established patient.     Services were provided through a video synchronous discussion virtually  to substitute for in-person clinic visit.    Karen Kays, MD

## 2020-01-20 NOTE — Progress Notes (Signed)
Morgan Alexander is a 69 y.o. female who was seen by synchronous (real-time) audio-video technology using doxy.me on 01/20/2020.      Location of the patient: Home    Location of the provider: [facility]    Consent:  She and/or health care decision maker is aware that that she may receive a bill for this telehealth service, depending on her insurance coverage, and has provided verbal consent to proceed: Yes    Subjective:   Morgan Alexander is a 69 y.o. female who presents today for management of    Chief Complaint   Patient presents with   ??? Insomnia       Allergic Rhinitis  Patient presents for evaluation of allergic symptoms.  Symptoms include nasal congestion, rhinorrhea and are not present in a seasonal pattern. It is associated postnasal drip. She has ordered loratadine from her mail-order pharmacy.    COPD Review:  The patient is being seen for follow up of COPD.   Oxygen: She currently is not on home oxygen therapy.  Symptoms: chronic dyspnea: severity = mild: course of sx: stable  cough: severity: mild: sputum : none.   Patient admits to smoke cigarettes.      Problem List  Patient Active Problem List    Diagnosis Date Noted   ??? Rheumatoid arthritis involving multiple sites ([place]) 03/28/2019   ??? 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 Pulmonary nodule 03/28/2019   ??? Chronic diarrhea 03/28/2019   ??? Pulmonary emphysema ([place]) 03/28/2019   ??? Primary insomnia 03/28/2019   ??? Recurrent depression ([place]) 03/28/2019   ??? Prediabetes 07/29/2018       Current Medications  Current Outpatient Medications   Medication Sig   ??? clobetasoL (OLUX) 0.05 % topical foam Apply  to affected area two (2) times a day. use thin film on affected area   ??? clindamycin (CLEOCIN T) 1 % external solution Use twice daily. use thin film on affected area   ??? hydrOXYzine HCL (ATARAX) 25 mg tablet TAKE 1 TO 2 TABLETS BY MOUTH EVERY NIGHT   ??? Breo Ellipta 200-25 mcg/dose inhaler INL 1 PUFF PO QD    ??? DULoxetine (CYMBALTA) 60 mg capsule Take 1 Cap by mouth daily.   ??? DULoxetine (CYMBALTA) 30 mg capsule Take 1 Cap by mouth nightly.   ??? famotidine (PEPCID) 40 mg tablet Take 40 mg by mouth daily.   ??? methotrexate, PF, 25 mg/mL injection every seven (7) days.   ??? albuterol (PROVENTIL HFA, VENTOLIN HFA, PROAIR HFA) 90 mcg/actuation inhaler Take 1-2 puffs every 4-6 hrs prn shortness of breath. Needs ventolin.   ??? ondansetron (ZOFRAN ODT) 4 mg disintegrating tablet Take 1 Tab by mouth every eight (8) hours as needed for Nausea (vomiting).   ??? colesevelam (WELCHOL) 625 mg tablet Take 1,875 mg by mouth two (2) times daily (with meals).   ??? dicyclomine (BENTYL) 10 mg capsule Take 10 mg by mouth three (3) times daily.   ??? hydroxychloroquine (PLAQUENIL) 200 mg tablet Take 200 mg by mouth daily.     No current facility-administered medications for this visit.        Allergies/Drug Reactions  Allergies   Allergen Reactions   ??? Amitriptyline Other (comments)     Dry mouth        Social History  Social History     Tobacco Use   ??? Smoking status: Current Every Day Smoker     Packs/day: 0.50     Years: 30.00  Pack years: 15.00     Types: Cigarettes   ??? Smokeless tobacco: Never Used   Substance Use Topics   ??? Alcohol use: Not Currently     Frequency: Never   ??? Drug use: Never        Review of Systems  Review of Systems   Constitutional: Negative for chills, fever, malaise/fatigue and weight loss.   HENT: Positive for congestion.    Respiratory: Negative for cough, sputum production, shortness of breath and wheezing.    Cardiovascular: Negative for chest pain, palpitations and leg swelling.   Gastrointestinal: Negative.    Musculoskeletal: Negative.    Neurological: Negative for dizziness and headaches.   Psychiatric/Behavioral: Negative for depression. The patient is not nervous/anxious.            Objective:     General: alert, cooperative, no distress    Mental  status: mental status: alert, oriented to person, place, and time, normal mood, behavior, speech, dress, motor activity, and thought processes   Resp: resp: normal effort and no respiratory distress   Neuro: neuro: no gross deficits   Skin: skin: no discoloration or lesions of concern on visible areas     Due to this being a TeleHealth evaluation, many elements of the physical examination are unable to be assessed.       Assessment & Plan:   1. Chronic rhinitis  - trial of PO antihistamine  - METABOLIC PANEL, COMPREHENSIVE; Future    2. Pulmonary emphysema, unspecified emphysema type ([place])  - stable  - pulmo follow-up: she will establish care with a pulmo in [place] while she is there.  - CBC WITH AUTOMATED DIFF; Future  - METABOLIC PANEL, COMPREHENSIVE; Future    3. Rheumatoid arthritis involving multiple sites, unspecified whether rheumatoid factor present ([place])  - rheum follow-up    4. Recurrent depression ([place])  - stable  - TSH 3RD GENERATION; Future    5. Need for hepatitis C screening test  - HCV AB W/RFLX TO NAA; Future    6. Prediabetes  - URINALYSIS W/ RFLX MICROSCOPIC; Future  - HEMOGLOBIN A1C WITH EAG; Future    7. Screening for lipid disorders  - LIPID PANEL; Future    8. Seborrheic dermatitis of scalp  - refilled meds  - clobetasoL (OLUX) 0.05 % topical foam; Apply  to affected area two (2) times a day. use thin film on affected area  Dispense: 1 Can; Refill: 2  - clindamycin (CLEOCIN T) 1 % external solution; Use twice daily. use thin film on affected area  Dispense: 60 mL; Refill: 2      Follow-up and Dispositions    ?? Return in about 3 months (around 04/19/2020) for ROV.          We discussed the expected course, resolution and complications of the diagnosis(es) in detail.  Medication risks, benefits, costs, interactions, and alternatives were discussed as indicated.  I advised her to contact the office if her condition worsens, changes or fails to improve as anticipated. She expressed understanding with the diagnosis(es) and plan.         Pursuant to the emergency declaration under the [place], 1135 waiver authority and the [employer] Act, this Virtual  Visit was conducted, with patient's consent, to reduce the patient's risk of exposure to COVID-19 and provide continuity of care for an established patient.     Services were provided through a video synchronous discussion virtually  to substitute for in-person clinic visit.    Karen Kays, MD

## 2020-01-30 DIAGNOSIS — K449 Diaphragmatic hernia without obstruction or gangrene: Secondary | ICD-10-CM | POA: Insufficient documentation

## 2020-01-30 DIAGNOSIS — J449 Chronic obstructive pulmonary disease, unspecified: Secondary | ICD-10-CM | POA: Insufficient documentation

## 2020-01-30 DIAGNOSIS — F17210 Nicotine dependence, cigarettes, uncomplicated: Secondary | ICD-10-CM | POA: Insufficient documentation

## 2020-01-30 DIAGNOSIS — R918 Other nonspecific abnormal finding of lung field: Secondary | ICD-10-CM | POA: Insufficient documentation

## 2020-04-23 ENCOUNTER — Encounter

## 2020-04-23 MED ORDER — HYDROXYZINE 25 MG TAB
25 mg | ORAL_TABLET | ORAL | 2 refills | Status: AC
Start: 2020-04-23 — End: ?

## 2020-08-02 ENCOUNTER — Encounter: Payer: Self-pay | Admitting: Internal Medicine

## 2020-08-02 ENCOUNTER — Ambulatory Visit (INDEPENDENT_AMBULATORY_CARE_PROVIDER_SITE_OTHER): Payer: Medicare Other | Admitting: Internal Medicine

## 2020-08-02 ENCOUNTER — Other Ambulatory Visit: Payer: Self-pay

## 2020-08-02 VITALS — BP 138/78 | HR 90 | Temp 98.1°F | Resp 16 | Ht 62.25 in | Wt 100.0 lb

## 2020-08-02 DIAGNOSIS — J431 Panlobular emphysema: Secondary | ICD-10-CM | POA: Diagnosis not present

## 2020-08-02 DIAGNOSIS — M0579 Rheumatoid arthritis with rheumatoid factor of multiple sites without organ or systems involvement: Secondary | ICD-10-CM | POA: Diagnosis not present

## 2020-08-02 DIAGNOSIS — M129 Arthropathy, unspecified: Secondary | ICD-10-CM | POA: Insufficient documentation

## 2020-08-02 DIAGNOSIS — R7303 Prediabetes: Secondary | ICD-10-CM

## 2020-08-02 DIAGNOSIS — Z0000 Encounter for general adult medical examination without abnormal findings: Secondary | ICD-10-CM | POA: Diagnosis not present

## 2020-08-02 DIAGNOSIS — J439 Emphysema, unspecified: Secondary | ICD-10-CM | POA: Insufficient documentation

## 2020-08-02 DIAGNOSIS — M720 Palmar fascial fibromatosis [Dupuytren]: Secondary | ICD-10-CM

## 2020-08-02 DIAGNOSIS — E785 Hyperlipidemia, unspecified: Secondary | ICD-10-CM | POA: Insufficient documentation

## 2020-08-02 DIAGNOSIS — Z720 Tobacco use: Secondary | ICD-10-CM | POA: Insufficient documentation

## 2020-08-02 DIAGNOSIS — Z1231 Encounter for screening mammogram for malignant neoplasm of breast: Secondary | ICD-10-CM

## 2020-08-02 DIAGNOSIS — Z23 Encounter for immunization: Secondary | ICD-10-CM | POA: Diagnosis not present

## 2020-08-02 MED ORDER — METHOTREXATE 25 MG/ML ~~LOC~~ SOSY: 25.0000 mg | PREFILLED_SYRINGE | SUBCUTANEOUS | 1 refills | Status: DC

## 2020-08-02 NOTE — Progress Notes (Signed)
Subjective:  Patient ID: Devra Stare, female    DOB: [birth year]  Age: 69 y.o. MRN: [id number]  CC: Annual Exam  This visit occurred during the SARS-CoV-2 public health emergency.  Safety protocols were in place, including screening questions prior to the visit, additional usage of staff PPE, and extensive cleaning of exam room while observing appropriate contact time as indicated for disinfecting solutions.    HPI Makalia Bare presents for a CPX.  She is new to me and has a complex medical history including what sounds like GERD, peptic ulcer disease, celiac disease, gastroparesis, rheumatoid arthritis, GAD, and COPD/ tobacco abuse. She tells me she seen a gastroenterologist within the last year or 2 for recurrent episodes of nausea and vomiting. She is doing better with a combination of WelChol, Pepcid, and Zofran. She has chronic unchanged shortness of breath but denies chest pain, diaphoresis, dizziness, or lightheadedness. She has gotten good symptom relief from her COPD with a LABA/ICS combination inhaler.  History Kimoni has a past medical history of Celiac disease, GERD (gastroesophageal reflux disease), PUD (peptic ulcer disease), Pulmonary emphysema ([place]), and Recurrent depression ([place]).   She has a past surgical history that includes Tonsillectomy (1977).   Her family history includes CAD in her mother; COPD in her mother; CVA in her mother; Diabetes in her mother; Rheum arthritis in her sister; Skin cancer in her brother.She reports that she has been smoking cigarettes. She has a 15.00 pack-year smoking history. She has never used smokeless tobacco. She reports previous alcohol use. She reports that she does not use drugs.  Outpatient Medications Prior to Visit  Medication Sig Dispense Refill  . albuterol (VENTOLIN HFA) 108 (90 Base) MCG/ACT inhaler Take 1-2 puffs every 4-6 hrs prn shortness of breath. Needs ventolin.    . clindamycin (CLEOCIN T) 1 % external solution Use  twice daily. use thin film on affected area    . clobetasol (OLUX) 0.05 % topical foam     . co-enzyme Q-10 30 MG capsule Take by mouth.    . colesevelam (WELCHOL) 625 MG tablet Take 1,250-2,300 mg by mouth in the morning and at bedtime.    Marland Kitchen denosumab (PROLIA) 60 MG/ML SOSY injection Inject into the skin.    . DULoxetine (CYMBALTA) 30 MG capsule Take 30 mg by mouth every evening.    . DULoxetine (CYMBALTA) 60 MG capsule Take 60 mg by mouth in the morning.    . famotidine (PEPCID) 40 MG tablet Take 40 mg by mouth 2 (two) times daily.    . fluticasone furoate-vilanterol (BREO ELLIPTA) 100-25 MCG/INH AEPB Inhale into the lungs.    . folic acid (FOLVITE) 1 MG tablet Take by mouth.    . hydroxychloroquine (PLAQUENIL) 200 MG tablet Take 200 mg by mouth 2 (two) times daily.    . TUBERCULIN SYR 1CC/25GX5/8" 25G X 5/8" 1 ML MISC 1 Each by Misc.(Non-Drug; Combo Route) route Every 7 Days.    . TURMERIC PO 500 mg by Misc.(Non-Drug; Combo Route) route.    . Methotrexate 25 MG/ML SOSY Inject 25 mg as directed once a week.    . ondansetron (ZOFRAN) 4 MG tablet Take by mouth. (Patient not taking: Reported on 08/02/2020)     No facility-administered medications prior to visit.    ROS Review of Systems  Constitutional: Negative.  Negative for appetite change, chills, diaphoresis, fatigue and fever.  HENT: Negative.  Negative for trouble swallowing.   Eyes: Negative.   Respiratory: Positive for shortness of breath. Negative  for cough, chest tightness and wheezing.   Cardiovascular: Negative for chest pain, palpitations and leg swelling.  Gastrointestinal: Positive for nausea. Negative for abdominal pain, blood in stool, constipation, diarrhea and vomiting.  Endocrine: Negative.   Genitourinary: Negative for difficulty urinating, dysuria, hematuria and urgency.  Musculoskeletal: Positive for arthralgias and myalgias.       She complains of arthralgias related to rheumatoid arthritis and requests that I  write a prescription for a weekly injection of methotrexate.  Skin: Negative.   Neurological: Negative.  Negative for dizziness, weakness, light-headedness, numbness and headaches.  Hematological: Negative for adenopathy. Does not bruise/bleed easily.  Psychiatric/Behavioral: Positive for dysphoric mood. The patient is nervous/anxious.     Objective:  BP 138/78 (BP Location: Left Arm, Patient Position: Sitting, Cuff Size: Normal)   Pulse 90   Temp 98.1 F (36.7 C) (Oral)   Resp 16   Ht 5' 2.25" (1.581 m)   Wt 100 lb (45.4 kg)   SpO2 95%   BMI 18.14 kg/m   Physical Exam Vitals reviewed.  Constitutional:      General: She is not in acute distress.    Appearance: Normal appearance. She is not toxic-appearing or diaphoretic.  HENT:     Nose: Nose normal.     Mouth/Throat:     Mouth: Mucous membranes are moist.  Eyes:     General: No scleral icterus.    Conjunctiva/sclera: Conjunctivae normal.  Cardiovascular:     Rate and Rhythm: Normal rate and regular rhythm.     Heart sounds: No murmur heard.   Pulmonary:     Effort: Pulmonary effort is normal. No accessory muscle usage.     Breath sounds: Examination of the right-upper field reveals decreased breath sounds. Examination of the left-upper field reveals decreased breath sounds. Examination of the right-middle field reveals decreased breath sounds. Examination of the left-middle field reveals decreased breath sounds. Examination of the right-lower field reveals decreased breath sounds. Examination of the left-lower field reveals decreased breath sounds. Decreased breath sounds present. No wheezing, rhonchi or rales.  Abdominal:     Palpations: There is no mass.     Tenderness: There is no abdominal tenderness. There is no guarding.  Musculoskeletal:        General: Normal range of motion.     Cervical back: Neck supple.     Right lower leg: No edema.     Left lower leg: No edema.     Comments: Right hand shows ulnar  deviations in the MCP joints and Dupuytren's contractions on the palmar aspect.  Lymphadenopathy:     Cervical: No cervical adenopathy.  Skin:    General: Skin is warm and dry.     Coloration: Skin is not pale.  Neurological:     General: No focal deficit present.     Mental Status: She is alert and oriented to person, place, and time. Mental status is at baseline.  Psychiatric:        Mood and Affect: Mood normal.        Behavior: Behavior normal.     Lab Results  Component Value Date   WBC 11.7 (H) 08/02/2020   HGB 13.7 08/02/2020   HCT 41.6 08/02/2020   PLT 301 08/02/2020   GLUCOSE 80 08/02/2020   CHOL 156 08/02/2020   TRIG 142 08/02/2020   HDL 65 08/02/2020   LDLCALC 68 08/02/2020   ALT 12 08/02/2020   AST 19 08/02/2020   NA 139 08/02/2020   K  4.5 08/02/2020   CL 106 08/02/2020   CREATININE 0.63 08/02/2020   BUN 16 08/02/2020   CO2 26 08/02/2020   TSH 2.73 08/02/2020   HGBA1C 5.5 08/02/2020     IMPRESSION:  1. 4 mm nodule seen in right lower lobe. No follow-up needed if  patient is low-risk. Non-contrast chest CT can be considered in 12  months if patient is high-risk. This recommendation follows the  consensus statement: Guidelines for Management of Incidental  Pulmonary Nodules Detected on CT Images: From the Fleischner Society  2017; Radiology 2017; 284:228-243. No prior images are available for  comparison at this time; if these become available at a later date,  comparison can then be made and an addendum to this report can be  dictated.  2. Left nephrolithiasis.  3. Emphysema and aortic atherosclerosis.   Aortic Atherosclerosis (ICD10-I70.0) and Emphysema (ICD10-J43.9).    Electronically Signed  By: Marijo Conception M.D.  On: 02/09/2020 08:49 Procedure Note     Assessment & Plan:   Suzanne was seen today for annual exam.  Diagnoses and all orders for this visit:  Routine general medical examination at a health care facility- Exam  completed, labs reviewed, vaccines reviewed and updated, she tells me that screening for cervical and colon cancer are up-to-date, I ordered a mammogram to screen for breast cancer, patient education material was given.  Visit for screening mammogram -     MM DIGITAL SCREENING BILATERAL; Future  Prediabetes- Her blood sugars are normal now. -     Hemoglobin A1c; Future -     BASIC METABOLIC PANEL WITH GFR; Future -     BASIC METABOLIC PANEL WITH GFR -     Hemoglobin A1c  Rheumatoid arthritis involving multiple sites with positive rheumatoid factor (HCC) -     CBC with Differential/Platelet; Future -     CK; Future -     Methotrexate 25 MG/ML SOSY; Inject 25 mg as directed once a week. -     BASIC METABOLIC PANEL WITH GFR; Future -     Ambulatory referral to Rheumatology -     CK -     BASIC METABOLIC PANEL WITH GFR -     CBC with Differential/Platelet  Hyperlipidemia LDL goal <130- She has a mildly elevated ASCVD risk score so I have asked her to take low-dose pitavastatin to reduce her risk of heart attack and stroke. -     Lipid panel; Future -     TSH; Future -     Hepatic function panel; Future -     Hepatic function panel -     TSH -     Lipid panel -     Pitavastatin Calcium (LIVALO) 1 MG TABS; Take 1 tablet (1 mg total) by mouth daily.   Tobacco abuse  Need for pneumococcal vaccination -     Pneumococcal polysaccharide vaccine 23-valent greater than or equal to 2yo subcutaneous/IM  Panlobular emphysema ([place])- I asked her to quit smoking and to continue using BREO.  Dupuytren's contracture of right hand- She wants to consider surgical options to treat this. -     Ambulatory referral to Orthopedic Surgery   I am having [name] start on Livalo. I am also having her maintain her TURMERIC PO, albuterol, Breo Ellipta, DULoxetine, DULoxetine, ondansetron, colesevelam, denosumab, hydroxychloroquine, TUBERCULIN SYR 3ZH/29JM4/2", folic acid, co-enzyme A-83, clobetasol,  clindamycin, famotidine, and Methotrexate.  Meds ordered this encounter  Medications  . Methotrexate 25 MG/ML SOSY  Sig: Inject 25 mg as directed once a week.    Dispense:  4 mL    Refill:  1  . Pitavastatin Calcium (LIVALO) 1 MG TABS    Sig: Take 1 tablet (1 mg total) by mouth daily.    Dispense:  90 tablet    Refill:  1     Follow-up: Return in about 3 months (around 11/02/2020).  Scarlette Calico, MD

## 2020-08-02 NOTE — Patient Instructions (Signed)
Health Maintenance, Female Adopting a healthy lifestyle and getting preventive care are important in promoting health and wellness. Ask your health care provider about:  The right schedule for you to have regular tests and exams.  Things you can do on your own to prevent diseases and keep yourself healthy. What should I know about diet, weight, and exercise? Eat a healthy diet   Eat a diet that includes plenty of vegetables, fruits, low-fat dairy products, and lean protein.  Do not eat a lot of foods that are high in solid fats, added sugars, or sodium. Maintain a healthy weight Body mass index (BMI) is used to identify weight problems. It estimates body fat based on height and weight. Your health care provider can help determine your BMI and help you achieve or maintain a healthy weight. Get regular exercise Get regular exercise. This is one of the most important things you can do for your health. Most adults should:  Exercise for at least 150 minutes each week. The exercise should increase your heart rate and make you sweat (moderate-intensity exercise).  Do strengthening exercises at least twice a week. This is in addition to the moderate-intensity exercise.  Spend less time sitting. Even light physical activity can be beneficial. Watch cholesterol and blood lipids Have your blood tested for lipids and cholesterol at 68 years of age, then have this test every 5 years. Have your cholesterol levels checked more often if:  Your lipid or cholesterol levels are high.  You are older than 68 years of age.  You are at high risk for heart disease. What should I know about cancer screening? Depending on your health history and family history, you may need to have cancer screening at various ages. This may include screening for:  Breast cancer.  Cervical cancer.  Colorectal cancer.  Skin cancer.  Lung cancer. What should I know about heart disease, diabetes, and high blood  pressure? Blood pressure and heart disease  High blood pressure causes heart disease and increases the risk of stroke. This is more likely to develop in people who have high blood pressure readings, are of African descent, or are overweight.  Have your blood pressure checked: ? Every 3-5 years if you are 18-39 years of age. ? Every year if you are 40 years old or older. Diabetes Have regular diabetes screenings. This checks your fasting blood sugar level. Have the screening done:  Once every three years after age 40 if you are at a normal weight and have a low risk for diabetes.  More often and at a younger age if you are overweight or have a high risk for diabetes. What should I know about preventing infection? Hepatitis B If you have a higher risk for hepatitis B, you should be screened for this virus. Talk with your health care provider to find out if you are at risk for hepatitis B infection. Hepatitis C Testing is recommended for:  Everyone born from 1945 through 1965.  Anyone with known risk factors for hepatitis C. Sexually transmitted infections (STIs)  Get screened for STIs, including gonorrhea and chlamydia, if: ? You are sexually active and are younger than 68 years of age. ? You are older than 68 years of age and your health care provider tells you that you are at risk for this type of infection. ? Your sexual activity has changed since you were last screened, and you are at increased risk for chlamydia or gonorrhea. Ask your health care provider if   you are at risk.  Ask your health care provider about whether you are at high risk for HIV. Your health care provider may recommend a prescription medicine to help prevent HIV infection. If you choose to take medicine to prevent HIV, you should first get tested for HIV. You should then be tested every 3 months for as long as you are taking the medicine. Pregnancy  If you are about to stop having your period (premenopausal) and  you may become pregnant, seek counseling before you get pregnant.  Take 400 to 800 micrograms (mcg) of folic acid every day if you become pregnant.  Ask for birth control (contraception) if you want to prevent pregnancy. Osteoporosis and menopause Osteoporosis is a disease in which the bones lose minerals and strength with aging. This can result in bone fractures. If you are 65 years old or older, or if you are at risk for osteoporosis and fractures, ask your health care provider if you should:  Be screened for bone loss.  Take a calcium or vitamin D supplement to lower your risk of fractures.  Be given hormone replacement therapy (HRT) to treat symptoms of menopause. Follow these instructions at home: Lifestyle  Do not use any products that contain nicotine or tobacco, such as cigarettes, e-cigarettes, and chewing tobacco. If you need help quitting, ask your health care provider.  Do not use street drugs.  Do not share needles.  Ask your health care provider for help if you need support or information about quitting drugs. Alcohol use  Do not drink alcohol if: ? Your health care provider tells you not to drink. ? You are pregnant, may be pregnant, or are planning to become pregnant.  If you drink alcohol: ? Limit how much you use to 0-1 drink a day. ? Limit intake if you are breastfeeding.  Be aware of how much alcohol is in your drink. In the [place] one drink equals one 12 oz bottle of beer (355 mL), one 5 oz glass of wine (148 mL), or one 1 oz glass of hard liquor (44 mL). General instructions  Schedule regular health, dental, and eye exams.  Stay current with your vaccines.  Tell your health care provider if: ? You often feel depressed. ? You have ever been abused or do not feel safe at home. Summary  Adopting a healthy lifestyle and getting preventive care are important in promoting health and wellness.  Follow your health care provider's instructions about healthy  diet, exercising, and getting tested or screened for diseases.  Follow your health care provider's instructions on monitoring your cholesterol and blood pressure. This information is not intended to replace advice given to you by your health care provider. Make sure you discuss any questions you have with your health care provider. Document Revised: 12/08/2018 Document Reviewed: 12/08/2018 Elsevier Patient Education  2020 [employer] Inc.  

## 2020-08-03 LAB — CBC WITH DIFFERENTIAL/PLATELET
Absolute Monocytes: 1053 cells/uL — ABNORMAL HIGH (ref 200–950)
Basophils Absolute: 70 cells/uL (ref 0–200)
Basophils Relative: 0.6 %
Eosinophils Absolute: 246 cells/uL (ref 15–500)
Eosinophils Relative: 2.1 %
HCT: 41.6 % (ref 35.0–45.0)
Hemoglobin: 13.7 g/dL (ref 11.7–15.5)
Lymphs Abs: 2668 cells/uL (ref 850–3900)
MCH: 30.6 pg (ref 27.0–33.0)
MCHC: 32.9 g/dL (ref 32.0–36.0)
MCV: 93.1 fL (ref 80.0–100.0)
MPV: 10 fL (ref 7.5–12.5)
Monocytes Relative: 9 %
Neutro Abs: 7664 cells/uL (ref 1500–7800)
Neutrophils Relative %: 65.5 %
Platelets: 301 10*3/uL (ref 140–400)
RBC: 4.47 10*6/uL (ref 3.80–5.10)
RDW: 12.7 % (ref 11.0–15.0)
Total Lymphocyte: 22.8 %
WBC: 11.7 10*3/uL — ABNORMAL HIGH (ref 3.8–10.8)

## 2020-08-03 LAB — BASIC METABOLIC PANEL WITH GFR
BUN: 16 mg/dL (ref 7–25)
CO2: 26 mmol/L (ref 20–32)
Calcium: 9.5 mg/dL (ref 8.6–10.4)
Chloride: 106 mmol/L (ref 98–110)
Creat: 0.63 mg/dL (ref 0.50–0.99)
GFR, Est African American: 108 mL/min/{1.73_m2} (ref >=60)
GFR, Est Non African American: 93 mL/min/{1.73_m2} (ref >=60)
Glucose, Bld: 80 mg/dL (ref 65–99)
Potassium: 4.5 mmol/L (ref 3.5–5.3)
Sodium: 139 mmol/L (ref 135–146)

## 2020-08-03 LAB — HEPATIC FUNCTION PANEL
AG Ratio: 1.1 (calc) (ref 1.0–2.5)
ALT: 12 U/L (ref 6–29)
AST: 19 U/L (ref 10–35)
Albumin: 3.7 g/dL (ref 3.6–5.1)
Alkaline phosphatase (APISO): 103 U/L (ref 37–153)
Bilirubin, Direct: 0.1 mg/dL (ref 0.0–0.2)
Globulin: 3.3 g/dL (calc) (ref 1.9–3.7)
Indirect Bilirubin: 0.3 mg/dL (calc) (ref 0.2–1.2)
Total Bilirubin: 0.4 mg/dL (ref 0.2–1.2)
Total Protein: 7 g/dL (ref 6.1–8.1)

## 2020-08-03 LAB — LIPID PANEL
Cholesterol: 156 mg/dL (ref <200)
HDL: 65 mg/dL (ref >=50)
LDL Cholesterol (Calc): 68 mg/dL (calc)
Non-HDL Cholesterol (Calc): 91 mg/dL (calc) (ref <130)
Total CHOL/HDL Ratio: 2.4 (calc) (ref <5.0)
Triglycerides: 142 mg/dL (ref <150)

## 2020-08-03 LAB — TSH: TSH: 2.73 mIU/L (ref 0.40–4.50)

## 2020-08-03 LAB — HEMOGLOBIN A1C
Hgb A1c MFr Bld: 5.5 % of total Hgb (ref <5.7)
Mean Plasma Glucose: 111 (calc)
eAG (mmol/L): 6.2 (calc)

## 2020-08-03 LAB — CK: Total CK: 67 U/L (ref 29–143)

## 2020-08-04 ENCOUNTER — Encounter: Payer: Self-pay | Admitting: Internal Medicine

## 2020-08-04 MED ORDER — LIVALO 1 MG PO TABS: 1.0000 | ORAL_TABLET | Freq: Every day | ORAL | 1 refills | Status: DC

## 2020-08-06 ENCOUNTER — Encounter: Payer: Self-pay | Admitting: Internal Medicine

## 2020-08-06 NOTE — Telephone Encounter (Signed)
Spoke to pt and she will be calling [facility] back

## 2020-08-08 ENCOUNTER — Telehealth: Payer: Self-pay

## 2020-08-08 NOTE — Telephone Encounter (Signed)
Key: [id number] I spoke to the pharmacy Wednesday and they stated that they do not have the Methotrexate in prefilled syringes but they have the name brand drug Redi Trex in the prefilled syringes and that is why the insurance company was denying the medication. I have completed the PA for the name brand Redi Trex. They did mention that the methotrexate generic does not come in th prefilled syringes.   If the PA is denied then we will either need to have pharmacy order the vials of methotrexate or find another pharmacy to dispense.

## 2020-08-08 NOTE — Telephone Encounter (Signed)
Key: [id number]  The directions are: Inject 25mg /mL weekly.   PA was completed for 50mg /65mL and requested 60mL per 30 days

## 2020-08-08 NOTE — Telephone Encounter (Signed)
Determination:   This medication or product is on your plan's list of covered drugs. Prior authorization is not required at this time. If your pharmacy has questions regarding the processing of your prescription, please have them call the [facility] pharmacy help desk at ([phone]. **Please note: Formulary lowering, tiering exception, cost reduction and/or pre-benefit determination review (including prospective Medicare hospice reviews) requests cannot be requested using this method of submission. Please contact us at [phone] instead.

## 2020-08-10 ENCOUNTER — Encounter: Payer: Self-pay | Admitting: Internal Medicine

## 2020-08-10 DIAGNOSIS — M720 Palmar fascial fibromatosis [Dupuytren]: Secondary | ICD-10-CM

## 2020-08-10 DIAGNOSIS — M0579 Rheumatoid arthritis with rheumatoid factor of multiple sites without organ or systems involvement: Secondary | ICD-10-CM

## 2020-08-13 ENCOUNTER — Ambulatory Visit: Payer: Medicare Other | Admitting: Family Medicine

## 2020-08-16 ENCOUNTER — Telehealth: Payer: Self-pay

## 2020-08-16 NOTE — Telephone Encounter (Signed)
Pt contacted and she stated that pharmacy filled the vial of methotrexate. Pt stated that the prefilled syringes are much easier to use due the arthritic changes that she has in both hands and fingers. Pt finds it difficult to hold a vial and a syringe. Line dropped.   Tried to call pt back. No answer. LVM that I will call pt back in a moment.

## 2020-08-16 NOTE — Telephone Encounter (Signed)
Per Gareth Eagle, pt rx benefits were verified and Prolia is covered with a $250.00 copay.   LVM for pt to call back.    Will follow up with pt on Friday.

## 2020-08-17 NOTE — Telephone Encounter (Signed)
Pt contacted and informed of copay.   Pt would like to apply for some additional assistance to cover the cost of her copay.   Can you assist her with that?

## 2020-08-20 ENCOUNTER — Encounter: Payer: Self-pay | Admitting: Family Medicine

## 2020-08-20 ENCOUNTER — Other Ambulatory Visit: Payer: Self-pay

## 2020-08-20 ENCOUNTER — Ambulatory Visit (INDEPENDENT_AMBULATORY_CARE_PROVIDER_SITE_OTHER): Payer: Medicare Other | Admitting: Family Medicine

## 2020-08-20 DIAGNOSIS — M0579 Rheumatoid arthritis with rheumatoid factor of multiple sites without organ or systems involvement: Secondary | ICD-10-CM

## 2020-08-20 DIAGNOSIS — M79641 Pain in right hand: Secondary | ICD-10-CM | POA: Diagnosis not present

## 2020-08-20 MED ORDER — CELECOXIB 50 MG PO CAPS: 50.0000 mg | ORAL_CAPSULE | Freq: Two times a day (BID) | ORAL | 3 refills | Status: DC | PRN

## 2020-08-20 NOTE — Progress Notes (Signed)
I saw and examined the patient with Dr. Elouise Munroe and agree with assessment and plan as outlined.    She was diagnosed with rheumatoid arthritis in approximately 2006.  She was living in [place] at the time.  She has moved over the years and has been with various rheumatologists.  She has tried different Biologics and methotrexate.  She is now here in [place] to help her daughter.  She is supposed to see rheumatology but not until January.  She is hoping to get in sooner somewhere and would like help with that.  Both of her hands have been hurting and the subluxations have worsened over the past year.  The right hand bothers her the most.  There's also concern for dupuytren's contracture.  Hand exam reveals subluxation of the MCP joints of her right hand with bilateral mild synovitis.  Possible duyuytren's of 4th and 5th on the right.  Will refer to hand surgery; refer to hand therapy; refer to rheumatology.  Low-dose celebrex as needed.

## 2020-08-20 NOTE — Progress Notes (Signed)
Office Visit Note   Patient: Abigail Lamb           Date of Birth: 20-May-1951           MRN: [id number] Visit Date: 08/20/2020 Requested by: Janith Lima, MD [id number] [place],  [place] [address] PCP: Janith Lima, MD  Subjective: Chief Complaint  Patient presents with  . Right Hand - Pain    HPI:  69 year old female presenting to clinic with concerns of bilateral (R>L) hand pain, which is significantly worsened over the past year.  Patient endorses a longstanding history of rheumatoid arthritis, which she states was diagnosed around 2006.  She has been on methotrexate for several years, as well as multiple Biologics.  She recently moved to [facility], however, and has yet to establish care with a local rheumatologist.  A referral was placed for her by her new PCM, however she is unable to have an intake appointment until January.  Over the past few months, the pain in her hands has rapidly progressed.  She now feels as though she is unable to fully extend or flex either of her hands, and has an aching pain throughout the day.  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  She wants to know what else she can do to help slow down the progression.                ROS:   All other systems were reviewed and are negative.  Objective: Vital Signs: There were no vitals taken for this visit.  Physical Exam:  General:  Alert and oriented, in no acute distress. Pulm:  Breathing unlabored. Psy:  Normal mood, congruent affect. Skin: Bilateral hands with no rashes, bruising, or other skin lesions appreciated. Bilateral hands with ulnar deviation of digits 2 through 5, with significant bony hypertrophy of metacarpophalangeal joints.  Marked crepitus and phalange subluxation appreciated with finger flexion.  Range of  motion limited throughout all fingers, with inability to extend either fifth distal interphalangeal joints.  Flexor tendons without marked nodularity.  Imaging: No results found.  Assessment & Plan: 69 year old female presenting to clinic today with bilateral hand deformity and pain, due to longstanding history of rheumatoid arthritis.  Patient symptoms have rapidly progressed over the past year.  At this time, patient has no follow-up with rheumatology for the next several months.  -We will attempt to schedule more expeditious rheumatology appointment, as treatment of underlying rheumatologic disease is most likely to help her bilateral hand symptoms.  -We will place referral to hand surgeon, due to marked bilateral hand deformity.  -While awaiting referrals as above, will schedule with occupational therapy for hand exercises and bracing to improve her symptoms.  -Celebrex for pain control.  -Patient had no further questions or concerns at completion of today's visit.      Procedures: No procedures performed  No notes on file     PMFS History: Patient Active Problem List   Diagnosis Date Noted  . Routine general medical examination at a health care facility 08/02/2020  . Visit for screening mammogram 08/02/2020  . Rheumatoid arthritis involving multiple sites with positive rheumatoid factor ([place]) 08/02/2020  . Hyperlipidemia LDL goal <130 08/02/2020  . Tobacco abuse 08/02/2020  . Need for pneumococcal vaccination 08/02/2020  . Dupuytren's contracture of  right hand 08/02/2020  . Pulmonary emphysema ([place])   . Stage 2 moderate COPD by GOLD classification ([place]) 01/30/2020  . Cigarette smoker 01/30/2020  . Lung nodules 01/30/2020  . 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 Primary insomnia 03/28/2019  . Recurrent depression ([place]) 03/28/2019  . Prediabetes 07/29/2018   Past Medical History:  Diagnosis Date  . Celiac disease   . GERD (gastroesophageal reflux disease)   . PUD (peptic  ulcer disease)   . Pulmonary emphysema ([place])   . Recurrent depression ([place])     Family History  Problem Relation Age of Onset  . CVA Mother   . CAD Mother   . Diabetes Mother   . COPD Mother   . Rheum arthritis Sister   . Skin cancer Brother     Past Surgical History:  Procedure Laterality Date  . TONSILLECTOMY  1977   Social History   Occupational History  . Not on file  Tobacco Use  . Smoking status: Current Every Day Smoker    Packs/day: 0.50    Years: 30.00    Pack years: 15.00    Types: Cigarettes  . Smokeless tobacco: Never Used  Vaping Use  . Vaping Use: Never used  Substance and Sexual Activity  . Alcohol use: Not Currently  . Drug use: Never  . Sexual activity: Not Currently

## 2020-08-22 ENCOUNTER — Ambulatory Visit
Admission: RE | Admit: 2020-08-22 | Discharge: 2020-08-22 | Disposition: A | Discharge status: Home / Self Care | Payer: Medicare Other | Source: Ambulatory Visit | Attending: Internal Medicine | Admitting: Internal Medicine

## 2020-08-22 ENCOUNTER — Other Ambulatory Visit: Payer: Self-pay

## 2020-08-22 DIAGNOSIS — Z1231 Encounter for screening mammogram for malignant neoplasm of breast: Secondary | ICD-10-CM

## 2020-08-22 NOTE — Telephone Encounter (Signed)
Left message for patient with the following information:  There is copay assistance through the [facility] [phone] or www.healthwellfoundation.org). If approved, they would supply her with a card to use for the copay. To make this an easier transaction, we would need to send Prolia to her pharmacy. She would then give the [place] information to the pharmacy for the cost. She can either pick it up (if a local pharmacy) or have it mailed (if a speciality pharmacy) and we will administer it here.

## 2020-08-27 ENCOUNTER — Other Ambulatory Visit: Payer: Self-pay | Admitting: Internal Medicine

## 2020-08-27 ENCOUNTER — Encounter: Payer: Self-pay | Admitting: Internal Medicine

## 2020-08-27 DIAGNOSIS — F339 Major depressive disorder, recurrent, unspecified: Secondary | ICD-10-CM

## 2020-08-27 DIAGNOSIS — K3184 Gastroparesis: Secondary | ICD-10-CM

## 2020-08-27 DIAGNOSIS — M0579 Rheumatoid arthritis with rheumatoid factor of multiple sites without organ or systems involvement: Secondary | ICD-10-CM

## 2020-08-27 DIAGNOSIS — K582 Mixed irritable bowel syndrome: Secondary | ICD-10-CM

## 2020-08-27 DIAGNOSIS — F3341 Major depressive disorder, recurrent, in partial remission: Secondary | ICD-10-CM

## 2020-08-27 MED ORDER — ONDANSETRON HCL 4 MG PO TABS: 4.0000 mg | ORAL_TABLET | Freq: Three times a day (TID) | ORAL | 2 refills | Status: DC | PRN

## 2020-08-27 MED ORDER — PREDNISONE 5 MG PO TABS: 5.0000 mg | ORAL_TABLET | Freq: Every day | ORAL | 0 refills | Status: AC

## 2020-08-27 MED ORDER — HYDROXYCHLOROQUINE SULFATE 200 MG PO TABS: 200.0000 mg | ORAL_TABLET | Freq: Two times a day (BID) | ORAL | 0 refills | Status: DC

## 2020-08-27 MED ORDER — DULOXETINE HCL 60 MG PO CPEP: 60.0000 mg | ORAL_CAPSULE | Freq: Every day | ORAL | 1 refills | Status: DC

## 2020-08-27 MED ORDER — DICYCLOMINE HCL 10 MG PO CAPS: 10.0000 mg | ORAL_CAPSULE | Freq: Three times a day (TID) | ORAL | 0 refills | Status: DC

## 2020-08-27 MED ORDER — DULOXETINE HCL 30 MG PO CPEP: 30.0000 mg | ORAL_CAPSULE | Freq: Every evening | ORAL | 1 refills | Status: DC

## 2020-08-29 ENCOUNTER — Other Ambulatory Visit: Payer: Self-pay | Admitting: Internal Medicine

## 2020-08-29 DIAGNOSIS — M0579 Rheumatoid arthritis with rheumatoid factor of multiple sites without organ or systems involvement: Secondary | ICD-10-CM

## 2020-09-05 LAB — HM MAMMOGRAPHY

## 2020-09-12 ENCOUNTER — Other Ambulatory Visit: Payer: Self-pay | Admitting: Internal Medicine

## 2020-09-12 ENCOUNTER — Encounter: Payer: Self-pay | Admitting: Internal Medicine

## 2020-09-12 DIAGNOSIS — M0579 Rheumatoid arthritis with rheumatoid factor of multiple sites without organ or systems involvement: Secondary | ICD-10-CM

## 2020-09-12 DIAGNOSIS — E785 Hyperlipidemia, unspecified: Secondary | ICD-10-CM

## 2020-09-12 MED ORDER — COLESEVELAM HCL 625 MG PO TABS: 1875.0000 mg | ORAL_TABLET | Freq: Two times a day (BID) | ORAL | 1 refills | Status: DC

## 2020-09-12 MED ORDER — METHOTREXATE SODIUM CHEMO INJECTION 50 MG/2ML: 25.0000 mg | INTRAMUSCULAR | 0 refills | Status: DC

## 2020-09-17 ENCOUNTER — Telehealth: Payer: Self-pay

## 2020-09-17 ENCOUNTER — Ambulatory Visit: Payer: Self-pay

## 2020-09-17 ENCOUNTER — Other Ambulatory Visit: Payer: Self-pay

## 2020-09-17 ENCOUNTER — Ambulatory Visit: Payer: Medicare Other | Admitting: Rheumatology

## 2020-09-17 ENCOUNTER — Encounter: Payer: Self-pay | Admitting: Rheumatology

## 2020-09-17 VITALS — BP 138/75 | HR 98 | Resp 14 | Ht 62.0 in | Wt 102.0 lb

## 2020-09-17 DIAGNOSIS — G8929 Other chronic pain: Secondary | ICD-10-CM | POA: Diagnosis not present

## 2020-09-17 DIAGNOSIS — Z79899 Other long term (current) drug therapy: Secondary | ICD-10-CM | POA: Diagnosis not present

## 2020-09-17 DIAGNOSIS — M79671 Pain in right foot: Secondary | ICD-10-CM | POA: Diagnosis not present

## 2020-09-17 DIAGNOSIS — M0579 Rheumatoid arthritis with rheumatoid factor of multiple sites without organ or systems involvement: Secondary | ICD-10-CM

## 2020-09-17 DIAGNOSIS — M0630 Rheumatoid nodule, unspecified site: Secondary | ICD-10-CM

## 2020-09-17 DIAGNOSIS — M25562 Pain in left knee: Secondary | ICD-10-CM | POA: Diagnosis not present

## 2020-09-17 DIAGNOSIS — Z7189 Other specified counseling: Secondary | ICD-10-CM

## 2020-09-17 DIAGNOSIS — M810 Age-related osteoporosis without current pathological fracture: Secondary | ICD-10-CM

## 2020-09-17 DIAGNOSIS — M25561 Pain in right knee: Secondary | ICD-10-CM

## 2020-09-17 DIAGNOSIS — M79672 Pain in left foot: Secondary | ICD-10-CM | POA: Diagnosis not present

## 2020-09-17 DIAGNOSIS — Z720 Tobacco use: Secondary | ICD-10-CM

## 2020-09-17 DIAGNOSIS — R7303 Prediabetes: Secondary | ICD-10-CM

## 2020-09-17 DIAGNOSIS — M79641 Pain in right hand: Secondary | ICD-10-CM | POA: Diagnosis not present

## 2020-09-17 DIAGNOSIS — J449 Chronic obstructive pulmonary disease, unspecified: Secondary | ICD-10-CM

## 2020-09-17 DIAGNOSIS — F339 Major depressive disorder, recurrent, unspecified: Secondary | ICD-10-CM

## 2020-09-17 DIAGNOSIS — R5383 Other fatigue: Secondary | ICD-10-CM

## 2020-09-17 DIAGNOSIS — F5101 Primary insomnia: Secondary | ICD-10-CM

## 2020-09-17 DIAGNOSIS — E785 Hyperlipidemia, unspecified: Secondary | ICD-10-CM

## 2020-09-17 DIAGNOSIS — M79642 Pain in left hand: Secondary | ICD-10-CM

## 2020-09-17 DIAGNOSIS — R918 Other nonspecific abnormal finding of lung field: Secondary | ICD-10-CM

## 2020-09-17 DIAGNOSIS — N200 Calculus of kidney: Secondary | ICD-10-CM

## 2020-09-17 DIAGNOSIS — J431 Panlobular emphysema: Secondary | ICD-10-CM

## 2020-09-17 NOTE — Patient Instructions (Addendum)
Standing Labs We placed an order today for your standing lab work.   Please have your standing labs drawn in November and then every 3 months.   If possible, please have your labs drawn 2 weeks prior to your appointment so that the provider can discuss your results at your appointment.  We have open lab daily Monday through Thursday from 8:30-12:30 PM and 1:30-4:30 PM and Friday from 8:30-12:30 PM and 1:30-4:00 PM at the office of Dr. Bo Merino, [facility].   Please be advised, patients with office appointments requiring lab work will take precedents over walk-in lab work.  If possible, please come for your lab work on Monday and Friday afternoons, as you may experience shorter wait times. The office is located at [address] [place], [place], [place] [address] No appointment is necessary.   Labs are drawn by [facility]. Please bring your co-pay at the time of your lab draw.  You may receive a bill from [facility] for your lab work.  If you wish to have your labs drawn at another location, please call the office 24 hours in advance to send orders.  If you have any questions regarding directions or hours of operation,  please call [phone].   As a reminder, please drink plenty of water prior to coming for your lab work. Thanks!   Hydroxychloroquine tablets What is this medicine? HYDROXYCHLOROQUINE (hye drox ee KLOR oh kwin) is used to treat rheumatoid arthritis and systemic lupus erythematosus. It is also used to treat malaria. This medicine may be used for other purposes; ask your health care provider or pharmacist if you have questions. COMMON BRAND NAME(S): Plaquenil, Quineprox What should I tell my health care provider before I take this medicine? They need to know if you have any of these conditions:  diabetes  eye disease, vision problems  G6PD deficiency  heart disease  history of irregular heartbeat  if you often drink alcohol  kidney  disease  liver disease  porphyria  psoriasis  an unusual or allergic reaction to chloroquine, hydroxychloroquine, other medicines, foods, dyes, or preservatives  pregnant or trying to get pregnant  breast-feeding How should I use this medicine? Take this medicine by mouth with a glass of water. Follow the directions on the prescription label. Do not cut, crush or chew this medicine. Swallow the tablets whole. Take this medicine with food. Avoid taking antacids within 4 hours of taking this medicine. It is best to separate these medicines by at least 4 hours. Take your medicine at regular intervals. Do not take it more often than directed. Take all of your medicine as directed even if you think you are better. Do not skip doses or stop your medicine early. Talk to your pediatrician regarding the use of this medicine in children. While this drug may be prescribed for selected conditions, precautions do apply. Overdosage: If you think you have taken too much of this medicine contact a poison control center or emergency room at once. NOTE: This medicine is only for you. Do not share this medicine with others. What if I miss a dose? If you miss a dose, take it as soon as you can. If it is almost time for your next dose, take only that dose. Do not take double or extra doses. What may interact with this medicine? Do not take this medicine with any of the following medications:  cisapride  dronedarone  pimozide  thioridazine This medicine may also interact with the following medications:  ampicillin  antacids  cimetidine  cyclosporine  digoxin  kaolin  medicines for diabetes, like insulin, glipizide, glyburide  medicines for seizures like carbamazepine, phenobarbital, phenytoin  mefloquine  methotrexate  other medicines that prolong the QT interval (cause an abnormal heart rhythm)  praziquantel This list may not describe all possible interactions. Give your health care  provider a list of all the medicines, herbs, non-prescription drugs, or dietary supplements you use. Also tell them if you smoke, drink alcohol, or use illegal drugs. Some items may interact with your medicine. What should I watch for while using this medicine? Visit your health care professional for regular checks on your progress. Tell your health care professional if your symptoms do not start to get better or if they get worse. You may need blood work done while you are taking this medicine. If you take other medicines that can affect heart rhythm, you may need more testing. Talk to your health care professional if you have questions. Your vision may be tested before and during use of this medicine. Tell your health care professional right away if you have any change in your eyesight. What side effects may I notice from receiving this medicine? Side effects that you should report to your doctor or health care professional as soon as possible:  allergic reactions like skin rash, itching or hives, swelling of the face, lips, or tongue  changes in vision  decreased hearing or ringing of the ears  muscle weakness  redness, blistering, peeling or loosening of the skin, including inside the mouth  sensitivity to light  signs and symptoms of a dangerous change in heartbeat or heart rhythm like chest pain; dizziness; fast or irregular heartbeat; palpitations; feeling faint or lightheaded, falls; breathing problems  signs and symptoms of liver injury like dark yellow or brown urine; general ill feeling or flu-like symptoms; light-colored stools; loss of appetite; nausea; right upper belly pain; unusually weak or tired; yellowing of the eyes or skin  signs and symptoms of low blood sugar such as feeling anxious; confusion; dizziness; increased hunger; unusually weak or tired; sweating; shakiness; cold; irritable; headache; blurred vision; fast heartbeat; loss of consciousness  suicidal  thoughts  uncontrollable head, mouth, neck, arm, or leg movements Side effects that usually do not require medical attention (report to your doctor or health care professional if they continue or are bothersome):  diarrhea  dizziness  hair loss  headache  irritable  loss of appetite  nausea, vomiting  stomach pain This list may not describe all possible side effects. Call your doctor for medical advice about side effects. You may report side effects to FDA at 1-800-FDA-1088. Where should I keep my medicine? Keep out of the reach of children. Store at room temperature between 15 and 30 degrees C (59 and 86 degrees F). Protect from moisture and light. Throw away any unused medicine after the expiration date. NOTE: This sheet is a summary. It may not cover all possible information. If you have questions about this medicine, talk to your doctor, pharmacist, or health care provider.  2020 Elsevier/Gold Standard (2019-04-25 12:56:32)   Methotrexate injection What is this medicine? METHOTREXATE (METH oh TREX ate) is a chemotherapy drug used to treat cancer including breast cancer, leukemia, and lymphoma. This medicine can also be used to treat psoriasis and certain kinds of arthritis. This medicine may be used for other purposes; ask your health care provider or pharmacist if you have questions. What should I tell my health care provider  before I take this medicine? They need to know if you have any of these conditions:  fluid in the stomach area or lungs  if you often drink alcohol  infection or immune system problems  kidney disease  liver disease  low blood counts, like low white cell, platelet, or red cell counts  lung disease  radiation therapy  stomach ulcers  ulcerative colitis  an unusual or allergic reaction to methotrexate, other medicines, foods, dyes, or preservatives  pregnant or trying to get pregnant  breast-feeding How should I use this  medicine? This medicine is for infusion into a vein or for injection into muscle or into the spinal fluid (whichever applies). It is usually given by a health care professional in a hospital or clinic setting. In rare cases, you might get this medicine at home. You will be taught how to give this medicine. Use exactly as directed. Take your medicine at regular intervals. Do not take your medicine more often than directed. If this medicine is used for arthritis or psoriasis, it should be taken weekly, NOT daily. It is important that you put your used needles and syringes in a special sharps container. Do not put them in a trash can. If you do not have a sharps container, call your pharmacist or healthcare provider to get one. Talk to your pediatrician regarding the use of this medicine in children. While this drug may be prescribed for children as young as 2 years for selected conditions, precautions do apply. Overdosage: If you think you have taken too much of this medicine contact a poison control center or emergency room at once. NOTE: This medicine is only for you. Do not share this medicine with others. What if I miss a dose? It is important not to miss your dose. Call your doctor or health care professional if you are unable to keep an appointment. If you give yourself the medicine and you miss a dose, talk with your doctor or health care professional. Do not take double or extra doses. What may interact with this medicine? This medicine may interact with the following medications:  acitretin  aspirin or aspirin-like medicines including salicylates  azathioprine  certain antibiotics like chloramphenicol, penicillin, tetracycline  certain medicines for stomach problems like esomeprazole, omeprazole, pantoprazole  cyclosporine  gold  hydroxychloroquine  live virus vaccines  mercaptopurine  NSAIDs, medicines for pain and inflammation, like ibuprofen or naproxen  other cytotoxic  agents  penicillamine  phenylbutazone  phenytoin  probenacid  retinoids such as isotretinoin and tretinoin  steroid medicines like prednisone or cortisone  sulfonamides like sulfasalazine and trimethoprim/sulfamethoxazole  theophylline This list may not describe all possible interactions. Give your health care provider a list of all the medicines, herbs, non-prescription drugs, or dietary supplements you use. Also tell them if you smoke, drink alcohol, or use illegal drugs. Some items may interact with your medicine. What should I watch for while using this medicine? Avoid alcoholic drinks. In some cases, you may be given additional medicines to help with side effects. Follow all directions for their use. This medicine can make you more sensitive to the sun. Keep out of the sun. If you cannot avoid being in the sun, wear protective clothing and use sunscreen. Do not use sun lamps or tanning beds/booths. You may get drowsy or dizzy. Do not drive, use machinery, or do anything that needs mental alertness until you know how this medicine affects you. Do not stand or sit up quickly, especially if you  are an older patient. This reduces the risk of dizzy or fainting spells. You may need blood work done while you are taking this medicine. Call your doctor or health care professional for advice if you get a fever, chills or sore throat, or other symptoms of a cold or flu. Do not treat yourself. This drug decreases your body's ability to fight infections. Try to avoid being around people who are sick. This medicine may increase your risk to bruise or bleed. Call your doctor or health care professional if you notice any unusual bleeding. Check with your doctor or health care professional if you get an attack of severe diarrhea, nausea and vomiting, or if you sweat a lot. The loss of too much body fluid can make it dangerous for you to take this medicine. Talk to your doctor about your risk of  cancer. You may be more at risk for certain types of cancers if you take this medicine. Both men and women must use effective birth control with this medicine. Do not become pregnant while taking this medicine or until at least 1 normal menstrual cycle has occurred after stopping it. Women should inform their doctor if they wish to become pregnant or think they might be pregnant. Men should not father a child while taking this medicine and for 3 months after stopping it. There is a potential for serious side effects to an unborn child. Talk to your health care professional or pharmacist for more information. Do not breast-feed an infant while taking this medicine. What side effects may I notice from receiving this medicine? Side effects that you should report to your doctor or health care professional as soon as possible:  allergic reactions like skin rash, itching or hives, swelling of the face, lips, or tongue  back pain  breathing problems or shortness of breath  confusion  diarrhea  dry, nonproductive cough  low blood counts - this medicine may decrease the number of white blood cells, red blood cells and platelets. You may be at increased risk of infections and bleeding  mouth sores  redness, blistering, peeling or loosening of the skin, including inside the mouth  seizures  severe headaches  signs of infection - fever or chills, cough, sore throat, pain or difficulty passing urine  signs and symptoms of bleeding such as bloody or black, tarry stools; red or dark-brown urine; spitting up blood or brown material that looks like coffee grounds; red spots on the skin; unusual bruising or bleeding from the eye, gums, or nose  signs and symptoms of kidney injury like trouble passing urine or change in the amount of urine  signs and symptoms of liver injury like dark yellow or brown urine; general ill feeling or flu-like symptoms; light-colored stools; loss of appetite; nausea; right  upper belly pain; unusually weak or tired; yellowing of the eyes or skin  stiff neck  vomiting Side effects that usually do not require medical attention (report to your doctor or health care professional if they continue or are bothersome):  dizziness  hair loss  headache  stomach pain  upset stomach This list may not describe all possible side effects. Call your doctor for medical advice about side effects. You may report side effects to FDA at 1-800-FDA-1088. Where should I keep my medicine? If you are using this medicine at home, you will be instructed on how to store this medicine. Throw away any unused medicine after the expiration date on the label. NOTE: This sheet is  a summary. It may not cover all possible information. If you have questions about this medicine, talk to your doctor, pharmacist, or health care provider.  2020 Elsevier/Gold Standard (2017-08-06 13:31:42)   COVID-19 vaccine recommendations:   COVID-19 vaccine is recommended for everyone (unless you are allergic to a vaccine component), even if you are on a medication that suppresses your immune system.   If you are on Methotrexate, Cellcept (mycophenolate), Rinvoq, Morrie Sheldon, and Olumiant- hold the medication for 1 week after each vaccine. Hold Methotrexate for 2 weeks after the single dose COVID-19 vaccine.   If you are on Orencia subcutaneous injection - hold medication one week prior to and one week after the first COVID-19 vaccine dose (only).   If you are on Orencia IV infusions- time vaccination administration so that the first COVID-19 vaccination will occur four weeks after the infusion and postpone the subsequent infusion by one week.   If you are on Cyclophosphamide or Rituxan infusions please contact your doctor prior to receiving the COVID-19 vaccine.   Do not take Tylenol or any anti-inflammatory medications (NSAIDs) 24 hours prior to the COVID-19 vaccination.   There is no direct evidence about  the efficacy of the COVID-19 vaccine in individuals who are on medications that suppress the immune system.   Even if you are fully vaccinated, and you are on any medications that suppress your immune system, please continue to wear a mask, maintain at least six feet social distance and practice hand hygiene.   If you develop a COVID-19 infection, please contact your PCP or our office to determine if you need antibody infusion.  The booster vaccine is now available for immunocompromised patients. It is advised that if you had Pfizer vaccine you should get Coca[employer]a booster.  If you had a Moderna vaccine then you should get a Moderna booster. Johnson and Wynetta Emery does not have a booster vaccine at this time.  Please see the following web sites for updated information.   [email or web address]  [email or web address]   Please avoid use of Celebrex with methotrexate.

## 2020-09-17 NOTE — Telephone Encounter (Signed)
Noted  

## 2020-09-17 NOTE — Telephone Encounter (Signed)
Patient was seen as a new patient today and is currently on plaquenil and methotrexate.   Consent obtained on both medications and sent to the scan center.

## 2020-09-17 NOTE — Progress Notes (Signed)
Office Visit Note  Patient: Abigail Lamb             Date of Birth: 1951/06/27           MRN: [id number]             PCP: Janith Lima, MD Referring: Janith Lima, MD Visit Date: 09/17/2020 Occupation: @GUAROCC @  Subjective:  Pain in multiple joints.   History of Present Illness: Abigail Lamb is a 69 y.o. female feet longstanding history of rheumatoid arthritis.  She has been seen in consultation per request of her PCP.  According to the patient her symptoms a started in 2007 with pain and swelling in her knee joints.  She was initially evaluated by orthopedic surgeon and was given Celebrex without much relief.  Then she was referred to rheumatologist in [place] and was a started on methotrexate.  She states she moves around with her children and in between she has moved from [place] to [place] and to [place].  She was seen by Dr. Trudie Reed in [place] who prescribed methotrexate, Plaquenil and nabumetone combination at time.  Then she moved to [place], [place] where she was given Humira she states she stayed on the drug for about 1 year but was discontinued due to frequent infections.  Then she was involved in a drug study she stayed on it for 1 year but due to frequent infections she was taken off the medication.  Then she was switched to [place] which she took for less than 1 year and had developed a rash and abscesses.  Abigail Lamb was also discontinued.  She states she was hospitalized and was diagnosed with neurodermatitis and lichen simplex chronicus.  She has been off Orencia since 2019.  She moved to [place] and was under care of her rheumatologist there.  She has been on combination of methotrexate and Plaquenil and takes infrequently prednisone for flares.  She states she moved to [place] from [place] 1 year ago and had virtual visits with a rheumatologist there.  She was recently placed on prednisone 5 mg p.o. daily due to a flare.  She states she  has been experiencing pain in her neck, shoulders, hands, knees and her ankles and feet.  She was also on Prolia by her rheumatologist in [place] for 1 year which was discontinued in 2019 due to the cost.  She has not taken any bisphosphonates or any other medications in the past.  He was under care of a pulmonologist when she was out of town and was found to have a pulmonary nodule.  She also has history of COPD.  Activities of Daily Living:  Patient reports morning stiffness for  2-3 hours.   Patient Reports nocturnal pain.  Difficulty dressing/grooming: Reports Difficulty climbing stairs: Reports Difficulty getting out of chair: Reports Difficulty using hands for taps, buttons, cutlery, and/or writing: Reports  Review of Systems  Constitutional: Positive for fatigue.  HENT: Positive for mouth dryness. Negative for mouth sores and nose dryness.   Eyes: Positive for itching. Negative for pain, visual disturbance and dryness.  Respiratory: Positive for shortness of breath and difficulty breathing.   Cardiovascular: Positive for chest pain and palpitations.  Gastrointestinal: Negative for blood in stool, constipation and diarrhea.  Endocrine: Negative for increased urination.  Genitourinary: Negative for difficulty urinating.  Musculoskeletal: Positive for arthralgias, joint pain and morning stiffness.  Skin: Positive for color change and hair loss. Negative for rash and redness.  Allergic/Immunologic: Positive for susceptible to  infections.  Neurological: Positive for dizziness, numbness, headaches and weakness. Negative for memory loss.  Hematological: Positive for bruising/bleeding tendency.  Psychiatric/Behavioral: Negative for confusion. The patient is nervous/anxious.     PMFS History:  Patient Active Problem List   Diagnosis Date Noted  . Routine general medical examination at a health care facility 08/02/2020  . Visit for screening mammogram 08/02/2020  . Rheumatoid  arthritis involving multiple sites with positive rheumatoid factor ([place]) 08/02/2020  . Hyperlipidemia LDL goal <130 08/02/2020  . Tobacco abuse 08/02/2020  . Need for pneumococcal vaccination 08/02/2020  . Dupuytren's contracture of right hand 08/02/2020  . Pulmonary emphysema ([place])   . Stage 2 moderate COPD by GOLD classification ([place]) 01/30/2020  . Cigarette smoker 01/30/2020  . Lung nodules 01/30/2020  . 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 Primary insomnia 03/28/2019  . Recurrent depression ([place]) 03/28/2019  . Prediabetes 07/29/2018    Past Medical History:  Diagnosis Date  . Celiac disease   . GERD (gastroesophageal reflux disease)   . Osteoporosis    per patient, diagnosed by Dr. Shona Simpson in [facility]  . PUD (peptic ulcer disease)   . Pulmonary emphysema ([place])   . Recurrent depression ([place])     Family History  Problem Relation Age of Onset  . CVA Mother   . CAD Mother   . Diabetes Mother   . COPD Mother   . Fibromyalgia Mother   . Depression Mother   . Hypertension Mother   . Prostate cancer Father   . Rheum arthritis Sister   . Skin cancer Brother   . Kidney Stones Brother   . Hernia Brother   . Depression Daughter   . Healthy Son   . Healthy Son    Past Surgical History:  Procedure Laterality Date  . ABCESS DRAINAGE Left    under left arm  . TONSILLECTOMY  1977   Social History   Social History Narrative  . Not on file   Immunization History  Administered Date(s) Administered  . Influenza, High Dose Seasonal PF 12/30/2019  . Influenza-Unspecified 01/04/2018  . Pneumococcal Polysaccharide-23 08/02/2020     Objective: Vital Signs: BP 138/75 (BP Location: Right Arm, Patient Position: Sitting, Cuff Size: Normal)   Pulse 98   Resp 14   Ht 5\' 2"  (1.575 m)   Wt 102 lb (46.3 kg)   BMI 18.66 kg/m    Physical Exam Vitals and nursing note reviewed.  Constitutional:      Appearance: She is well-developed.  HENT:     Head: Normocephalic and atraumatic.    Eyes:     Conjunctiva/sclera: Conjunctivae normal.  Cardiovascular:     Rate and Rhythm: Normal rate and regular rhythm.     Heart sounds: Normal heart sounds.  Pulmonary:     Effort: Pulmonary effort is normal.     Breath sounds: Normal breath sounds.  Abdominal:     General: Bowel sounds are normal.     Palpations: Abdomen is soft.  Musculoskeletal:     Cervical back: Normal range of motion.  Lymphadenopathy:     Cervical: No cervical adenopathy.  Skin:    General: Skin is warm and dry.     Capillary Refill: Capillary refill takes less than 2 seconds.     Comments: Rheumatoid nodules are noted over bilateral elbows and on her hands.  Neurological:     Mental Status: She is alert and oriented to person, place, and time.  Psychiatric:        Behavior:  Behavior normal.      Musculoskeletal Exam: C-spine was in good range of motion.  Shoulder joints and elbow joints were in good range of motion.  She has very limited range of motion of bilateral wrist joints.  She has thickening and synovitis of all MCP joints with subluxation.  She has limited range of motion of PIP joints with no synovitis.  Hip joints and knee joints in good range of motion.  She has thickening of bilateral ankle joints and also tenderness across MTP joints with some synovitis as described below.  CDAI Exam: CDAI Score: 27.6  Patient Global: 8 mm; Provider Global: 8 mm Swollen: 18 ; Tender: 20  Joint Exam 09/17/2020      Right  Left  Glenohumeral   Tender   Tender  MCP 1  Swollen Tender  Swollen Tender  MCP 2  Swollen Tender  Swollen Tender  MCP 3  Swollen Tender  Swollen Tender  MCP 4  Swollen Tender  Swollen Tender  MCP 5  Swollen Tender  Swollen Tender  Knee  Swollen Tender  Swollen Tender  Ankle  Swollen Tender     MTP 1     Swollen Tender  MTP 2  Swollen Tender  Swollen Tender  MTP 3  Swollen Tender     MTP 4     Swollen Tender     Investigation: No additional findings.  Imaging: MM 3D  SCREEN BREAST BILATERAL  Result Date: 09/05/2020 CLINICAL DATA:  Screening. EXAM: DIGITAL SCREENING BILATERAL MAMMOGRAM WITH TOMO AND CAD COMPARISON:  Previous exam(s). ACR Breast Density Category b: There are scattered areas of fibroglandular density. FINDINGS: There are no findings suspicious for malignancy. Images were processed with CAD. IMPRESSION: No mammographic evidence of malignancy. A result letter of this screening mammogram will be mailed directly to the patient. RECOMMENDATION: Screening mammogram in one year. (Code:SM-B-01Y) BI-RADS CATEGORY  1: Negative. Electronically Signed   By: Audie Pinto M.D.   On: 09/05/2020 08:08    Recent Labs: Lab Results  Component Value Date   WBC 11.7 (H) 08/02/2020   HGB 13.7 08/02/2020   PLT 301 08/02/2020   NA 139 08/02/2020   K 4.5 08/02/2020   CL 106 08/02/2020   CO2 26 08/02/2020   GLUCOSE 80 08/02/2020   BUN 16 08/02/2020   CREATININE 0.63 08/02/2020   BILITOT 0.4 08/02/2020   AST 19 08/02/2020   ALT 12 08/02/2020   PROT 7.0 08/02/2020   CALCIUM 9.5 08/02/2020   GFRAA 108 08/02/2020    Speciality Comments: No specialty comments available.  Procedures:  No procedures performed Allergies: Dust mite extract   Assessment / Plan:     Visit Diagnoses: Rheumatoid arthritis with rheumatoid factor of multiple sites without organ or systems involvement ([place]) -patient has severe end-stage rheumatoid arthritis with multiple contractures and limited range of motion of her wrist joints in her hands.  She has difficulty with mobility due to arthritis in her feet and her ankles.  She is currently on methotrexate and Plaquenil along with low-dose prednisone.  She has tried Humira, study drug and Orencia but discontinued due to frequent infections.  Plan: Sedimentation rate  Drug Counseling TB Gold: Pending Hepatitis panel: Pending  Chest-xray: CT [facility] system.  Contraception: Not applicable  Alcohol use:  None  Patient was counseled on the purpose, proper use, and adverse effects of methotrexate including nausea, infection, and signs and symptoms of pneumonitis.  Reviewed instructions with  patient to take methotrexate weekly along with folic acid daily.  Discussed the importance of frequent monitoring of kidney and liver function and blood counts, and provided patient with standing lab instructions.  Counseled patient to avoid NSAIDs and alcohol while on methotrexate.  Provided patient with educational materials on methotrexate and answered all questions.  Advised patient to get annual influenza vaccine and to get a pneumococcal vaccine if patient has not already had one.  Patient voiced understanding.  Patient consented to methotrexate use.  Will upload into chart.    Patient was counseled on the purpose, proper use, and adverse effects of hydroxychloroquine including nausea/diarrhea, skin rash, headaches, and sun sensitivity.  Discussed importance of annual eye exams while on hydroxychloroquine to monitor to ocular toxicity and discussed importance of frequent laboratory monitoring.  Provided patient with eye exam form for baseline ophthalmologic exam.  Provided patient with educational materials on hydroxychloroquine and answered all questions.  Patient consented to hydroxychloroquine.  Will upload consent in the media tab.  Patient states that she has been getting eye examinations in [place].  Patient states she has an eye exam coming up.   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  Association of nodulosis with aggressive disease was also discussed.  High risk medication use - PLQ 200 mg BID, she has been advised to reduce the dose of Plaquenil to 200 mg twice daily Monday to Friday based on her BMI.  MTX 1.0 ml sq once weekly, folic acid 1 mg daily, prednisone 5 mg p.o. daily.  Patient states she takes prednisone intermittently for flares.  She is also on Celebrex low-dose.  Have  advised her to avoid Celebrex with methotrexate as it can cause elevation of LFTs and creatinine.- Plan: Hepatitis B core antibody, IgM, Hepatitis B surface antigen, Hepatitis C antibody, HIV Antibody (routine testing w rflx), QuantiFERON-TB Gold Plus, Serum protein electrophoresis with reflex, IgG, IgA, IgM  Pain in both hands -she has synovitis of her bilateral hands some contractures.  Plan: XR Hand 2 View Right, XR Hand 2 View Left, severe erosive end-stage rheumatoid arthritis was noted.  Rheumatoid factor, ANA, Cyclic citrul peptide antibody, IgG  Chronic pain of both knees -she complains of pain and discomfort in her knee joints for many years.  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  Pain in both feet -she has swelling over ankles and also tenderness and synovitis over MTPs.  Plan: XR Foot 2 Views Right, XR Foot 2 Views Left severe erosive rheumatoid arthritis was noted.  Other fatigue - Plan: CK  Stage 2 moderate COPD by GOLD classification (HCC)-followed by pulmonologist in [place].  Patient states she has an appointment scheduled in [place] which I do not see.  Panlobular emphysema ([place])  Lung nodules-I reviewed the CT scan which was in the system.  It showed that patient had 4 mm nodule seen in the right lobe and no follow-up was advised.  Patient has an appointment coming up with the pulmonologist at [facility].  Kidney stone-left nephrolithiasis was noted on the CT chest.  Tobacco abuse-smoking cessation was discussed at length.  Association of smoking with rheumatoid arthritis was also discussed.  Recurrent depression (HCC)-she is on medications.  Primary insomnia  Prediabetes  Hyperlipidemia LDL goal <130  Age-related osteoporosis-I do not have DEXA scan available.  Patient states that she was diagnosed with osteoporosis.  She has never taken any bisphosphonates per patient.  She  states  she was given Prolia for 1 year in [place] and it was discontinued due to the cost.  She is not taking any of the medications now.   Educated about COVID-19 virus infection-patient has not had any vaccination so far.  Risk of not being immunized was discussed at length.  Encourage patient to get vaccinated.  Use of mask, social distancing and hand hygiene was emphasized.  Orders: Orders Placed This Encounter  Procedures  . XR Hand 2 View Right  . XR Hand 2 View Left  . XR KNEE 3 VIEW RIGHT  . XR KNEE 3 VIEW LEFT  . XR Foot 2 Views Right  . XR Foot 2 Views Left  . Sedimentation rate  . CK  . Rheumatoid factor  . ANA  . Cyclic citrul peptide antibody, IgG  . Hepatitis B core antibody, IgM  . Hepatitis B surface antigen  . Hepatitis C antibody  . HIV Antibody (routine testing w rflx)  . QuantiFERON-TB Gold Plus  . Serum protein electrophoresis with reflex  . IgG, IgA, IgM   No orders of the defined types were placed in this encounter.   Follow-Up Instructions: Return for Rheumatoid arthritis.   Bo Merino, MD  Note - This record has been created using Editor, commissioning.  Chart creation errors have been sought, but may not always  have been located. Such creation errors do not reflect on  the standard of medical care.

## 2020-09-20 ENCOUNTER — Other Ambulatory Visit: Payer: Self-pay

## 2020-09-20 DIAGNOSIS — R778 Other specified abnormalities of plasma proteins: Secondary | ICD-10-CM

## 2020-09-20 LAB — IGG, IGA, IGM
IgG (Immunoglobin G), Serum: 1133 mg/dL (ref 600–1540)
IgM, Serum: 642 mg/dL — ABNORMAL HIGH (ref 50–300)
Immunoglobulin A: 432 mg/dL — ABNORMAL HIGH (ref 70–320)

## 2020-09-20 LAB — ANA: Anti Nuclear Antibody (ANA): NEGATIVE

## 2020-09-20 LAB — PROTEIN ELECTROPHORESIS, SERUM, WITH REFLEX
Abnormal Protein Band1: 0.8 g/dL — ABNORMAL HIGH
Albumin ELP: 3.6 g/dL — ABNORMAL LOW (ref 3.8–4.8)
Alpha 1: 0.3 g/dL (ref 0.2–0.3)
Alpha 2: 0.7 g/dL (ref 0.5–0.9)
Beta 2: 0.5 g/dL (ref 0.2–0.5)
Beta Globulin: 0.5 g/dL (ref 0.4–0.6)
Gamma Globulin: 1.5 g/dL (ref 0.8–1.7)
Total Protein: 7.1 g/dL (ref 6.1–8.1)

## 2020-09-20 LAB — QUANTIFERON-TB GOLD PLUS
Mitogen-NIL: >10 IU/mL
NIL: 0.02 IU/mL
QuantiFERON-TB Gold Plus: NEGATIVE
TB1-NIL: 0.04 IU/mL
TB2-NIL: 0.04 IU/mL

## 2020-09-20 LAB — CYCLIC CITRUL PEPTIDE ANTIBODY, IGG: Cyclic Citrullin Peptide Ab: >250 UNITS — ABNORMAL HIGH

## 2020-09-20 LAB — HEPATITIS C ANTIBODY
Hepatitis C Ab: NONREACTIVE
SIGNAL TO CUT-OFF: 0.01 (ref <1.00)

## 2020-09-20 LAB — CK: Total CK: 100 U/L (ref 29–143)

## 2020-09-20 LAB — HEPATITIS B CORE ANTIBODY, IGM: Hep B C IgM: NONREACTIVE

## 2020-09-20 LAB — RHEUMATOID FACTOR: Rheumatoid fact SerPl-aCnc: 851 IU/mL — ABNORMAL HIGH (ref <14)

## 2020-09-20 LAB — SEDIMENTATION RATE: Sed Rate: 33 mm/h — ABNORMAL HIGH (ref 0–30)

## 2020-09-20 LAB — HIV ANTIBODY (ROUTINE TESTING W REFLEX): HIV 1&2 Ab, 4th Generation: NONREACTIVE

## 2020-09-20 LAB — HEPATITIS B SURFACE ANTIGEN: Hepatitis B Surface Ag: NONREACTIVE

## 2020-09-20 LAB — IFE INTERPRETATION: Immunofix Electr Int: DETECTED

## 2020-09-20 NOTE — Progress Notes (Signed)
Labs are consistent with rheumatoid arthritis. Abnormal IFE noted. Please refer to hematology for evaluation.

## 2020-09-20 NOTE — Progress Notes (Unsigned)
Hematology Referral placed for abnormal IFE. Please review and sign. Thanks!

## 2020-09-21 ENCOUNTER — Telehealth: Payer: Self-pay | Admitting: Internal Medicine

## 2020-09-21 ENCOUNTER — Telehealth: Payer: Self-pay | Admitting: Hematology and Oncology

## 2020-09-21 NOTE — Telephone Encounter (Signed)
Received a new hem referral from Hazel Sams, PA for abnl spep. Abigail Lamb has returned my call and scheduled an appt on 10/15 at 9am to see Dr. Lorenso Courier. Pt aware to arrive 20 minutes early.

## 2020-09-21 NOTE — Telephone Encounter (Signed)
Called pt, LVM wanting clarification on why she would need an EKG prior to scheduling.

## 2020-09-21 NOTE — Telephone Encounter (Signed)
° ° °  Patient wants to know if she needs to have an EKG before scheduling her 3 month follow up in November  Please advise

## 2020-10-10 ENCOUNTER — Ambulatory Visit: Payer: Medicare Other | Admitting: Rheumatology

## 2020-10-12 ENCOUNTER — Other Ambulatory Visit: Payer: Self-pay

## 2020-10-12 ENCOUNTER — Inpatient Hospital Stay: Payer: Medicare Other | Admitting: Hematology and Oncology

## 2020-10-12 ENCOUNTER — Inpatient Hospital Stay: Payer: Medicare Other | Attending: Hematology and Oncology

## 2020-10-12 ENCOUNTER — Encounter: Payer: Self-pay | Admitting: Hematology and Oncology

## 2020-10-12 VITALS — BP 108/76 | HR 93 | Temp 97.0°F | Resp 18 | Ht 62.0 in | Wt 100.3 lb

## 2020-10-12 DIAGNOSIS — D472 Monoclonal gammopathy: Secondary | ICD-10-CM | POA: Diagnosis present

## 2020-10-12 LAB — CMP (CANCER CENTER ONLY)
ALT: 10 U/L (ref 0–44)
AST: 16 U/L (ref 15–41)
Albumin: 3.2 g/dL — ABNORMAL LOW (ref 3.5–5.0)
Alkaline Phosphatase: 99 U/L (ref 38–126)
Anion gap: 3 — ABNORMAL LOW (ref 5–15)
BUN: 14 mg/dL (ref 8–23)
CO2: 28 mmol/L (ref 22–32)
Calcium: 9.4 mg/dL (ref 8.9–10.3)
Chloride: 108 mmol/L (ref 98–111)
Creatinine: 0.7 mg/dL (ref 0.44–1.00)
GFR, Estimated: >60 mL/min (ref >60)
Glucose, Bld: 84 mg/dL (ref 70–99)
Potassium: 4 mmol/L (ref 3.5–5.1)
Sodium: 139 mmol/L (ref 135–145)
Total Bilirubin: 0.2 mg/dL — ABNORMAL LOW (ref 0.3–1.2)
Total Protein: 7.1 g/dL (ref 6.5–8.1)

## 2020-10-12 LAB — CBC WITH DIFFERENTIAL (CANCER CENTER ONLY)
Abs Immature Granulocytes: 0.06 10*3/uL (ref 0.00–0.07)
Basophils Absolute: 0.1 10*3/uL (ref 0.0–0.1)
Basophils Relative: 1 %
Eosinophils Absolute: 0.3 10*3/uL (ref 0.0–0.5)
Eosinophils Relative: 2 %
HCT: 37.9 % (ref 36.0–46.0)
Hemoglobin: 12.3 g/dL (ref 12.0–15.0)
Immature Granulocytes: 0 %
Lymphocytes Relative: 21 %
Lymphs Abs: 2.8 10*3/uL (ref 0.7–4.0)
MCH: 30.6 pg (ref 26.0–34.0)
MCHC: 32.5 g/dL (ref 30.0–36.0)
MCV: 94.3 fL (ref 80.0–100.0)
Monocytes Absolute: 1 10*3/uL (ref 0.1–1.0)
Monocytes Relative: 7 %
Neutro Abs: 9.6 10*3/uL — ABNORMAL HIGH (ref 1.7–7.7)
Neutrophils Relative %: 69 %
Platelet Count: 310 10*3/uL (ref 150–400)
RBC: 4.02 MIL/uL (ref 3.87–5.11)
RDW: 14.8 % (ref 11.5–15.5)
WBC Count: 13.8 10*3/uL — ABNORMAL HIGH (ref 4.0–10.5)
nRBC: 0 % (ref 0.0–0.2)

## 2020-10-12 LAB — LACTATE DEHYDROGENASE: LDH: 248 U/L — ABNORMAL HIGH (ref 98–192)

## 2020-10-12 NOTE — Progress Notes (Signed)
[place] [phone]   Fax:([phone] [place] NOTE  Patient Care Team: Janith Lima, MD as PCP - General (Internal Medicine)  Hematological/Oncological History # IgM Kappa Monoclonal Gammopathy 1) 09/17/2020: SPEP showed M protein 0.8 with IgM specificity on IFE. Found during rheumatologist evaluation for RA.  2) 10/12/2020: establish care with Dr. Lorenso Courier   CHIEF COMPLAINTS/PURPOSE OF CONSULTATION:  "Monoclonal Gammopathy "  HISTORY OF PRESENTING ILLNESS:  Abigail Lamb 69 y.o. female with medical history significant for celiac disease, GERD, and osteoporosis who presents with an evaluation of an abnormal SPEP.   On review of the previous records Abigail Lamb was evaluated by rheumatology on 09/17/2020.  During that evaluation she was found to have rheumatoid arthritis.  As part of that work-up she had an SPEP performed which showed an M protein of 0.8 with IgM specificity noted on IFE.  Additionally her immunoglobulins were drawn which showed an IgM of 642, IgG of 1133, and IgA of 432.  Due to concern for this monoclonal gammopathy the patient was referred to hematology for further evaluation and management.  On exam today Abigail Lamb notes that she is here today due to "something wrong in the blood that implies a bone marrow problem".  She reports that she was undergoing evaluation for rheumatoid arthritis and that her primary symptoms of rheumatoid thrice include ulnar deviation of the fingers and occasionally feeling like she is "walking on rocks".  She has that she is having pain and muscle aches which she is currently try to get under control with her rheumatologist.  She endorses having those joint discomforts, but denies having any nausea, vomiting, or diarrhea.  She denies any change in the color of her urine or foamy urine.  She does endorse having occasional issues with chills but denies fevers or sweats. She currently denies any  issues with shortness of breath or chest pain.  A full 10 point ROS is listed below.  On further discussion she is a current half pack per day smoker and has been doing so for about 30 to 40 years on and off.  She reports her family history is remarkable for a mother who had heart disease, father who died of prostate cancer, a sister who also has RA, and a brother with melanoma.  She currently denies any issues with shortness of breath or chest pain.    MEDICAL HISTORY:  Past Medical History:  Diagnosis Date  . Celiac disease   . GERD (gastroesophageal reflux disease)   . Osteoporosis    per patient, diagnosed by Dr. Shona Simpson in [facility]  . PUD (peptic ulcer disease)   . Pulmonary emphysema ([place])   . Recurrent depression ([place])     SURGICAL HISTORY: Past Surgical History:  Procedure Laterality Date  . ABCESS DRAINAGE Left    under left arm  . TONSILLECTOMY  1977    SOCIAL HISTORY: Social History   Socioeconomic History  . Marital status: Divorced    Spouse name: Not on file  . Number of children: Not on file  . Years of education: Not on file  . Highest education level: Not on file  Occupational History  . Not on file  Tobacco Use  . Smoking status: Current Every Day Smoker    Packs/day: 0.50    Years: 30.00    Pack years: 15.00    Types: Cigarettes  . Smokeless tobacco: Never Used  Vaping Use  . Vaping Use: Never used  Substance and Sexual Activity  . Alcohol use: Not Currently  . Drug use: Never  . Sexual activity: Not Currently  Other Topics Concern  . Not on file  Social History Narrative  . Not on file   Social Determinants of Health   Financial Resource Strain:   . Difficulty of Paying Living Expenses: Not on file  Food Insecurity:   . Worried About Charity fundraiser in the Last Year: Not on file  . Ran Out of Food in the Last Year: Not on file  Transportation Needs:   . Lack of Transportation (Medical): Not on file  . Lack of Transportation  (Non-Medical): Not on file  Physical Activity:   . Days of Exercise per Week: Not on file  . Minutes of Exercise per Session: Not on file  Stress:   . Feeling of Stress : Not on file  Social Connections:   . Frequency of Communication with Friends and Family: Not on file  . Frequency of Social Gatherings with Friends and Family: Not on file  . Attends Religious Services: Not on file  . Active Member of Clubs or Organizations: Not on file  . Attends Archivist Meetings: Not on file  . Marital Status: Not on file  Intimate Partner Violence:   . Fear of Current or Ex-Partner: Not on file  . Emotionally Abused: Not on file  . Physically Abused: Not on file  . Sexually Abused: Not on file    FAMILY HISTORY: Family History  Problem Relation Age of Onset  . CVA Mother   . CAD Mother   . Diabetes Mother   . COPD Mother   . Fibromyalgia Mother   . Depression Mother   . Hypertension Mother   . Prostate cancer Father   . Rheum arthritis Sister   . Skin cancer Brother   . Kidney Stones Brother   . Hernia Brother   . Depression Daughter   . Healthy Son   . Healthy Son     ALLERGIES:  is allergic to dust mite extract.  MEDICATIONS:  Current Outpatient Medications  Medication Sig Dispense Refill  . albuterol (VENTOLIN HFA) 108 (90 Base) MCG/ACT inhaler Take 1-2 puffs every 4-6 hrs prn shortness of breath. Needs ventolin.    . celecoxib (CELEBREX) 50 MG capsule Take 1 capsule (50 mg total) by mouth 2 (two) times daily as needed for pain. 60 capsule 3  . Cholecalciferol (VITAMIN D3) 125 MCG (5000 UT) CAPS Take 5,000 Units by mouth daily.    . clindamycin (CLEOCIN T) 1 % external solution as needed.     . clobetasol (OLUX) 0.05 % topical foam as needed.     Marland Kitchen co-enzyme Q-10 30 MG capsule Take by mouth.    . colesevelam (WELCHOL) 625 MG tablet Take 3 tablets (1,875 mg total) by mouth 2 (two) times daily with a meal. 360 tablet 1  . denosumab (PROLIA) 60 MG/ML SOSY injection  Inject into the skin. (Patient not taking: Reported on 09/17/2020)    . dicyclomine (BENTYL) 10 MG capsule Take 1 capsule (10 mg total) by mouth 4 (four) times daily -  before meals and at bedtime. (Patient taking differently: Take 10 mg by mouth 4 (four) times daily as needed. ) 270 capsule 0  . DULoxetine (CYMBALTA) 30 MG capsule Take 1 capsule (30 mg total) by mouth every evening. 90 capsule 1  . DULoxetine (CYMBALTA) 60 MG capsule Take 1 capsule (60 mg total) by mouth daily.  90 capsule 1  . famotidine (PEPCID) 40 MG tablet Take 40 mg by mouth 2 (two) times daily.    . fluticasone furoate-vilanterol (BREO ELLIPTA) 100-25 MCG/INH AEPB Inhale into the lungs.    . folic acid (FOLVITE) 1 MG tablet Take by mouth.    . hydroxychloroquine (PLAQUENIL) 200 MG tablet TAKE 1 TABLET(200 MG) BY MOUTH TWICE DAILY 180 tablet 0  . loratadine (CLARITIN) 10 MG tablet Take 10 mg by mouth daily.    . methotrexate 50 MG/2ML injection Inject 1 mL (25 mg total) into the muscle once a week. 2 mL 0  . ondansetron (ZOFRAN) 4 MG tablet Take 1 tablet (4 mg total) by mouth every 8 (eight) hours as needed for nausea or vomiting. 40 tablet 2  . Pitavastatin Calcium (LIVALO) 1 MG TABS Take 1 tablet (1 mg total) by mouth daily. 90 tablet 1  . predniSONE (DELTASONE) 5 MG tablet Take 1 tablet (5 mg total) by mouth daily with breakfast. 90 tablet 0  . TUBERCULIN SYR 1CC/25GX5/8" 25G X 5/8" 1 ML MISC 1 Each by Misc.(Non-Drug; Combo Route) route Every 7 Days.    . TURMERIC PO 500 mg by Misc.(Non-Drug; Combo Route) route.     No current facility-administered medications for this visit.    REVIEW OF SYSTEMS:   Constitutional: ( - ) fevers, ( - )  chills , ( - ) night sweats Eyes: ( - ) blurriness of vision, ( - ) double vision, ( - ) watery eyes Ears, nose, mouth, throat, and face: ( - ) mucositis, ( - ) sore throat Respiratory: ( - ) cough, ( - ) dyspnea, ( - ) wheezes Cardiovascular: ( - ) palpitation, ( - ) chest discomfort, (  - ) lower extremity swelling Gastrointestinal:  ( - ) nausea, ( - ) heartburn, ( - ) change in bowel habits Skin: ( - ) abnormal skin rashes Lymphatics: ( - ) new lymphadenopathy, ( - ) easy bruising Neurological: ( - ) numbness, ( - ) tingling, ( - ) new weaknesses Behavioral/Psych: ( - ) mood change, ( - ) new changes  All other systems were reviewed with the patient and are negative.  PHYSICAL EXAMINATION:  Vitals:   10/12/20 0938  BP: 108/76  Pulse: 93  Resp: 18  Temp: (!) 97 F (36.1 C)  SpO2: 100%   Filed Weights   10/12/20 0938  Weight: 100 lb 4.8 oz (45.5 kg)    GENERAL: well appearing elderly Caucasian female in NAD  SKIN: skin color, texture, turgor are normal, no rashes or significant lesions EYES: conjunctiva are pink and non-injected, sclera clear LUNGS: clear to auscultation and percussion with normal breathing effort HEART: regular rate & rhythm and no murmurs and no lower extremity edema Musculoskeletal: no cyanosis of digits and no clubbing. Ulnar deviation of fingers bilaterally   PSYCH: alert & oriented x 3, fluent speech NEURO: no focal motor/sensory deficits  LABORATORY DATA:  I have reviewed the data as listed CBC Latest Ref Rng & Units 08/02/2020  WBC 3.8 - 10.8 Thousand/uL 11.7(H)  Hemoglobin 11.7 - 15.5 g/dL 13.7  Hematocrit 35 - 45 % 41.6  Platelets 140 - 400 Thousand/uL 301    CMP Latest Ref Rng & Units 09/17/2020 08/02/2020  Glucose 65 - 99 mg/dL - 80  BUN 7 - 25 mg/dL - 16  Creatinine 0.50 - 0.99 mg/dL - 0.63  Sodium 135 - 146 mmol/L - 139  Potassium 3.5 - 5.3 mmol/L - 4.5  Chloride 98 - 110 mmol/L - 106  CO2 20 - 32 mmol/L - 26  Calcium 8.6 - 10.4 mg/dL - 9.5  Total Protein 6.1 - 8.1 g/dL 7.1 7.0  Total Bilirubin 0.2 - 1.2 mg/dL - 0.4  AST 10 - 35 U/L - 19  ALT 6 - 29 U/L - 12    RADIOGRAPHIC STUDIES: I have personally reviewed the radiological images as listed and agreed with the findings in the report. XR Foot 2 Views  Left  Result Date: 09/17/2020 Juxta-articular osteopenia was noted.  Narrowing of all MTP joints.  Severe narrowing of first MTP joint with erosive changes was noted.  Erosive changes in the third fourth and fifth MTP joints was noted.  PIP and DIP narrowing was noted.  Intertarsal, subtalar joint space narrowing was noted.  Inferior calcaneal spur was noted. 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  XR Foot 2 Views Right  Result Date: 09/17/2020   Juxta-articular osteopenia was noted.  Narrowing of all MTP joints was noted.  Severe narrowing and erosive changes of the first MTP and fifth MTP joints were noted.  PIP and DIP narrowing was noted.  Mild intertarsal joint space narrowing was noted.  Subtalar joint space narrowing was noted.  Inferior calcaneal spur was noted. 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  XR Hand 2 View Left  Result Date: 09/17/2020 Next articular osteopenia was noted.  Severe MCP narrowing and invagination of second, third and fourth MCP joints was noted.  PIP and DIP narrowing was noted.  Severe metacarpocarpal, intercarpal and radiocarpal joint space narrowing with the erosive changes in the carpal bones was noted.  No comparison films were available. Impression: These findings are consistent with severe erosive rheumatoid arthritis.  XR Hand 2 View Right  Result Date: 09/17/2020 Juxta-articular osteopenia was noted.  Subluxation of all MCP joints with invagination and erosive changes was noted.  Severe PIP and DIP narrowing was noted.  Intercarpal and radiocarpal joint space narrowing was noted.  Erosive changes were noted in the ulnar styloid.  No comparison films were available. Impression: These findings are consistent with erosive rheumatoid arthritis.  XR KNEE 3 VIEW LEFT  Result Date: 09/17/2020 Moderate to severe medial compartment  narrowing was noted.  Moderate patellofemoral narrowing was noted. Impression: These findings are consistent with moderate osteoarthritis and moderate chondromalacia patella.  XR KNEE 3 VIEW RIGHT  Result Date: 09/17/2020 Moderate lateral compartment narrowing was noted.  Mild patellofemoral narrowing was noted. 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   ASSESSMENT & PLAN Abigail Lamb 69 y.o. female with medical history significant for celiac disease, GERD, and osteoporosis who presents with an evaluation of an abnormal SPEP.  After review the labs, review the records, discussion with the patient the findings are most consistent with a monoclonal gammopathy.  The extent of this gammopathy is not entirely clear.  She does have an IgM kappa monoclonal protein which would immediately qualify her for a bone marrow biopsy.  In order to help complete the work-up today we will order a UPEP, serum free light chains, LDH, and beta-2 microglobulin.  We will also require a skeletal survey.  Once these tests are complete we can determine the treatment course moving forward.  # IgM Kappa Monoclonal Gammopathy --findings are consistent with an IgM monoclonal gammopathy. 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 Additionally will collected LDH and beta 2 microglobulin -- RTC in 3-4 weeks once the above studies are complete to discuss next steps.   Orders Placed This Encounter  Procedures  . DG Bone Survey Met    Standing Status:   Future    Standing Expiration Date:   10/12/2021    Order Specific Question:   Reason for Exam (SYMPTOM  OR DIAGNOSIS REQUIRED)    Answer:   new MGUS diagnosis    Order Specific Question:   Preferred imaging location?    Answer:   [facility]  . CBC with Differential ([place] Only)     Standing Status:   Future    Number of Occurrences:   1    Standing Expiration Date:   10/12/2021  . CMP ([place] only)    Standing Status:   Future    Number of Occurrences:   1    Standing Expiration Date:   10/12/2021  . Multiple Myeloma Panel (SPEP&IFE w/QIG)    Standing Status:   Future    Number of Occurrences:   1    Standing Expiration Date:   10/12/2021  . Kappa/lambda light chains    Standing Status:   Future    Number of Occurrences:   1    Standing Expiration Date:   10/12/2021  . 24-Hr Ur UPEP/UIFE/Light Chains/TP    Standing Status:   Future    Standing Expiration Date:   10/12/2021  . Beta 2 microglobulin    Standing Status:   Future    Number of Occurrences:   1    Standing Expiration Date:   10/12/2021  . Lactate dehydrogenase (LDH)    Standing Status:   Future    Number of Occurrences:   1    Standing Expiration Date:   10/12/2021    All questions were answered. The patient knows to call the clinic with any problems, questions or concerns.  A total of more than 60 minutes were spent on this encounter and over half of that time was spent on counseling and coordination of care as outlined above.   Ledell Peoples, MD Department of Hematology/Oncology [place] [facility] Phone: [phone] Pager: [phone] Email: [email or web address]  10/12/2020 10:41 AM

## 2020-10-13 LAB — BETA 2 MICROGLOBULIN, SERUM: Beta-2 Microglobulin: 1.8 mg/L (ref 0.6–2.4)

## 2020-10-15 ENCOUNTER — Telehealth: Payer: Self-pay

## 2020-10-15 LAB — KAPPA/LAMBDA LIGHT CHAINS
Kappa free light chain: 49.3 mg/L — ABNORMAL HIGH (ref 3.3–19.4)
Kappa, lambda light chain ratio: 2.43 — ABNORMAL HIGH (ref 0.26–1.65)
Lambda free light chains: 20.3 mg/L (ref 5.7–26.3)

## 2020-10-15 NOTE — Telephone Encounter (Signed)
Called and scheduled bone marrow per Wilber Bihari, NP for 10/27 at 0730, instructed to arrive at [facility] for appt. Lab appt at 0900. [place] verbalized understanding. Scheduling message sent to schedule. Flow cytometry notified of 8 am bone marrow.

## 2020-10-16 LAB — MULTIPLE MYELOMA PANEL, SERUM
Albumin SerPl Elph-Mcnc: 3.2 g/dL (ref 2.9–4.4)
Albumin/Glob SerPl: 1 (ref 0.7–1.7)
Alpha 1: 0.3 g/dL (ref 0.0–0.4)
Alpha2 Glob SerPl Elph-Mcnc: 0.7 g/dL (ref 0.4–1.0)
B-Globulin SerPl Elph-Mcnc: 1 g/dL (ref 0.7–1.3)
Gamma Glob SerPl Elph-Mcnc: 1.4 g/dL (ref 0.4–1.8)
Globulin, Total: 3.3 g/dL (ref 2.2–3.9)
IgA: 407 mg/dL — ABNORMAL HIGH (ref 87–352)
IgG (Immunoglobin G), Serum: 958 mg/dL (ref 586–1602)
IgM (Immunoglobulin M), Srm: 648 mg/dL — ABNORMAL HIGH (ref 26–217)
M Protein SerPl Elph-Mcnc: 0.6 g/dL — ABNORMAL HIGH
Total Protein ELP: 6.5 g/dL (ref 6.0–8.5)

## 2020-10-18 ENCOUNTER — Telehealth: Payer: Self-pay | Admitting: Hematology and Oncology

## 2020-10-18 NOTE — Telephone Encounter (Signed)
Scheduled per los. Called and spoke with patient. Confirmed appt 

## 2020-10-24 ENCOUNTER — Other Ambulatory Visit: Payer: Self-pay

## 2020-10-24 ENCOUNTER — Inpatient Hospital Stay: Payer: Medicare Other | Attending: Hematology and Oncology

## 2020-10-24 ENCOUNTER — Inpatient Hospital Stay (HOSPITAL_BASED_OUTPATIENT_CLINIC_OR_DEPARTMENT_OTHER): Payer: Medicare Other | Admitting: Adult Health

## 2020-10-24 VITALS — BP 130/55 | HR 79 | Temp 97.8°F | Resp 18

## 2020-10-24 DIAGNOSIS — D472 Monoclonal gammopathy: Secondary | ICD-10-CM

## 2020-10-24 LAB — CBC WITH DIFFERENTIAL (CANCER CENTER ONLY)
Abs Immature Granulocytes: 0.04 10*3/uL (ref 0.00–0.07)
Basophils Absolute: 0.1 10*3/uL (ref 0.0–0.1)
Basophils Relative: 1 %
Eosinophils Absolute: 0.2 10*3/uL (ref 0.0–0.5)
Eosinophils Relative: 2 %
HCT: 38.3 % (ref 36.0–46.0)
Hemoglobin: 12.6 g/dL (ref 12.0–15.0)
Immature Granulocytes: 0 %
Lymphocytes Relative: 30 %
Lymphs Abs: 3.4 10*3/uL (ref 0.7–4.0)
MCH: 31 pg (ref 26.0–34.0)
MCHC: 32.9 g/dL (ref 30.0–36.0)
MCV: 94.1 fL (ref 80.0–100.0)
Monocytes Absolute: 1.2 10*3/uL — ABNORMAL HIGH (ref 0.1–1.0)
Monocytes Relative: 10 %
Neutro Abs: 6.4 10*3/uL (ref 1.7–7.7)
Neutrophils Relative %: 57 %
Platelet Count: 286 10*3/uL (ref 150–400)
RBC: 4.07 MIL/uL (ref 3.87–5.11)
RDW: 14.9 % (ref 11.5–15.5)
WBC Count: 11.4 10*3/uL — ABNORMAL HIGH (ref 4.0–10.5)
nRBC: 0 % (ref 0.0–0.2)

## 2020-10-24 MED ORDER — LIDOCAINE HCL 2 % IJ SOLN
INTRAMUSCULAR | Status: AC
  Filled 2020-10-24: qty 20

## 2020-10-24 NOTE — Progress Notes (Signed)
Bone Marrow biopsy performed by Wilber Bihari, NP. Pt. Tolerated well. 4x4 dressing applied to site. Beverage and snack provided. No bleeding noted.  Pt. Discharged to home with family after 30 min wait.

## 2020-10-24 NOTE — Progress Notes (Signed)
INDICATION: MGUS  Brief examination was performed. ENT: adequate airway clearance Heart: regular rate and rhythm.No Murmurs Lungs: clear to auscultation, no wheezes, normal respiratory effort  Bone Marrow Biopsy and Aspiration Procedure Note   Informed consent was obtained and potential risks including bleeding, infection and pain were reviewed with the patient.  The patient's name, date of birth, identification, consent and allergies were verified prior to the start of procedure and time out was performed.  The left posterior iliac crest was chosen as the site of biopsy.  The skin was prepped with ChloraPrep.   8 cc of 2% lidocaine was used to provide local anaesthesia.   10 cc of bone marrow aspirate was obtained followed by 1cm biopsy.  Pressure was applied to the biopsy site and bandage was placed over the biopsy site. Patient was made to lie on the back for 30 mins prior to discharge.  The procedure was tolerated well. COMPLICATIONS: None BLOOD LOSS: none The patient was discharged home in stable condition with a follow up to review results.  Patient was provided with post bone marrow biopsy instructions and instructed to call if there was any bleeding or worsening pain.  Specimens sent for flow cytometry, cytogenetics and additional studies.  Signed Scot Dock, NP

## 2020-10-24 NOTE — Patient Instructions (Signed)
Bone Marrow Aspiration and Bone Marrow Biopsy, Adult, Care After This sheet gives you information about how to care for yourself after your procedure. Your health care provider may also give you more specific instructions. If you have problems or questions, contact your health care provider. What can I expect after the procedure? After the procedure, it is common to have:  Mild pain and tenderness.  Swelling.  Bruising. Follow these instructions at home: Puncture site care   Follow instructions from your health care provider about how to take care of the puncture site. Make sure you: ? Wash your hands with soap and water before and after you change your bandage (dressing). If soap and water are not available, use hand sanitizer. ? Change your dressing as told by your health care provider.  Check your puncture site every day for signs of infection. Check for: ? More redness, swelling, or pain. ? Fluid or blood. ? Warmth. ? Pus or a bad smell. Activity  Return to your normal activities as told by your health care provider. Ask your health care provider what activities are safe for you.  Do not lift anything that is heavier than 10 lb (4.5 kg), or the limit that you are told, until your health care provider says that it is safe.  Do not drive for 24 hours if you were given a sedative during your procedure. General instructions   Take over-the-counter and prescription medicines only as told by your health care provider.  Do not take baths, swim, or use a hot tub until your health care provider approves. Ask your health care provider if you may take showers. You may only be allowed to take sponge baths.  If directed, put ice on the affected area. To do this: ? Put ice in a plastic bag. ? Place a towel between your skin and the bag. ? Leave the ice on for 20 minutes, 2-3 times a day.  Keep all follow-up visits as told by your health care provider. This is important. Contact a  health care provider if:  Your pain is not controlled with medicine.  You have a fever.  You have more redness, swelling, or pain around the puncture site.  You have fluid or blood coming from the puncture site.  Your puncture site feels warm to the touch.  You have pus or a bad smell coming from the puncture site. Summary  After the procedure, it is common to have mild pain, tenderness, swelling, and bruising.  Follow instructions from your health care provider about how to take care of the puncture site and what activities are safe for you.  Take over-the-counter and prescription medicines only as told by your health care provider.  Contact a health care provider if you have any signs of infection, such as fluid or blood coming from the puncture site. This information is not intended to replace advice given to you by your health care provider. Make sure you discuss any questions you have with your health care provider. Document Revised: 05/03/2019 Document Reviewed: 05/03/2019 Elsevier Patient Education  2020 [employer]  

## 2020-10-26 ENCOUNTER — Other Ambulatory Visit: Payer: Self-pay | Admitting: *Deleted

## 2020-10-26 DIAGNOSIS — Z79899 Other long term (current) drug therapy: Secondary | ICD-10-CM

## 2020-10-26 NOTE — Progress Notes (Signed)
Office Visit Note  Patient: Abigail Lamb             Date of Birth: 1951/03/24           MRN: [id number]             PCP: Janith Lima, MD Referring: Janith Lima, MD Visit Date: 11/08/2020 Occupation: '@GUAROCC' @  Subjective:  Pain in joints.   History of Present Illness: Abigail Lamb is a 69 y.o. female with severe seropositive erosive end-stage rheumatoid arthritis.  She continues to have pain and discomfort in her bilateral hands and her bilateral feet.  She has tried Humira and Orencia in the past which was discontinued due to frequent infections.  She has been taking methotrexate and Plaquenil on a regular basis.  She added prednisone in July 2021.  She has been taking prednisone 5 mg p.o. daily which helps her with the pain and stiffness.  She is going back to [place] next weekend and will be returning sometimes in January.  She does not want to start any new medications at this point.  She has also not received Covid vaccination yet.  Activities of Daily Living:  Patient reports morning stiffness for 2 hours.   Patient Reports nocturnal pain.  Difficulty dressing/grooming: Reports Difficulty climbing stairs: Reports Difficulty getting out of chair: Denies Difficulty using hands for taps, buttons, cutlery, and/or writing: Reports  Review of Systems  Constitutional: Positive for fatigue.  HENT: Negative for mouth sores, mouth dryness and nose dryness.   Eyes: Positive for dryness.  Respiratory: Negative for shortness of breath and difficulty breathing.   Cardiovascular: Negative for chest pain and palpitations.  Gastrointestinal: Negative for blood in stool, constipation and diarrhea.  Endocrine: Negative for increased urination.  Genitourinary: Negative for difficulty urinating.  Musculoskeletal: Positive for arthralgias, joint pain, joint swelling and morning stiffness. Negative for myalgias, muscle tenderness and myalgias.  Skin: Negative for color change.   Allergic/Immunologic: Positive for susceptible to infections.  Neurological: Positive for dizziness, numbness, headaches and weakness. Negative for memory loss.  Hematological: Positive for bruising/bleeding tendency.  Psychiatric/Behavioral: Negative for confusion. The patient is nervous/anxious.     PMFS History:  Patient Active Problem List   Diagnosis Date Noted  . 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 Routine general medical examination at a health care facility 08/02/2020  . Visit for screening mammogram 08/02/2020  . Rheumatoid arthritis involving multiple sites with positive rheumatoid factor ([place]) 08/02/2020  . Hyperlipidemia LDL goal <130 08/02/2020  . Tobacco abuse 08/02/2020  . Need for pneumococcal vaccination 08/02/2020  . Dupuytren's contracture of right hand 08/02/2020  . Pulmonary emphysema ([place])   . Stage 2 moderate COPD by GOLD classification ([place]) 01/30/2020  . Cigarette smoker 01/30/2020  . Lung nodules 01/30/2020  . 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 Primary insomnia 03/28/2019  . Recurrent depression ([place]) 03/28/2019    Past Medical History:  Diagnosis Date  . Celiac disease   . GERD (gastroesophageal reflux disease)   . MGUS (monoclonal gammopathy of unknown significance)    per patient   . Osteoporosis    per patient, diagnosed by Dr. Shona Simpson in [facility]  . PUD (peptic ulcer disease)   . Pulmonary emphysema ([place])   . Recurrent depression ([place])     Family History  Problem Relation Age of Onset  . CVA Mother   . CAD Mother   . Diabetes Mother   . COPD Mother   . Fibromyalgia Mother   . Depression  Mother   . Hypertension Mother   . Prostate cancer Father   . Rheum arthritis Sister   . Skin cancer Brother   . Kidney Stones Brother   . Hernia Brother   . Depression Daughter   . Healthy Son   . Healthy Son    Past Surgical History:  Procedure Laterality Date  . ABCESS DRAINAGE Left    under left arm  . TONSILLECTOMY  1977   Social History     Social History Narrative  . Not on file   Immunization History  Administered Date(s) Administered  . Influenza, High Dose Seasonal PF 12/30/2019  . Influenza-Unspecified 01/04/2018, 09/11/2020  . Pneumococcal Polysaccharide-23 08/02/2020  . Tdap 11/05/2020     Objective: Vital Signs: BP 117/81 (BP Location: Left Arm, Patient Position: Sitting, Cuff Size: Normal)   Pulse (!) 101   Resp 15   Ht '5\' 2"'  (1.575 m)   Wt 96 lb 6.4 oz (43.7 kg)   BMI 17.63 kg/m    Physical Exam Vitals and nursing note reviewed.  Constitutional:      Appearance: She is well-developed.  HENT:     Head: Normocephalic and atraumatic.  Eyes:     Conjunctiva/sclera: Conjunctivae normal.  Cardiovascular:     Rate and Rhythm: Normal rate and regular rhythm.     Heart sounds: Normal heart sounds.  Pulmonary:     Effort: Pulmonary effort is normal.     Breath sounds: Normal breath sounds.  Abdominal:     General: Bowel sounds are normal.     Palpations: Abdomen is soft.  Musculoskeletal:     Cervical back: Normal range of motion.  Lymphadenopathy:     Cervical: No cervical adenopathy.  Skin:    General: Skin is warm and dry.     Capillary Refill: Capillary refill takes less than 2 seconds.  Neurological:     Mental Status: She is alert and oriented to person, place, and time.  Psychiatric:        Behavior: Behavior normal.      Musculoskeletal Exam: She had good range of motion of her cervical spine.  Shoulder joints and elbow joints with good range of motion.  She has limited range of motion of bilateral wrist joints.  She has synovial thickening and synovitis over MCPs and PIPs.  Subluxation of all MCP joints and ulnar deviation was noted.  Hip joints and knee joints with good range of motion.  She has tenderness on palpation over ankle joints and MTPs.  CDAI Exam: CDAI Score: 23.6  Patient Global: 8 mm; Provider Global: 8 mm Swollen: 9 ; Tender: 20  Joint Exam 11/08/2020      Right   Left  Glenohumeral   Tender   Tender  MCP 2  Swollen Tender  Swollen Tender  MCP 3  Swollen Tender  Swollen Tender  MCP 4  Swollen Tender  Swollen Tender  MCP 5  Swollen Tender  Swollen Tender  PIP 3  Swollen Tender     Knee   Tender   Tender  Ankle   Tender   Tender  MTP 2   Tender     MTP 3   Tender   Tender  MTP 4      Tender  MTP 5   Tender        Investigation: No additional findings.  Imaging: No results found.  Recent Labs: Lab Results  Component Value Date   WBC 13.2 (H) 10/26/2020  HGB 13.4 10/26/2020   PLT 311 10/26/2020   NA 141 10/26/2020   K 3.8 10/26/2020   CL 107 10/26/2020   CO2 23 10/26/2020   GLUCOSE 118 (H) 10/26/2020   BUN 17 10/26/2020   CREATININE 0.62 10/26/2020   BILITOT 0.4 10/26/2020   ALKPHOS 99 10/12/2020   AST 17 10/26/2020   ALT 10 10/26/2020   PROT 6.9 10/26/2020   ALBUMIN 3.2 (L) 10/12/2020   CALCIUM 9.5 10/26/2020   GFRAA 107 10/26/2020   QFTBGOLDPLUS NEGATIVE 09/17/2020   September 17, 2020 IFE-IgM kappa monoclonal protein detected, IgM elevated, TB Gold negative, HIV negative, hepatitis B-, hepatitis C negative, ESR 33, CK 100, RF 851, anti-CCP> 250, ANA negative  Speciality Comments: Humira and Orencia were discontinued in the past due to frequent infections per patient.  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 Severe erosive changes were noted in bilateral hands and bilateral feet x-rays.  Detailed counseling guarding rheumatoid arthritis provided.  She is on the combination of methotrexate, Plaquenil and prednisone for a long time.  She is hesitant to take any Biologics due to frequent infections.  He had brief discussion regarding spacing out the biologic agent to see if  she has less infection.  She would like to think about it.  The option could be to use Orencia less frequently or Enbrel-less frequently in the future.  She is going to [place] and would not be back till February.  We will see her back in February.  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  High risk medication use - MTX 18m sw q, PLQ200 BID M-F and prednisone 548mqd. Humira and Orencia were discontinued in the past due to frequent infections per pt. labs were normal in October.  She is is required to have labs every 3 months and eye examination on the yearly basis.  She states she has not had eye examination this year.  Risk of ocular toxicity was discussed.  Primary osteoarthritis of both knees - Bilateral moderate to severe osteoarthritis and chondromalacia patella.  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 Patient was referred to oncology.  Age-related osteoporosis without current pathological fracture - [place] Discontinued 2016 due to the cost. No other treatment was given. No DEXA available.  I discussed repeating DEXA when she comes back in February.  Stage 2 moderate COPD by GOLD classification ([place] Panlobular emphysema (HCC)  Lung nodules - CT scan showed 4 mm nodule in the right lobe. [facility] Tobacco abuse - Smoking cessation was discussed at the last visit.  Prediabetes  Hyperlipidemia LDL goal <130  Kidney stone - Left-sided nephrolithiasis was noted on the CT chest.  Recurrent depression ([facility] Primary insomnia  Educated about COVID-19 virus infection-she has not received COVID-19 vaccination.  Risk of not getting immunization was discussed.  She plans to get COVID-19 [place] Use of mask, social distancing and hand hygiene was discussed.  Use of monoclonal antibodies were discussed.   Instructions were placed in the AVS.  Orders: No orders of the defined types were placed in this encounter.  No orders of the defined types were  placed in this encounter.    Follow-Up Instructions: Return in about 3 months (around 02/08/2021) for Rheumatoid arthritis.   Bo Merino, MD  Note - This record has been created using Editor, commissioning.  Chart creation errors have been sought, but may not always  have been located. Such creation errors do not reflect on  the standard of medical care.

## 2020-10-27 LAB — CBC WITH DIFFERENTIAL/PLATELET
Absolute Monocytes: 805 cells/uL (ref 200–950)
Basophils Absolute: 66 cells/uL (ref 0–200)
Basophils Relative: 0.5 %
Eosinophils Absolute: 106 cells/uL (ref 15–500)
Eosinophils Relative: 0.8 %
HCT: 40 % (ref 35.0–45.0)
Hemoglobin: 13.4 g/dL (ref 11.7–15.5)
Lymphs Abs: 2204 cells/uL (ref 850–3900)
MCH: 31 pg (ref 27.0–33.0)
MCHC: 33.5 g/dL (ref 32.0–36.0)
MCV: 92.6 fL (ref 80.0–100.0)
MPV: 10.4 fL (ref 7.5–12.5)
Monocytes Relative: 6.1 %
Neutro Abs: 10019 cells/uL — ABNORMAL HIGH (ref 1500–7800)
Neutrophils Relative %: 75.9 %
Platelets: 311 10*3/uL (ref 140–400)
RBC: 4.32 10*6/uL (ref 3.80–5.10)
RDW: 13.6 % (ref 11.0–15.0)
Total Lymphocyte: 16.7 %
WBC: 13.2 10*3/uL — ABNORMAL HIGH (ref 3.8–10.8)

## 2020-10-27 LAB — COMPLETE METABOLIC PANEL WITH GFR
AG Ratio: 1.2 (calc) (ref 1.0–2.5)
ALT: 10 U/L (ref 6–29)
AST: 17 U/L (ref 10–35)
Albumin: 3.8 g/dL (ref 3.6–5.1)
Alkaline phosphatase (APISO): 93 U/L (ref 37–153)
BUN: 17 mg/dL (ref 7–25)
CO2: 23 mmol/L (ref 20–32)
Calcium: 9.5 mg/dL (ref 8.6–10.4)
Chloride: 107 mmol/L (ref 98–110)
Creat: 0.62 mg/dL (ref 0.50–0.99)
GFR, Est African American: 107 mL/min/{1.73_m2} (ref >=60)
GFR, Est Non African American: 93 mL/min/{1.73_m2} (ref >=60)
Globulin: 3.1 g/dL (calc) (ref 1.9–3.7)
Glucose, Bld: 118 mg/dL — ABNORMAL HIGH (ref 65–99)
Potassium: 3.8 mmol/L (ref 3.5–5.3)
Sodium: 141 mmol/L (ref 135–146)
Total Bilirubin: 0.4 mg/dL (ref 0.2–1.2)
Total Protein: 6.9 g/dL (ref 6.1–8.1)

## 2020-10-29 ENCOUNTER — Telehealth: Payer: Self-pay | Admitting: Rheumatology

## 2020-10-29 LAB — SURGICAL PATHOLOGY

## 2020-10-29 MED ORDER — TUBERCULIN SYRINGE 25G X 5/8" 1 ML MISC
3 refills | Status: DC
Start: 2020-10-29 — End: 2021-01-07

## 2020-10-29 NOTE — Progress Notes (Signed)
Glucose is mildly elevated, probably not a fasting blood sample.  WBC count is elevated due to prednisone use.

## 2020-10-29 NOTE — Telephone Encounter (Signed)
Last Visit: 09/17/2020 Next Visit: 11/08/2020  Okay to refill per Dr. Estanislado Pandy

## 2020-10-29 NOTE — Telephone Encounter (Signed)
Patient requesting a rx for syringes for her MTX sent to [facility] on [address].

## 2020-11-02 ENCOUNTER — Other Ambulatory Visit: Payer: Self-pay

## 2020-11-02 ENCOUNTER — Encounter (HOSPITAL_COMMUNITY): Payer: Self-pay | Admitting: Hematology and Oncology

## 2020-11-04 LAB — SURGICAL PATHOLOGY

## 2020-11-05 ENCOUNTER — Encounter: Payer: Self-pay | Admitting: Internal Medicine

## 2020-11-05 ENCOUNTER — Other Ambulatory Visit: Payer: Self-pay | Admitting: *Deleted

## 2020-11-05 ENCOUNTER — Ambulatory Visit (INDEPENDENT_AMBULATORY_CARE_PROVIDER_SITE_OTHER): Payer: Medicare Other

## 2020-11-05 ENCOUNTER — Ambulatory Visit (INDEPENDENT_AMBULATORY_CARE_PROVIDER_SITE_OTHER): Payer: Medicare Other | Admitting: Internal Medicine

## 2020-11-05 ENCOUNTER — Other Ambulatory Visit: Payer: Self-pay

## 2020-11-05 VITALS — BP 118/76 | HR 89 | Temp 98.0°F | Ht 62.0 in | Wt 97.0 lb

## 2020-11-05 DIAGNOSIS — K279 Peptic ulcer, site unspecified, unspecified as acute or chronic, without hemorrhage or perforation: Secondary | ICD-10-CM | POA: Diagnosis not present

## 2020-11-05 DIAGNOSIS — D472 Monoclonal gammopathy: Secondary | ICD-10-CM

## 2020-11-05 DIAGNOSIS — Z23 Encounter for immunization: Secondary | ICD-10-CM | POA: Diagnosis not present

## 2020-11-05 DIAGNOSIS — F339 Major depressive disorder, recurrent, unspecified: Secondary | ICD-10-CM

## 2020-11-05 DIAGNOSIS — L280 Lichen simplex chronicus: Secondary | ICD-10-CM

## 2020-11-05 DIAGNOSIS — F3341 Major depressive disorder, recurrent, in partial remission: Secondary | ICD-10-CM | POA: Diagnosis not present

## 2020-11-05 DIAGNOSIS — Z0000 Encounter for general adult medical examination without abnormal findings: Secondary | ICD-10-CM | POA: Diagnosis not present

## 2020-11-05 MED ORDER — FAMOTIDINE 40 MG PO TABS: 40.0000 mg | ORAL_TABLET | Freq: Two times a day (BID) | ORAL | 1 refills | Status: DC

## 2020-11-05 MED ORDER — FLUOCINONIDE EMULSIFIED BASE 0.05 % EX CREA: 1.0000 "application " | TOPICAL_CREAM | Freq: Two times a day (BID) | CUTANEOUS | 3 refills | Status: AC

## 2020-11-05 NOTE — Patient Instructions (Signed)
Ms. Bedoya , Thank you for taking time to come for your Medicare Wellness Visit. I appreciate your ongoing commitment to your health goals. Please review the following plan we discussed and let me know if I can assist you in the future.   Screening recommendations/referrals: Colonoscopy: last done on 08/01/2015; due every 10 years (2026) Mammogram: last done on 09/05/2020; due every year (2022) Bone Density: never done Recommended yearly ophthalmology/optometry visit for glaucoma screening and checkup Recommended yearly dental visit for hygiene and checkup  Vaccinations: Influenza vaccine: 12/30/2019 Pneumococcal vaccine: 08/02/2020; need Prevnar 13 Tdap vaccine: never done Shingles vaccine: never done   Covid-19: never done  Advanced directives: Advance directive discussed with you today. Even though you declined this today please call our office should you change your mind and we can give you the proper paperwork for you to fill out.  Conditions/risks identified:  Yes; Reviewed health maintenance screenings with patient today and relevant education, vaccines, and/or referrals were provided. Please continue to do your personal lifestyle choices by: daily care of teeth and gums, regular physical activity (goal should be 5 days a week for 30 minutes), eat a healthy diet, avoid tobacco and drug use, limiting any alcohol intake, taking a low-dose aspirin (if not allergic or have been advised by your provider otherwise) and taking vitamins and minerals as recommended by your provider. Continue doing brain stimulating activities (puzzles, reading, adult coloring books, staying active) to keep memory sharp. Continue to eat heart healthy diet (full of fruits, vegetables, whole grains, lean protein, water--limit salt, fat, and sugar intake) and increase physical activity as tolerated.  Next appointment: Please schedule your next Medicare Wellness Visit with your Nurse Health Advisor in 1 year by calling  [phone].  Preventive Care 68 Years and Older, Female Preventive care refers to lifestyle choices and visits with your health care provider that can promote health and wellness. What does preventive care include?  A yearly physical exam. This is also called an annual well check.  Dental exams once or twice a year.  Routine eye exams. Ask your health care provider how often you should have your eyes checked.  Personal lifestyle choices, including:  Daily care of your teeth and gums.  Regular physical activity.  Eating a healthy diet.  Avoiding tobacco and drug use.  Limiting alcohol use.  Practicing safe sex.  Taking low-dose aspirin every day.  Taking vitamin and mineral supplements as recommended by your health care provider. What happens during an annual well check? The services and screenings done by your health care provider during your annual well check will depend on your age, overall health, lifestyle risk factors, and family history of disease. Counseling  Your health care provider may ask you questions about your:  Alcohol use.  Tobacco use.  Drug use.  Emotional well-being.  Home and relationship well-being.  Sexual activity.  Eating habits.  History of falls.  Memory and ability to understand (cognition).  Work and work Statistician.  Reproductive health. Screening  You may have the following tests or measurements:  Height, weight, and BMI.  Blood pressure.  Lipid and cholesterol levels. These may be checked every 5 years, or more frequently if you are over 53 years old.  Skin check.  Lung cancer screening. You may have this screening every year starting at age 68 if you have a 30-pack-year history of smoking and currently smoke or have quit within the past 15 years.  Fecal occult blood test (FOBT) of the stool.  You may have this test every year starting at age 68.  Flexible sigmoidoscopy or colonoscopy. You may have a sigmoidoscopy  every 5 years or a colonoscopy every 10 years starting at age 68.  Hepatitis C blood test.  Hepatitis B blood test.  Sexually transmitted disease (STD) testing.  Diabetes screening. This is done by checking your blood sugar (glucose) after you have not eaten for a while (fasting). You may have this done every 1-3 years.  Bone density scan. This is done to screen for osteoporosis. You may have this done starting at age 68.  Mammogram. This may be done every 1-2 years. Talk to your health care provider about how often you should have regular mammograms. Talk with your health care provider about your test results, treatment options, and if necessary, the need for more tests. Vaccines  Your health care provider may recommend certain vaccines, such as:  Influenza vaccine. This is recommended every year.  Tetanus, diphtheria, and acellular pertussis (Tdap, Td) vaccine. You may need a Td booster every 10 years.  Zoster vaccine. You may need this after age 49.  Pneumococcal 13-valent conjugate (PCV13) vaccine. One dose is recommended after age 4.  Pneumococcal polysaccharide (PPSV23) vaccine. One dose is recommended after age 61. Talk to your health care provider about which screenings and vaccines you need and how often you need them. This information is not intended to replace advice given to you by your health care provider. Make sure you discuss any questions you have with your health care provider. Document Released: 01/11/2016 Document Revised: 09/03/2016 Document Reviewed: 10/16/2015 Elsevier Interactive Patient Education  2017 [place] Prevention in the Home Falls can cause injuries. They can happen to people of all ages. There are many things you can do to make your home safe and to help prevent falls. What can I do on the outside of my home?  Regularly fix the edges of walkways and driveways and fix any cracks.  Remove anything that might make you trip as you walk  through a door, such as a raised step or threshold.  Trim any bushes or trees on the path to your home.  Use bright outdoor lighting.  Clear any walking paths of anything that might make someone trip, such as rocks or tools.  Regularly check to see if handrails are loose or broken. Make sure that both sides of any steps have handrails.  Any raised decks and porches should have guardrails on the edges.  Have any leaves, snow, or ice cleared regularly.  Use sand or salt on walking paths during winter.  Clean up any spills in your garage right away. This includes oil or grease spills. What can I do in the bathroom?  Use night lights.  Install grab bars by the toilet and in the tub and shower. Do not use towel bars as grab bars.  Use non-skid mats or decals in the tub or shower.  If you need to sit down in the shower, use a plastic, non-slip stool.  Keep the floor dry. Clean up any water that spills on the floor as soon as it happens.  Remove soap buildup in the tub or shower regularly.  Attach bath mats securely with double-sided non-slip rug tape.  Do not have throw rugs and other things on the floor that can make you trip. What can I do in the bedroom?  Use night lights.  Make sure that you have a light by your bed that  is easy to reach.  Do not use any sheets or blankets that are too big for your bed. They should not hang down onto the floor.  Have a firm chair that has side arms. You can use this for support while you get dressed.  Do not have throw rugs and other things on the floor that can make you trip. What can I do in the kitchen?  Clean up any spills right away.  Avoid walking on wet floors.  Keep items that you use a lot in easy-to-reach places.  If you need to reach something above you, use a strong step stool that has a grab bar.  Keep electrical cords out of the way.  Do not use floor polish or wax that makes floors slippery. If you must use wax,  use non-skid floor wax.  Do not have throw rugs and other things on the floor that can make you trip. What can I do with my stairs?  Do not leave any items on the stairs.  Make sure that there are handrails on both sides of the stairs and use them. Fix handrails that are broken or loose. Make sure that handrails are as long as the stairways.  Check any carpeting to make sure that it is firmly attached to the stairs. Fix any carpet that is loose or worn.  Avoid having throw rugs at the top or bottom of the stairs. If you do have throw rugs, attach them to the floor with carpet tape.  Make sure that you have a light switch at the top of the stairs and the bottom of the stairs. If you do not have them, ask someone to add them for you. What else can I do to help prevent falls?  Wear shoes that:  Do not have high heels.  Have rubber bottoms.  Are comfortable and fit you well.  Are closed at the toe. Do not wear sandals.  If you use a stepladder:  Make sure that it is fully opened. Do not climb a closed stepladder.  Make sure that both sides of the stepladder are locked into place.  Ask someone to hold it for you, if possible.  Clearly mark and make sure that you can see:  Any grab bars or handrails.  First and last steps.  Where the edge of each step is.  Use tools that help you move around (mobility aids) if they are needed. These include:  Canes.  Walkers.  Scooters.  Crutches.  Turn on the lights when you go into a dark area. Replace any light bulbs as soon as they burn out.  Set up your furniture so you have a clear path. Avoid moving your furniture around.  If any of your floors are uneven, fix them.  If there are any pets around you, be aware of where they are.  Review your medicines with your doctor. Some medicines can make you feel dizzy. This can increase your chance of falling. Ask your doctor what other things that you can do to help prevent  falls. This information is not intended to replace advice given to you by your health care provider. Make sure you discuss any questions you have with your health care provider. Document Released: 10/11/2009 Document Revised: 05/22/2016 Document Reviewed: 01/19/2015 Elsevier Interactive Patient Education  2017 [employer].

## 2020-11-05 NOTE — Progress Notes (Signed)
Subjective:   Mailani Degroote is a 69 y.o. female who presents for an Initial Medicare Annual Wellness Visit.  Review of Systems    No ROS. Medicare Wellness Visit. Additional risk factors are reflected in social history. Cardiac Risk Factors include: advanced age (>28men, >20 women);dyslipidemia;family history of premature cardiovascular disease;smoking/ tobacco exposure Sleep Patterns: Insomnia, sleeps 6 hours nightly. Home Safety/Smoke Alarms: Feels safe in home; uses home alarm. Smoke alarms in place. Living environment: 1-story home; Lives with daughter; no needs for DME; good support system. Seat Belt Safety/Bike Helmet: Wears seat belt.    Objective:    Today's Vitals   11/05/20 1037 11/05/20 1057  BP: 118/76   Pulse: 89   Temp: 98 F (36.7 C)   SpO2: 97%   Weight: 97 lb (44 kg)   Height: 5\' 2"  (1.575 m)   PainSc:  7    Body mass index is 17.74 kg/m.  Advanced Directives 11/05/2020 10/24/2020 10/24/2020  Does Patient Have a Medical Advance Directive? No No No  Would patient like information on creating a medical advance directive? No - Patient declined No - Patient declined No - Patient declined    Current Medications (verified) Outpatient Encounter Medications as of 11/05/2020  Medication Sig  . albuterol (VENTOLIN HFA) 108 (90 Base) MCG/ACT inhaler Take 1-2 puffs every 4-6 hrs prn shortness of breath. Needs ventolin.  . celecoxib (CELEBREX) 50 MG capsule Take 1 capsule (50 mg total) by mouth 2 (two) times daily as needed for pain.  . Cholecalciferol (VITAMIN D3) 125 MCG (5000 UT) CAPS Take 5,000 Units by mouth daily.  . clindamycin (CLEOCIN T) 1 % external solution as needed.   Marland Kitchen co-enzyme Q-10 30 MG capsule Take by mouth.  . colesevelam (WELCHOL) 625 MG tablet Take 3 tablets (1,875 mg total) by mouth 2 (two) times daily with a meal.  . denosumab (PROLIA) 60 MG/ML SOSY injection Inject into the skin.   Marland Kitchen dicyclomine (BENTYL) 10 MG capsule Take 1 capsule (10 mg  total) by mouth 4 (four) times daily -  before meals and at bedtime. (Patient taking differently: Take 10 mg by mouth 4 (four) times daily as needed. )  . DULoxetine (CYMBALTA) 30 MG capsule Take 1 capsule (30 mg total) by mouth every evening.  . DULoxetine (CYMBALTA) 60 MG capsule Take 1 capsule (60 mg total) by mouth daily.  . fluticasone furoate-vilanterol (BREO ELLIPTA) 100-25 MCG/INH AEPB Inhale into the lungs.  . folic acid (FOLVITE) 1 MG tablet Take by mouth.  . hydroxychloroquine (PLAQUENIL) 200 MG tablet TAKE 1 TABLET(200 MG) BY MOUTH TWICE DAILY  . loratadine (CLARITIN) 10 MG tablet Take 10 mg by mouth daily.  . methotrexate 50 MG/2ML injection Inject 1 mL (25 mg total) into the muscle once a week.  . ondansetron (ZOFRAN) 4 MG tablet Take 1 tablet (4 mg total) by mouth every 8 (eight) hours as needed for nausea or vomiting.  . Pitavastatin Calcium (LIVALO) 1 MG TABS Take 1 tablet (1 mg total) by mouth daily.  . predniSONE (DELTASONE) 5 MG tablet Take 1 tablet (5 mg total) by mouth daily with breakfast.  . TUBERCULIN SYR 1CC/25GX5/8" 25G X 5/8" 1 ML MISC Use 1 syringe weekly for Methotrexate injection.  . TURMERIC PO 500 mg by Misc.(Non-Drug; Combo Route) route.  . [DISCONTINUED] clobetasol (OLUX) 0.05 % topical foam as needed.   . [DISCONTINUED] famotidine (PEPCID) 40 MG tablet Take 40 mg by mouth 2 (two) times daily.   No facility-administered  encounter medications on file as of 11/05/2020.    Allergies (verified) Dust mite extract   History: Past Medical History:  Diagnosis Date  . Celiac disease   . GERD (gastroesophageal reflux disease)   . Osteoporosis    per patient, diagnosed by Dr. Shona Simpson in [place]  . PUD (peptic ulcer disease)   . Pulmonary emphysema ([place])   . Recurrent depression ([place])    Past Surgical History:  Procedure Laterality Date  . ABCESS DRAINAGE Left    under left arm  . TONSILLECTOMY  1977   Family History  Problem Relation Age of Onset  . CVA  Mother   . CAD Mother   . Diabetes Mother   . COPD Mother   . Fibromyalgia Mother   . Depression Mother   . Hypertension Mother   . Prostate cancer Father   . Rheum arthritis Sister   . Skin cancer Brother   . Kidney Stones Brother   . Hernia Brother   . Depression Daughter   . Healthy Son   . Healthy Son    Social History   Socioeconomic History  . Marital status: Divorced    Spouse name: Not on file  . Number of children: Not on file  . Years of education: Not on file  . Highest education level: Not on file  Occupational History  . Not on file  Tobacco Use  . Smoking status: Current Every Day Smoker    Packs/day: 0.50    Years: 30.00    Pack years: 15.00    Types: Cigarettes  . Smokeless tobacco: Never Used  Vaping Use  . Vaping Use: Never used  Substance and Sexual Activity  . Alcohol use: Not Currently  . Drug use: Never  . Sexual activity: Not Currently  Other Topics Concern  . Not on file  Social History Narrative  . Not on file   Social Determinants of Health   Financial Resource Strain: Low Risk   . Difficulty of Paying Living Expenses: Not hard at all  Food Insecurity: No Food Insecurity  . Worried About Charity fundraiser in the Last Year: Never true  . Ran Out of Food in the Last Year: Never true  Transportation Needs: No Transportation Needs  . Lack of Transportation (Medical): No  . Lack of Transportation (Non-Medical): No  Physical Activity: Inactive  . Days of Exercise per Week: 0 days  . Minutes of Exercise per Session: 0 min  Stress: No Stress Concern Present  . Feeling of Stress : Not at all  Social Connections: Socially Isolated  . Frequency of Communication with Friends and Family: More than three times a week  . Frequency of Social Gatherings with Friends and Family: Never  . Attends Religious Services: Never  . Active Member of Clubs or Organizations: No  . Attends Archivist Meetings: Never  . 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  Pain Relieving Factors: Pain Meds  BMI - recorded: 17.74 Nutritional Status: BMI <19  Underweight Nutritional Risks: None Diabetes: No  How often do you need to have someone help you when you read instructions, pamphlets, or other written materials from your doctor or pharmacy?: 1 - Never What is the last grade level you completed in school?: HS[employer]; some college  Diabetic? no  Interpreter Needed?: No  Information entered by :: Lisette Abu, LPN   Activities of Daily Living In your present state of health, do you have any difficulty performing the following activities: 11/05/2020  Hearing? N  Vision? N  Difficulty concentrating or making decisions? N  Walking or climbing stairs? N  Dressing or bathing? N  Doing errands, shopping? N  Preparing Food and eating ? N  Using the Toilet? N  In the past six months, have you accidently leaked urine? N  Do you have problems with loss of bowel control? N  Managing your Medications? N  Managing your Finances? N  Housekeeping or managing your Housekeeping? N    Patient Care Team: Janith Lima, MD as PCP - General (Internal Medicine)  Indicate any recent Medical Services you may have received from other than [facility] providers in the past year (date may be  approximate).     Assessment:   This is a routine wellness examination for Adellyn.  Hearing/Vision screen No exam data present  Dietary issues and exercise activities discussed: Current Exercise Habits: The patient does not participate in regular exercise at present, Exercise limited by: Other - see comments;psychological condition(s);respiratory conditions(s) (rheumatoid arthritis)  Goals   None    Depression Screen PHQ 2/9 Scores 11/05/2020 11/05/2020 08/02/2020  PHQ - 2 Score 0 0 0  PHQ- 9 Score 0 0 3    Fall Risk Fall Risk  11/05/2020 08/02/2020  Falls in the past year? 0 0  Number falls in past yr: 0 0  Injury with Fall? 0 0  Risk for fall due to : No Fall Risks No Fall Risks  Follow up Falls evaluation completed Falls evaluation completed    Any stairs in or around the home? No  If so, are there any without handrails? No  Home free of loose throw rugs in walkways, pet beds, electrical cords, etc? Yes  Adequate lighting in your home to reduce risk of falls? Yes   ASSISTIVE DEVICES UTILIZED TO PREVENT FALLS:  Life alert? No  Use of a cane, walker or w/c? No  Grab bars in the bathroom? No  Shower chair or bench in shower? No  Elevated toilet seat or a handicapped toilet? No   TIMED UP AND GO:  Was the test performed? No .  Length of time to ambulate 10 feet: 0 sec.   Gait steady and fast without use of assistive device  Cognitive Function:     6CIT Screen 11/05/2020  What Year? 0 points  What month? 0 points  What time? 0 points  Count back from 20 0 points  Months in reverse 0 points  Repeat phrase 0 points  Total Score 0    Immunizations Immunization History  Administered Date(s) Administered  . Influenza, High Dose Seasonal PF 12/30/2019  . Influenza-Unspecified 01/04/2018  . Pneumococcal Polysaccharide-23 08/02/2020  . Tdap 11/05/2020    TDAP status: Up to date Flu Vaccine status: Up to date Pneumococcal vaccine status: Up to date Covid-19  vaccine status: Declined, Education has been provided regarding the importance of this vaccine but patient still  declined. Advised may receive this vaccine at local pharmacy or Health Dept.or vaccine clinic. Aware to provide a copy of the vaccination record if obtained from local pharmacy or Health Dept. Verbalized acceptance and understanding.  Qualifies for Shingles Vaccine? Yes   Zostavax completed No   Shingrix Completed?: No.    Education has been provided regarding the importance of this vaccine. Patient has been advised to call insurance company to determine out of pocket expense if they have not yet received this vaccine. Advised may also receive vaccine at local pharmacy or Health Dept. Verbalized acceptance and understanding.  Screening Tests Health Maintenance  Topic Date Due  . DEXA SCAN  Never done  . COVID-19 Vaccine (1) 11/21/2020 (Originally 10/13/1963)  . INFLUENZA VACCINE  09/23/2021 (Originally 07/29/2020)  . PNA vac Low Risk Adult (2 of 2 - PCV13) 08/02/2021  . MAMMOGRAM  09/05/2022  . COLONOSCOPY  07/31/2025  . TETANUS/TDAP  11/05/2030  . Hepatitis C Screening  Completed    Health Maintenance  Health Maintenance Due  Topic Date Due  . DEXA SCAN  Never done    Colorectal cancer screening: Completed 08/01/2015. Repeat every 10 years Mammogram status: Completed 09/05/2020. Repeat every year Bone Density status: never done  Lung Cancer Screening: (Low Dose CT Chest recommended if Age 64-80 years, 30 pack-year currently smoking OR have quit w/in 15years.) does qualify.   Lung Cancer Screening Referral: no  Additional Screening:  Hepatitis C Screening: does qualify; Completed yes  Vision Screening: Recommended annual ophthalmology exams for early detection of glaucoma and other disorders of the eye. Is the patient up to date with their annual eye exam?  Yes  Who is the provider or what is the name of the office in which the patient attends annual eye exams? [facility] If pt is not established with a provider, would they like to be referred to a provider to establish care? No .   Dental Screening: Recommended annual dental exams for proper oral hygiene  Community Resource Referral / Chronic Care Management: CRR required this visit?  No   CCM required this visit?  No      Plan:     I have personally reviewed and noted the following in the patient's chart:   . Medical and social history . Use of alcohol, tobacco or illicit drugs  . Current medications and supplements . Functional ability and status . Nutritional status . Physical activity . Advanced directives . List of other physicians . Hospitalizations, surgeries, and ER visits in previous 12 months . Vitals . Screenings to include cognitive, depression, and falls . Referrals and appointments  In addition, I have reviewed and discussed with patient certain preventive protocols, quality metrics, and best practice recommendations. A written personalized care plan for preventive services as well as general preventive health recommendations were provided to patient.     Sheral Flow, LPN   64/05/8031   Nurse Notes: n/a

## 2020-11-05 NOTE — Progress Notes (Signed)
Subjective:  Patient ID: Abigail Lamb, female    DOB: Jul 07, 1951  Age: 69 y.o. MRN: [id number]  CC: Rash  This visit occurred during the SARS-CoV-2 public health emergency.  Safety protocols were in place, including screening questions prior to the visit, additional usage of staff PPE, and extensive cleaning of exam room while observing appropriate contact time as indicated for disinfecting solutions.    HPI Abigail Lamb presents for f/up - She continues to complain of scalp rash and tells me that her insurance will not pay for clobetasol foam.  The rash is pruritic and she feels the urge to scratch the area.  Outpatient Medications Prior to Visit  Medication Sig Dispense Refill   albuterol (VENTOLIN HFA) 108 (90 Base) MCG/ACT inhaler Take 1-2 puffs every 4-6 hrs prn shortness of breath. Needs ventolin.     celecoxib (CELEBREX) 50 MG capsule Take 1 capsule (50 mg total) by mouth 2 (two) times daily as needed for pain. 60 capsule 3   Cholecalciferol (VITAMIN D3) 125 MCG (5000 UT) CAPS Take 5,000 Units by mouth daily.     clindamycin (CLEOCIN T) 1 % external solution as needed.      co-enzyme Q-10 30 MG capsule Take by mouth.     colesevelam (WELCHOL) 625 MG tablet Take 3 tablets (1,875 mg total) by mouth 2 (two) times daily with a meal. 360 tablet 1   denosumab (PROLIA) 60 MG/ML SOSY injection Inject into the skin.      dicyclomine (BENTYL) 10 MG capsule Take 1 capsule (10 mg total) by mouth 4 (four) times daily -  before meals and at bedtime. (Patient taking differently: Take 10 mg by mouth 4 (four) times daily as needed. ) 270 capsule 0   fluticasone furoate-vilanterol (BREO ELLIPTA) 100-25 MCG/INH AEPB Inhale into the lungs.     folic acid (FOLVITE) 1 MG tablet Take by mouth.     hydroxychloroquine (PLAQUENIL) 200 MG tablet TAKE 1 TABLET(200 MG) BY MOUTH TWICE DAILY 180 tablet 0   loratadine (CLARITIN) 10 MG tablet Take 10 mg by mouth daily.     methotrexate 50 MG/2ML  injection Inject 1 mL (25 mg total) into the muscle once a week. 2 mL 0   ondansetron (ZOFRAN) 4 MG tablet Take 1 tablet (4 mg total) by mouth every 8 (eight) hours as needed for nausea or vomiting. 40 tablet 2   Pitavastatin Calcium (LIVALO) 1 MG TABS Take 1 tablet (1 mg total) by mouth daily. 90 tablet 1   predniSONE (DELTASONE) 5 MG tablet Take 1 tablet (5 mg total) by mouth daily with breakfast. 90 tablet 0   TUBERCULIN SYR 1CC/25GX5/8" 25G X 5/8" 1 ML MISC Use 1 syringe weekly for Methotrexate injection. 12 each 3   TURMERIC PO 500 mg by Misc.(Non-Drug; Combo Route) route.     clobetasol (OLUX) 0.05 % topical foam as needed.      DULoxetine (CYMBALTA) 30 MG capsule Take 1 capsule (30 mg total) by mouth every evening. 90 capsule 1   DULoxetine (CYMBALTA) 60 MG capsule Take 1 capsule (60 mg total) by mouth daily. 90 capsule 1   famotidine (PEPCID) 40 MG tablet Take 40 mg by mouth 2 (two) times daily.     No facility-administered medications prior to visit.    ROS Review of Systems  Constitutional: Negative for chills, diaphoresis, fatigue and fever.  HENT: Negative.  Negative for sore throat and trouble swallowing.   Eyes: Negative.   Respiratory: Negative for cough, shortness  of breath and wheezing.   Cardiovascular: Negative for chest pain, palpitations and leg swelling.  Gastrointestinal: Negative for abdominal pain, diarrhea and nausea.  Endocrine: Negative.   Genitourinary: Negative.  Negative for difficulty urinating.  Musculoskeletal: Positive for arthralgias and myalgias. Negative for back pain.  Skin: Positive for rash. Negative for color change, pallor and wound.  Neurological: Negative.  Negative for dizziness.  Hematological: Negative for adenopathy. Does not bruise/bleed easily.  Psychiatric/Behavioral: Positive for dysphoric mood. Negative for decreased concentration, self-injury, sleep disturbance and suicidal ideas. The patient is not nervous/anxious.      Objective:  BP 118/76    Pulse 89    Temp 98 F (36.7 C) (Oral)    Ht 5\' 2"  (1.575 m)    Wt 97 lb (44 kg)    SpO2 97%    BMI 17.74 kg/m   BP Readings from Last 3 Encounters:  11/05/20 118/76  11/05/20 118/76  10/24/20 (!) 130/55    Wt Readings from Last 3 Encounters:  11/05/20 97 lb (44 kg)  11/05/20 97 lb (44 kg)  10/12/20 100 lb 4.8 oz (45.5 kg)    Physical Exam Vitals reviewed.  HENT:     Nose: Nose normal.     Mouth/Throat:     Mouth: Mucous membranes are moist.  Eyes:     General: No scleral icterus.    Conjunctiva/sclera: Conjunctivae normal.  Cardiovascular:     Rate and Rhythm: Normal rate and regular rhythm.     Pulses: Normal pulses.     Heart sounds: No murmur heard.   Pulmonary:     Effort: Pulmonary effort is normal.     Breath sounds: No stridor. No wheezing, rhonchi or rales.  Abdominal:     General: Abdomen is flat. Bowel sounds are normal. There is no distension.     Palpations: Abdomen is soft. There is no hepatomegaly, splenomegaly or mass.     Tenderness: There is no abdominal tenderness.  Musculoskeletal:        General: Normal range of motion.     Cervical back: Neck supple.  Skin:    General: Skin is warm.     Coloration: Skin is not pale.     Findings: Rash present.     Comments: Over the scalp and posterior neck there are coalesced papules with some scaling, a few areas of thick plaque with accentuation of the skin lines.  Neurological:     General: No focal deficit present.     Mental Status: She is alert and oriented to person, place, and time.  Psychiatric:        Mood and Affect: Mood normal.        Behavior: Behavior normal.        Thought Content: Thought content normal.        Judgment: Judgment normal.     Lab Results  Component Value Date   WBC 13.2 (H) 10/26/2020   HGB 13.4 10/26/2020   HCT 40.0 10/26/2020   PLT 311 10/26/2020   GLUCOSE 118 (H) 10/26/2020   CHOL 156 08/02/2020   TRIG 142 08/02/2020   HDL 65  08/02/2020   LDLCALC 68 08/02/2020   ALT 10 10/26/2020   AST 17 10/26/2020   NA 141 10/26/2020   K 3.8 10/26/2020   CL 107 10/26/2020   CREATININE 0.62 10/26/2020   BUN 17 10/26/2020   CO2 23 10/26/2020   TSH 2.73 08/02/2020   HGBA1C 5.5 08/02/2020    MM 3D  SCREEN BREAST BILATERAL  Result Date: 09/05/2020 CLINICAL DATA:  Screening. EXAM: DIGITAL SCREENING BILATERAL MAMMOGRAM WITH TOMO AND CAD COMPARISON:  Previous exam(s). ACR Breast Density Category b: There are scattered areas of fibroglandular density. FINDINGS: There are no findings suspicious for malignancy. Images were processed with CAD. IMPRESSION: No mammographic evidence of malignancy. A result letter of this screening mammogram will be mailed directly to the patient. RECOMMENDATION: Screening mammogram in one year. (Code:SM-B-01Y) BI-RADS CATEGORY  1: Negative. Electronically Signed   By: Audie Pinto M.D.   On: 09/05/2020 08:08    Assessment & Plan:   Iyanni was seen today for rash.  Diagnoses and all orders for this visit:  Recurrent depression ([place])  Lichen simplex chronicus-  Will try fluocinonide cream. -     fluocinonide-emollient (LIDEX-E) 0.05 % cream; Apply 1 application topically 2 (two) times daily.  PUD (peptic ulcer disease)- Her symptoms have improved.  Will continue the H2 blocker. -     famotidine (PEPCID) 40 MG tablet; Take 1 tablet (40 mg total) by mouth 2 (two) times daily.  Recurrent major depressive disorder, in partial remission ([place])- She is doing well on the current dose of duloxetine.  Will continue. -     DULoxetine (CYMBALTA) 30 MG capsule; Take 1 capsule (30 mg total) by mouth every evening. -     DULoxetine (CYMBALTA) 60 MG capsule; Take 1 capsule (60 mg total) by mouth daily.  Other orders -     Tdap vaccine greater than or equal to 7yo IM   I have discontinued Na Grizzell's clobetasol. I have also changed her famotidine. Additionally, I am having her start on  fluocinonide-emollient. Lastly, I am having her maintain her TURMERIC PO, albuterol, Breo Ellipta, denosumab, folic acid, co-enzyme D-78, clindamycin, Livalo, celecoxib, ondansetron, dicyclomine, predniSONE, hydroxychloroquine, colesevelam, methotrexate, Vitamin D3, loratadine, TUBERCULIN SYR 1CC/25GX5/8", DULoxetine, and DULoxetine.  Meds ordered this encounter  Medications   fluocinonide-emollient (LIDEX-E) 0.05 % cream    Sig: Apply 1 application topically 2 (two) times daily.    Dispense:  60 g    Refill:  3   famotidine (PEPCID) 40 MG tablet    Sig: Take 1 tablet (40 mg total) by mouth 2 (two) times daily.    Dispense:  180 tablet    Refill:  1   DULoxetine (CYMBALTA) 30 MG capsule    Sig: Take 1 capsule (30 mg total) by mouth every evening.    Dispense:  90 capsule    Refill:  1   DULoxetine (CYMBALTA) 60 MG capsule    Sig: Take 1 capsule (60 mg total) by mouth daily.    Dispense:  90 capsule    Refill:  1     Follow-up: No follow-ups on file.  Scarlette Calico, MD

## 2020-11-06 ENCOUNTER — Encounter: Payer: Self-pay | Admitting: Internal Medicine

## 2020-11-06 MED ORDER — DULOXETINE HCL 60 MG PO CPEP: 60.0000 mg | ORAL_CAPSULE | Freq: Every day | ORAL | 1 refills | Status: DC

## 2020-11-06 MED ORDER — DULOXETINE HCL 30 MG PO CPEP: 30.0000 mg | ORAL_CAPSULE | Freq: Every evening | ORAL | 1 refills | Status: DC

## 2020-11-07 LAB — UPEP/UIFE/LIGHT CHAINS/TP, 24-HR UR
% BETA, Urine: 29.7 %
ALPHA 1 URINE: 9.2 %
Albumin, U: 26.7 %
Alpha 2, Urine: 10.8 %
Free Kappa Lt Chains,Ur: 72.48 mg/L (ref 0.63–113.79)
Free Kappa/Lambda Ratio: 19.8 (ref 1.03–31.76)
Free Lambda Lt Chains,Ur: 3.66 mg/L (ref 0.47–11.77)
GAMMA GLOBULIN URINE: 23.5 %
Total Protein, Urine-Ur/day: 73 mg/24 hr (ref 30–150)
Total Protein, Urine: 6.1 mg/dL
Total Volume: 1200

## 2020-11-08 ENCOUNTER — Other Ambulatory Visit: Payer: Self-pay

## 2020-11-08 ENCOUNTER — Encounter: Payer: Self-pay | Admitting: Rheumatology

## 2020-11-08 ENCOUNTER — Ambulatory Visit: Payer: Medicare Other | Admitting: Rheumatology

## 2020-11-08 VITALS — BP 117/81 | HR 101 | Resp 15 | Ht 62.0 in | Wt 96.4 lb

## 2020-11-08 DIAGNOSIS — R918 Other nonspecific abnormal finding of lung field: Secondary | ICD-10-CM

## 2020-11-08 DIAGNOSIS — D472 Monoclonal gammopathy: Secondary | ICD-10-CM

## 2020-11-08 DIAGNOSIS — M170 Bilateral primary osteoarthritis of knee: Secondary | ICD-10-CM

## 2020-11-08 DIAGNOSIS — J449 Chronic obstructive pulmonary disease, unspecified: Secondary | ICD-10-CM

## 2020-11-08 DIAGNOSIS — M810 Age-related osteoporosis without current pathological fracture: Secondary | ICD-10-CM

## 2020-11-08 DIAGNOSIS — N200 Calculus of kidney: Secondary | ICD-10-CM

## 2020-11-08 DIAGNOSIS — M0630 Rheumatoid nodule, unspecified site: Secondary | ICD-10-CM | POA: Diagnosis not present

## 2020-11-08 DIAGNOSIS — Z720 Tobacco use: Secondary | ICD-10-CM

## 2020-11-08 DIAGNOSIS — M0579 Rheumatoid arthritis with rheumatoid factor of multiple sites without organ or systems involvement: Secondary | ICD-10-CM

## 2020-11-08 DIAGNOSIS — J431 Panlobular emphysema: Secondary | ICD-10-CM

## 2020-11-08 DIAGNOSIS — F339 Major depressive disorder, recurrent, unspecified: Secondary | ICD-10-CM

## 2020-11-08 DIAGNOSIS — Z7189 Other specified counseling: Secondary | ICD-10-CM

## 2020-11-08 DIAGNOSIS — F5101 Primary insomnia: Secondary | ICD-10-CM

## 2020-11-08 DIAGNOSIS — R7303 Prediabetes: Secondary | ICD-10-CM

## 2020-11-08 DIAGNOSIS — Z79899 Other long term (current) drug therapy: Secondary | ICD-10-CM

## 2020-11-08 DIAGNOSIS — E785 Hyperlipidemia, unspecified: Secondary | ICD-10-CM

## 2020-11-08 NOTE — Patient Instructions (Addendum)
COVID-19 vaccine recommendations:   COVID-19 vaccine is recommended for everyone (unless you are allergic to a vaccine component), even if you are on a medication that suppresses your immune system.   If you are on Methotrexate, Cellcept (mycophenolate), Rinvoq, Morrie Sheldon, and Olumiant- hold the medication for 1 week after each vaccine. Hold Methotrexate for 2 weeks after the single dose COVID-19 vaccine.   Do not take Tylenol or any anti-inflammatory medications (NSAIDs) 24 hours prior to the COVID-19 vaccination.   There is no direct evidence about the efficacy of the COVID-19 vaccine in individuals who are on medications that suppress the immune system.   Even if you are fully vaccinated, and you are on any medications that suppress your immune system, please continue to wear a mask, maintain at least six feet social distance and practice hand hygiene.   If you develop a COVID-19 infection, please contact your PCP or our office to determine if you need monoclonal antibody infusion.  The booster vaccine is now available for immunocompromised patients.   Please see the following web sites for updated information.   [email or web address]   Standing Labs We placed an order today for your standing lab work.   Please have your standing labs drawn in January and every 3 months  If possible, please have your labs drawn 2 weeks prior to your appointment so that the provider can discuss your results at your appointment.  We have open lab daily Monday through Thursday from 8:30-12:30 PM and 1:30-4:30 PM and Friday from 8:30-12:30 PM and 1:30-4:00 PM at the office of Dr. Bo Merino, [facility].   Please be advised, patients with office appointments requiring lab work will take precedents over walk-in lab work.  If possible, please come for your lab work on Monday and Friday afternoons, as you may experience  shorter wait times. The office is located at [address] [place], [place], [place] [address] No appointment is necessary.   Labs are drawn by [facility]. Please bring your co-pay at the time of your lab draw.  You may receive a bill from [facility] for your lab work.  If you wish to have your labs drawn at another location, please call the office 24 hours in advance to send orders.  If you have any questions regarding directions or hours of operation,  please call [phone].   As a reminder, please drink plenty of water prior to coming for your lab work. Thanks!

## 2020-11-09 ENCOUNTER — Other Ambulatory Visit: Payer: Self-pay

## 2020-11-09 ENCOUNTER — Inpatient Hospital Stay: Payer: Medicare Other | Attending: Hematology and Oncology | Admitting: Hematology and Oncology

## 2020-11-09 ENCOUNTER — Inpatient Hospital Stay: Payer: Medicare Other

## 2020-11-09 ENCOUNTER — Encounter: Payer: Self-pay | Admitting: Hematology and Oncology

## 2020-11-09 VITALS — BP 141/80 | HR 99 | Temp 96.8°F | Resp 17 | Ht 62.0 in | Wt 95.4 lb

## 2020-11-09 DIAGNOSIS — D472 Monoclonal gammopathy: Secondary | ICD-10-CM | POA: Diagnosis not present

## 2020-11-09 DIAGNOSIS — F17210 Nicotine dependence, cigarettes, uncomplicated: Secondary | ICD-10-CM | POA: Diagnosis not present

## 2020-11-09 NOTE — Progress Notes (Signed)
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 Found during rheumatologist evaluation for RA.  2) 10/12/2020: establish care with Dr. Lorenso Courier. M protein 0.6, IgM kappa specificity. Kappa 49.3, Lambda 20.3, ratio 2.43.   3) 10/24/2020: Bone marrow biopsy revealed normocellular marrow with polytypic plasmacytosis (5%).   Interval History:  Abigail Lamb 69 y.o. female with medical history significant for IgM kappa monoclonal gammopathy who presents for a follow up visit. The patient's last visit was on 10/12/2020. In the interim since the last visit is undergone extensive testing for this monoclonal gammopathy up to and including bone marrow biopsy which was performed on 10/24/2020.  On exam today Abigail Lamb reports that she has been at her baseline level of health since her last visit.  She reports that she continues to be on steroid therapy 5 mg p.o. daily is working with her rheumatologist towards weaning this off.  She notes that she is had no other major issues in the interim.  She underwent bone marrow biopsy on 10/24/2020 and reports that it was painful, but otherwise did not have any residual soreness, bleeding, or issues.  A full 10 point ROS is listed below.  The bulk of our discussion focused on the results of the bone marrow biopsy and the diagnosis of monoclonal gammopathy of undetermined significance.  MEDICAL HISTORY:  Past Medical History:  Diagnosis Date   Celiac disease    GERD (gastroesophageal reflux disease)    MGUS (monoclonal gammopathy of unknown significance)    per patient    Osteoporosis    per patient, diagnosed by Dr. Shona Simpson in [facility]   PUD (peptic ulcer disease)     Pulmonary emphysema ([place])    Recurrent depression ([place])     SURGICAL HISTORY: Past Surgical History:  Procedure Laterality Date   ABCESS DRAINAGE Left    under left arm   TONSILLECTOMY  1977    SOCIAL HISTORY: Social History   Socioeconomic History   Marital status: Divorced    Spouse name: Not on file   Number of children: Not on file   Years of education: Not on file   Highest education level: Not on file  Occupational History   Not on file  Tobacco Use   Smoking status: Current Every Day Smoker    Packs/day: 0.50    Years: 30.00    Pack years: 15.00    Types: Cigarettes   Smokeless tobacco: Never Used  Scientific laboratory technician Use: Never used  Substance and Sexual Activity   Alcohol use: Not Currently   Drug use: Never   Sexual activity: Not Currently  Other Topics Concern   Not on file  Social History Narrative   Not on file   Social Determinants of Health   Financial Resource Strain: Low Risk    Difficulty of Paying Living Expenses: Not hard at all  Food Insecurity: No Food Insecurity   Worried About Charity fundraiser in the Last Year: Never true   [place] in the Last Year: Never true  Transportation Needs: No Transportation Needs   Lack of Transportation (Medical): No   Lack of Transportation (Non-Medical): No  Physical Activity: Inactive   Days of Exercise per Week: 0 days  Minutes of Exercise per Session: 0 min  Stress: No Stress Concern Present   Feeling of Stress : Not at all  Social Connections: Socially Isolated   Frequency of Communication with Friends and Family: More than three times a week   Frequency of Social Gatherings with Friends and Family: Never   Attends Religious Services: Never   Marine scientist or Organizations: No   Attends Music therapist: Never   Marital Status: Never married  Human resources officer Violence:    Fear of Current or Ex-Partner: Not on file   Emotionally  Abused: Not on file   Physically Abused: Not on file   Sexually Abused: Not on file    FAMILY HISTORY: Family History  Problem Relation Age of Onset   CVA Mother    CAD Mother    Diabetes Mother    COPD Mother    Fibromyalgia Mother    Depression Mother    Hypertension Mother    Prostate cancer Father    Rheum arthritis Sister    Skin cancer Brother    Kidney Stones Brother    Hernia Brother    Depression Daughter    Healthy Son    Healthy Son     ALLERGIES:  is allergic to dust mite extract.  MEDICATIONS:  Current Outpatient Medications  Medication Sig Dispense Refill   albuterol (VENTOLIN HFA) 108 (90 Base) MCG/ACT inhaler Take 1-2 puffs every 4-6 hrs prn shortness of breath. Needs ventolin.     Cholecalciferol (VITAMIN D3) 125 MCG (5000 UT) CAPS Take 5,000 Units by mouth daily.     clindamycin (CLEOCIN T) 1 % external solution as needed.      co-enzyme Q-10 30 MG capsule Take by mouth.     colesevelam (WELCHOL) 625 MG tablet Take 3 tablets (1,875 mg total) by mouth 2 (two) times daily with a meal. 360 tablet 1   dicyclomine (BENTYL) 10 MG capsule Take 1 capsule (10 mg total) by mouth 4 (four) times daily -  before meals and at bedtime. (Patient taking differently: Take 10 mg by mouth 4 (four) times daily as needed. ) 270 capsule 0   DULoxetine (CYMBALTA) 30 MG capsule Take 1 capsule (30 mg total) by mouth every evening. 90 capsule 1   DULoxetine (CYMBALTA) 60 MG capsule Take 1 capsule (60 mg total) by mouth daily. 90 capsule 1   famotidine (PEPCID) 40 MG tablet Take 1 tablet (40 mg total) by mouth 2 (two) times daily. 180 tablet 1   fluocinonide-emollient (LIDEX-E) 0.05 % cream Apply 1 application topically 2 (two) times daily. 60 g 3   fluticasone furoate-vilanterol (BREO ELLIPTA) 100-25 MCG/INH AEPB Inhale into the lungs.     folic acid (FOLVITE) 1 MG tablet Take by mouth.     hydroxychloroquine (PLAQUENIL) 200 MG tablet TAKE 1 TABLET(200  MG) BY MOUTH TWICE DAILY 180 tablet 0   loratadine (CLARITIN) 10 MG tablet Take 10 mg by mouth daily.     methotrexate 50 MG/2ML injection Inject 1 mL (25 mg total) into the muscle once a week. 2 mL 0   ondansetron (ZOFRAN) 4 MG tablet Take 1 tablet (4 mg total) by mouth every 8 (eight) hours as needed for nausea or vomiting. 40 tablet 2   Pitavastatin Calcium (LIVALO) 1 MG TABS Take 1 tablet (1 mg total) by mouth daily. 90 tablet 1   predniSONE (DELTASONE) 5 MG tablet Take 1 tablet (5 mg total) by mouth daily with breakfast. 90 tablet  0   TUBERCULIN SYR 1CC/25GX5/8" 25G X 5/8" 1 ML MISC Use 1 syringe weekly for Methotrexate injection. 12 each 3   TURMERIC PO 500 mg by Misc.(Non-Drug; Combo Route) route.     No current facility-administered medications for this visit.    REVIEW OF SYSTEMS:   Constitutional: ( - ) fevers, ( - )  chills , ( - ) night sweats Eyes: ( - ) blurriness of vision, ( - ) double vision, ( - ) watery eyes Ears, nose, mouth, throat, and face: ( - ) mucositis, ( - ) sore throat Respiratory: ( - ) cough, ( - ) dyspnea, ( - ) wheezes Cardiovascular: ( - ) palpitation, ( - ) chest discomfort, ( - ) lower extremity swelling Gastrointestinal:  ( - ) nausea, ( - ) heartburn, ( - ) change in bowel habits Skin: ( - ) abnormal skin rashes Lymphatics: ( - ) new lymphadenopathy, ( - ) easy bruising Neurological: ( - ) numbness, ( - ) tingling, ( - ) new weaknesses Behavioral/Psych: ( - ) mood change, ( - ) new changes  All other systems were reviewed with the patient and are negative.  PHYSICAL EXAMINATION: ECOG PERFORMANCE STATUS: 1 - Symptomatic but completely ambulatory  Vitals:   11/09/20 0954  BP: (!) 141/80  Pulse: 99  Resp: 17  Temp: (!) 96.8 F (36 C)  SpO2: 99%   Filed Weights   11/09/20 0954  Weight: 95 lb 6.4 oz (43.3 kg)    GENERAL: well appearing elderly Caucasian female in NAD  SKIN: skin color, texture, turgor are normal, no rashes or  significant lesions EYES: conjunctiva are pink and non-injected, sclera clear LUNGS: clear to auscultation and percussion with normal breathing effort HEART: regular rate & rhythm and no murmurs and no lower extremity edema Musculoskeletal: no cyanosis of digits and no clubbing. Ulnar deviation of fingers bilaterally   PSYCH: alert & oriented x 3, fluent speech NEURO: no focal motor/sensory deficits  LABORATORY DATA:  I have reviewed the data as listed CBC Latest Ref Rng & Units 10/26/2020 10/24/2020 10/12/2020  WBC 3.8 - 10.8 Thousand/uL 13.2(H) 11.4(H) 13.8(H)  Hemoglobin 11.7 - 15.5 g/dL 13.4 12.6 12.3  Hematocrit 35 - 45 % 40.0 38.3 37.9  Platelets 140 - 400 Thousand/uL 311 286 310    CMP Latest Ref Rng & Units 10/26/2020 10/12/2020 09/17/2020  Glucose 65 - 99 mg/dL 118(H) 84 -  BUN 7 - 25 mg/dL 17 14 -  Creatinine 0.50 - 0.99 mg/dL 0.62 0.70 -  Sodium 135 - 146 mmol/L 141 139 -  Potassium 3.5 - 5.3 mmol/L 3.8 4.0 -  Chloride 98 - 110 mmol/L 107 108 -  CO2 20 - 32 mmol/L 23 28 -  Calcium 8.6 - 10.4 mg/dL 9.5 9.4 -  Total Protein 6.1 - 8.1 g/dL 6.9 7.1 7.1  Total Bilirubin 0.2 - 1.2 mg/dL 0.4 0.2(L) -  Alkaline Phos 38 - 126 U/L - 99 -  AST 10 - 35 U/L 17 16 -  ALT 6 - 29 U/L 10 10 -    Lab Results  Component Value Date   MPROTEIN 0.6 (H) 10/12/2020   Lab Results  Component Value Date   KPAFRELGTCHN 49.3 (H) 10/12/2020   LAMBDASER 20.3 10/12/2020   KAPLAMBRATIO 19.80 11/05/2020   KAPLAMBRATIO 2.43 (H) 10/12/2020    RADIOGRAPHIC STUDIES: No results found.  ASSESSMENT & PLAN Abigail Lamb 69 y.o. female with medical history significant for IgM kappa monoclonal gammopathy who presents  for a follow up visit.  After review the labs, review the records, discussion the patient the findings are most consistent with a monoclonal myopathy of undetermined significance.  She underwent bone marrow biopsy which did show a 5% plasma cell population, however this was polytypic in  nature.  As such the patient does not require any treatment but only observation from this point moving forward.  The only missing piece of her work-up at this time is her metastatic survey which she requested be performed after December 29, 2020.  Overall she is at her baseline level of health at this time with no other issues that need addressed.  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 We will require the metastatic survey before we can make that determination with certainty --labs ordered in last 2 weeks, no need for repeat labs today --discuss with the patient the nature of MGUS and the follow up that will be required. --RTC in 3 months time. If MGUS labs are stable can extend this to q 6 months.   No orders of the defined types were placed in this encounter.   All questions were answered. The patient knows to call the clinic with any problems, questions or concerns.  A total of more than 30 minutes were spent on this encounter and over half of that time was spent on counseling and coordination of care as outlined above.   Ledell Peoples, MD Department of Hematology/Oncology [place] [facility] Phone: [phone] Pager: [phone] Email: [email or web address]  11/09/2020 11:16 AM    Literature Support:  1) Kyle RA, Durie BG, Rajkumar SV, et al. Monoclonal gammopathy of undetermined significance (MGUS) and smoldering (asymptomatic) multiple myeloma: IMWG consensus perspectives risk factors for progression and guidelines for monitoring and management. Leukemia. 2010;24(6):1121-1127. doi:10.1038/leu.2010.60   --If a patient with apparent MGUS has a serum monoclonal protein >15 g/l, IgA or IgM protein type, or an abnormal FLC ratio, a BM aspirate and biopsy should be carried out at baseline to rule out underlying PC malignancy.   2) Marylyn Ishihara RA, Therneau TM, Rajkumar SV, et al. Prevalence  of monoclonal gammopathy of undetermined significance. N Engl J Med 4967;591:6384-6659   --MGUS was found in 3.2 percent of persons 19 years of age or older and 5.3 percent of persons 28 years of age or older.

## 2020-11-12 ENCOUNTER — Encounter: Payer: Self-pay | Admitting: Rheumatology

## 2020-11-12 NOTE — Telephone Encounter (Signed)
She would benefit from B12 injections only if she has vitamin B12 deficiency.  I do not see that her vitamin B12 has been checked.  Please advise the patient to discuss with PCP to see if she needs her vitamin B12 level checked.

## 2020-11-13 ENCOUNTER — Telehealth: Payer: Self-pay

## 2020-11-13 NOTE — Telephone Encounter (Signed)
Patient left a voicemail stating she was returning Andrea's call.

## 2020-11-13 NOTE — Telephone Encounter (Signed)
I called patient, patient will contact PCP if she wants B12 checked.

## 2020-11-13 NOTE — Telephone Encounter (Signed)
I called patient, patient verbalized understanding. 

## 2020-12-12 ENCOUNTER — Telehealth: Payer: Self-pay | Admitting: Internal Medicine

## 2020-12-12 NOTE — Telephone Encounter (Signed)
    Patient requesting refill for methotrexate 50 MG/2ML injection Pharmacy: [facility], [place], [place] [address]

## 2020-12-14 ENCOUNTER — Other Ambulatory Visit: Payer: Self-pay | Admitting: Internal Medicine

## 2020-12-14 DIAGNOSIS — M0579 Rheumatoid arthritis with rheumatoid factor of multiple sites without organ or systems involvement: Secondary | ICD-10-CM

## 2020-12-14 MED ORDER — METHOTREXATE SODIUM CHEMO INJECTION 50 MG/2ML: 25.0000 mg | INTRAMUSCULAR | 0 refills | Status: DC

## 2020-12-14 NOTE — Telephone Encounter (Signed)
RX sent

## 2021-01-07 ENCOUNTER — Telehealth: Payer: Self-pay

## 2021-01-07 ENCOUNTER — Other Ambulatory Visit: Payer: Self-pay | Admitting: *Deleted

## 2021-01-07 DIAGNOSIS — M0579 Rheumatoid arthritis with rheumatoid factor of multiple sites without organ or systems involvement: Secondary | ICD-10-CM

## 2021-01-07 MED ORDER — "TUBERCULIN SYRINGE 25G X 5/8"" 1 ML MISC": 3 refills | Status: DC

## 2021-01-07 MED ORDER — METHOTREXATE SODIUM CHEMO INJECTION 50 MG/2ML: 25.0000 mg | INTRAMUSCULAR | 0 refills | Status: DC

## 2021-01-07 NOTE — Telephone Encounter (Signed)
Patient called requesting prescription refill of Methotrexate and syringes to be sent to [facility] [address] in [place], [place].  Patient is in [place] helping her son move.

## 2021-01-07 NOTE — Telephone Encounter (Signed)
Last Visit: 11/08/2020 Next Visit: 02/07/2021 Labs: 10/26/2020, Glucose is mildly elevated, probably not a fasting blood sample. WBC count is elevated due to prednisone use.  Current Dose per office note 11/08/2020,  MTX 70ml sw q, DX: Rheumatoid arthritis with rheumatoid factor of multiple sites without organ or systems involvement   Okay to refill MTX and Syringes?

## 2021-01-10 ENCOUNTER — Ambulatory Visit: Payer: Medicare Other | Admitting: Rheumatology

## 2021-01-25 NOTE — Progress Notes (Deleted)
Office Visit Note  Patient: Abigail Lamb             Date of Birth: [birth year]           MRN: [id number]             PCP: Janith Lima, MD Referring: Janith Lima, MD Visit Date: 02/07/2021 Occupation: @GUAROCC @  Subjective:  No chief complaint on file.   History of Present Illness: Abigail Lamb is a 70 y.o. female ***   Activities of Daily Living:  Patient reports morning stiffness for *** {minute/hour:19697}.   Patient {ACTIONS;DENIES/REPORTS:21021675::"Denies"} nocturnal pain.  Difficulty dressing/grooming: {ACTIONS;DENIES/REPORTS:21021675::"Denies"} Difficulty climbing stairs: {ACTIONS;DENIES/REPORTS:21021675::"Denies"} Difficulty getting out of chair: {ACTIONS;DENIES/REPORTS:21021675::"Denies"} Difficulty using hands for taps, buttons, cutlery, and/or writing: {ACTIONS;DENIES/REPORTS:21021675::"Denies"}  No Rheumatology ROS completed.   PMFS History:  Patient Active Problem List   Diagnosis Date Noted  . 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 Routine general medical examination at a health care facility 08/02/2020  . Visit for screening mammogram 08/02/2020  . Rheumatoid arthritis involving multiple sites with positive rheumatoid factor ([place]) 08/02/2020  . Hyperlipidemia LDL goal <130 08/02/2020  . Tobacco abuse 08/02/2020  . Need for pneumococcal vaccination 08/02/2020  . Dupuytren's contracture of right hand 08/02/2020  . Pulmonary emphysema ([place])   . Stage 2 moderate COPD by GOLD classification ([place]) 01/30/2020  . Cigarette smoker 01/30/2020  . Lung nodules 01/30/2020  . 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 Primary insomnia 03/28/2019  . Recurrent depression ([place]) 03/28/2019    Past Medical History:  Diagnosis Date  . Celiac disease   . GERD (gastroesophageal reflux disease)   . MGUS (monoclonal gammopathy of unknown significance)    per patient   . Osteoporosis    per patient, diagnosed by Dr. Shona Simpson in [facility]  . PUD (peptic ulcer disease)   .  Pulmonary emphysema ([place])   . Recurrent depression ([place])     Family History  Problem Relation Age of Onset  . CVA Mother   . CAD Mother   . Diabetes Mother   . COPD Mother   . Fibromyalgia Mother   . Depression Mother   . Hypertension Mother   . Prostate cancer Father   . Rheum arthritis Sister   . Skin cancer Brother   . Kidney Stones Brother   . Hernia Brother   . Depression Daughter   . Healthy Son   . Healthy Son    Past Surgical History:  Procedure Laterality Date  . ABCESS DRAINAGE Left    under left arm  . TONSILLECTOMY  1977   Social History   Social History Narrative  . Not on file   Immunization History  Administered Date(s) Administered  . Influenza, High Dose Seasonal PF 12/30/2019  . Influenza-Unspecified 01/04/2018, 09/11/2020  . Pneumococcal Polysaccharide-23 08/02/2020  . Tdap 11/05/2020     Objective: Vital Signs: There were no vitals taken for this visit.   Physical Exam   Musculoskeletal Exam: ***  CDAI Exam: CDAI Score: - Patient Global: -; Provider Global: - Swollen: -; Tender: - Joint Exam 02/07/2021   No joint exam has been documented for this visit   There is currently no information documented on the homunculus. Go to the Rheumatology activity and complete the homunculus joint exam.  Investigation: No additional findings.  Imaging: No results found.  Recent Labs: Lab Results  Component Value Date   WBC 13.2 (H) 10/26/2020   HGB 13.4 10/26/2020   PLT 311 10/26/2020   NA 141 10/26/2020  K 3.8 10/26/2020   CL 107 10/26/2020   CO2 23 10/26/2020   GLUCOSE 118 (H) 10/26/2020   BUN 17 10/26/2020   CREATININE 0.62 10/26/2020   BILITOT 0.4 10/26/2020   ALKPHOS 99 10/12/2020   AST 17 10/26/2020   ALT 10 10/26/2020   PROT 6.9 10/26/2020   ALBUMIN 3.2 (L) 10/12/2020   CALCIUM 9.5 10/26/2020   GFRAA 107 10/26/2020   QFTBGOLDPLUS NEGATIVE 09/17/2020    Speciality Comments: Humira and Orencia were discontinued in the  past due to frequent infections per patient.  Procedures:  No procedures performed Allergies: Dust mite extract   Assessment / Plan:     Visit Diagnoses: No diagnosis found.  Orders: No orders of the defined types were placed in this encounter.  No orders of the defined types were placed in this encounter.   Face-to-face time spent with patient was *** minutes. Greater than 50% of time was spent in counseling and coordination of care.  Follow-Up Instructions: No follow-ups on file.   Earnestine Mealing, CMA  Note - This record has been created using Editor, commissioning.  Chart creation errors have been sought, but may not always  have been located. Such creation errors do not reflect on  the standard of medical care.

## 2021-01-28 ENCOUNTER — Ambulatory Visit (HOSPITAL_COMMUNITY): Payer: Medicare Other

## 2021-01-31 ENCOUNTER — Other Ambulatory Visit: Payer: Self-pay | Admitting: Internal Medicine

## 2021-01-31 DIAGNOSIS — R918 Other nonspecific abnormal finding of lung field: Secondary | ICD-10-CM

## 2021-02-01 ENCOUNTER — Telehealth: Payer: Self-pay | Admitting: Internal Medicine

## 2021-02-01 ENCOUNTER — Ambulatory Visit: Payer: Medicare Other | Admitting: Rheumatology

## 2021-02-01 NOTE — Telephone Encounter (Signed)
FYI: I called pt regarding the CT scan order and she wanted me to let you know she fractured her foot in [place].

## 2021-02-07 ENCOUNTER — Ambulatory Visit: Payer: Medicare Other | Admitting: Rheumatology

## 2021-02-07 DIAGNOSIS — M170 Bilateral primary osteoarthritis of knee: Secondary | ICD-10-CM

## 2021-02-07 DIAGNOSIS — N200 Calculus of kidney: Secondary | ICD-10-CM

## 2021-02-07 DIAGNOSIS — J431 Panlobular emphysema: Secondary | ICD-10-CM

## 2021-02-07 DIAGNOSIS — F339 Major depressive disorder, recurrent, unspecified: Secondary | ICD-10-CM

## 2021-02-07 DIAGNOSIS — M810 Age-related osteoporosis without current pathological fracture: Secondary | ICD-10-CM

## 2021-02-07 DIAGNOSIS — M0579 Rheumatoid arthritis with rheumatoid factor of multiple sites without organ or systems involvement: Secondary | ICD-10-CM

## 2021-02-07 DIAGNOSIS — J449 Chronic obstructive pulmonary disease, unspecified: Secondary | ICD-10-CM

## 2021-02-07 DIAGNOSIS — E785 Hyperlipidemia, unspecified: Secondary | ICD-10-CM

## 2021-02-07 DIAGNOSIS — F5101 Primary insomnia: Secondary | ICD-10-CM

## 2021-02-07 DIAGNOSIS — Z79899 Other long term (current) drug therapy: Secondary | ICD-10-CM

## 2021-02-07 DIAGNOSIS — R7303 Prediabetes: Secondary | ICD-10-CM

## 2021-02-07 DIAGNOSIS — D472 Monoclonal gammopathy: Secondary | ICD-10-CM

## 2021-02-07 DIAGNOSIS — M0630 Rheumatoid nodule, unspecified site: Secondary | ICD-10-CM

## 2021-02-07 DIAGNOSIS — R918 Other nonspecific abnormal finding of lung field: Secondary | ICD-10-CM

## 2021-02-07 DIAGNOSIS — Z720 Tobacco use: Secondary | ICD-10-CM

## 2021-02-08 ENCOUNTER — Inpatient Hospital Stay: Payer: Medicare Other | Admitting: Hematology and Oncology

## 2021-02-08 ENCOUNTER — Inpatient Hospital Stay: Payer: Medicare Other

## 2021-02-28 ENCOUNTER — Other Ambulatory Visit: Payer: Self-pay | Admitting: Internal Medicine

## 2021-02-28 ENCOUNTER — Other Ambulatory Visit: Payer: Self-pay | Admitting: Family Medicine

## 2021-02-28 DIAGNOSIS — M0579 Rheumatoid arthritis with rheumatoid factor of multiple sites without organ or systems involvement: Secondary | ICD-10-CM

## 2021-03-12 ENCOUNTER — Other Ambulatory Visit: Payer: Self-pay | Admitting: Internal Medicine

## 2021-03-12 DIAGNOSIS — M0579 Rheumatoid arthritis with rheumatoid factor of multiple sites without organ or systems involvement: Secondary | ICD-10-CM

## 2021-03-16 ENCOUNTER — Other Ambulatory Visit: Payer: Self-pay | Admitting: Internal Medicine

## 2021-03-16 DIAGNOSIS — E785 Hyperlipidemia, unspecified: Secondary | ICD-10-CM

## 2021-06-16 ENCOUNTER — Other Ambulatory Visit: Payer: Self-pay | Admitting: Internal Medicine

## 2021-06-16 DIAGNOSIS — M0579 Rheumatoid arthritis with rheumatoid factor of multiple sites without organ or systems involvement: Secondary | ICD-10-CM

## 2021-06-16 DIAGNOSIS — K3184 Gastroparesis: Secondary | ICD-10-CM

## 2021-06-21 ENCOUNTER — Other Ambulatory Visit: Payer: Self-pay | Admitting: Internal Medicine

## 2021-06-21 DIAGNOSIS — F3341 Major depressive disorder, recurrent, in partial remission: Secondary | ICD-10-CM

## 2021-06-26 ENCOUNTER — Other Ambulatory Visit: Payer: Self-pay | Admitting: Internal Medicine

## 2021-06-26 DIAGNOSIS — M0579 Rheumatoid arthritis with rheumatoid factor of multiple sites without organ or systems involvement: Secondary | ICD-10-CM

## 2021-08-05 ENCOUNTER — Other Ambulatory Visit: Payer: Self-pay | Admitting: Internal Medicine

## 2021-08-05 DIAGNOSIS — K279 Peptic ulcer, site unspecified, unspecified as acute or chronic, without hemorrhage or perforation: Secondary | ICD-10-CM

## 2021-09-18 ENCOUNTER — Other Ambulatory Visit: Payer: Self-pay | Admitting: Internal Medicine

## 2021-09-18 DIAGNOSIS — F3341 Major depressive disorder, recurrent, in partial remission: Secondary | ICD-10-CM

## 2021-10-28 ENCOUNTER — Telehealth: Payer: Self-pay | Admitting: Rheumatology

## 2021-10-31 NOTE — Telephone Encounter (Signed)
Opened in error

## 2021-11-07 ENCOUNTER — Ambulatory Visit (INDEPENDENT_AMBULATORY_CARE_PROVIDER_SITE_OTHER): Payer: Medicare Other | Admitting: Internal Medicine

## 2021-11-07 ENCOUNTER — Encounter: Payer: Self-pay | Admitting: Internal Medicine

## 2021-11-07 ENCOUNTER — Other Ambulatory Visit: Payer: Self-pay

## 2021-11-07 ENCOUNTER — Ambulatory Visit (INDEPENDENT_AMBULATORY_CARE_PROVIDER_SITE_OTHER): Payer: Medicare Other

## 2021-11-07 VITALS — BP 122/70 | HR 106 | Temp 98.3°F | Ht 62.0 in | Wt 73.0 lb

## 2021-11-07 DIAGNOSIS — K3184 Gastroparesis: Secondary | ICD-10-CM | POA: Diagnosis not present

## 2021-11-07 DIAGNOSIS — Z0001 Encounter for general adult medical examination with abnormal findings: Secondary | ICD-10-CM

## 2021-11-07 DIAGNOSIS — F339 Major depressive disorder, recurrent, unspecified: Secondary | ICD-10-CM

## 2021-11-07 DIAGNOSIS — K582 Mixed irritable bowel syndrome: Secondary | ICD-10-CM | POA: Diagnosis not present

## 2021-11-07 DIAGNOSIS — D72829 Elevated white blood cell count, unspecified: Secondary | ICD-10-CM

## 2021-11-07 DIAGNOSIS — R053 Chronic cough: Secondary | ICD-10-CM

## 2021-11-07 DIAGNOSIS — E785 Hyperlipidemia, unspecified: Secondary | ICD-10-CM | POA: Diagnosis not present

## 2021-11-07 DIAGNOSIS — Z23 Encounter for immunization: Secondary | ICD-10-CM

## 2021-11-07 DIAGNOSIS — R000 Tachycardia, unspecified: Secondary | ICD-10-CM

## 2021-11-07 DIAGNOSIS — Z720 Tobacco use: Secondary | ICD-10-CM

## 2021-11-07 DIAGNOSIS — J449 Chronic obstructive pulmonary disease, unspecified: Secondary | ICD-10-CM

## 2021-11-07 DIAGNOSIS — K589 Irritable bowel syndrome without diarrhea: Secondary | ICD-10-CM | POA: Insufficient documentation

## 2021-11-07 DIAGNOSIS — L280 Lichen simplex chronicus: Secondary | ICD-10-CM

## 2021-11-07 DIAGNOSIS — Z1231 Encounter for screening mammogram for malignant neoplasm of breast: Secondary | ICD-10-CM

## 2021-11-07 DIAGNOSIS — R64 Cachexia: Secondary | ICD-10-CM

## 2021-11-07 LAB — HEPATIC FUNCTION PANEL
ALT: 8 U/L (ref 0–35)
AST: 18 U/L (ref 0–37)
Albumin: 3.7 g/dL (ref 3.5–5.2)
Alkaline Phosphatase: 108 U/L (ref 39–117)
Bilirubin, Direct: 0 mg/dL (ref 0.0–0.3)
Total Bilirubin: 0.4 mg/dL (ref 0.2–1.2)
Total Protein: 8.2 g/dL (ref 6.0–8.3)

## 2021-11-07 LAB — CBC WITH DIFFERENTIAL/PLATELET
Basophils Absolute: 0.1 10*3/uL (ref 0.0–0.1)
Basophils Relative: 0.7 % (ref 0.0–3.0)
Eosinophils Absolute: 0.1 10*3/uL (ref 0.0–0.7)
Eosinophils Relative: 0.9 % (ref 0.0–5.0)
HCT: 38.2 % (ref 36.0–46.0)
Hemoglobin: 12.8 g/dL (ref 12.0–15.0)
Lymphocytes Relative: 25 % (ref 12.0–46.0)
Lymphs Abs: 3.2 10*3/uL (ref 0.7–4.0)
MCHC: 33.5 g/dL (ref 30.0–36.0)
MCV: 90 fl (ref 78.0–100.0)
Monocytes Absolute: 1 10*3/uL (ref 0.1–1.0)
Monocytes Relative: 7.6 % (ref 3.0–12.0)
Neutro Abs: 8.3 10*3/uL — ABNORMAL HIGH (ref 1.4–7.7)
Neutrophils Relative %: 65.8 % (ref 43.0–77.0)
Platelets: 485 10*3/uL — ABNORMAL HIGH (ref 150.0–400.0)
RBC: 4.25 Mil/uL (ref 3.87–5.11)
RDW: 14.6 % (ref 11.5–15.5)
WBC: 12.6 10*3/uL — ABNORMAL HIGH (ref 4.0–10.5)

## 2021-11-07 LAB — BASIC METABOLIC PANEL
BUN: 22 mg/dL (ref 6–23)
CO2: 26 mEq/L (ref 19–32)
Calcium: 9.7 mg/dL (ref 8.4–10.5)
Chloride: 101 mEq/L (ref 96–112)
Creatinine, Ser: 0.65 mg/dL (ref 0.40–1.20)
GFR: 90.06 mL/min (ref >60.00)
Glucose, Bld: 94 mg/dL (ref 70–99)
Potassium: 4 mEq/L (ref 3.5–5.1)
Sodium: 135 mEq/L (ref 135–145)

## 2021-11-07 LAB — LIPID PANEL
Cholesterol: 155 mg/dL (ref 0–200)
HDL: 55.9 mg/dL (ref >39.00)
LDL Cholesterol: 74 mg/dL (ref 0–99)
NonHDL: 98.94
Total CHOL/HDL Ratio: 3
Triglycerides: 126 mg/dL (ref 0.0–149.0)
VLDL: 25.2 mg/dL (ref 0.0–40.0)

## 2021-11-07 LAB — TSH: TSH: 3.22 u[IU]/mL (ref 0.35–5.50)

## 2021-11-07 MED ORDER — ONDANSETRON HCL 4 MG PO TABS: 4.0000 mg | ORAL_TABLET | Freq: Three times a day (TID) | ORAL | 2 refills | Status: DC | PRN

## 2021-11-07 NOTE — Progress Notes (Signed)
Subjective:  Patient ID: Abigail Lamb, female    DOB: [birth year]  Age: 70 y.o. MRN: [id number]  CC: Annual Exam and COPD  This visit occurred during the SARS-CoV-2 public health emergency.  Safety protocols were in place, including screening questions prior to the visit, additional usage of staff PPE, and extensive cleaning of exam room while observing appropriate contact time as indicated for disinfecting solutions.    HPI [name] presents for a CPX and f/up -  She complains of chronic cough that is productive of yellow phlegm.  She continues to smoke cigarettes.  She complains of 20 pound weight loss with intermittent nausea and dry heaves.  Outpatient Medications Prior to Visit  Medication Sig Dispense Refill   albuterol (VENTOLIN HFA) 108 (90 Base) MCG/ACT inhaler Take 1-2 puffs every 4-6 hrs prn shortness of breath. Needs ventolin.     celecoxib (CELEBREX) 50 MG capsule TAKE 1 CAPSULE(50 MG) BY MOUTH TWICE DAILY AS NEEDED FOR PAIN 60 capsule 3   Cholecalciferol (VITAMIN D3) 125 MCG (5000 UT) CAPS Take 5,000 Units by mouth daily.     clindamycin (CLEOCIN T) 1 % external solution as needed.      colesevelam (WELCHOL) 625 MG tablet TAKE 3 TABLETS(1875 MG) BY MOUTH TWICE DAILY WITH A MEAL 360 tablet 1   dicyclomine (BENTYL) 10 MG capsule Take 1 capsule (10 mg total) by mouth 4 (four) times daily -  before meals and at bedtime. (Patient taking differently: Take 10 mg by mouth 4 (four) times daily as needed.) 270 capsule 0   DULoxetine (CYMBALTA) 60 MG capsule TAKE 1 CAPSULE(60 MG) BY MOUTH DAILY 90 capsule 1   famotidine (PEPCID) 40 MG tablet TAKE 1 TABLET(40 MG) BY MOUTH TWICE DAILY 180 tablet 0   fluocinonide-emollient (LIDEX-E) 0.05 % cream Apply 1 application topically 2 (two) times daily. 60 g 3   folic acid (FOLVITE) 1 MG tablet Take by mouth.     hydroxychloroquine (PLAQUENIL) 200 MG tablet TAKE 1 TABLET(200 MG) BY MOUTH TWICE DAILY 180 tablet 0   loratadine (CLARITIN) 10  MG tablet Take 10 mg by mouth daily.     Methotrexate Sodium (METHOTREXATE, PF,) 50 MG/2ML injection ADMINISTER 1 ML(25 MG) IN THE MUSCLE 1 TIME A WEEK 2 mL 1   Pitavastatin Calcium (LIVALO) 1 MG TABS Take 1 tablet (1 mg total) by mouth daily. 90 tablet 1   TUBERCULIN SYR 1CC/25GX5/8" 25G X 5/8" 1 ML MISC Use 1 syringe weekly for Methotrexate injection. 12 each 3   co-enzyme Q-10 30 MG capsule Take by mouth.     DULoxetine (CYMBALTA) 30 MG capsule Take 1 capsule (30 mg total) by mouth every evening. 90 capsule 1   methotrexate 50 MG/2ML injection Inject 1 mL (25 mg total) into the skin once a week. 12 mL 0   ondansetron (ZOFRAN) 4 MG tablet Take 1 tablet (4 mg total) by mouth every 8 (eight) hours as needed for nausea or vomiting. 40 tablet 2   TURMERIC PO 500 mg by Misc.(Non-Drug; Combo Route) route.     No facility-administered medications prior to visit.    ROS Review of Systems  Constitutional:  Positive for fatigue and unexpected weight change. Negative for chills, diaphoresis and fever.  HENT: Negative.    Eyes: Negative.   Respiratory:  Positive for cough and shortness of breath. Negative for chest tightness and wheezing.   Cardiovascular:  Negative for chest pain, palpitations and leg swelling.  Gastrointestinal:  Positive for nausea and  vomiting. Negative for abdominal pain, blood in stool, constipation, diarrhea and rectal pain.  Endocrine: Negative.   Genitourinary: Negative.  Negative for difficulty urinating, dysuria and hematuria.  Musculoskeletal:  Positive for arthralgias. Negative for back pain and myalgias.  Skin: Negative.  Negative for color change and rash.       +++itching  Neurological: Negative.  Negative for dizziness, weakness, light-headedness and headaches.  Hematological:  Negative for adenopathy. Does not bruise/bleed easily.  Psychiatric/Behavioral:  Positive for dysphoric mood. Negative for sleep disturbance and suicidal ideas. The patient is  nervous/anxious.    Objective:  BP 122/70 (BP Location: Right Arm, Patient Position: Sitting, Cuff Size: Normal)   Pulse (!) 106   Temp 98.3 F (36.8 C) (Oral)   Ht 5\' 2"  (1.575 m)   Wt 73 lb (33.1 kg)   SpO2 96%   BMI 13.35 kg/m   BP Readings from Last 3 Encounters:  11/07/21 122/70  11/09/20 (!) 141/80  11/08/20 117/81    Wt Readings from Last 3 Encounters:  11/07/21 73 lb (33.1 kg)  11/09/20 95 lb 6.4 oz (43.3 kg)  11/08/20 96 lb 6.4 oz (43.7 kg)    Physical Exam Vitals reviewed.  Constitutional:      General: She is not in acute distress.    Appearance: She is cachectic. She is ill-appearing. She is not toxic-appearing or diaphoretic.  HENT:     Nose: Nose normal.     Mouth/Throat:     Mouth: Mucous membranes are moist.  Eyes:     General: No scleral icterus.    Conjunctiva/sclera: Conjunctivae normal.  Cardiovascular:     Rate and Rhythm: Tachycardia present.     Heart sounds: No murmur heard.   No friction rub. No gallop.     Comments: EKG- NSR, 93 bpm BAE No Q waves Pulmonary:     Effort: Pulmonary effort is normal.     Breath sounds: Examination of the right-upper field reveals decreased breath sounds. Examination of the left-upper field reveals decreased breath sounds. Examination of the right-middle field reveals decreased breath sounds. Examination of the left-middle field reveals decreased breath sounds. Examination of the right-lower field reveals decreased breath sounds. Examination of the left-lower field reveals decreased breath sounds. Decreased breath sounds present. No wheezing, rhonchi or rales.  Abdominal:     General: Abdomen is scaphoid. Bowel sounds are normal.     Palpations: Abdomen is soft. There is no hepatomegaly, splenomegaly or mass.     Tenderness: There is no abdominal tenderness. There is no guarding.     Hernia: No hernia is present.  Musculoskeletal:        General: Normal range of motion.     Cervical back: Neck supple.      Right lower leg: No edema.     Left lower leg: No edema.  Feet:     Right foot:     Skin integrity: Dry skin present.     Toenail Condition: Right toenails are abnormally thick. Fungal disease present.    Left foot:     Skin integrity: Dry skin present.     Toenail Condition: Left toenails are abnormally thick. Fungal disease present. Lymphadenopathy:     Cervical: No cervical adenopathy.  Skin:    General: Skin is warm and dry.     Coloration: Skin is pale. Skin is not jaundiced.     Findings: No erythema, lesion or rash.     Comments: ++severe xerosis  Neurological:  General: No focal deficit present.     Mental Status: She is alert. Mental status is at baseline.     Gait: Gait normal.  Psychiatric:        Mood and Affect: Mood normal.        Behavior: Behavior normal.    Lab Results  Component Value Date   WBC 12.6 (H) 11/07/2021   HGB 12.8 11/07/2021   HCT 38.2 11/07/2021   PLT 485.0 (H) 11/07/2021   GLUCOSE 94 11/07/2021   CHOL 155 11/07/2021   TRIG 126.0 11/07/2021   HDL 55.90 11/07/2021   LDLCALC 74 11/07/2021   ALT 8 11/07/2021   AST 18 11/07/2021   NA 135 11/07/2021   K 4.0 11/07/2021   CL 101 11/07/2021   CREATININE 0.65 11/07/2021   BUN 22 11/07/2021   CO2 26 11/07/2021   TSH 3.22 11/07/2021   HGBA1C 5.5 08/02/2020    MM 3D SCREEN BREAST BILATERAL  Result Date: 09/05/2020 CLINICAL DATA:  Screening. EXAM: DIGITAL SCREENING BILATERAL MAMMOGRAM WITH TOMO AND CAD COMPARISON:  Previous exam(s). ACR Breast Density Category b: There are scattered areas of fibroglandular density. FINDINGS: There are no findings suspicious for malignancy. Images were processed with CAD. IMPRESSION: No mammographic evidence of malignancy. A result letter of this screening mammogram will be mailed directly to the patient. RECOMMENDATION: Screening mammogram in one year. (Code:SM-B-01Y) BI-RADS CATEGORY  1: Negative. Electronically Signed   By: Audie Pinto M.D.   On:  09/05/2020 08:08   DG Chest 2 View  Result Date: 11/08/2021 CLINICAL DATA:  Cough.  Dyspnea on exertion.  Weight loss. EXAM: CHEST - 2 VIEW COMPARISON:  01/14/2012 FINDINGS: The heart size and mediastinal contours are within normal limits. Aortic atherosclerotic calcification noted. Pulmonary emphysema is noted. No evidence of pulmonary infiltrate or edema. No evidence of pleural effusion. Nipple shadows are seen overlying both lower lungs. The visualized skeletal structures are unremarkable. IMPRESSION: Emphysema. No active cardiopulmonary disease. Electronically Signed   By: Marlaine Hind M.D.   On: 11/08/2021 08:26     Assessment & Plan:   Tenya was seen today for annual exam and copd.  Diagnoses and all orders for this visit:  Hyperlipidemia LDL goal <130- Statin therapy is not indicated. -     Lipid panel; Future -     TSH; Future -     Hepatic function panel; Future -     Basic metabolic panel; Future -     Basic metabolic panel -     Hepatic function panel -     TSH -     Lipid panel  Irritable bowel syndrome with both constipation and diarrhea  Gastroparesis -     ondansetron (ZOFRAN) 4 MG tablet; Take 1 tablet (4 mg total) by mouth every 8 (eight) hours as needed for nausea or vomiting.  Need for vaccination -     Flu Vaccine QUAD High Dose(Fluad) -     Pneumococcal conjugate vaccine 20-valent (Prevnar 20)  Tachycardia- Her heart rate is 93 on EKG.  This is multifactorial. -     EKG 12-Lead -     CBC with Differential/Platelet; Future -     Basic metabolic panel; Future -     Basic metabolic panel -     CBC with Differential/Platelet  Encounter for general adult medical examination with abnormal findings- Exam completed, labs reviewed, vaccines reviewed and updated, cancer screenings addressed, patient had was given.  Chronic cough- Her chest x-ray is negative  for mass or infiltrate. -     DG Chest 2 View; Future  Leukocytosis, unspecified type -     CBC with  Differential/Platelet; Future -     Basic metabolic panel; Future -     Basic metabolic panel -     CBC with Differential/Platelet -     Ambulatory referral to Hematology / Oncology  Visit for screening mammogram -     MM DIGITAL SCREENING BILATERAL; Future  Neurodermatitis -     hydrOXYzine (ATARAX/VISTARIL) 10 MG tablet; Take 1 tablet (10 mg total) by mouth every 8 (eight) hours as needed.  Recurrent depression ([place])- Will continue duloxetine.  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  I have discontinued Klaire Rasnick's TURMERIC PO and co-enzyme Q-10. I am also having her start on dronabinol and hydrOXYzine. Additionally, I am having her maintain her albuterol, folic acid, clindamycin, Livalo, dicyclomine, Vitamin D3, loratadine, fluocinonide-emollient, TUBERCULIN SYR 1CC/25GX5/8", methotrexate (PF), celecoxib, hydroxychloroquine, colesevelam, DULoxetine, famotidine, and ondansetron.  Meds ordered this encounter  Medications   ondansetron (ZOFRAN) 4 MG tablet    Sig: Take 1 tablet (4 mg total) by mouth every 8 (eight) hours as needed for nausea or vomiting.    Dispense:  40 tablet    Refill:  2   dronabinol (MARINOL) 2.5 MG capsule    Sig: Take 1 capsule (2.5 mg total) by mouth 2 (two) times daily before lunch and supper.    Dispense:  180 capsule    Refill:  0   hydrOXYzine (ATARAX/VISTARIL) 10 MG tablet    Sig: Take 1 tablet (10 mg total) by mouth every 8 (eight) hours as needed.    Dispense:  270 tablet    Refill:  0      Follow-up: Return in about 3 months (around 02/07/2022).  Scarlette Calico, MD

## 2021-11-07 NOTE — Patient Instructions (Signed)

## 2021-11-08 ENCOUNTER — Telehealth: Payer: Self-pay | Admitting: Hematology and Oncology

## 2021-11-08 ENCOUNTER — Telehealth: Payer: Self-pay

## 2021-11-08 DIAGNOSIS — J449 Chronic obstructive pulmonary disease, unspecified: Secondary | ICD-10-CM | POA: Insufficient documentation

## 2021-11-08 DIAGNOSIS — Z23 Encounter for immunization: Secondary | ICD-10-CM

## 2021-11-08 MED ORDER — DRONABINOL 2.5 MG PO CAPS: 2.5000 mg | ORAL_CAPSULE | Freq: Two times a day (BID) | ORAL | 0 refills | Status: DC

## 2021-11-08 MED ORDER — HYDROXYZINE HCL 10 MG PO TABS: 10.0000 mg | ORAL_TABLET | Freq: Three times a day (TID) | ORAL | 0 refills | Status: DC | PRN

## 2021-11-08 NOTE — Telephone Encounter (Signed)
Per CoverMyMeds: ? ?PA was denied.  ?

## 2021-11-08 NOTE — Telephone Encounter (Signed)
Scheduled appt per 11/11 referral. Pt is aware of appt date and time.  

## 2021-11-08 NOTE — Telephone Encounter (Signed)
Key: [id number]

## 2021-11-26 NOTE — Progress Notes (Signed)
Office Visit Note  Patient: Abigail Lamb             Date of Birth: September 14, 1951           MRN: [id number]             PCP: Janith Lima, MD Referring: Janith Lima, MD Visit Date: 12/10/2021 Occupation: @GUAROCC @  Subjective:  Pain in multiple joints.   History of Present Illness: Abigail Lamb is a 70 y.o. female with a history of rheumatoid arthritis and osteoarthritis.  She returns today after her last visit in November 2021.  Patient states in January 2022 she tripped and fractured her left foot.  She was in a boot for a while.  She was visiting her son in [place] at the time.  She states that June 2022 she developed an upper respiratory tract infection.  She was seen at the urgent care and was treated with antibiotics.  She states she had a hard time recovering from it.  She also had a lot of skin peeling.  She has lost a lot of weight since then.  She came off methotrexate about 6 months ago after she ran out of the medication.  She continues to take Plaquenil.  She came back to [facility] about a month ago.  She has been followed by Dr. Ronnald Ramp and Dr. Lorenso Courier closely.  She has some further labs pending.  She continues to have pain and discomfort in her hands and feet.  Since her fall in January 2022 she has had increased discomfort in her lower back, tailbone and her hips.  Activities of Daily Living:  Patient reports morning stiffness for 1-2  hours.   Patient Reports nocturnal pain.  Difficulty dressing/grooming: Reports Difficulty climbing stairs: Reports Difficulty getting out of chair: Reports Difficulty using hands for taps, buttons, cutlery, and/or writing: Reports  Review of Systems  Constitutional:  Positive for fatigue. Negative for night sweats, weight gain and weight loss.  HENT:  Positive for mouth dryness and nose dryness. Negative for mouth sores, trouble swallowing and trouble swallowing.   Eyes:  Positive for itching. Negative for pain,  redness, visual disturbance and dryness.  Respiratory:  Positive for shortness of breath. Negative for cough and difficulty breathing.   Cardiovascular:  Negative for chest pain, palpitations, hypertension, irregular heartbeat and swelling in legs/feet.  Gastrointestinal:  Negative for blood in stool, constipation and diarrhea.  Endocrine: Negative for increased urination.  Genitourinary:  Negative for difficulty urinating and vaginal dryness.  Musculoskeletal:  Positive for joint pain, joint pain, joint swelling, myalgias, morning stiffness, muscle tenderness and myalgias. Negative for muscle weakness.  Skin:  Positive for color change. Negative for rash, hair loss, skin tightness, ulcers and sensitivity to sunlight.  Allergic/Immunologic: Positive for susceptible to infections.  Neurological:  Positive for dizziness, numbness and weakness. Negative for headaches, memory loss and night sweats.  Hematological:  Positive for bruising/bleeding tendency. Negative for swollen glands.  Psychiatric/Behavioral:  Negative for depressed mood, confusion and sleep disturbance. The patient is not nervous/anxious.    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   Routine general medical examination at a health care facility 08/02/2020   Visit for screening mammogram 08/02/2020   Rheumatoid arthritis involving multiple sites with positive rheumatoid factor ([place]) 08/02/2020   Hyperlipidemia LDL goal <130 08/02/2020   Tobacco abuse 08/02/2020   Need for pneumococcal  vaccination 08/02/2020   Dupuytren's contracture of right hand 08/02/2020   Pulmonary emphysema (HCC)    Stage 2 moderate COPD by GOLD classification ([place]) 01/30/2020   Cigarette smoker 01/30/2020   Neurodermatitis 03/28/2019   Primary insomnia 03/28/2019   Recurrent depression ([place]) 03/28/2019    Past Medical History:  Diagnosis Date   Celiac disease    GERD (gastroesophageal reflux disease)    MGUS (monoclonal gammopathy of unknown significance)    per patient    Osteoporosis    per patient, diagnosed by Dr. Shona Simpson in [facility]   PUD (peptic ulcer disease)    Pulmonary emphysema ([place])    Recurrent depression ([place])     Family History  Problem Relation Age of Onset   CVA Mother    CAD Mother    Diabetes Mother    COPD Mother    Fibromyalgia Mother    Depression Mother    Hypertension Mother    Prostate cancer Father    Rheum arthritis Sister    Skin cancer Brother    Kidney Stones Brother    Hernia Brother    Depression Daughter    Healthy Son    Healthy Son    Past Surgical History:  Procedure Laterality Date   ABCESS DRAINAGE Left    under left arm   TONSILLECTOMY  1977   Social History   Social History Narrative   Not on file   Immunization History  Administered Date(s) Administered   Fluad Quad(high Dose 65+) 11/08/2021   Influenza, High Dose Seasonal PF 12/30/2019   Influenza-Unspecified 01/04/2018, 09/11/2020   PNEUMOCOCCAL CONJUGATE-20 11/08/2021   Pneumococcal Polysaccharide-23 08/02/2020   Tdap 11/05/2020     Objective: Vital Signs: BP 110/74 (BP Location: Left Arm, Patient Position: Sitting, Cuff Size: Normal)   Pulse 92   Ht 5\' 2"  (1.575 m)   Wt 79 lb (35.8 kg)   BMI 14.45 kg/m    Physical Exam Vitals and nursing note reviewed.  Constitutional:      Appearance: She is well-developed.  HENT:     Head: Normocephalic and atraumatic.  Eyes:     Conjunctiva/sclera: Conjunctivae normal.  Cardiovascular:     Rate and Rhythm: Normal rate  and regular rhythm.     Heart sounds: Normal heart sounds.  Pulmonary:     Effort: Pulmonary effort is normal.     Breath sounds: Normal breath sounds.  Abdominal:     General: Bowel sounds are normal.     Palpations: Abdomen is soft.  Musculoskeletal:     Cervical back: Normal range of motion.  Lymphadenopathy:     Cervical: No cervical adenopathy.  Skin:    General: Skin is warm and dry.     Capillary Refill: Capillary refill takes less than 2 seconds.  Neurological:     Mental Status: She is alert and oriented to person, place, and time.  Psychiatric:        Behavior: Behavior normal.     Musculoskeletal Exam: C-spine was in good range of motion.  Shoulder joints and elbow joints with good range of motion.  She had synovitis over bilateral wrist joints and MCP joints as described below.  Hip joints and knee joints were in good range of motion without any warmth swelling or effusion.  She had discomfort on palpation of bilateral MTP joints.  She has overcrowding of joints due to rheumatoid arthritis and osteoarthritis overlap.  CDAI Exam: CDAI Score: 11.4  Patient Global: 7 mm; Provider Global: 7 mm Swollen: 5 ; Tender: 5  Joint Exam 12/10/2021      Right  Left  MCP 2  Swollen Tender  Swollen Tender  MCP 3  Swollen Tender     MCP 5  Swollen Tender  Swollen Tender     Investigation: No additional findings.  Imaging: No results found.  Recent Labs: Lab Results  Component Value Date   WBC 12.1 (H) 11/28/2021   HGB 12.6 11/28/2021   PLT 307 11/28/2021   NA 139 11/28/2021   K 4.5 11/28/2021   CL 107 11/28/2021   CO2 23 11/28/2021   GLUCOSE 81 11/28/2021   BUN 16 11/28/2021   CREATININE 0.67 11/28/2021   BILITOT 0.4 11/28/2021   ALKPHOS 137 (H) 11/28/2021   AST 21 11/28/2021   ALT 11 11/28/2021   PROT 8.1 11/28/2021   ALBUMIN 3.3 (L) 11/28/2021   CALCIUM 9.3 11/28/2021   GFRAA 107 10/26/2020   QFTBGOLDPLUS NEGATIVE 09/17/2020    Speciality Comments:  Humira and Orencia were discontinued in the past due to frequent infections per patient.  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 Severe erosive changes were noted in bilateral hands and bilateral feet x-rays was noted at the initial visit.  She lost follow-up and returns today after 1 year later.  She has been off her methotrexate for at least 6 months.  She has been taking hydroxychloroquine on a regular basis.  She has had frequent infections and weight loss.  I am hesitant to restart her on methotrexate at this point.  She has been taking Celebrex per Dr. Ronnald Ramp.  I would wait until her oncology and pulmonary work-up is completed and then discuss treatment options.  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  High risk medication use -(MTX 34ml sw q,-patient stopped them 6 months ago due to infections) PLQ200 BID M-F and prednisone 5mg  qd. Humira and Orencia were discontinued in the past due to frequent infections per pt.   Primary osteoarthritis of both knees-she continues to have pain and discomfort in her knee joints.  She has severe osteoarthritis and chondromalacia patella.  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  She had recent significant weight loss.  She is followed by Dr. Lorenso Courier.  Work-up is pending.  Age-related osteoporosis without current pathological fracture - Treated with Prolia for 1 year in [place] per patient. Discontinued 2016 due to the cost. No other treatment was given. No DEXA available.  I will schedule DEXA scan.  Weight loss-patient reports about 20 pounds weight loss in the last few months.  Stage 2 moderate COPD by GOLD classification (HCC)-not seen her pulmonologist  since last year.  I advised her to schedule an appointment with the pulmonologist as soon as possible for evaluation  Lung nodules - CT scan showed 4 mm nodule in the right lobe.  Panlobular emphysema (HCC)  Prediabetes  Tobacco abuse  Kidney stone - Left-sided nephrolithiasis was noted on the CT  chest.  Recurrent depression ([place])  Primary insomnia  Hyperlipidemia LDL goal <130  Orders: Orders Placed This Encounter  Procedures   DG Bone Density    No orders of the defined types were placed in this encounter.    Follow-Up Instructions: Return in about 2 months (around 02/10/2022) for Rheumatoid arthritis.   Bo Merino, MD  Note - This record has been created using Editor, commissioning.  Chart creation errors have been sought, but may not always  have been located. Such creation errors do not reflect on  the standard of medical care.

## 2021-11-27 ENCOUNTER — Encounter: Payer: Self-pay | Admitting: Internal Medicine

## 2021-11-27 DIAGNOSIS — K582 Mixed irritable bowel syndrome: Secondary | ICD-10-CM

## 2021-11-28 ENCOUNTER — Inpatient Hospital Stay: Payer: Medicare Other

## 2021-11-28 ENCOUNTER — Other Ambulatory Visit: Payer: Self-pay | Admitting: Hematology and Oncology

## 2021-11-28 ENCOUNTER — Inpatient Hospital Stay: Payer: Medicare Other | Attending: Hematology and Oncology | Admitting: Hematology and Oncology

## 2021-11-28 ENCOUNTER — Other Ambulatory Visit: Payer: Self-pay

## 2021-11-28 VITALS — BP 132/81 | HR 97 | Temp 98.5°F | Resp 17 | Ht 62.0 in | Wt 80.7 lb

## 2021-11-28 DIAGNOSIS — D75839 Thrombocytosis, unspecified: Secondary | ICD-10-CM | POA: Insufficient documentation

## 2021-11-28 DIAGNOSIS — D72829 Elevated white blood cell count, unspecified: Secondary | ICD-10-CM | POA: Diagnosis not present

## 2021-11-28 DIAGNOSIS — D472 Monoclonal gammopathy: Secondary | ICD-10-CM | POA: Insufficient documentation

## 2021-11-28 LAB — CBC WITH DIFFERENTIAL (CANCER CENTER ONLY)
Abs Immature Granulocytes: 0.04 10*3/uL (ref 0.00–0.07)
Basophils Absolute: 0.1 10*3/uL (ref 0.0–0.1)
Basophils Relative: 1 %
Eosinophils Absolute: 0.3 10*3/uL (ref 0.0–0.5)
Eosinophils Relative: 2 %
HCT: 39.3 % (ref 36.0–46.0)
Hemoglobin: 12.6 g/dL (ref 12.0–15.0)
Immature Granulocytes: 0 %
Lymphocytes Relative: 22 %
Lymphs Abs: 2.7 10*3/uL (ref 0.7–4.0)
MCH: 30 pg (ref 26.0–34.0)
MCHC: 32.1 g/dL (ref 30.0–36.0)
MCV: 93.6 fL (ref 80.0–100.0)
Monocytes Absolute: 0.7 10*3/uL (ref 0.1–1.0)
Monocytes Relative: 6 %
Neutro Abs: 8.3 10*3/uL — ABNORMAL HIGH (ref 1.7–7.7)
Neutrophils Relative %: 69 %
Platelet Count: 307 10*3/uL (ref 150–400)
RBC: 4.2 MIL/uL (ref 3.87–5.11)
RDW: 15.2 % (ref 11.5–15.5)
WBC Count: 12.1 10*3/uL — ABNORMAL HIGH (ref 4.0–10.5)
nRBC: 0 % (ref 0.0–0.2)

## 2021-11-28 LAB — CMP (CANCER CENTER ONLY)
ALT: 11 U/L (ref 0–44)
AST: 21 U/L (ref 15–41)
Albumin: 3.3 g/dL — ABNORMAL LOW (ref 3.5–5.0)
Alkaline Phosphatase: 137 U/L — ABNORMAL HIGH (ref 38–126)
Anion gap: 9 (ref 5–15)
BUN: 16 mg/dL (ref 8–23)
CO2: 23 mmol/L (ref 22–32)
Calcium: 9.3 mg/dL (ref 8.9–10.3)
Chloride: 107 mmol/L (ref 98–111)
Creatinine: 0.67 mg/dL (ref 0.44–1.00)
GFR, Estimated: >60 mL/min (ref >60)
Glucose, Bld: 81 mg/dL (ref 70–99)
Potassium: 4.5 mmol/L (ref 3.5–5.1)
Sodium: 139 mmol/L (ref 135–145)
Total Bilirubin: 0.4 mg/dL (ref 0.3–1.2)
Total Protein: 8.1 g/dL (ref 6.5–8.1)

## 2021-11-28 LAB — LACTATE DEHYDROGENASE: LDH: 269 U/L — ABNORMAL HIGH (ref 98–192)

## 2021-11-28 MED ORDER — CELECOXIB 50 MG PO CAPS: 50.0000 mg | ORAL_CAPSULE | Freq: Two times a day (BID) | ORAL | 3 refills | Status: DC

## 2021-11-28 MED ORDER — DICYCLOMINE HCL 10 MG PO CAPS
10.0000 mg | ORAL_CAPSULE | Freq: Three times a day (TID) | ORAL | 0 refills | Status: DC
Start: 2021-11-28 — End: 2022-03-25

## 2021-11-28 NOTE — Progress Notes (Signed)
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 Found during rheumatologist evaluation for RA.  2) 10/12/2020: establish care with Dr. Lorenso Courier. M protein 0.6, IgM kappa specificity. Kappa 49.3, Lambda 20.3, ratio 2.43.   3) 10/24/2020: Bone marrow biopsy revealed normocellular marrow with polytypic plasmacytosis (5%).   Interval History:  Abigail Lamb 70 y.o. female with medical history significant for IgM kappa monoclonal gammopathy who presents for a follow up visit. The patient's last visit was on 11/09/2020. In the interim since the last visit the patient briefly moved to [facility] due to a fractured ankle to be closer to her son.  This explains why she was lost to follow-up over the last year.  On exam today Abigail Lamb reports she has been having difficulty with a sinus like infection over the last several weeks.  She notes that she has been following with her PCP and recently got a round of antibiotics which has mildly helped improved her sinus issues.  She notes that the symptoms typically consist of cough, congestion, stomachache, and nausea.  She is not having any fevers, chills, sweats.  She denies any bone or back pain at this time.  A full 10 point ROS is listed below. MEDICAL HISTORY:  Past Medical History:  Diagnosis Date   Celiac disease    GERD (gastroesophageal reflux disease)    MGUS (monoclonal gammopathy of unknown significance)    per patient    Osteoporosis    per patient, diagnosed by Dr. Shona Simpson in [facility]   PUD (peptic ulcer disease)    Pulmonary emphysema ([place])    Recurrent depression ([place])     SURGICAL HISTORY: Past Surgical History:  Procedure Laterality  Date   ABCESS DRAINAGE Left    under left arm   TONSILLECTOMY  1977    SOCIAL HISTORY: Social History   Socioeconomic History   Marital status: Divorced    Spouse name: Not on file   Number of children: Not on file   Years of education: Not on file   Highest education level: Not on file  Occupational History   Not on file  Tobacco Use   Smoking status: Every Day    Packs/day: 0.50    Years: 30.00    Pack years: 15.00    Types: Cigarettes   Smokeless tobacco: Never  Vaping Use   Vaping Use: Never used  Substance and Sexual Activity   Alcohol use: Not Currently   Drug use: Never   Sexual activity: Not Currently  Other Topics Concern   Not on file  Social History Narrative   Not on file   Social Determinants of Health   Financial Resource Strain: Not on file  Food Insecurity: Not on file  Transportation Needs: Not on file  Physical Activity: Not on file  Stress: Not on file  Social Connections: Not on file  Intimate Partner Violence: Not on file    FAMILY HISTORY: Family History  Problem Relation Age of Onset   CVA Mother    CAD Mother    Diabetes Mother    COPD Mother    Fibromyalgia Mother    Depression Mother    Hypertension Mother    Prostate cancer Father    Rheum arthritis Sister  Skin cancer Brother    Kidney Stones Brother    Hernia Brother    Depression Daughter    Healthy Son    Healthy Son     ALLERGIES:  is allergic to dust mite extract.  MEDICATIONS:  Current Outpatient Medications  Medication Sig Dispense Refill   albuterol (VENTOLIN HFA) 108 (90 Base) MCG/ACT inhaler Take 1-2 puffs every 4-6 hrs prn shortness of breath. Needs ventolin.     celecoxib (CELEBREX) 50 MG capsule Take 1 capsule (50 mg total) by mouth 2 (two) times daily. 60 capsule 3   Cholecalciferol (VITAMIN D3) 125 MCG (5000 UT) CAPS Take 5,000 Units by mouth daily.     clindamycin (CLEOCIN T) 1 % external solution as needed.      colesevelam (WELCHOL) 625 MG  tablet TAKE 3 TABLETS(1875 MG) BY MOUTH TWICE DAILY WITH A MEAL 360 tablet 1   dicyclomine (BENTYL) 10 MG capsule Take 1 capsule (10 mg total) by mouth 4 (four) times daily -  before meals and at bedtime. 270 capsule 0   dronabinol (MARINOL) 2.5 MG capsule Take 1 capsule (2.5 mg total) by mouth 2 (two) times daily before lunch and supper. 180 capsule 0   DULoxetine (CYMBALTA) 60 MG capsule TAKE 1 CAPSULE(60 MG) BY MOUTH DAILY 90 capsule 1   famotidine (PEPCID) 40 MG tablet TAKE 1 TABLET(40 MG) BY MOUTH TWICE DAILY 180 tablet 0   fluocinonide-emollient (LIDEX-E) 0.05 % cream Apply 1 application topically 2 (two) times daily. 60 g 3   folic acid (FOLVITE) 1 MG tablet Take by mouth.     hydroxychloroquine (PLAQUENIL) 200 MG tablet TAKE 1 TABLET(200 MG) BY MOUTH TWICE DAILY 180 tablet 0   hydrOXYzine (ATARAX/VISTARIL) 10 MG tablet Take 1 tablet (10 mg total) by mouth every 8 (eight) hours as needed. 270 tablet 0   loratadine (CLARITIN) 10 MG tablet Take 10 mg by mouth daily.     Methotrexate Sodium (METHOTREXATE, PF,) 50 MG/2ML injection ADMINISTER 1 ML(25 MG) IN THE MUSCLE 1 TIME A WEEK 2 mL 1   ondansetron (ZOFRAN) 4 MG tablet Take 1 tablet (4 mg total) by mouth every 8 (eight) hours as needed for nausea or vomiting. 40 tablet 2   Pitavastatin Calcium (LIVALO) 1 MG TABS Take 1 tablet (1 mg total) by mouth daily. 90 tablet 1   TUBERCULIN SYR 1CC/25GX5/8" 25G X 5/8" 1 ML MISC Use 1 syringe weekly for Methotrexate injection. 12 each 3   No current facility-administered medications for this visit.    REVIEW OF SYSTEMS:   Constitutional: ( - ) fevers, ( - )  chills , ( - ) night sweats Eyes: ( - ) blurriness of vision, ( - ) double vision, ( - ) watery eyes Ears, nose, mouth, throat, and face: ( - ) mucositis, ( - ) sore throat Respiratory: ( - ) cough, ( - ) dyspnea, ( - ) wheezes Cardiovascular: ( - ) palpitation, ( - ) chest discomfort, ( - ) lower extremity swelling Gastrointestinal:  ( - )  nausea, ( - ) heartburn, ( - ) change in bowel habits Skin: ( - ) abnormal skin rashes Lymphatics: ( - ) new lymphadenopathy, ( - ) easy bruising Neurological: ( - ) numbness, ( - ) tingling, ( - ) new weaknesses Behavioral/Psych: ( - ) mood change, ( - ) new changes  All other systems were reviewed with the patient and are negative.  PHYSICAL EXAMINATION: ECOG PERFORMANCE STATUS: 1 - Symptomatic but completely  ambulatory  Vitals:   11/28/21 1124  BP: 132/81  Pulse: 97  Resp: 17  Temp: 98.5 F (36.9 C)  SpO2: 98%    Filed Weights   11/28/21 1124  Weight: 80 lb 11.2 oz (36.6 kg)     GENERAL: well appearing elderly Caucasian female in NAD  SKIN: skin color, texture, turgor are normal, no rashes or significant lesions EYES: conjunctiva are pink and non-injected, sclera clear LUNGS: clear to auscultation and percussion with normal breathing effort HEART: regular rate & rhythm and no murmurs and no lower extremity edema Musculoskeletal: no cyanosis of digits and no clubbing. Ulnar deviation of fingers bilaterally   PSYCH: alert & oriented x 3, fluent speech NEURO: no focal motor/sensory deficits  LABORATORY DATA:  I have reviewed the data as listed CBC Latest Ref Rng & Units 11/28/2021 11/07/2021 10/26/2020  WBC 4.0 - 10.5 K/uL 12.1(H) 12.6(H) 13.2(H)  Hemoglobin 12.0 - 15.0 g/dL 12.6 12.8 13.4  Hematocrit 36.0 - 46.0 % 39.3 38.2 40.0  Platelets 150 - 400 K/uL 307 485.0(H) 311    CMP Latest Ref Rng & Units 11/07/2021 10/26/2020 10/12/2020  Glucose 70 - 99 mg/dL 94 118(H) 84  BUN 6 - 23 mg/dL _0 Creatinine 0.40 - 1.20 mg/dL 0.65 0.62 0.70  Sodium 135 - 145 mEq/L 135 141 139  Potassium 3.5 - 5.1 mEq/L 4.0 3.8 4.0  Chloride 96 - 112 mEq/L 101 107 108  CO2 19 - 32 mEq/L _1 Calcium 8.4 - 10.5 mg/dL 9.7 9.5 9.4  Total Protein 6.0 - 8.3 g/dL 8.2 6.9 7.1  Total Bilirubin 0.2 - 1.2 mg/dL 0.4 0.4 0.2(L)  Alkaline Phos 39 - 117 U/L 108 - 99  AST 0 - 37 U/L _2 ALT 0 - 35 U/L _3 Lab Results  Component Value Date   MPROTEIN 0.6 (H) 10/12/2020   Lab Results  Component Value Date   KPAFRELGTCHN 49.3 (H) 10/12/2020   LAMBDASER 20.3 10/12/2020   KAPLAMBRATIO 19.80 11/05/2020   KAPLAMBRATIO 2.43 (H) 10/12/2020    RADIOGRAPHIC STUDIES: DG Chest 2 View  Result Date: 11/08/2021 CLINICAL DATA:  Cough.  Dyspnea on exertion.  Weight loss. EXAM: CHEST - 2 VIEW COMPARISON:  01/14/2012 FINDINGS: The heart size and mediastinal contours are within normal limits. Aortic atherosclerotic calcification noted. Pulmonary emphysema is noted. No evidence of pulmonary infiltrate or edema. No evidence of pleural effusion. Nipple shadows are seen overlying both lower lungs. The visualized skeletal structures are unremarkable. IMPRESSION: Emphysema. No active cardiopulmonary disease. Electronically Signed   By: Marlaine Hind M.D.   On: 11/08/2021 08:26    ASSESSMENT & PLAN Onya Eutsler 70 y.o. female with medical history significant for IgM kappa monoclonal gammopathy who presents for a follow up visit.    After review the labs, review the records, discussion the patient the findings are most consistent with a monoclonal myopathy of undetermined significance.  She underwent bone marrow biopsy which did show a 5% plasma cell population, however this was polytypic in nature.  As such the patient does not require any treatment but only observation from this point moving forward.  The only missing piece of her work-up at this time is her metastatic survey which she requested be performed after December 29, 2020 (but was lost to follow up).  Overall she is at her baseline level of health at this time with no other issues that need addressed.  # IgM  Kappa Monoclonal Gammopathy of Undetermined Significance --findings most consistent with an IgM Kappa MGUS. No CRAB criteria met. Bone marrow biopsy on 10/24/2020 revealed normocellular marrow with polytypic plasmacytosis  (5%).  --assure yearly metastatic survey and UPEP. Testing is currently due.  --assure SPEP, SFLC, CMP, CBC, and LDH at each visit --RTC q 6 months.   #Leukocytosis #Thrombocytosis --WBC 12.6, Plt 485 --suspect transient etiology --recheck and evaluated on labs today.   No orders of the defined types were placed in this encounter.  All questions were answered. The patient knows to call the clinic with any problems, questions or concerns.  A total of more than 30 minutes were spent on this encounter and over half of that time was spent on counseling and coordination of care as outlined above.   Abigail Peoples, MD Department of Hematology/Oncology [place] [facility] Phone: [phone] Pager: [phone] Email: [email or web address]  11/28/2021 12:30 PM    Literature Support:  1) Kyle RA, Durie BG, Rajkumar SV, et al. Monoclonal gammopathy of undetermined significance (MGUS) and smoldering (asymptomatic) multiple myeloma: IMWG consensus perspectives risk factors for progression and guidelines for monitoring and management. Leukemia. 2010;24(6):1121-1127. doi:10.1038/leu.2010.60   --If a patient with apparent MGUS has a serum monoclonal protein >15 g/l, IgA or IgM protein type, or an abnormal FLC ratio, a BM aspirate and biopsy should be carried out at baseline to rule out underlying PC malignancy.   2) Marylyn Ishihara RA, Therneau TM, Rajkumar SV, et al. Prevalence of monoclonal gammopathy of undetermined significance. N Engl J Med 3567;014:1030-1314   --MGUS was found in 3.2 percent of persons 63 years of age or older and 5.3 percent of persons 1 years of age or older.

## 2021-11-29 ENCOUNTER — Ambulatory Visit: Payer: Medicare Other | Admitting: Rheumatology

## 2021-11-29 LAB — KAPPA/LAMBDA LIGHT CHAINS
Kappa free light chain: 104.4 mg/L — ABNORMAL HIGH (ref 3.3–19.4)
Kappa, lambda light chain ratio: 2.31 — ABNORMAL HIGH (ref 0.26–1.65)
Lambda free light chains: 45.2 mg/L — ABNORMAL HIGH (ref 5.7–26.3)

## 2021-11-29 LAB — BETA 2 MICROGLOBULIN, SERUM: Beta-2 Microglobulin: 2.1 mg/L (ref 0.6–2.4)

## 2021-12-02 ENCOUNTER — Ambulatory Visit: Payer: Medicare Other | Admitting: Internal Medicine

## 2021-12-02 LAB — MULTIPLE MYELOMA PANEL, SERUM
Albumin SerPl Elph-Mcnc: 3.2 g/dL (ref 2.9–4.4)
Albumin/Glob SerPl: 0.8 (ref 0.7–1.7)
Alpha 1: 0.3 g/dL (ref 0.0–0.4)
Alpha2 Glob SerPl Elph-Mcnc: 0.8 g/dL (ref 0.4–1.0)
B-Globulin SerPl Elph-Mcnc: 1.2 g/dL (ref 0.7–1.3)
Gamma Glob SerPl Elph-Mcnc: 1.9 g/dL — ABNORMAL HIGH (ref 0.4–1.8)
Globulin, Total: 4.2 g/dL — ABNORMAL HIGH (ref 2.2–3.9)
IgA: 670 mg/dL — ABNORMAL HIGH (ref 87–352)
IgG (Immunoglobin G), Serum: 1463 mg/dL (ref 586–1602)
IgM (Immunoglobulin M), Srm: 924 mg/dL — ABNORMAL HIGH (ref 26–217)
M Protein SerPl Elph-Mcnc: 1.2 g/dL — ABNORMAL HIGH
Total Protein ELP: 7.4 g/dL (ref 6.0–8.5)

## 2021-12-10 ENCOUNTER — Ambulatory Visit: Payer: Medicare Other | Admitting: Rheumatology

## 2021-12-10 ENCOUNTER — Other Ambulatory Visit: Payer: Self-pay

## 2021-12-10 ENCOUNTER — Encounter: Payer: Self-pay | Admitting: Rheumatology

## 2021-12-10 VITALS — BP 110/74 | HR 92 | Ht 62.0 in | Wt 79.0 lb

## 2021-12-10 DIAGNOSIS — M0579 Rheumatoid arthritis with rheumatoid factor of multiple sites without organ or systems involvement: Secondary | ICD-10-CM

## 2021-12-10 DIAGNOSIS — M0630 Rheumatoid nodule, unspecified site: Secondary | ICD-10-CM

## 2021-12-10 DIAGNOSIS — D472 Monoclonal gammopathy: Secondary | ICD-10-CM

## 2021-12-10 DIAGNOSIS — J449 Chronic obstructive pulmonary disease, unspecified: Secondary | ICD-10-CM

## 2021-12-10 DIAGNOSIS — R634 Abnormal weight loss: Secondary | ICD-10-CM

## 2021-12-10 DIAGNOSIS — E785 Hyperlipidemia, unspecified: Secondary | ICD-10-CM

## 2021-12-10 DIAGNOSIS — Z720 Tobacco use: Secondary | ICD-10-CM

## 2021-12-10 DIAGNOSIS — R7303 Prediabetes: Secondary | ICD-10-CM

## 2021-12-10 DIAGNOSIS — M810 Age-related osteoporosis without current pathological fracture: Secondary | ICD-10-CM

## 2021-12-10 DIAGNOSIS — Z79899 Other long term (current) drug therapy: Secondary | ICD-10-CM

## 2021-12-10 DIAGNOSIS — R918 Other nonspecific abnormal finding of lung field: Secondary | ICD-10-CM

## 2021-12-10 DIAGNOSIS — F339 Major depressive disorder, recurrent, unspecified: Secondary | ICD-10-CM

## 2021-12-10 DIAGNOSIS — F5101 Primary insomnia: Secondary | ICD-10-CM

## 2021-12-10 DIAGNOSIS — N200 Calculus of kidney: Secondary | ICD-10-CM

## 2021-12-10 DIAGNOSIS — M170 Bilateral primary osteoarthritis of knee: Secondary | ICD-10-CM | POA: Diagnosis not present

## 2021-12-10 DIAGNOSIS — J431 Panlobular emphysema: Secondary | ICD-10-CM

## 2021-12-10 NOTE — Patient Instructions (Addendum)
Standing Labs We placed an order today for your standing lab work.   Please have your standing labs drawn in 3 months  If possible, please have your labs drawn 2 weeks prior to your appointment so that the provider can discuss your results at your appointment.  Please note that you may see your imaging and lab results in [place] before we have reviewed them. We may be awaiting multiple results to interpret others before contacting you. Please allow our office up to 72 hours to thoroughly review all of the results before contacting the office for clarification of your results.  We have open lab daily: Monday through Thursday from 1:30-4:30 PM and Friday from 1:30-4:00 PM at the office of Dr. Bo Merino, [facility].   Please be advised, all patients with office appointments requiring lab work will take precedent over walk-in lab work.  If possible, please come for your lab work on Monday and Friday afternoons, as you may experience shorter wait times. The office is located at [address] [place], [place], [place] [address] No appointment is necessary.   Labs are drawn by [facility]. Please bring your co-pay at the time of your lab draw.  You may receive a bill from [facility] for your lab work.  If you wish to have your labs drawn at another location, please call the office 24 hours in advance to send orders.  If you have any questions regarding directions or hours of operation,  please call [phone].   As a reminder, please drink plenty of water prior to coming for your lab work. Thanks!   Please schedule an appointment with the pulmonologist and the gastroenterologist.

## 2021-12-19 ENCOUNTER — Encounter (HOSPITAL_COMMUNITY): Payer: Self-pay

## 2021-12-19 ENCOUNTER — Ambulatory Visit (HOSPITAL_COMMUNITY)
Admission: RE | Admit: 2021-12-19 | Discharge: 2021-12-19 | Disposition: A | Discharge status: Home / Self Care | Payer: Medicare Other | Source: Ambulatory Visit | Attending: Internal Medicine | Admitting: Internal Medicine

## 2021-12-19 ENCOUNTER — Other Ambulatory Visit: Payer: Self-pay

## 2021-12-19 ENCOUNTER — Ambulatory Visit (HOSPITAL_COMMUNITY)
Admission: RE | Admit: 2021-12-19 | Discharge: 2021-12-19 | Disposition: A | Discharge status: Home / Self Care | Payer: Medicare Other | Source: Ambulatory Visit | Attending: Hematology and Oncology | Admitting: Hematology and Oncology

## 2021-12-19 DIAGNOSIS — R918 Other nonspecific abnormal finding of lung field: Secondary | ICD-10-CM | POA: Insufficient documentation

## 2021-12-19 DIAGNOSIS — D472 Monoclonal gammopathy: Secondary | ICD-10-CM | POA: Diagnosis present

## 2021-12-20 ENCOUNTER — Other Ambulatory Visit: Payer: Self-pay | Admitting: Internal Medicine

## 2021-12-20 ENCOUNTER — Telehealth: Payer: Self-pay

## 2021-12-20 DIAGNOSIS — J22 Unspecified acute lower respiratory infection: Secondary | ICD-10-CM | POA: Insufficient documentation

## 2021-12-20 NOTE — Telephone Encounter (Signed)
-----  Message from Abigail Kluver, RN sent at 12/20/2021  3:35 PM EST -----  ----- Message ----- From: Abigail Slick, MD Sent: 12/20/2021   9:38 AM EST To: Abigail Kluver, RN  Please let Abigail Lamb know that her DG bone met survey and MGUS labs are relatively stable from prior.  We will continue observation at this time.  Plan to see her back in June 2023. ----- Message ----- From: Abigail Lamb, Lab In [place] Sent: 11/28/2021  12:29 PM EST To: Abigail Slick, MD

## 2021-12-20 NOTE — Telephone Encounter (Signed)
Pt advised with verbal understanding  °

## 2022-01-20 ENCOUNTER — Ambulatory Visit: Payer: Medicare Other

## 2022-01-21 ENCOUNTER — Other Ambulatory Visit: Payer: Self-pay | Admitting: Internal Medicine

## 2022-01-21 DIAGNOSIS — E785 Hyperlipidemia, unspecified: Secondary | ICD-10-CM

## 2022-01-21 DIAGNOSIS — J449 Chronic obstructive pulmonary disease, unspecified: Secondary | ICD-10-CM

## 2022-01-22 ENCOUNTER — Telehealth: Payer: Self-pay

## 2022-01-22 NOTE — Telephone Encounter (Signed)
Key: [id number]

## 2022-01-23 ENCOUNTER — Other Ambulatory Visit: Payer: Self-pay | Admitting: Internal Medicine

## 2022-01-23 ENCOUNTER — Encounter: Payer: Self-pay | Admitting: Internal Medicine

## 2022-01-23 DIAGNOSIS — K279 Peptic ulcer, site unspecified, unspecified as acute or chronic, without hemorrhage or perforation: Secondary | ICD-10-CM

## 2022-01-23 MED ORDER — FAMOTIDINE 40 MG PO TABS: 40.0000 mg | ORAL_TABLET | Freq: Two times a day (BID) | ORAL | 1 refills | Status: DC

## 2022-01-23 NOTE — Telephone Encounter (Signed)
Per CoverMyMeds: ? ?PA was denied.  ?

## 2022-01-28 NOTE — Progress Notes (Signed)
Office Visit Note  Patient: Abigail Lamb             Date of Birth: 05/25/1951           MRN: [id number]             PCP: Janith Lima, MD Referring: Janith Lima, MD Visit Date: 02/11/2022 Occupation: @GUAROCC @  Subjective:  Discuss DEXA results  History of Present Illness: Moni Rothrock is a 71 y.o. female with history of seropositive rheumatoid arthritis, osteoarthritis, and osteoporosis.  Patient is taking Plaquenil 200 mg 1 tablet by mouth twice daily.  She is tolerating Plaquenil without any side effects.  She is no longer taking prednisone.  She discontinued methotrexate due to recurrent infections and weight loss.  She is taking celebrex 50 mg 1 capsule by mouth twice daily for pain relief.  She continues to have chronic pain and intermittent inflammation in both hands and both knee joints.  She has had increased discomfort in her left hip especially when flexing her left hip.  She has been taking Tylenol as needed for breakthrough pain.  She feels that Cymbalta has helped manage some of her discomfort.  Her energy level has been improving.  She has also been working on her nutrition and has started to gain weight gradually.  She continues to have some interrupted sleep at night due to nocturnal pain. She presents today to discuss DEXA results from 02/07/2022.  She previously was on Prolia for 1 year while living in [place] in 2016 but discontinued due to cost.  She has not been on any therapy since then.  She is open to discuss treatment options today.    Activities of Daily Living:  Patient reports morning stiffness for 2 hours.   Patient Reports nocturnal pain.  Difficulty dressing/grooming: Reports Difficulty climbing stairs: Reports Difficulty getting out of chair: Reports Difficulty using hands for taps, buttons, cutlery, and/or writing: Reports  Review of Systems  Constitutional:  Positive for fatigue.  HENT:  Positive for mouth dryness. Negative for mouth  sores and nose dryness.   Eyes:  Positive for dryness. Negative for pain and visual disturbance.  Respiratory:  Negative for cough, hemoptysis and difficulty breathing.   Cardiovascular:  Positive for swelling in legs/feet. Negative for chest pain, palpitations and hypertension.  Gastrointestinal:  Negative for blood in stool, constipation and diarrhea.  Endocrine: Negative for increased urination.  Genitourinary:  Negative for difficulty urinating and painful urination.  Musculoskeletal:  Positive for joint pain, gait problem, joint pain, joint swelling, muscle weakness, morning stiffness and muscle tenderness. Negative for myalgias and myalgias.  Skin:  Positive for rash. Negative for color change, pallor, hair loss, nodules/bumps, skin tightness, ulcers and sensitivity to sunlight.  Allergic/Immunologic: Positive for susceptible to infections.  Neurological:  Positive for weakness. Negative for dizziness and headaches.  Hematological:  Positive for bruising/bleeding tendency. Negative for swollen glands.  Psychiatric/Behavioral:  Positive for sleep disturbance. Negative for depressed mood. The patient is not nervous/anxious.    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 ([place]) 11/08/2021   Tachycardia 11/07/2021   Need for vaccination 11/07/2021   Gastroparesis 11/07/2021   Irritable bowel syndrome with both constipation and diarrhea 11/07/2021   Encounter for general adult medical examination with abnormal findings 11/07/2021   Chronic cough 11/07/2021   Leukocytosis 71/05/2693   Lichen simplex  chronicus 11/05/2020   Routine general medical examination at a health care facility 08/02/2020   Visit for screening mammogram 08/02/2020   Rheumatoid arthritis involving multiple sites with positive rheumatoid factor ([place]) 08/02/2020   Hyperlipidemia  LDL goal <130 08/02/2020   Tobacco abuse 08/02/2020   Need for pneumococcal vaccination 08/02/2020   Dupuytren's contracture of right hand 08/02/2020   Pulmonary emphysema (HCC)    Stage 2 moderate COPD by GOLD classification ([place]) 01/30/2020   Cigarette smoker 01/30/2020   Neurodermatitis 03/28/2019   Primary insomnia 03/28/2019   Recurrent depression ([place]) 03/28/2019    Past Medical History:  Diagnosis Date   Celiac disease    GERD (gastroesophageal reflux disease)    MGUS (monoclonal gammopathy of unknown significance)    per patient    Osteoporosis    per patient, diagnosed by Dr. Shona Simpson in [facility]   PUD (peptic ulcer disease)    Pulmonary emphysema ([place])    Recurrent depression ([place])     Family History  Problem Relation Age of Onset   CVA Mother    CAD Mother    Diabetes Mother    COPD Mother    Fibromyalgia Mother    Depression Mother    Hypertension Mother    Prostate cancer Father    Rheum arthritis Sister    Skin cancer Brother    Kidney Stones Brother    Hernia Brother    Depression Daughter    Healthy Son    Healthy Son    Past Surgical History:  Procedure Laterality Date   ABCESS DRAINAGE Left    under left arm   TONSILLECTOMY  1977   Social History   Social History Narrative   Not on file   Immunization History  Administered Date(s) Administered   Fluad Quad(high Dose 65+) 11/08/2021   Influenza, High Dose Seasonal PF 12/30/2019   Influenza-Unspecified 01/04/2018, 09/11/2020   PNEUMOCOCCAL CONJUGATE-20 11/08/2021   Pneumococcal Polysaccharide-23 08/02/2020   Tdap 11/05/2020     Objective: Vital Signs: BP 96/65 (BP Location: Left Arm, Patient Position: Sitting, Cuff Size: Small)    Pulse (!) 108    Resp 12    Ht 5' 1.75" (1.568 m)    Wt 82 lb (37.2 kg)    BMI 15.12 kg/m    Physical Exam Vitals and nursing note reviewed.  Constitutional:      Appearance: She is well-developed.  HENT:     Head: Normocephalic and atraumatic.  Eyes:      Conjunctiva/sclera: Conjunctivae normal.  Cardiovascular:     Rate and Rhythm: Normal rate and regular rhythm.     Heart sounds: Normal heart sounds.  Pulmonary:     Effort: Pulmonary effort is normal.     Breath sounds: Normal breath sounds.  Abdominal:     General: Bowel sounds are normal.     Palpations: Abdomen is soft.  Musculoskeletal:     Cervical back: Normal range of motion.  Lymphadenopathy:     Cervical: No cervical adenopathy.  Skin:    General: Skin is warm and dry.     Capillary Refill: Capillary refill takes less than 2 seconds.  Neurological:     Mental Status: She is alert and oriented to person, place, and time.  Psychiatric:        Behavior: Behavior normal.     Musculoskeletal Exam: C-spine has slightly limited range of motion with lateral rotation.  Thoracic kyphosis was noted.  Shoulder joints and elbow joints have good range  of motion with no discomfort.  Synovitis over both wrist joints and several MCP joints noted.  PIP and DIP thickening noted.  Painful range of motion with hip flexion on the left side.  Right hip has good range of motion with no discomfort.  Painful range of motion of both knee joints especially the left knee.  Ankle joints have good range of motion with no tenderness or joint swelling.  CDAI Exam: CDAI Score: 16.2  Patient Global: 6 mm; Provider Global: 6 mm Swollen: 7 ; Tender: 9  Joint Exam 02/11/2022      Right  Left  Wrist  Swollen Tender  Swollen Tender  MCP 2  Swollen Tender  Swollen Tender  MCP 3  Swollen Tender     MCP 5  Swollen Tender  Swollen Tender  Hip      Tender  Knee      Tender     Investigation: No additional findings.  Imaging: XR HIP UNILAT W OR W/O PELVIS 2-3 VIEWS LEFT  Result Date: 02/11/2022 No hip joint narrowing or chondrocalcinosis was noted. Impression: Unremarkable x-ray of the hip joint.  XR KNEE 3 VIEW LEFT  Result Date: 02/11/2022 Moderate to severe medial compartment narrowing and  moderate patellofemoral narrowing was noted.  No chondrocalcinosis was noted.  No radiographic progression was noted when compared to the x-rays of 2021. Impression: These findings are consistent with moderate to severe osteoarthritis and moderate chondromalacia patella of the knee.   Recent Labs: Lab Results  Component Value Date   WBC 12.1 (H) 11/28/2021   HGB 12.6 11/28/2021   PLT 307 11/28/2021   NA 139 11/28/2021   K 4.5 11/28/2021   CL 107 11/28/2021   CO2 23 11/28/2021   GLUCOSE 81 11/28/2021   BUN 16 11/28/2021   CREATININE 0.67 11/28/2021   BILITOT 0.4 11/28/2021   ALKPHOS 137 (H) 11/28/2021   AST 21 11/28/2021   ALT 11 11/28/2021   PROT 8.1 11/28/2021   ALBUMIN 3.3 (L) 11/28/2021   CALCIUM 9.3 11/28/2021   GFRAA 107 10/26/2020   QFTBGOLDPLUS NEGATIVE 09/17/2020    Speciality Comments: Humira and Orencia were discontinued in the past due to frequent infections per patient.  Procedures:  No procedures performed Allergies: Dust mite extract    Assessment / Plan:     Visit Diagnoses: Rheumatoid arthritis with rheumatoid factor of multiple sites without organ or systems involvement (HCC) - End-stage severe rheumatoid arthritis with nodulosis and multiple contractures, severe erosive changes noted in both hands and both feet in the past.  She continues to have chronic pain in both hands, both knees, and both feet.  She has tenderness and synovitis of several MCP joints as well as both wrist joints on exam.  No warmth or effusion of her knee joints were noted on examination today.  She is currently taking Plaquenil 200 mg 1 tablet by mouth twice daily.  She is tolerating Plaquenil without any side effects and has not missed any doses recently.  She is no longer taking prednisone.  She has been taking Celebrex 50 mg 1 tablet twice daily for pain relief.  She previously discontinued methotrexate, Humira, and Orencia due to recurrent infections. I reviewed the last office visit  note from 12/10/2021 with Dr. Estanislado Pandy at which time it was recommended to not restart on methotrexate or any other more aggressive treatment at this time.  Dr. Estanislado Pandy recommended following up with pulmonology but the patient has not scheduled an appointment yet. We  will hold off on adding any other immunosuppressive agents at this time.  She will continue taking Plaquenil and Celebrex as prescribed.  She was advised to notify us if she develops increased joint pain or inflammation.  She will follow-up in the office in 2 months and we will further discuss treatment options at that time.  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    High risk medication use - She is taking plaquenil 200 mg 1 tablet by mouth twice daily.  No longer taking prednisone.  Discontinued methotrexate due to recurrent infections.   Humira and Orencia were discontinued in the past due to frequent infections.  CBC and CMP updated on 11/28/21.   No plaquenil eye examination on file. Advised to call to schedule a plaquenil eye examination.  She was given a plaquenil eye examination today.   - Plan: COMPLETE METABOLIC PANEL WITH GFR, CBC with Differential/Platelet  Primary osteoarthritis of both knees: She has chronic pain in both knee joints.  She has good range of motion of both knees on examination today with crepitus bilaterally.  She has been experiencing increased pain in the left knee joint.  No warmth or effusion was noted on exam.  X-rays of the left knee were obtained today which revealed moderate to severe osteoarthritis and moderate chondromalacia patella.  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  She had recent significant weight loss.  She is followed by Dr. Lorenso Courier.   Age-related osteoporosis without current pathological fracture - She was previously treated with Prolia for 1 year in 2016 in [place].  Prolia was discontinued due to cost and she was not transition to any other  therapy at that time.  She has been taking vitamin D 5000 units daily. DEXA updated on 02/07/2022: LFN BMD 0.440 with T score -3.7.  No comparison was available. Results were discussed with the patient today in detail and all questions were addressed. Discussed the importance of initiating therapy to prevent further progression and major osteoporotic fractures in the future. Discussed the risks of discontinuing Prolia without any transition to another agent. Different treatment options were discussed today in detail.  We will apply for Forteo through her insurance.  Indications, contraindications, and potential side effects of Forteo were discussed today in detail.  All questions were addressed.  The following lab work will be obtained today prior to initiating therapy.  She was encouraged to continue to take a calcium and vitamin D supplement daily.  I also discussed the importance of resistive exercises.- Plan: VITAMIN D 25 Hydroxy (Vit-D Deficiency, Fractures), COMPLETE METABOLIC PANEL WITH GFR, CBC with Differential/Platelet, Parathyroid hormone, intact (no Ca), Phosphorus  Vitamin D deficiency -She is taking vitamin D 5000 units daily.  Vitamin D level will be checked today.  Plan: VITAMIN D 25 Hydroxy (Vit-D Deficiency, Fractures)  Pain in left hip - She presents today with increased pain in the left hip joint.  Her discomfort is most severe with left hip flexion.  She is not having any discomfort with internal or external rotation of the left hip joint.  X-rays of the left hip were obtained which were unremarkable.  She was advised to notify us if her symptoms persist or worsen.  She plans on continuing to take Celebrex 50 mg 1 capsule by mouth twice daily for pain relief.  Plan: XR HIP UNILAT W OR W/O PELVIS 2-3 VIEWS LEFT  Chronic pain of left knee -She has been experiencing increased discomfort in the left knee joint.  She has had nocturnal pain intermittently.  X-rays of the left knee were  consistent with moderate to severe osteoarthritis and moderate chondromalacia patella.  plan: XR KNEE 3 VIEW LEFT  Other medical conditions are listed as follows:   Stage 2 moderate COPD by GOLD classification ([place])  Lung nodules  Panlobular emphysema (HCC)  Prediabetes  Tobacco abuse  Kidney stone  Recurrent depression ([place])  Primary insomnia  Hyperlipidemia LDL goal <130    Orders: Orders Placed This Encounter  Procedures   XR HIP UNILAT W OR W/O PELVIS 2-3 VIEWS LEFT   XR KNEE 3 VIEW LEFT   VITAMIN D 25 Hydroxy (Vit-D Deficiency, Fractures)   COMPLETE METABOLIC PANEL WITH GFR   CBC with Differential/Platelet   Parathyroid hormone, intact (no Ca)   Phosphorus   Meds ordered this encounter  Medications   hydroxychloroquine (PLAQUENIL) 200 MG tablet    Sig: TAKE 1 TABLET(200 MG) BY MOUTH TWICE DAILY    Dispense:  180 tablet    Refill:  0     Follow-Up Instructions: Return in 2 months (on 04/11/2022) for Rheumatoid arthritis, Osteoarthritis, Osteoporosis.   Ofilia Neas, PA-C  Note - This record has been created using Dragon software.  Chart creation errors have been sought, but may not always  have been located. Such creation errors do not reflect on  the standard of medical care.

## 2022-02-07 DIAGNOSIS — M810 Age-related osteoporosis without current pathological fracture: Secondary | ICD-10-CM | POA: Diagnosis not present

## 2022-02-07 DIAGNOSIS — Z780 Asymptomatic menopausal state: Secondary | ICD-10-CM | POA: Diagnosis not present

## 2022-02-07 DIAGNOSIS — Z1231 Encounter for screening mammogram for malignant neoplasm of breast: Secondary | ICD-10-CM | POA: Diagnosis not present

## 2022-02-07 LAB — HM MAMMOGRAPHY

## 2022-02-11 ENCOUNTER — Other Ambulatory Visit: Payer: Self-pay

## 2022-02-11 ENCOUNTER — Telehealth: Payer: Self-pay

## 2022-02-11 ENCOUNTER — Ambulatory Visit: Payer: Self-pay

## 2022-02-11 ENCOUNTER — Ambulatory Visit: Payer: Medicare Other | Admitting: Physician Assistant

## 2022-02-11 ENCOUNTER — Encounter: Payer: Self-pay | Admitting: Physician Assistant

## 2022-02-11 VITALS — BP 96/65 | HR 108 | Resp 12 | Ht 61.75 in | Wt 82.0 lb

## 2022-02-11 DIAGNOSIS — G8929 Other chronic pain: Secondary | ICD-10-CM

## 2022-02-11 DIAGNOSIS — E559 Vitamin D deficiency, unspecified: Secondary | ICD-10-CM | POA: Diagnosis not present

## 2022-02-11 DIAGNOSIS — M810 Age-related osteoporosis without current pathological fracture: Secondary | ICD-10-CM

## 2022-02-11 DIAGNOSIS — N200 Calculus of kidney: Secondary | ICD-10-CM

## 2022-02-11 DIAGNOSIS — Z720 Tobacco use: Secondary | ICD-10-CM | POA: Diagnosis not present

## 2022-02-11 DIAGNOSIS — M0630 Rheumatoid nodule, unspecified site: Secondary | ICD-10-CM | POA: Diagnosis not present

## 2022-02-11 DIAGNOSIS — J449 Chronic obstructive pulmonary disease, unspecified: Secondary | ICD-10-CM

## 2022-02-11 DIAGNOSIS — F5101 Primary insomnia: Secondary | ICD-10-CM

## 2022-02-11 DIAGNOSIS — M0579 Rheumatoid arthritis with rheumatoid factor of multiple sites without organ or systems involvement: Secondary | ICD-10-CM | POA: Diagnosis not present

## 2022-02-11 DIAGNOSIS — R918 Other nonspecific abnormal finding of lung field: Secondary | ICD-10-CM

## 2022-02-11 DIAGNOSIS — J431 Panlobular emphysema: Secondary | ICD-10-CM | POA: Diagnosis not present

## 2022-02-11 DIAGNOSIS — Z79899 Other long term (current) drug therapy: Secondary | ICD-10-CM | POA: Diagnosis not present

## 2022-02-11 DIAGNOSIS — M25552 Pain in left hip: Secondary | ICD-10-CM

## 2022-02-11 DIAGNOSIS — M25562 Pain in left knee: Secondary | ICD-10-CM

## 2022-02-11 DIAGNOSIS — F339 Major depressive disorder, recurrent, unspecified: Secondary | ICD-10-CM

## 2022-02-11 DIAGNOSIS — D472 Monoclonal gammopathy: Secondary | ICD-10-CM

## 2022-02-11 DIAGNOSIS — M170 Bilateral primary osteoarthritis of knee: Secondary | ICD-10-CM

## 2022-02-11 DIAGNOSIS — E785 Hyperlipidemia, unspecified: Secondary | ICD-10-CM

## 2022-02-11 DIAGNOSIS — R7303 Prediabetes: Secondary | ICD-10-CM

## 2022-02-11 MED ORDER — HYDROXYCHLOROQUINE SULFATE 200 MG PO TABS: ORAL_TABLET | ORAL | 0 refills | Status: DC

## 2022-02-11 NOTE — Telephone Encounter (Signed)
Please apply for Forteo per Hazel Sams, PA-C. Thanks!

## 2022-02-11 NOTE — Progress Notes (Signed)
X-rays of the left knee revealed findings consistent with moderate to severe OA and moderate chondromalacia patella.  Please notify the patient.  X-rays of the left hip were unremarkable.

## 2022-02-12 ENCOUNTER — Other Ambulatory Visit (HOSPITAL_COMMUNITY): Payer: Self-pay

## 2022-02-12 LAB — CBC WITH DIFFERENTIAL/PLATELET
Absolute Monocytes: 1080 cells/uL — ABNORMAL HIGH (ref 200–950)
Basophils Absolute: 86 cells/uL (ref 0–200)
Basophils Relative: 0.6 %
Eosinophils Absolute: 302 cells/uL (ref 15–500)
Eosinophils Relative: 2.1 %
HCT: 43.9 % (ref 35.0–45.0)
Hemoglobin: 14.4 g/dL (ref 11.7–15.5)
Lymphs Abs: 3787 cells/uL (ref 850–3900)
MCH: 29.7 pg (ref 27.0–33.0)
MCHC: 32.8 g/dL (ref 32.0–36.0)
MCV: 90.5 fL (ref 80.0–100.0)
MPV: 10.8 fL (ref 7.5–12.5)
Monocytes Relative: 7.5 %
Neutro Abs: 9144 cells/uL — ABNORMAL HIGH (ref 1500–7800)
Neutrophils Relative %: 63.5 %
Platelets: 330 10*3/uL (ref 140–400)
RBC: 4.85 10*6/uL (ref 3.80–5.10)
RDW: 12.4 % (ref 11.0–15.0)
Total Lymphocyte: 26.3 %
WBC: 14.4 10*3/uL — ABNORMAL HIGH (ref 3.8–10.8)

## 2022-02-12 LAB — COMPLETE METABOLIC PANEL WITH GFR
AG Ratio: 1 (calc) (ref 1.0–2.5)
ALT: 11 U/L (ref 6–29)
AST: 21 U/L (ref 10–35)
Albumin: 4 g/dL (ref 3.6–5.1)
Alkaline phosphatase (APISO): 118 U/L (ref 37–153)
BUN: 24 mg/dL (ref 7–25)
CO2: 23 mmol/L (ref 20–32)
Calcium: 9.9 mg/dL (ref 8.6–10.4)
Chloride: 102 mmol/L (ref 98–110)
Creat: 0.72 mg/dL (ref 0.50–1.05)
Globulin: 4 g/dL (calc) — ABNORMAL HIGH (ref 1.9–3.7)
Glucose, Bld: 81 mg/dL (ref 65–99)
Potassium: 4.8 mmol/L (ref 3.5–5.3)
Sodium: 139 mmol/L (ref 135–146)
Total Bilirubin: 0.3 mg/dL (ref 0.2–1.2)
Total Protein: 8 g/dL (ref 6.1–8.1)
eGFR: 90 mL/min/{1.73_m2} (ref >=60)

## 2022-02-12 LAB — VITAMIN D 25 HYDROXY (VIT D DEFICIENCY, FRACTURES): Vit D, 25-Hydroxy: 69 ng/mL (ref 30–100)

## 2022-02-12 LAB — PHOSPHORUS: Phosphorus: 4.2 mg/dL (ref 2.1–4.3)

## 2022-02-12 LAB — PARATHYROID HORMONE, INTACT (NO CA): PTH: 44 pg/mL (ref 16–77)

## 2022-02-12 NOTE — Telephone Encounter (Signed)
Received notification from [facility] regarding a prior authorization for [place]. Authorization has been APPROVED from 02/12/22 to 12/28/22.   Per test claim, copay for 28 days supply is $1468.22  Authorization # [id number] Phone # [phone]  Patient did not take home pt assistance application at [facility]. She requested application be emailed. Patient advised of income document requirement for [employer] PAP. She will complete and email back.  Provider portion placed in folder for Abigail Sams, PA-C to sign with PA approval letter, med list, and insurance card copy  Knox Saliva, PharmD, MPH, BCPS Clinical Pharmacist (Rheumatology and Pulmonology)

## 2022-02-12 NOTE — Telephone Encounter (Signed)
Signed provider form for [employer] PAP for Forteo received. Will place with PA approval letter, med list, and insurance card copy in PAP pending info in pharmacy office while we awaiting patient's portion with income documents  Knox Saliva, PharmD, MPH, BCPS Clinical Pharmacist (Rheumatology and Pulmonology)

## 2022-02-12 NOTE — Progress Notes (Signed)
PTH WNL.  Phosphorus WNL.   Vitamin D WNL. WBC count remains elevated.  Absolute neutrophil and monocytes are elevated.  Rest of CBC WNL.   Globulin is elevated-4.0.  rest of CMP WNL.

## 2022-02-12 NOTE — Telephone Encounter (Signed)
Submitted a Prior Authorization request to [facility] for FORTEO via CoverMyMeds. Will update once we receive a response.  Key: [id number] Case ID: [id number]  Knox Saliva, PharmD, MPH, BCPS Clinical Pharmacist (Rheumatology and Pulmonology)

## 2022-02-25 NOTE — Telephone Encounter (Signed)
Called patient to determine status of Forteo PAP application. Phone went straight to VM. Left VM requesting return call for update  Knox Saliva, PharmD, MPH, BCPS Clinical Pharmacist (Rheumatology and Pulmonology)

## 2022-02-27 NOTE — Telephone Encounter (Signed)
Called patient to determine status of Forteo PAP application. Phone went straight to VM. Left VM requesting return call for update'. Will send MyChart message as well. ? ?Knox Saliva, PharmD, MPH, BCPS ?Clinical Pharmacist (Rheumatology and Pulmonology) ? ?

## 2022-03-11 ENCOUNTER — Encounter: Payer: Self-pay | Admitting: Internal Medicine

## 2022-03-11 ENCOUNTER — Ambulatory Visit (INDEPENDENT_AMBULATORY_CARE_PROVIDER_SITE_OTHER): Payer: Medicare Other

## 2022-03-11 ENCOUNTER — Other Ambulatory Visit: Payer: Self-pay

## 2022-03-11 ENCOUNTER — Ambulatory Visit (INDEPENDENT_AMBULATORY_CARE_PROVIDER_SITE_OTHER): Payer: Medicare Other | Admitting: Internal Medicine

## 2022-03-11 VITALS — BP 124/76 | HR 100 | Temp 98.1°F | Ht 61.73 in | Wt 83.0 lb

## 2022-03-11 VITALS — BP 124/76 | HR 100 | Temp 98.1°F | Ht 61.75 in | Wt 83.0 lb

## 2022-03-11 DIAGNOSIS — Z23 Encounter for immunization: Secondary | ICD-10-CM

## 2022-03-11 DIAGNOSIS — J449 Chronic obstructive pulmonary disease, unspecified: Secondary | ICD-10-CM

## 2022-03-11 DIAGNOSIS — M810 Age-related osteoporosis without current pathological fracture: Secondary | ICD-10-CM | POA: Diagnosis not present

## 2022-03-11 DIAGNOSIS — Z0000 Encounter for general adult medical examination without abnormal findings: Secondary | ICD-10-CM | POA: Diagnosis not present

## 2022-03-11 DIAGNOSIS — R64 Cachexia: Secondary | ICD-10-CM | POA: Diagnosis not present

## 2022-03-11 MED ORDER — DRONABINOL 2.5 MG PO CAPS: ORAL_CAPSULE | ORAL | 0 refills | Status: DC

## 2022-03-11 MED ORDER — SHINGRIX 50 MCG/0.5ML IM SUSR: 0.5000 mL | Freq: Once | INTRAMUSCULAR | 1 refills | Status: AC

## 2022-03-11 NOTE — Telephone Encounter (Signed)
ATC #3 patient regarding Forteo PAP application. Phone again went straight to VM. Left VM. ? ?Sent another MyChart message to patient. Will close encounter if no response receive within the next week ? ?Knox Saliva, PharmD, MPH, BCPS ?Clinical Pharmacist (Rheumatology and Pulmonology) ?

## 2022-03-11 NOTE — Patient Instructions (Signed)
Abigail Lamb , ?Thank you for taking time to come for your Medicare Wellness Visit. I appreciate your ongoing commitment to your health goals. Please review the following plan we discussed and let me know if I can assist you in the future.  ? ?Screening recommendations/referrals: ?Colonoscopy: 08/01/2015; due every 10 years ?Mammogram: 02/07/2022; due every year ?Bone Density: 2/10/20123; due every 2 years ?Recommended yearly ophthalmology/optometry visit for glaucoma screening and checkup ?Recommended yearly dental visit for hygiene and checkup ? ?Vaccinations: ?Influenza vaccine: 11/08/2021 ?Pneumococcal vaccine: 08/02/2020, 11/08/2021 ?Tdap vaccine: 11/05/2020; due every 10 years ?Shingles vaccine: scheduled for 2023   ?Covid-19: declined ? ?Advanced directives: Advance directive discussed with you today. I have provided a copy for you to complete at home and have notarized. Once this is complete please bring a copy in to our office so we can scan it into your chart. ? ?Conditions/risks identified: Yes; Client understands the importance of follow-up appointments with providers by attending scheduled visits and discussed goals to eat healthier, increase physical activity 5 times a week for 30 minutes each, exercise the brain by doing stimulating brain exercises (reading, adult coloring, crafting, listening to music, puzzles, etc.), socialize and enjoy life more, get enough sleep at least 8-9 hours average per night and make time for laughter. ? ?Next appointment: Please schedule your next Medicare Wellness Visit with your Nurse Health Advisor in 1 year by calling [phone]. ? ? ?Preventive Care 70 Years and Older, Female ?Preventive care refers to lifestyle choices and visits with your health care provider that can promote health and wellness. ?What does preventive care include? ?A yearly physical exam. This is also called an annual well check. ?Dental exams once or twice a year. ?Routine eye exams. Ask your health  care provider how often you should have your eyes checked. ?Personal lifestyle choices, including: ?Daily care of your teeth and gums. ?Regular physical activity. ?Eating a healthy diet. ?Avoiding tobacco and drug use. ?Limiting alcohol use. ?Practicing safe sex. ?Taking low-dose aspirin every day. ?Taking vitamin and mineral supplements as recommended by your health care provider. ?What happens during an annual well check? ?The services and screenings done by your health care provider during your annual well check will depend on your age, overall health, lifestyle risk factors, and family history of disease. ?Counseling  ?Your health care provider may ask you questions about your: ?Alcohol use. ?Tobacco use. ?Drug use. ?Emotional well-being. ?Home and relationship well-being. ?Sexual activity. ?Eating habits. ?History of falls. ?Memory and ability to understand (cognition). ?Work and work Statistician. ?Reproductive health. ?Screening  ?You may have the following tests or measurements: ?Height, weight, and BMI. ?Blood pressure. ?Lipid and cholesterol levels. These may be checked every 5 years, or more frequently if you are over 27 years old. ?Skin check. ?Lung cancer screening. You may have this screening every year starting at age 64 if you have a 30-pack-year history of smoking and currently smoke or have quit within the past 15 years. ?Fecal occult blood test (FOBT) of the stool. You may have this test every year starting at age 69. ?Flexible sigmoidoscopy or colonoscopy. You may have a sigmoidoscopy every 5 years or a colonoscopy every 10 years starting at age 64. ?Hepatitis C blood test. ?Hepatitis B blood test. ?Sexually transmitted disease (STD) testing. ?Diabetes screening. This is done by checking your blood sugar (glucose) after you have not eaten for a while (fasting). You may have this done every 1-3 years. ?Bone density scan. This is done to screen for  osteoporosis. You may have this done starting at  age 70. ?Mammogram. This may be done every 1-2 years. Talk to your health care provider about how often you should have regular mammograms. ?Talk with your health care provider about your test results, treatment options, and if necessary, the need for more tests. ?Vaccines  ?Your health care provider may recommend certain vaccines, such as: ?Influenza vaccine. This is recommended every year. ?Tetanus, diphtheria, and acellular pertussis (Tdap, Td) vaccine. You may need a Td booster every 10 years. ?Zoster vaccine. You may need this after age 70. ?Pneumococcal 13-valent conjugate (PCV13) vaccine. One dose is recommended after age 70. ?Pneumococcal polysaccharide (PPSV23) vaccine. One dose is recommended after age 69. ?Talk to your health care provider about which screenings and vaccines you need and how often you need them. ?This information is not intended to replace advice given to you by your health care provider. Make sure you discuss any questions you have with your health care provider. ?Document Released: 01/11/2016 Document Revised: 09/03/2016 Document Reviewed: 10/16/2015 ?Elsevier Interactive Patient Education ? 2017 [employer]. ? ?Fall Prevention in the Home ?Falls can cause injuries. They can happen to people of all ages. There are many things you can do to make your home safe and to help prevent falls. ?What can I do on the outside of my home? ?Regularly fix the edges of walkways and driveways and fix any cracks. ?Remove anything that might make you trip as you walk through a door, such as a raised step or threshold. ?Trim any bushes or trees on the path to your home. ?Use bright outdoor lighting. ?Clear any walking paths of anything that might make someone trip, such as rocks or tools. ?Regularly check to see if handrails are loose or broken. Make sure that both sides of any steps have handrails. ?Any raised decks and porches should have guardrails on the edges. ?Have any leaves, snow, or ice cleared  regularly. ?Use sand or salt on walking paths during winter. ?Clean up any spills in your garage right away. This includes oil or grease spills. ?What can I do in the bathroom? ?Use night lights. ?Install grab bars by the toilet and in the tub and shower. Do not use towel bars as grab bars. ?Use non-skid mats or decals in the tub or shower. ?If you need to sit down in the shower, use a plastic, non-slip stool. ?Keep the floor dry. Clean up any water that spills on the floor as soon as it happens. ?Remove soap buildup in the tub or shower regularly. ?Attach bath mats securely with double-sided non-slip rug tape. ?Do not have throw rugs and other things on the floor that can make you trip. ?What can I do in the bedroom? ?Use night lights. ?Make sure that you have a light by your bed that is easy to reach. ?Do not use any sheets or blankets that are too big for your bed. They should not hang down onto the floor. ?Have a firm chair that has side arms. You can use this for support while you get dressed. ?Do not have throw rugs and other things on the floor that can make you trip. ?What can I do in the kitchen? ?Clean up any spills right away. ?Avoid walking on wet floors. ?Keep items that you use a lot in easy-to-reach places. ?If you need to reach something above you, use a strong step stool that has a grab bar. ?Keep electrical cords out of the way. ?Do not  use floor polish or wax that makes floors slippery. If you must use wax, use non-skid floor wax. ?Do not have throw rugs and other things on the floor that can make you trip. ?What can I do with my stairs? ?Do not leave any items on the stairs. ?Make sure that there are handrails on both sides of the stairs and use them. Fix handrails that are broken or loose. Make sure that handrails are as long as the stairways. ?Check any carpeting to make sure that it is firmly attached to the stairs. Fix any carpet that is loose or worn. ?Avoid having throw rugs at the top or  bottom of the stairs. If you do have throw rugs, attach them to the floor with carpet tape. ?Make sure that you have a light switch at the top of the stairs and the bottom of the stairs. If you do not ha

## 2022-03-11 NOTE — Progress Notes (Signed)
? ?Subjective:  ? Abigail Lamb is a 71 y.o. female who presents for Medicare Annual (Subsequent) preventive examination. ? ?Review of Systems    ? ?Cardiac Risk Factors include: advanced age (>11mn, >>57women);dyslipidemia;family history of premature cardiovascular disease;smoking/ tobacco exposure ? ?   ?Objective:  ?  ?Today's Vitals  ? 03/11/22 1428  ?BP: 124/76  ?Pulse: 100  ?Temp: 98.1 ?F (36.7 ?C)  ?SpO2: 98%  ?Weight: 83 lb (37.6 kg)  ?Height: 5' 1.73" (1.568 m)  ? ?Body mass index is 15.31 kg/m?. ? ?Advanced Directives 03/11/2022 11/09/2020 11/05/2020 10/24/2020 10/24/2020  ?Does Patient Have a Medical Advance Directive? No No No No No  ?Would patient like information on creating a medical advance directive? Yes (MAU/Ambulatory/Procedural Areas - Information given) No - Patient declined No - Patient declined No - Patient declined No - Patient declined  ? ? ?Current Medications (verified) ?Outpatient Encounter Medications as of 03/11/2022  ?Medication Sig  ? albuterol (VENTOLIN HFA) 108 (90 Base) MCG/ACT inhaler Take 1-2 puffs every 4-6 hrs prn shortness of breath. Needs ventolin.  ? celecoxib (CELEBREX) 50 MG capsule Take 1 capsule (50 mg total) by mouth 2 (two) times daily.  ? Cholecalciferol (VITAMIN D3) 125 MCG (5000 UT) CAPS Take 5,000 Units by mouth daily.  ? clindamycin (CLEOCIN T) 1 % external solution as needed.   ? colesevelam (WELCHOL) 625 MG tablet TAKE 3 TABLETS BY MOUTH TWICE A DAY WITH MEAL  ? dicyclomine (BENTYL) 10 MG capsule Take 1 capsule (10 mg total) by mouth 4 (four) times daily -  before meals and at bedtime.  ? DULoxetine (CYMBALTA) 60 MG capsule TAKE 1 CAPSULE(60 MG) BY MOUTH DAILY  ? famotidine (PEPCID) 40 MG tablet Take 1 tablet (40 mg total) by mouth 2 (two) times daily.  ? fluocinonide-emollient (LIDEX-E) 0.05 % cream Apply 1 application topically 2 (two) times daily.  ? folic acid (FOLVITE) 1 MG tablet Take by mouth.  ? hydroxychloroquine (PLAQUENIL) 200 MG tablet TAKE 1  TABLET(200 MG) BY MOUTH TWICE DAILY  ? hydrOXYzine (ATARAX/VISTARIL) 10 MG tablet Take 1 tablet (10 mg total) by mouth every 8 (eight) hours as needed.  ? loratadine (CLARITIN) 10 MG tablet Take 10 mg by mouth daily.  ? ondansetron (ZOFRAN) 4 MG tablet Take 1 tablet (4 mg total) by mouth every 8 (eight) hours as needed for nausea or vomiting.  ? Pitavastatin Calcium (LIVALO) 1 MG TABS Take 1 tablet (1 mg total) by mouth daily.  ? TUBERCULIN SYR 1CC/25GX5/8" 25G X 5/8" 1 ML MISC Use 1 syringe weekly for Methotrexate injection.  ? ?No facility-administered encounter medications on file as of 03/11/2022.  ? ? ?Allergies (verified) ?Dust mite extract  ? ?History: ?Past Medical History:  ?Diagnosis Date  ? Celiac disease   ? GERD (gastroesophageal reflux disease)   ? MGUS (monoclonal gammopathy of unknown significance)   ? per patient   ? Osteoporosis   ? [facility] ? PUD (peptic ulcer disease)   ? [place]   ? [place]   ? ?Past Surgical History:  ?Procedure Laterality Date  ? ABCESS DRAINAGE Left   ? under left arm  ? TONSILLECTOMY  1977  ? ?Family History  ?Problem Relation Age of Onset  ? CVA Mother   ? CAD Mother   ? Diabetes Mother   ? COPD Mother   ? Fibromyalgia Mother   ? Depression Mother   ? Hypertension Mother   ?  Prostate cancer Father   ? Rheum arthritis Sister   ? Skin cancer Brother   ? Kidney Stones Brother   ? Hernia Brother   ? Depression Daughter   ? Healthy Son   ? Healthy Son   ? ?Social History  ? ?Socioeconomic History  ? Marital status: Divorced  ?  Spouse name: Not on file  ? Number of children: Not on file  ? Years of education: Not on file  ? Highest education level: Not on file  ?Occupational History  ? Not on file  ?Tobacco Use  ? Smoking status: Every Day  ?  Packs/day: 0.50  ?  Years: 30.00  ?  Pack years: 15.00  ?  Types: Cigarettes  ? Smokeless tobacco: Never  ? Tobacco comments:  ?  Less than 1/2 pack per day.   ?Vaping Use  ? Vaping Use: Never used  ?Substance and Sexual Activity  ? Alcohol use: Not Currently  ? Drug use: Never  ? Sexual activity: Not Currently  ?Other Topics Concern  ? Not on file  ?Social History Narrative  ? Not on file  ? ?Social Determinants of Health  ? ?Financial Resource Strain: Low Risk   ? Difficulty of Paying Living Expenses: Not hard at all  ?Food Insecurity: No Food Insecurity  ? Worried About Charity fundraiser in the Last Year: Never true  ? Ran Out of Food in the Last Year: Never true  ?Transportation Needs: No Transportation Needs  ? Lack of Transportation (Medical): No  ? Lack of Transportation (Non-Medical): No  ?Physical Activity: Inactive  ? Days of Exercise per Week: 0 days  ? Minutes of Exercise per Session: 0 min  ?Stress: No Stress Concern Present  ? Feeling of Stress : Not at all  ?Social Connections: Socially Isolated  ? Frequency of Communication with Friends and Family: More than three times a week  ? Frequency of Social Gatherings with Friends and Family: Twice a week  ? Attends Religious Services: Never  ? Active Member of Clubs or Organizations: No  ? Attends Archivist Meetings: Never  ? Marital Status: Divorced  ? ? ?Tobacco Counseling ?Ready to quit: Not Answered ?Counseling given: Not Answered ?Tobacco comments: Less than 1/2 pack per day. ? ? ?Clinical Intake: ? ?Pre-visit preparation completed: Yes ? 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 ? ?Faces Pain Scale: Hurts whole lot ? ?BMI - recorded: 15.31 ?Nutritional Status: BMI <19  Underweight ?Nutritional Risks: Unintentional weight loss ?Diabetes: No ? ?How often do you  need to have someone help you when you read instructions, pamphlets, or other written materials from your doctor or pharmacy?: 1 - Never ?What is the last grade level you completed in school?: [employer]; 2 years of college ? ?Diabetic? no ? ?Interpreter Needed?: No ? ?Information entered by :: Lisette Abu, LPN ? ? ?Activities of Daily Living ?In your present state of health, do you have any difficulty performing the following activities: 03/11/2022  ?Hearing? N  ?Vision? N  ?Difficulty concentrating or making decisions? N  ?Walking or climbing stairs? N  ?Dressing or bathing? N  ?Doing errands, shopping? N  ?Preparing Food and eating ? N  ?  Using the Toilet? N  ?In the past six months, have you accidently leaked urine? N  ?Do you have problems with loss of bowel control? N  ?Managing your Medications? N  ?Managing your Finances? N  ?Housekeeping or managing your Housekeeping? N  ?Some recent data might be hidden  ? ? ?Patient Care Team: ?Janith Lima, MD as PCP - General (Internal Medicine) ? ?Indicate any recent Medical Services you may have received from other than [facility] providers in the past year (date may be approximate). ? ?   ?Assessment:  ? This is a routine wellness examination for Jakeisha. ? ?Hearing/Vision screen ?Hearing Screening - Comments:: Patient denied any hearing difficulty.   ?No hearing aids. ? ?Vision Screening - Comments:: Patient does wear corrective lenses/contacts.   ?[facility] exam done by: [facility] ? ?Dietary issues and exercise activities discussed: ?Current Exercise Habits: The patient does not participate in regular exercise at present, Exercise limited by: orthopedic condition(s);respiratory conditions(s);psychological condition(s) ? ? Goals Addressed   ? ?  ?  ?  ?  ?  ? This Visit's Progress  ?   Quit Smoking (pt-stated)     ?   I will try to ween myself off cigarettes. ?  ? ?  ?Depression Screen ?PHQ 2/9 Scores 03/11/2022 03/11/2022 11/05/2020 11/05/2020 08/02/2020  ?PHQ - 2 Score 2 2 0  0 0  ?PHQ- 9 Score 2 2 0 0 3  ?  ?Fall Risk ?Fall Risk  03/11/2022 03/11/2022 11/05/2020 08/02/2020  ?Falls in the past year? 1 1 0 0  ?Number falls in past yr: 1 1 0 0  ?Injury with Fall? 1 1 0 0  ?Risk for fall du

## 2022-03-11 NOTE — Patient Instructions (Signed)
Chronic Obstructive Pulmonary Disease ?Chronic obstructive pulmonary disease (COPD) is a long-term (chronic) condition that affects the lungs. COPD is a general term that can be used to describe many different lung problems that cause lung inflammation and limit airflow, including chronic bronchitis and emphysema. ?If you have COPD, your lung function will probably never return to normal. In most cases, it gets worse over time. However, there are steps you can take to slow the progression of the disease and improve your quality of life. ?What are the causes? ?This condition may be caused by: ?Smoking. This is the most common cause. ?Certain genes passed down through families. ?What increases the risk? ?The following factors may make you more likely to develop this condition: ?Being exposed to secondhand smoke from cigarettes, pipes, or cigars. ?Being exposed to chemicals and other irritants, such as fumes and dust in the work environment. ?Having chronic lung conditions or infections. ?What are the signs or symptoms? ?Symptoms of this condition include: ?Shortness of breath, especially during physical activity. ?Chronic cough with a large amount of thick mucus. Sometimes, the cough may not have any mucus (dry cough). ?Wheezing and rapid breathing. ?Gray or bluish discoloration (cyanosis) of the skin, especially in the fingers, toes, or lips. ?Feeling tired (fatigue). ?Weight loss. ?Chest tightness. ?Frequent infections. ?Episodes when breathing symptoms become much worse (exacerbations). ?At the later stages of this disease, you may have swelling in the ankles, feet, or legs. ?How is this diagnosed? ?This condition is diagnosed based on: ?Your medical history. ?A physical exam. ?You may also have tests, including: ?Lung (pulmonary) function tests. This may include a spirometry test, which measures your ability to exhale properly. ?Chest X-ray. ?CT scan. ?Blood tests. ?How is this treated? ?This condition may be  treated with: ?Medicines. These may include inhaled rescue medicines to treat acute exacerbations as well as medicines that you take long-term (maintenance medicines) to prevent flare-ups of COPD. ?Bronchodilators help treat COPD by dilating the airways to allow increased airflow and make your breathing more comfortable. ?Steroids can reduce airway inflammation and help prevent exacerbations. ?Smoking cessation. If you smoke, your health care provider may ask you to quit, and may also recommend therapy or replacement products to help you quit. ?Pulmonary rehabilitation. This may involve working with a team of health care providers and specialists, such as respiratory, occupational, and physical therapists. ?Exercise and physical activity. These are beneficial for nearly all people with COPD. ?Nutrition therapy to gain weight, if you are underweight. ?Oxygen. Supplemental oxygen therapy is only helpful if you have a low oxygen level in your blood (hypoxemia). ?Lung surgery or transplant. ?Palliative care. This is to help people with COPD feel comfortable when treatment is no longer working. ?Follow these instructions at home: ?Medicines ?Take over-the-counter and prescription medicines only as told by your health care provider. This includes inhaled medicines and pills. ?Talk to your health care provider before taking any cough or allergy medicines. You may need to avoid certain medicines that dry out your airways. ?Lifestyle ?If you smoke, the most important thing that you can do is to stop smoking. Continuing to smoke will cause the disease to progress faster. ?Do not use any products that contain nicotine or tobacco. These products include cigarettes, chewing tobacco, and vaping devices, such as e-cigarettes. If you need help quitting, ask your health care provider. ?Avoid exposure to things that irritate your lungs, such as smoke, chemicals, and fumes. ?Stay active, but balance activity with periods of rest.  Exercise and physical   activity will help you maintain your ability to do things you want to do. ?Learn and use relaxation techniques to manage stress and to control your breathing. ?Get the right amount of sleep and get quality sleep. Most adults need 7 or more hours per night. ?Eat healthy foods. Eating smaller, more frequent meals and resting before meals may help you maintain your strength. ?Controlled breathing ?Learn and use controlled breathing techniques as directed by your health care provider. Controlled breathing techniques include: ?Pursed lip breathing. Start by breathing in (inhaling) through your nose for 1 second. Then, purse your lips as if you were going to whistle and breathe out (exhale) through the pursed lips for 2 seconds. ?Diaphragmatic breathing. Start by putting one hand on your abdomen just above your waist. Inhale slowly through your nose. The hand on your abdomen should move out. Then purse your lips and exhale slowly. You should be able to feel the hand on your abdomen moving in as you exhale. ? ?Controlled coughing ?Learn and use controlled coughing to clear mucus from your lungs. Controlled coughing is a series of short, progressive coughs. The steps of controlled coughing are: ?Lean your head slightly forward. ?Breathe in deeply using diaphragmatic breathing. ?Try to hold your breath for 3 seconds. ?Keep your mouth slightly open while coughing twice. ?Spit any mucus out into a tissue. ?Rest and repeat the steps once or twice as needed. ?General instructions ?Make sure you receive all the vaccines that your health care provider recommends, especially the pneumococcal and influenza vaccines. Preventing infection and hospitalization is very important when you have COPD. ?Drink enough fluid to keep your urine pale yellow, unless you have a medical condition that requires fluid restriction. ?Use oxygen therapy and pulmonary rehabilitation if told by your health care provider. If you  require home oxygen therapy, ask your health care provider whether you should purchase a pulse oximeter to measure your oxygen level at home. ?Work with your health care provider to develop a COPD action plan. This will help you know what steps to take if your condition gets worse. ?Keep other chronic health conditions under control as told by your health care provider. ?Avoid extreme temperature and humidity changes. ?Avoid contact with people who have an illness that spreads from person to person (is contagious), such as viral infections or pneumonia. ?Keep all follow-up visits. This is important. ?Contact a health care provider if: ?You are coughing up more mucus than usual. ?There is a change in the color or thickness of your mucus. ?Your breathing is more labored than usual. ?Your breathing is faster than usual. ?You have difficulty sleeping. ?You need to use your rescue medicines or inhalers more often than expected. ?You have trouble doing routine activities such as getting dressed or walking around the house. ?Get help right away if: ?You have shortness of breath while you are resting. ?You have shortness of breath that prevents you from: ?Being able to talk. ?Performing your usual physical activities. ?You have chest pain lasting longer than 5 minutes. ?Your skin color is more blue (cyanotic) than usual. ?You measure low oxygen saturations for longer than 5 minutes with a pulse oximeter. ?You have a fever. ?You feel too tired to breathe normally. ?These symptoms may represent a serious problem that is an emergency. Do not wait to see if the symptoms will go away. Get medical help right away. Call your local emergency services (911 in the [place] Do not drive yourself to the hospital. ?Summary ?Chronic obstructive pulmonary   disease (COPD) is a long-term (chronic) condition that affects the lungs. ?Your lung function will probably never return to normal. In most cases, it gets worse over time. However, there  are steps you can take to slow the progression of the disease and improve your quality of life. ?Treatment for COPD may include taking medicines, quitting smoking, pulmonary rehabilitation, and changes to diet and e

## 2022-03-11 NOTE — Progress Notes (Signed)
Subjective:  Patient ID: Abigail Lamb, female    DOB: 1951/10/27  Age: 71 y.o. MRN: [id number]  CC: COPD  This visit occurred during the SARS-CoV-2 public health emergency.  Safety protocols were in place, including screening questions prior to the visit, additional usage of staff PPE, and extensive cleaning of exam room while observing appropriate contact time as indicated for disinfecting solutions.    HPI Abigail Lamb presents for f/up -  She has gained 4 lbs since she started taking marinol. Her SOB is stable.  Outpatient Medications Prior to Visit  Medication Sig Dispense Refill   albuterol (VENTOLIN HFA) 108 (90 Base) MCG/ACT inhaler Take 1-2 puffs every 4-6 hrs prn shortness of breath. Needs ventolin.     celecoxib (CELEBREX) 50 MG capsule Take 1 capsule (50 mg total) by mouth 2 (two) times daily. 60 capsule 3   Cholecalciferol (VITAMIN D3) 125 MCG (5000 UT) CAPS Take 5,000 Units by mouth daily.     clindamycin (CLEOCIN T) 1 % external solution as needed.      colesevelam (WELCHOL) 625 MG tablet TAKE 3 TABLETS BY MOUTH TWICE A DAY WITH MEAL 180 tablet 1   dicyclomine (BENTYL) 10 MG capsule Take 1 capsule (10 mg total) by mouth 4 (four) times daily -  before meals and at bedtime. 270 capsule 0   DULoxetine (CYMBALTA) 60 MG capsule TAKE 1 CAPSULE(60 MG) BY MOUTH DAILY 90 capsule 1   famotidine (PEPCID) 40 MG tablet Take 1 tablet (40 mg total) by mouth 2 (two) times daily. 180 tablet 1   fluocinonide-emollient (LIDEX-E) 0.05 % cream Apply 1 application topically 2 (two) times daily. 60 g 3   folic acid (FOLVITE) 1 MG tablet Take by mouth.     hydroxychloroquine (PLAQUENIL) 200 MG tablet TAKE 1 TABLET(200 MG) BY MOUTH TWICE DAILY 180 tablet 0   hydrOXYzine (ATARAX/VISTARIL) 10 MG tablet Take 1 tablet (10 mg total) by mouth every 8 (eight) hours as needed. 270 tablet 0   loratadine (CLARITIN) 10 MG tablet Take 10 mg by mouth daily.     ondansetron (ZOFRAN) 4 MG tablet Take 1  tablet (4 mg total) by mouth every 8 (eight) hours as needed for nausea or vomiting. 40 tablet 2   Pitavastatin Calcium (LIVALO) 1 MG TABS Take 1 tablet (1 mg total) by mouth daily. 90 tablet 1   TUBERCULIN SYR 1CC/25GX5/8" 25G X 5/8" 1 ML MISC Use 1 syringe weekly for Methotrexate injection. 12 each 3   dronabinol (MARINOL) 2.5 MG capsule TAKE 1 CAPSULE BY MOUTH 2 TIMES DAILY BEFORE LUNCH AND SUPPER 60 capsule 2   No facility-administered medications prior to visit.    ROS Review of Systems  Constitutional:  Negative for chills, diaphoresis, fatigue and fever.  HENT: Negative.    Eyes: Negative.   Respiratory:  Positive for shortness of breath. Negative for cough, chest tightness and wheezing.   Cardiovascular:  Negative for chest pain, palpitations and leg swelling.  Gastrointestinal:  Negative for abdominal pain, constipation and diarrhea.  Endocrine: Negative.   Genitourinary: Negative.  Negative for difficulty urinating.  Musculoskeletal:  Positive for arthralgias. Negative for myalgias.  Neurological: Negative.   Hematological:  Negative for adenopathy. Does not bruise/bleed easily.  Psychiatric/Behavioral: Negative.     Objective:  BP 124/76 (BP Location: Right Arm, Patient Position: Sitting, Cuff Size: Normal)   Pulse 100   Temp 98.1 F (36.7 C) (Oral)   Ht 5' 1.75" (1.568 m)   Wt 83 lb (37.6  kg)   SpO2 98%   BMI 15.30 kg/m   BP Readings from Last 3 Encounters:  03/11/22 124/76  03/11/22 124/76  02/11/22 96/65    Wt Readings from Last 3 Encounters:  03/11/22 83 lb (37.6 kg)  03/11/22 83 lb (37.6 kg)  02/11/22 82 lb (37.2 kg)    Physical Exam Vitals reviewed.  Constitutional:      General: She is not in acute distress.    Appearance: She is cachectic. She is ill-appearing. She is not toxic-appearing or diaphoretic.  HENT:     Mouth/Throat:     Mouth: Mucous membranes are moist.  Eyes:     General: No scleral icterus.    Conjunctiva/sclera: Conjunctivae  normal.  Cardiovascular:     Rate and Rhythm: Normal rate and regular rhythm.     Heart sounds: No murmur heard. Pulmonary:     Effort: Pulmonary effort is normal.     Breath sounds: No stridor. No wheezing, rhonchi or rales.  Abdominal:     General: Abdomen is flat.     Palpations: There is no mass.     Tenderness: There is no abdominal tenderness. There is no guarding.     Hernia: No hernia is present.  Musculoskeletal:        General: Normal range of motion.     Cervical back: Neck supple.     Right lower leg: No edema.  Lymphadenopathy:     Cervical: No cervical adenopathy.  Skin:    General: Skin is warm.  Neurological:     General: No focal deficit present.     Mental Status: She is alert.    Lab Results  Component Value Date   WBC 14.4 (H) 02/11/2022   HGB 14.4 02/11/2022   HCT 43.9 02/11/2022   PLT 330 02/11/2022   GLUCOSE 81 02/11/2022   CHOL 155 11/07/2021   TRIG 126.0 11/07/2021   HDL 55.90 11/07/2021   LDLCALC 74 11/07/2021   ALT 11 02/11/2022   AST 21 02/11/2022   NA 139 02/11/2022   K 4.8 02/11/2022   CL 102 02/11/2022   CREATININE 0.72 02/11/2022   BUN 24 02/11/2022   CO2 23 02/11/2022   TSH 3.22 11/07/2021   HGBA1C 5.5 08/02/2020    CT Chest Wo Contrast  Result Date: 12/20/2021 CLINICAL DATA:  Abnormal xray - lung nodule, < 1 cm, mod-high risk. Unintentional weight loss, chronic cough, continued smoking. EXAM: CT CHEST WITHOUT CONTRAST TECHNIQUE: Multidetector CT imaging of the chest was performed following the standard protocol without IV contrast. COMPARISON:  02/08/2020 FINDINGS: Cardiovascular: Mild coronary artery calcification. Global cardiac size within normal limits. No pericardial effusion. Central pulmonary arteries are of normal caliber. Mild atherosclerotic calcification within the thoracic aorta. No aortic aneurysm. Mediastinum/Nodes: No enlarged mediastinal or axillary lymph nodes. Thyroid gland, trachea, and esophagus demonstrate no  significant findings. Lungs/Pleura: Severe emphysema. Scattered areas of peripheral airway impaction and peribronchial nodularity within the right middle lobe and right lower lobe may be infectious or inflammatory in nature. No confluent pulmonary infiltrate. 4 mm noncalcified pulmonary nodule within the right lower lobe, axial image # 75/5 is stable since prior examination. Mean 7 mm nodular opacity within the right costophrenic angle at axial image # 131/5 may be inflammatory in nature but is new from prior examination and is indeterminate. No pneumothorax or pleural effusion. No central obstructing lesion. Upper Abdomen: 4 mm nonobstructing calculus within the visualized left kidney. Musculoskeletal: No acute bone abnormality. No lytic or  blastic bone lesion. IMPRESSION: Severe emphysema. 7 mm possibly inflammatory but indeterminate nodule within the right lower lobe. Non-contrast chest CT at 6-12 months is recommended. If the nodule is stable at time of repeat CT, then future CT at 18-24 months (from today's scan) is considered optional for low-risk patients, but is recommended for high-risk patients. This recommendation follows the consensus statement: Guidelines for Management of Incidental Pulmonary Nodules Detected on CT Images: From the Fleischner Society 2017; Radiology 2017; 284:228-243. Stable 4 mm nodule within the right lower lobe, likely benign given its stability over time. Mild coronary artery calcification At least mild left nonobstructing nephrolithiasis Aortic Atherosclerosis (ICD10-I70.0) and Emphysema (ICD10-J43.9). Electronically Signed   By: Helyn Numbers M.D.   On: 12/20/2021 00:46   DG Bone Survey Met  Result Date: 12/20/2021 CLINICAL DATA:  MGUS, assess for lytic lesions. EXAM: METASTATIC BONE SURVEY COMPARISON:  None. FINDINGS: No focal lytic lesions are identified. Degenerative changes are noted within the wrists bilaterally. Degenerative changes are noted within the cervical spine  at C4-C7 with reversal of the normal cervical lordosis and grade 1 anterolisthesis of C4 upon C5. The lungs are symmetrically hyperinflated in keeping with changes of underlying COPD. IMPRESSION: No focal lytic lesions identified. COPD Electronically Signed   By: Helyn Numbers M.D.   On: 12/20/2021 00:39    Assessment & Plan:   Anina was seen today for copd.  Diagnoses and all orders for this visit:  Need for shingles vaccine -     Zoster Vaccine Adjuvanted [facility]) injection; Inject 0.5 mLs into the muscle once for 1 dose.  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 -     dronabinol (MARINOL) 2.5 MG capsule; TAKE 1 CAPSULE BY MOUTH 2 TIMES DAILY BEFORE LUNCH AND SUPPER  Stage 2 moderate COPD by GOLD classification (HCC)-  This is stable.   Age-related osteoporosis without current pathological fracture- I recommend that she start ana anabolic agent (evenity.)   I am having Jenel Lucks start on Shingrix. I am also having her maintain her albuterol, folic acid, clindamycin, Livalo, Vitamin D3, loratadine, fluocinonide-emollient, TUBERCULIN SYR 1CC/25GX5/8", DULoxetine, ondansetron, hydrOXYzine, celecoxib, dicyclomine, colesevelam, famotidine, hydroxychloroquine, and dronabinol.  Meds ordered this encounter  Medications   Zoster Vaccine Adjuvanted [facility]) injection    Sig: Inject 0.5 mLs into the muscle once for 1 dose.    Dispense:  0.5 mL    Refill:  1   dronabinol (MARINOL) 2.5 MG capsule    Sig: TAKE 1 CAPSULE BY MOUTH 2 TIMES DAILY BEFORE LUNCH AND SUPPER    Dispense:  180 capsule    Refill:  0     Follow-up: Return in about 6 months (around 09/11/2022).  Sanda Linger, MD

## 2022-03-12 ENCOUNTER — Telehealth: Payer: Self-pay

## 2022-03-12 NOTE — Telephone Encounter (Signed)
Prolia VOB initiated via [email or web address] ? ?Prolia to Evenity ? ? ? ?Abigail Lima, MD  Jasper Loser, CMA ?Will you see if she is a candidate for evenity?  ? ?TJ  ?

## 2022-03-13 NOTE — Telephone Encounter (Signed)
Prior auth required for EVENITY ? ?PA PROCESS DETAILS: Effective 12/29/2021 if the patient is new to [place], Prior authorization and Step ?Therapy are required & not on file. Please go to [email or web address] or call [phone] to ?initiate the prior authorization. For exception to the policy please visit ?[email or web address] and review Policy Number [email or web address]. ?

## 2022-03-18 NOTE — Telephone Encounter (Signed)
Patient has not returned calls or MyChart message. Per chart review, patient's PCP is pursuing Evenity for the patient ? ?Knox Saliva, PharmD, MPH, BCPS ?Clinical Pharmacist (Rheumatology and Pulmonology) ?

## 2022-03-25 ENCOUNTER — Other Ambulatory Visit: Payer: Self-pay | Admitting: Internal Medicine

## 2022-03-25 DIAGNOSIS — K582 Mixed irritable bowel syndrome: Secondary | ICD-10-CM

## 2022-03-25 DIAGNOSIS — L280 Lichen simplex chronicus: Secondary | ICD-10-CM

## 2022-03-26 ENCOUNTER — Telehealth: Payer: Self-pay

## 2022-03-26 ENCOUNTER — Other Ambulatory Visit: Payer: Self-pay | Admitting: Internal Medicine

## 2022-03-26 DIAGNOSIS — J449 Chronic obstructive pulmonary disease, unspecified: Secondary | ICD-10-CM

## 2022-03-26 MED ORDER — DRONABINOL 2.5 MG PO CAPS: ORAL_CAPSULE | ORAL | 0 refills | Status: DC

## 2022-03-26 NOTE — Telephone Encounter (Signed)
Pt  is requesting a new rx be sent for dronabinol (MARINOL) 2.5 MG capsule due to [facility] [address] not having it in stock. ? ?Pharmacy: ?[id number]- [place], [place] - [address] AT [address]? ? ?

## 2022-04-05 NOTE — Telephone Encounter (Signed)
Prior auth initiated via [facility] Provider Portal ?Case ID#: [id number] ? ? ?

## 2022-04-09 NOTE — Telephone Encounter (Signed)
Prior auth APPROVED ?PA# [id number] ? ? ? ? ?

## 2022-04-09 NOTE — Telephone Encounter (Addendum)
Pt ready for scheduling on or after 04/09/22 ? ?Out-of-pocket cost due at time of visit: $448 ? ?Primary: [employer] Medicare ?Evenity co-insurance: 20% (approximately $418) ?Admin fee co-insurance: $35 ? ?Secondary: n/a ?Evenity co-insurance:  ?Admin fee co-insurance:  ? ?Deductible: does not apply ? ?Prior Auth: APPROVED ?PA# [id number] ?Valid: 04/05/22-10/05/22 ? ?** This summary of benefits is an estimation of the patient's out-of-pocket cost. Exact cost may vary based on individual plan coverage.  ? ?

## 2022-04-15 ENCOUNTER — Encounter: Payer: Self-pay | Admitting: Internal Medicine

## 2022-04-15 ENCOUNTER — Other Ambulatory Visit: Payer: Self-pay | Admitting: Physician Assistant

## 2022-04-15 ENCOUNTER — Other Ambulatory Visit: Payer: Self-pay | Admitting: Internal Medicine

## 2022-04-15 DIAGNOSIS — K3184 Gastroparesis: Secondary | ICD-10-CM

## 2022-04-15 DIAGNOSIS — M0579 Rheumatoid arthritis with rheumatoid factor of multiple sites without organ or systems involvement: Secondary | ICD-10-CM

## 2022-04-16 ENCOUNTER — Other Ambulatory Visit: Payer: Self-pay | Admitting: Internal Medicine

## 2022-04-16 DIAGNOSIS — F3341 Major depressive disorder, recurrent, in partial remission: Secondary | ICD-10-CM

## 2022-04-16 MED ORDER — DULOXETINE HCL 60 MG PO CPEP: ORAL_CAPSULE | ORAL | 1 refills | Status: DC

## 2022-04-17 ENCOUNTER — Encounter: Payer: Self-pay | Admitting: Internal Medicine

## 2022-04-18 ENCOUNTER — Other Ambulatory Visit: Payer: Self-pay | Admitting: Internal Medicine

## 2022-04-18 DIAGNOSIS — J22 Unspecified acute lower respiratory infection: Secondary | ICD-10-CM | POA: Insufficient documentation

## 2022-04-18 MED ORDER — CEFDINIR 300 MG PO CAPS: 300.0000 mg | ORAL_CAPSULE | Freq: Two times a day (BID) | ORAL | 0 refills | Status: AC

## 2022-04-28 NOTE — Progress Notes (Signed)
? ?Office Visit Note ? ?Patient: Abigail Lamb             ?Date of Birth: 1951-10-25           ?MRN: [id number]             ?PCP: Janith Lima, MD ?Referring: Janith Lima, MD ?Visit Date: 05/12/2022 ?Occupation: '@GUAROCC'$ @ ? ?Subjective:  ?Discuss osteoporosis treatment options ? ?History of Present Illness: Abigail Lamb is a 71 y.o. female with history of seropositive rheumatoid arthritis, osteoarthritis, and osteoporosis.  Patient remains on Plaquenil 200 mg 1 tablet by mouth twice daily.  She continues to tolerate Plaquenil without any side effects and has not missed any doses recently.  She has ongoing pain and stiffness in both hands.  She has been taking Celebrex 50 mg 1 capsule by mouth twice daily for pain relief.  She continues to experience occasional infections.  She was recently treated for an upper respiratory infection with an antibiotic prescribed by her PCP.  She continues to have some residual symptoms of congestion and a cough.  She has not followed back up with her pulmonologist yet as advised at her last office visit. ?She reports that she has been considering treatment options for osteoporosis and is ready to proceed with Forteo. ?She has a Plaquenil eye examination scheduled next month. ? ? ? ?Activities of Daily Living:  ?Patient reports morning stiffness for 2 hours.   ?Patient Reports nocturnal pain.  ?Difficulty dressing/grooming: Reports ?Difficulty climbing stairs: Reports ?Difficulty getting out of chair: Reports ?Difficulty using hands for taps, buttons, cutlery, and/or writing: Reports ? ?Review of Systems  ?Constitutional:  Positive for fatigue.  ?HENT:  Positive for mouth dryness and nose dryness. Negative for mouth sores.   ?Eyes:  Positive for itching. Negative for pain and dryness.  ?Respiratory:  Positive for shortness of breath. Negative for difficulty breathing.   ?Cardiovascular:  Negative for chest pain and palpitations.  ?Gastrointestinal:  Negative for blood in  stool, constipation and diarrhea.  ?Endocrine: Negative for increased urination.  ?Genitourinary:  Negative for difficulty urinating.  ?Musculoskeletal:  Positive for joint pain, joint pain, joint swelling and morning stiffness. Negative for myalgias, muscle tenderness and myalgias.  ?Skin:  Positive for color change and redness. Negative for rash.  ?Allergic/Immunologic: Positive for susceptible to infections.  ?Neurological:  Positive for numbness and weakness. Negative for dizziness, headaches and memory loss.  ?Hematological:  Positive for bruising/bleeding tendency.  ?Psychiatric/Behavioral:  Negative for confusion.   ? ?PMFS History:  ?Patient Active Problem List  ? Diagnosis Date Noted  ? LRTI (lower respiratory tract infection) 04/18/2022  ? Age-related osteoporosis without current pathological fracture 03/11/2022  ? 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 Gastroparesis 11/07/2021  ? Irritable bowel syndrome with both constipation and diarrhea 11/07/2021  ? Encounter for general adult medical examination with abnormal findings 11/07/2021  ? Leukocytosis 11/07/2021  ? 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 Routine general medical examination at a health care facility 08/02/2020  ? Visit for screening mammogram 08/02/2020  ? Rheumatoid arthritis involving multiple sites with positive rheumatoid factor ([place]) 08/02/2020  ? Hyperlipidemia LDL goal <130 08/02/2020  ? Tobacco abuse 08/02/2020  ? Dupuytren's contracture of right hand 08/02/2020  ? Pulmonary emphysema ([place])   ? Stage 2 moderate COPD by GOLD classification ([place]) 01/30/2020  ? Neurodermatitis 03/28/2019  ? Primary insomnia 03/28/2019  ? Recurrent depression ([place]) 03/28/2019  ?  ?Past Medical History:  ?Diagnosis Date  ?  Celiac disease   ? GERD (gastroesophageal reflux disease)   ? MGUS (monoclonal gammopathy of unknown significance)   ? per patient   ? Osteoporosis   ? per patient, diagnosed by Dr. Shona Simpson in [facility]  ? PUD (peptic ulcer disease)   ? Pulmonary emphysema ([place])   ? Recurrent depression ([place])   ?  ?Family History  ?Problem Relation Age of Onset  ? CVA Mother   ? CAD Mother   ? Diabetes Mother   ? COPD Mother   ? Fibromyalgia Mother   ? Depression Mother   ? Hypertension Mother   ? Prostate cancer Father   ? Rheum arthritis Sister   ? Skin cancer Brother   ? Kidney Stones Brother   ? Hernia Brother   ? Depression Daughter   ? Healthy Son   ? Healthy Son   ? ?Past Surgical History:  ?Procedure Laterality Date  ? ABCESS DRAINAGE Left   ? under left arm  ? TONSILLECTOMY  1977  ? ?Social History  ? ?Social History Narrative  ? Not on file  ? ?Immunization History  ?Administered Date(s) Administered  ? Fluad Quad(high Dose 65+) 11/08/2021  ? Influenza, High Dose Seasonal PF 12/30/2019  ? Influenza-Unspecified 01/04/2018, 09/11/2020  ? PNEUMOCOCCAL CONJUGATE-20 11/08/2021  ? Pneumococcal Polysaccharide-23 08/02/2020  ? Tdap 11/05/2020  ?  ? ?Objective: ?Vital Signs: BP 111/75 (BP Location: Right Arm, Patient Position: Sitting, Cuff Size: Normal)   Pulse 93   Ht '5\' 2"'$  (1.575 m)   Wt 77 lb 12.8 oz (35.3 kg)   BMI 14.23 kg/m?   ? ?Physical Exam ?Vitals and nursing note reviewed.  ?Constitutional:   ?   Appearance: She is well-developed.  ?HENT:  ?   Head: Normocephalic and atraumatic.  ?Eyes:  ?   Conjunctiva/sclera: Conjunctivae normal.  ?Cardiovascular:  ?   Rate and Rhythm: Normal rate and regular rhythm.  ?   Heart sounds: Normal heart sounds.  ?Pulmonary:  ?   Effort: Pulmonary effort is normal.  ?   Breath sounds: Normal breath sounds.  ?Abdominal:  ?   General: Bowel sounds are normal.  ?   Palpations: Abdomen is soft.  ?Musculoskeletal:  ?   Cervical back: Normal range of motion.  ?Skin: ?   General: Skin is warm and dry.  ?   Capillary Refill: Capillary refill takes less than 2 seconds.  ?Neurological:  ?   Mental Status: She is alert and oriented to person, place, and time.  ?Psychiatric:     ?    Behavior: Behavior normal.  ?  ? ?Musculoskeletal Exam: C-spine has limited range of motion with lateral rotation.  Thoracic kyphosis noted.  Shoulder joints and elbow joints have good range of motion.  Rheumatoid nodules noted on the extensor surface of both elbows.  Synovitis of both wrist and several MCP and PIP joints as described below.  PIP and DIP thickening noted.  Ulnar deviation most severe in the right hand noted.  Painful limited range of motion of the left hip.  Painful range of motion of both knee joints with no warmth or effusion.  Ankle joints have good range of motion with no joint tenderness or synovitis. ? ?CDAI Exam: ?CDAI Score: 13.2  ?Patient Global: 6 mm; Provider Global: 6 mm ?Swollen: 6 ; Tender: 6  ?Joint Exam 05/12/2022  ? ?   Right  Left  ?Wrist  Swollen Tender  Swollen Tender  ?MCP 2  Swollen Tender     ?  MCP 3  Swollen Tender     ?PIP 3     Swollen Tender  ?PIP 4     Swollen Tender  ? ? ? ?Investigation: ?No additional findings. ? ?Imaging: ?No results found. ? ?Recent Labs: ?Lab Results  ?Component Value Date  ? WBC 14.4 (H) 02/11/2022  ? HGB 14.4 02/11/2022  ? PLT 330 02/11/2022  ? NA 139 02/11/2022  ? K 4.8 02/11/2022  ? CL 102 02/11/2022  ? CO2 23 02/11/2022  ? GLUCOSE 81 02/11/2022  ? BUN 24 02/11/2022  ? CREATININE 0.72 02/11/2022  ? BILITOT 0.3 02/11/2022  ? ALKPHOS 137 (H) 11/28/2021  ? AST 21 02/11/2022  ? ALT 11 02/11/2022  ? PROT 8.0 02/11/2022  ? ALBUMIN 3.3 (L) 11/28/2021  ? CALCIUM 9.9 02/11/2022  ? GFRAA 107 10/26/2020  ? QFTBGOLDPLUS NEGATIVE 09/17/2020  ? ? ?Speciality Comments: Humira and Orencia were discontinued in the past due to frequent infections per patient. ? ?Procedures:  ?No procedures performed ?Allergies: Dust mite extract  ? ? ?Assessment / Plan:     ?Visit Diagnoses: Rheumatoid arthritis with rheumatoid factor of multiple sites without organ or systems involvement (HCC) - End-stage severe rheumatoid arthritis with nodulosis and multiple contractures,  severe erosive changes noted in both hands and both feet in the past: She has ongoing pain and stiffness involving multiple joints.  She has tenderness and synovitis of both wrists and several MCP and PIP joints as d

## 2022-05-12 ENCOUNTER — Ambulatory Visit: Payer: Medicare Other | Admitting: Physician Assistant

## 2022-05-12 ENCOUNTER — Other Ambulatory Visit: Payer: Self-pay | Admitting: Internal Medicine

## 2022-05-12 ENCOUNTER — Telehealth: Payer: Self-pay | Admitting: Pharmacist

## 2022-05-12 ENCOUNTER — Encounter: Payer: Self-pay | Admitting: Physician Assistant

## 2022-05-12 VITALS — BP 111/75 | HR 93 | Ht 62.0 in | Wt 77.8 lb

## 2022-05-12 DIAGNOSIS — M810 Age-related osteoporosis without current pathological fracture: Secondary | ICD-10-CM

## 2022-05-12 DIAGNOSIS — M0579 Rheumatoid arthritis with rheumatoid factor of multiple sites without organ or systems involvement: Secondary | ICD-10-CM | POA: Diagnosis not present

## 2022-05-12 DIAGNOSIS — M25552 Pain in left hip: Secondary | ICD-10-CM

## 2022-05-12 DIAGNOSIS — F339 Major depressive disorder, recurrent, unspecified: Secondary | ICD-10-CM

## 2022-05-12 DIAGNOSIS — M170 Bilateral primary osteoarthritis of knee: Secondary | ICD-10-CM | POA: Diagnosis not present

## 2022-05-12 DIAGNOSIS — R7303 Prediabetes: Secondary | ICD-10-CM

## 2022-05-12 DIAGNOSIS — R918 Other nonspecific abnormal finding of lung field: Secondary | ICD-10-CM

## 2022-05-12 DIAGNOSIS — Z79899 Other long term (current) drug therapy: Secondary | ICD-10-CM | POA: Diagnosis not present

## 2022-05-12 DIAGNOSIS — N200 Calculus of kidney: Secondary | ICD-10-CM

## 2022-05-12 DIAGNOSIS — J449 Chronic obstructive pulmonary disease, unspecified: Secondary | ICD-10-CM | POA: Diagnosis not present

## 2022-05-12 DIAGNOSIS — J431 Panlobular emphysema: Secondary | ICD-10-CM | POA: Diagnosis not present

## 2022-05-12 DIAGNOSIS — E559 Vitamin D deficiency, unspecified: Secondary | ICD-10-CM

## 2022-05-12 DIAGNOSIS — M0630 Rheumatoid nodule, unspecified site: Secondary | ICD-10-CM

## 2022-05-12 DIAGNOSIS — F5101 Primary insomnia: Secondary | ICD-10-CM

## 2022-05-12 DIAGNOSIS — D472 Monoclonal gammopathy: Secondary | ICD-10-CM | POA: Diagnosis not present

## 2022-05-12 DIAGNOSIS — G8929 Other chronic pain: Secondary | ICD-10-CM

## 2022-05-12 DIAGNOSIS — E785 Hyperlipidemia, unspecified: Secondary | ICD-10-CM

## 2022-05-12 DIAGNOSIS — R5383 Other fatigue: Secondary | ICD-10-CM

## 2022-05-12 DIAGNOSIS — Z720 Tobacco use: Secondary | ICD-10-CM

## 2022-05-12 MED ORDER — HYDROXYCHLOROQUINE SULFATE 200 MG PO TABS: ORAL_TABLET | ORAL | 0 refills | Status: DC

## 2022-05-12 NOTE — Telephone Encounter (Signed)
Patient had OV today and signed her portion of [employer] application for Forteo PAP. She will try to find electronic copy of SS letter and email to me or otherwise drop off paper copy at clinic. ? ?Provider portion completed today. Signed provider and patient forms, insurance card copy, med list, and PA approval letter placed in PAP pending info folder in pharmacy office while we await patient's income documents. ? ?Knox Saliva, PharmD, MPH, BCPS, CPP ?Clinical Pharmacist (Rheumatology and Pulmonology) ?

## 2022-05-12 NOTE — Patient Instructions (Signed)
Standing Labs ?We placed an order today for your standing lab work.  ? ?Please have your standing labs drawn in July and every 5 months  ? ?If possible, please have your labs drawn 2 weeks prior to your appointment so that the provider can discuss your results at your appointment. ? ?Please note that you may see your imaging and lab results in [place] before we have reviewed them. ?We may be awaiting multiple results to interpret others before contacting you. ?Please allow our office up to 72 hours to thoroughly review all of the results before contacting the office for clarification of your results. ? ?We have open lab daily: ?Monday through Thursday from 1:30-4:30 PM and Friday from 1:30-4:00 PM ?at the office of Dr. Bo Merino, [facility].   ?Please be advised, all patients with office appointments requiring lab work will take precedent over walk-in lab work.  ?If possible, please come for your lab work on Monday and Friday afternoons, as you may experience shorter wait times. ?The office is located at [address], [place], [place], [place] [address] ?No appointment is necessary.   ?Labs are drawn by [facility]. Please bring your co-pay at the time of your lab draw.  You may receive a bill from [facility] for your lab work. ? ?Please note if you are on Hydroxychloroquine and and an order has been placed for a Hydroxychloroquine level, you will need to have it drawn 4 hours or more after your last dose. ? ?If you wish to have your labs drawn at another location, please call the office 24 hours in advance to send orders. ? ?If you have any questions regarding directions or hours of operation,  ?please call [phone].   ?As a reminder, please drink plenty of water prior to coming for your lab work. Thanks! ? ?

## 2022-05-12 NOTE — Addendum Note (Signed)
Addended by: Earnestine Mealing on: 05/12/2022 03:18 PM ? ? Modules accepted: Orders ? ?

## 2022-05-13 ENCOUNTER — Other Ambulatory Visit: Payer: Self-pay | Admitting: Internal Medicine

## 2022-05-13 DIAGNOSIS — E785 Hyperlipidemia, unspecified: Secondary | ICD-10-CM

## 2022-05-14 NOTE — Telephone Encounter (Signed)
Submitted Patient Assistance Application to [employer] for [place] along with provider portion, patient portion, med list, insurance card copy, PA and income documents. Will update patient when we receive a response. ? ?Fax: [phone] ?Phone: [phone] ? ?Knox Saliva, PharmD, MPH, BCPS, CPP ?Clinical Pharmacist (Rheumatology and Pulmonology) ?

## 2022-05-19 ENCOUNTER — Telehealth: Payer: Self-pay | Admitting: Rheumatology

## 2022-05-19 NOTE — Telephone Encounter (Signed)
Received a fax from  [facility] regarding an approval for [place] patient assistance from 05/15/22 to 12/28/22.   Phone number: [phone]  Patient will have to reach out to schedule first shipment. Pen needles for Forteo will have to be filled by local pharmacy.  ATC patient to review approval . Unable to reach. Left VM requesting return call.  Knox Saliva, PharmD, MPH, BCPS, CPP Clinical Pharmacist (Rheumatology and Pulmonology)

## 2022-05-19 NOTE — Telephone Encounter (Signed)
Patient called requesting to speak with [facility] directly regarding her medication.

## 2022-05-20 MED ORDER — INSULIN PEN NEEDLE 31G X 5 MM MISC: 2 refills | Status: DC

## 2022-05-20 NOTE — Telephone Encounter (Signed)
Called patient regarding [employer] PAP approval for Forteo. Provided her with phone number. Advised her to call company to set up first shipment if she does not receive call from them by Friday, 05/23/22.  I advised that pen needles rx has been sent to local [facility]. Reviewed that pen needles must be attached daily and cannot be reused. Reviewed Forteo storage, injection admin directions, injection sites. Reviewed risk for dizziness and that she should take before bedtime if she is prone to dizziness and to err on side of caution.  Briefly reviewed re-enrollment process for 2024 and importance of completing application in timely manner to prevent interruption in therapy.  I requested that she reach out when she receives medication so that I can notate in her chart when she starts Forteo therapy. I also offered for her to return to clinic to complete first dose together if she would like or can walk through administration over the phone as well.  She verbalized understanding to all of the above.  Knox Saliva, PharmD, MPH, BCPS, CPP Clinical Pharmacist (Rheumatology and Pulmonology)

## 2022-05-20 NOTE — Telephone Encounter (Signed)
Returned call to patient. Documented in previous telephone encounter regarding Forteo patient assistance  Knox Saliva, PharmD, MPH, BCPS, CPP Clinical Pharmacist (Rheumatology and Pulmonology)

## 2022-05-29 ENCOUNTER — Inpatient Hospital Stay: Payer: Medicare Other | Admitting: Hematology and Oncology

## 2022-05-29 ENCOUNTER — Other Ambulatory Visit: Payer: Self-pay | Admitting: Hematology and Oncology

## 2022-05-29 ENCOUNTER — Other Ambulatory Visit: Payer: Self-pay

## 2022-05-29 ENCOUNTER — Inpatient Hospital Stay: Payer: Medicare Other | Attending: Hematology and Oncology

## 2022-05-29 VITALS — BP 129/66 | HR 110 | Temp 98.2°F | Resp 18 | Ht 62.0 in | Wt 78.2 lb

## 2022-05-29 DIAGNOSIS — Z808 Family history of malignant neoplasm of other organs or systems: Secondary | ICD-10-CM | POA: Diagnosis not present

## 2022-05-29 DIAGNOSIS — M810 Age-related osteoporosis without current pathological fracture: Secondary | ICD-10-CM | POA: Insufficient documentation

## 2022-05-29 DIAGNOSIS — M069 Rheumatoid arthritis, unspecified: Secondary | ICD-10-CM | POA: Diagnosis not present

## 2022-05-29 DIAGNOSIS — Z8042 Family history of malignant neoplasm of prostate: Secondary | ICD-10-CM | POA: Diagnosis not present

## 2022-05-29 DIAGNOSIS — D472 Monoclonal gammopathy: Secondary | ICD-10-CM

## 2022-05-29 DIAGNOSIS — F17210 Nicotine dependence, cigarettes, uncomplicated: Secondary | ICD-10-CM | POA: Insufficient documentation

## 2022-05-29 DIAGNOSIS — D72829 Elevated white blood cell count, unspecified: Secondary | ICD-10-CM | POA: Insufficient documentation

## 2022-05-29 LAB — CBC WITH DIFFERENTIAL (CANCER CENTER ONLY)
Abs Immature Granulocytes: 0.02 10*3/uL (ref 0.00–0.07)
Basophils Absolute: 0.1 10*3/uL (ref 0.0–0.1)
Basophils Relative: 1 %
Eosinophils Absolute: 0.2 10*3/uL (ref 0.0–0.5)
Eosinophils Relative: 2 %
HCT: 40.5 % (ref 36.0–46.0)
Hemoglobin: 13.6 g/dL (ref 12.0–15.0)
Immature Granulocytes: 0 %
Lymphocytes Relative: 23 %
Lymphs Abs: 2.7 10*3/uL (ref 0.7–4.0)
MCH: 30.7 pg (ref 26.0–34.0)
MCHC: 33.6 g/dL (ref 30.0–36.0)
MCV: 91.4 fL (ref 80.0–100.0)
Monocytes Absolute: 0.7 10*3/uL (ref 0.1–1.0)
Monocytes Relative: 6 %
Neutro Abs: 8.2 10*3/uL — ABNORMAL HIGH (ref 1.7–7.7)
Neutrophils Relative %: 68 %
Platelet Count: 269 10*3/uL (ref 150–400)
RBC: 4.43 MIL/uL (ref 3.87–5.11)
RDW: 14.1 % (ref 11.5–15.5)
WBC Count: 11.8 10*3/uL — ABNORMAL HIGH (ref 4.0–10.5)
nRBC: 0 % (ref 0.0–0.2)

## 2022-05-29 LAB — CMP (CANCER CENTER ONLY)
ALT: 14 U/L (ref 0–44)
AST: 23 U/L (ref 15–41)
Albumin: 3.5 g/dL (ref 3.5–5.0)
Alkaline Phosphatase: 114 U/L (ref 38–126)
Anion gap: 8 (ref 5–15)
BUN: 15 mg/dL (ref 8–23)
CO2: 26 mmol/L (ref 22–32)
Calcium: 9.8 mg/dL (ref 8.9–10.3)
Chloride: 104 mmol/L (ref 98–111)
Creatinine: 0.69 mg/dL (ref 0.44–1.00)
GFR, Estimated: >60 mL/min (ref >60)
Glucose, Bld: 107 mg/dL — ABNORMAL HIGH (ref 70–99)
Potassium: 4.4 mmol/L (ref 3.5–5.1)
Sodium: 138 mmol/L (ref 135–145)
Total Bilirubin: 0.4 mg/dL (ref 0.3–1.2)
Total Protein: 7.6 g/dL (ref 6.5–8.1)

## 2022-05-29 LAB — LACTATE DEHYDROGENASE: LDH: 247 U/L — ABNORMAL HIGH (ref 98–192)

## 2022-05-29 NOTE — Progress Notes (Signed)
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 Found during rheumatologist evaluation for RA.  2) 10/12/2020: establish care with Dr. Lorenso Courier. M protein 0.6, IgM kappa specificity. Kappa 49.3, Lambda 20.3, ratio 2.43.   3) 10/24/2020: Bone marrow biopsy revealed normocellular marrow with polytypic plasmacytosis (5%).   Interval History:  Abigail Lamb 71 y.o. female with medical history significant for IgM kappa monoclonal gammopathy who presents for a follow up visit. The patient's last visit was on 11/28/2021. In the interim since the last visit she has had no major changes in her health.  On exam today Abigail Lamb reports she has had major challenges in the interim since her last visit.  She reports that she struggles with osteoporosis and will be starting Forteo.  She will be continuing this for up to 2 years.  She also notes that she will need to be seeing a pulmonary doctor.  Her RA is currently "horrible" though she is not taking any medications for this.  She is also doing her best to try to take care of her 64 year old mother.  She reports her energy is quite low.  She is not having any fevers, chills, sweats.  She denies any bone or back pain at this time.  A full 10 point ROS is listed below. MEDICAL HISTORY:  Past Medical History:  Diagnosis Date   Celiac disease    GERD (gastroesophageal reflux disease)    MGUS (monoclonal gammopathy of unknown significance)    per patient    Osteoporosis    per patient, diagnosed by Dr. Shona Simpson in [facility]   PUD (peptic ulcer disease)    Pulmonary emphysema ([place])    Recurrent depression ([place])     SURGICAL HISTORY: Past Surgical History:  Procedure  Laterality Date   ABCESS DRAINAGE Left    under left arm   TONSILLECTOMY  1977    SOCIAL HISTORY: Social History   Socioeconomic History   Marital status: Divorced    Spouse name: Not on file   Number of children: Not on file   Years of education: Not on file   Highest education level: Not on file  Occupational History   Not on file  Tobacco Use   Smoking status: Every Day    Packs/day: 0.50    Years: 30.00    Pack years: 15.00    Types: Cigarettes    Passive exposure: Never   Smokeless tobacco: Never   Tobacco comments:    Less than 1/2 pack per day.  Vaping Use   Vaping Use: Never used  Substance and Sexual Activity   Alcohol use: Not Currently   Drug use: Never   Sexual activity: Not Currently  Other Topics Concern   Not on file  Social History Narrative   Not on file   Social Determinants of Health   Financial Resource Strain: Low Risk    Difficulty of Paying Living Expenses: Not hard at all  Food Insecurity: No Food Insecurity   Worried About Charity fundraiser in the Last Year: Never true   [place] in the Last Year: Never true  Transportation Needs: No Transportation Needs   Lack of Transportation (Medical): No   Lack of Transportation (Non-Medical): No  Physical  Activity: Inactive   Days of Exercise per Week: 0 days   Minutes of Exercise per Session: 0 min  Stress: No Stress Concern Present   Feeling of Stress : Not at all  Social Connections: Socially Isolated   Frequency of Communication with Friends and Family: More than three times a week   Frequency of Social Gatherings with Friends and Family: Twice a week   Attends Religious Services: Never   Marine scientist or Organizations: No   Attends Music therapist: Never   Marital Status: Divorced  Human resources officer Violence: Not At Risk   Fear of Current or Ex-Partner: No   Emotionally Abused: No   Physically Abused: No   Sexually Abused: No    FAMILY  HISTORY: Family History  Problem Relation Age of Onset   CVA Mother    CAD Mother    Diabetes Mother    COPD Mother    Fibromyalgia Mother    Depression Mother    Hypertension Mother    Prostate cancer Father    Rheum arthritis Sister    Skin cancer Brother    Kidney Stones Brother    Hernia Brother    Depression Daughter    Healthy Son    Healthy Son     ALLERGIES:  is allergic to dust mite extract.  MEDICATIONS:  Current Outpatient Medications  Medication Sig Dispense Refill   albuterol (VENTOLIN HFA) 108 (90 Base) MCG/ACT inhaler Take 1-2 puffs every 4-6 hrs prn shortness of breath. Needs ventolin.     celecoxib (CELEBREX) 50 MG capsule TAKE 1 CAPSULE BY MOUTH 2 TIMES DAILY. 60 capsule 3   Cholecalciferol (VITAMIN D3) 125 MCG (5000 UT) CAPS Take 5,000 Units by mouth daily.     clindamycin (CLEOCIN T) 1 % external solution as needed.      colesevelam (WELCHOL) 625 MG tablet TAKE 3 TABLETS BY MOUTH TWICE A DAY WITH MEAL 180 tablet 1   dicyclomine (BENTYL) 10 MG capsule TAKE 1 CAPSULE (10 MG TOTAL) BY MOUTH 4 TIMES A DAY BEFORE MEALS AND AT BEDTIME 270 capsule 0   dronabinol (MARINOL) 2.5 MG capsule TAKE 1 CAPSULE BY MOUTH 2 TIMES DAILY BEFORE LUNCH AND SUPPER 180 capsule 0   DULoxetine (CYMBALTA) 60 MG capsule TAKE 1 CAPSULE(60 MG) BY MOUTH DAILY 90 capsule 1   famotidine (PEPCID) 40 MG tablet Take 1 tablet (40 mg total) by mouth 2 (two) times daily. 180 tablet 1   fluocinonide-emollient (LIDEX-E) 0.05 % cream Apply 1 application topically 2 (two) times daily. 60 g 3   folic acid (FOLVITE) 1 MG tablet Take by mouth.     hydroxychloroquine (PLAQUENIL) 200 MG tablet TAKE 1 TABLET(200 MG) BY MOUTH TWICE DAILY 180 tablet 0   hydrOXYzine (ATARAX) 10 MG tablet TAKE 1 TABLET BY MOUTH EVERY 8 HOURS AS NEEDED. 270 tablet 0   Insulin Pen Needle 31G X 5 MM MISC Use to inject Forteo once daily. DO NOT REUSE NEEDLE. 100 each 2   loratadine (CLARITIN) 10 MG tablet Take 10 mg by mouth daily.      ondansetron (ZOFRAN) 4 MG tablet TAKE 1 TABLET BY MOUTH EVERY 8 HOURS AS NEEDED FOR NAUSEA AND VOMITING 40 tablet 2   Pitavastatin Calcium (LIVALO) 1 MG TABS Take 1 tablet (1 mg total) by mouth daily. (Patient not taking: Reported on 05/12/2022) 90 tablet 1   TUBERCULIN SYR 1CC/25GX5/8" 25G X 5/8" 1 ML MISC Use 1 syringe weekly for Methotrexate injection. (Patient  not taking: Reported on 05/12/2022) 12 each 3   No current facility-administered medications for this visit.    REVIEW OF SYSTEMS:   Constitutional: ( - ) fevers, ( - )  chills , ( - ) night sweats Eyes: ( - ) blurriness of vision, ( - ) double vision, ( - ) watery eyes Ears, nose, mouth, throat, and face: ( - ) mucositis, ( - ) sore throat Respiratory: ( - ) cough, ( - ) dyspnea, ( - ) wheezes Cardiovascular: ( - ) palpitation, ( - ) chest discomfort, ( - ) lower extremity swelling Gastrointestinal:  ( - ) nausea, ( - ) heartburn, ( - ) change in bowel habits Skin: ( - ) abnormal skin rashes Lymphatics: ( - ) new lymphadenopathy, ( - ) easy bruising Neurological: ( - ) numbness, ( - ) tingling, ( - ) new weaknesses Behavioral/Psych: ( - ) mood change, ( - ) new changes  All other systems were reviewed with the patient and are negative.  PHYSICAL EXAMINATION: ECOG PERFORMANCE STATUS: 1 - Symptomatic but completely ambulatory  Vitals:   05/29/22 1407  BP: 129/66  Pulse: (!) 110  Resp: 18  Temp: 98.2 F (36.8 C)  SpO2: 96%    Filed Weights   05/29/22 1407  Weight: 78 lb 3.2 oz (35.5 kg)     GENERAL: well appearing elderly Caucasian female in NAD  SKIN: skin color, texture, turgor are normal, no rashes or significant lesions EYES: conjunctiva are pink and non-injected, sclera clear LUNGS: clear to auscultation and percussion with normal breathing effort HEART: regular rate & rhythm and no murmurs and no lower extremity edema Musculoskeletal: no cyanosis of digits and no clubbing. Ulnar deviation of fingers  bilaterally   PSYCH: alert & oriented x 3, fluent speech NEURO: no focal motor/sensory deficits  LABORATORY DATA:  I have reviewed the data as listed    Latest Ref Rng & Units 05/29/2022    1:20 PM 02/11/2022    2:03 PM 11/28/2021   12:01 PM  CBC  WBC 4.0 - 10.5 K/uL 11.8   14.4   12.1    Hemoglobin 12.0 - 15.0 g/dL 13.6   14.4   12.6    Hematocrit 36.0 - 46.0 % 40.5   43.9   39.3    Platelets 150 - 400 K/uL 269   330   307         Latest Ref Rng & Units 05/29/2022    1:20 PM 02/11/2022    2:03 PM 11/28/2021   12:01 PM  CMP  Glucose 70 - 99 mg/dL 107   81   81    BUN 8 - 23 mg/dL _0 Creatinine 0.44 - 1.00 mg/dL 0.69   0.72   0.67    Sodium 135 - 145 mmol/L 138   139   139    Potassium 3.5 - 5.1 mmol/L 4.4   4.8   4.5    Chloride 98 - 111 mmol/L 104   102   107    CO2 22 - 32 mmol/L _1 Calcium 8.9 - 10.3 mg/dL 9.8   9.9   9.3    Total Protein 6.5 - 8.1 g/dL 7.6   8.0   8.1    Total Bilirubin 0.3 - 1.2 mg/dL 0.4   0.3   0.4    Alkaline Phos 38 - 126 U/L  114    137    AST 15 - 41 U/L _0 ALT 0 - 44 U/L _1 Lab Results  Component Value Date   MPROTEIN 1.2 (H) 11/28/2021   MPROTEIN 0.6 (H) 10/12/2020   Lab Results  Component Value Date   KPAFRELGTCHN 76.5 (H) 05/29/2022   KPAFRELGTCHN 104.4 (H) 11/28/2021   KPAFRELGTCHN 49.3 (H) 10/12/2020   LAMBDASER 28.1 (H) 05/29/2022   LAMBDASER 45.2 (H) 11/28/2021   LAMBDASER 20.3 10/12/2020   KAPLAMBRATIO 2.72 (H) 05/29/2022   KAPLAMBRATIO 2.31 (H) 11/28/2021   KAPLAMBRATIO 19.80 11/05/2020    RADIOGRAPHIC STUDIES: No results found.  ASSESSMENT & PLAN Abigail Lamb 71 y.o. female with medical history significant for IgM kappa monoclonal gammopathy who presents for a follow up visit.    After review the labs, review the records, discussion the patient the findings are most consistent with a monoclonal myopathy of undetermined significance.  She underwent bone marrow biopsy  which did show a 5% plasma cell population, however this was polytypic in nature.  As such the patient does not require any treatment but only observation from this point moving forward.  The only missing piece of her work-up at this time is her metastatic survey which she requested be performed after December 29, 2020 (but was lost to follow up).  Overall she is at her baseline level of health at this time with no other issues that need addressed.  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 No CRAB criteria met. Bone marrow biopsy on 10/24/2020 revealed normocellular marrow with polytypic plasmacytosis (5%).  --assure yearly metastatic survey and UPEP. Testing due in Dec 2023.  --assure SPEP, SFLC, CMP, CBC, and LDH at each visit --RTC q 6 months.   #Leukocytosis- improving #Thrombocytosis-resolved --WBC 11.8, Plt 269 --recheck and evaluated on labs today.  --continue to monitor   No orders of the defined types were placed in this encounter.   All questions were answered. The patient knows to call the clinic with any problems, questions or concerns.  A total of more than 30 minutes were spent on this encounter and over half of that time was spent on counseling and coordination of care as outlined above.   Ledell Peoples, MD Department of Hematology/Oncology [place] [facility] Phone: [phone] Pager: [phone] Email: [email or web address]  06/03/2022 2:35 PM    Literature Support:  1) Kyle RA, Durie BG, Rajkumar SV, et al. Monoclonal gammopathy of undetermined significance (MGUS) and smoldering (asymptomatic) multiple myeloma: IMWG consensus perspectives risk factors for progression and guidelines for monitoring and management. Leukemia. 2010;24(6):1121-1127. doi:10.1038/leu.2010.60   --If a patient with apparent MGUS has a serum monoclonal protein >15 g/l, IgA or IgM  protein type, or an abnormal FLC ratio, a BM aspirate and biopsy should be carried out at baseline to rule out underlying PC malignancy.   2) Marylyn Ishihara RA, Therneau TM, Rajkumar SV, et al. Prevalence of monoclonal gammopathy of undetermined significance. N Engl J Med 0254;270:6237-6283   --MGUS was found in 3.2 percent of persons 66 years of age or older and 5.3 percent of persons 49 years of age or older.

## 2022-05-30 ENCOUNTER — Other Ambulatory Visit: Payer: Self-pay | Admitting: Internal Medicine

## 2022-05-30 DIAGNOSIS — K582 Mixed irritable bowel syndrome: Secondary | ICD-10-CM

## 2022-05-30 LAB — KAPPA/LAMBDA LIGHT CHAINS
Kappa free light chain: 76.5 mg/L — ABNORMAL HIGH (ref 3.3–19.4)
Kappa, lambda light chain ratio: 2.72 — ABNORMAL HIGH (ref 0.26–1.65)
Lambda free light chains: 28.1 mg/L — ABNORMAL HIGH (ref 5.7–26.3)

## 2022-05-30 LAB — BETA 2 MICROGLOBULIN, SERUM: Beta-2 Microglobulin: 2.2 mg/L (ref 0.6–2.4)

## 2022-05-30 NOTE — Telephone Encounter (Signed)
Called patient to determine if she received Forteo shipment and if so, if she's started therapy. Unable to reach - left VM requesting return call for update  Knox Saliva, PharmD, MPH, BCPS, CPP Clinical Pharmacist (Rheumatology and Pulmonology)

## 2022-06-03 LAB — MULTIPLE MYELOMA PANEL, SERUM
Albumin SerPl Elph-Mcnc: 3.2 g/dL (ref 2.9–4.4)
Albumin/Glob SerPl: 0.9 (ref 0.7–1.7)
Alpha 1: 0.3 g/dL (ref 0.0–0.4)
Alpha2 Glob SerPl Elph-Mcnc: 0.7 g/dL (ref 0.4–1.0)
B-Globulin SerPl Elph-Mcnc: 1.1 g/dL (ref 0.7–1.3)
Gamma Glob SerPl Elph-Mcnc: 1.8 g/dL (ref 0.4–1.8)
Globulin, Total: 3.9 g/dL (ref 2.2–3.9)
IgA: 651 mg/dL — ABNORMAL HIGH (ref 87–352)
IgG (Immunoglobin G), Serum: 1208 mg/dL (ref 586–1602)
IgM (Immunoglobulin M), Srm: 777 mg/dL — ABNORMAL HIGH (ref 26–217)
M Protein SerPl Elph-Mcnc: 0.6 g/dL — ABNORMAL HIGH
Total Protein ELP: 7.1 g/dL (ref 6.0–8.5)

## 2022-06-04 MED ORDER — FORTEO 600 MCG/2.4ML ~~LOC~~ SOPN: 20.0000 ug | PEN_INJECTOR | Freq: Every day | SUBCUTANEOUS | 0 refills | Status: DC

## 2022-06-04 NOTE — Progress Notes (Signed)
Synopsis: Referred for cough by Ofilia Neas, PA-C  Subjective:   PATIENT ID: Abigail Lamb GENDER: female DOB: 09-20-1951, MRN: [id number]  Chief Complaint  Patient presents with   Pulmonary Consult    Referred by Hazel Sams, PA. Pt c/o SOB over the past 2 years. She has some PND and cough. Cough is occ prod with clear sputum.    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 Otherwise typically doesn't get cough.   Does have DOE and uses albuterol as needed. She has tried spiriva in past, breo. She does think they were helpful for her breathing. She has not needed any steroids this year for breathing. Was last on prednisone for RA 2-3 ya.   REcurrent infections have limited use of biologics. She was also on MTX from 2007-2021.   She occasionally has trouble swallowing food/liquid but minimizes importance of this.   Otherwise pertinent review of systems is negative.  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   Past Medical History:  Diagnosis Date   Celiac disease    GERD (gastroesophageal reflux disease)    MGUS (monoclonal gammopathy of unknown significance)    per patient    Osteoporosis    per patient, diagnosed by Dr. Shona Simpson in [facility]   PUD (peptic ulcer disease)    Pulmonary emphysema ([place])    Recurrent depression ([place])      Family History  Problem Relation Age of Onset   CVA Mother    CAD Mother    Diabetes Mother    COPD Mother    Fibromyalgia Mother    Depression Mother    Hypertension Mother    Emphysema Mother        smoked   Heart attack Mother    Prostate cancer Father    Cancer - Prostate Father    Rheum arthritis Sister    Skin cancer Brother    Kidney Stones Brother    Hernia Brother    Depression  Daughter    Healthy Son    Healthy Son      Past Surgical History:  Procedure Laterality Date   ABCESS DRAINAGE Left    under left arm   TONSILLECTOMY  1977    Social History   Socioeconomic History   Marital status: Divorced    Spouse name: Not on file   Number of children: Not on file   Years of education: Not on file   Highest education level: Not on file  Occupational History   Not on file  Tobacco Use   Smoking status: Every Day    Packs/day: 2.00    Years: 49.00    Total pack years: 98.00    Types: Cigarettes    Passive exposure: Never   Smokeless tobacco: Never   Tobacco comments:    Currently 1/2- 1 ppd 06/05/22 [place], CMA   Vaping Use   Vaping Use: Never used  Substance and Sexual Activity   Alcohol use: Not Currently   Drug use: Never   Sexual activity: Not Currently  Other Topics Concern   Not on file  Social History Narrative   Not on file   Social Determinants of Health   Financial Resource  Strain: Low Risk  (03/11/2022)   Overall Financial Resource Strain (CARDIA)    Difficulty of Paying Living Expenses: Not hard at all  Food Insecurity: No Food Insecurity (03/11/2022)   Hunger Vital Sign    Worried About Running Out of Food in the Last Year: Never true    Ran Out of Food in the Last Year: Never true  Transportation Needs: No Transportation Needs (03/11/2022)   PRAPARE - Hydrologist (Medical): No    Lack of Transportation (Non-Medical): No  Physical Activity: Inactive (03/11/2022)   Exercise Vital Sign    Days of Exercise per Week: 0 days    Minutes of Exercise per Session: 0 min  Stress: No Stress Concern Present (03/11/2022)   [place]    Feeling of Stress : Not at all  Social Connections: Socially Isolated (03/11/2022)   Social Connection and Isolation Panel [NHANES]    Frequency of Communication with Friends and Family: More than three  times a week    Frequency of Social Gatherings with Friends and Family: Twice a week    Attends Religious Services: Never    Marine scientist or Organizations: No    Attends Archivist Meetings: Never    Marital Status: Divorced  Human resources officer Violence: Not At Risk (03/11/2022)   Humiliation, Afraid, Rape, and Kick questionnaire    Fear of Current or Ex-Partner: No    Emotionally Abused: No    Physically Abused: No    Sexually Abused: No     Allergies  Allergen Reactions   Dust Mite Extract     Multiple environmental allergies.      Outpatient Medications Prior to Visit  Medication Sig Dispense Refill   albuterol (VENTOLIN HFA) 108 (90 Base) MCG/ACT inhaler Take 1-2 puffs every 4-6 hrs prn shortness of breath. Needs ventolin.     celecoxib (CELEBREX) 50 MG capsule TAKE 1 CAPSULE BY MOUTH 2 TIMES DAILY. 60 capsule 3   Cholecalciferol (VITAMIN D3) 125 MCG (5000 UT) CAPS Take 5,000 Units by mouth daily.     clindamycin (CLEOCIN T) 1 % external solution as needed.      colesevelam (WELCHOL) 625 MG tablet TAKE 3 TABLETS BY MOUTH TWICE A DAY WITH MEAL 180 tablet 1   dicyclomine (BENTYL) 10 MG capsule TAKE 1 CAPSULE (10 MG TOTAL) BY MOUTH 4 TIMES A DAY BEFORE MEALS AND AT BEDTIME 270 capsule 0   dronabinol (MARINOL) 2.5 MG capsule TAKE 1 CAPSULE BY MOUTH 2 TIMES DAILY BEFORE LUNCH AND SUPPER 180 capsule 0   DULoxetine (CYMBALTA) 60 MG capsule TAKE 1 CAPSULE(60 MG) BY MOUTH DAILY 90 capsule 1   famotidine (PEPCID) 40 MG tablet Take 1 tablet (40 mg total) by mouth 2 (two) times daily. 180 tablet 1   fluocinonide-emollient (LIDEX-E) 0.05 % cream Apply 1 application topically 2 (two) times daily. 60 g 3   folic acid (FOLVITE) 1 MG tablet Take by mouth.     hydroxychloroquine (PLAQUENIL) 200 MG tablet TAKE 1 TABLET(200 MG) BY MOUTH TWICE DAILY 180 tablet 0   hydrOXYzine (ATARAX) 10 MG tablet TAKE 1 TABLET BY MOUTH EVERY 8 HOURS AS NEEDED. 270 tablet 0   Insulin Pen Needle 31G  X 5 MM MISC Use to inject Forteo once daily. DO NOT REUSE NEEDLE. 100 each 2   loratadine (CLARITIN) 10 MG tablet Take 10 mg by mouth daily.     ondansetron (ZOFRAN) 4  MG tablet TAKE 1 TABLET BY MOUTH EVERY 8 HOURS AS NEEDED FOR NAUSEA AND VOMITING 40 tablet 2   Teriparatide, Recombinant, (FORTEO) 600 MCG/2.4ML SOPN Inject 20 mcg into the skin daily. DISCARD AFTER 28 days 9.6 mL 0   TUBERCULIN SYR 1CC/25GX5/8" 25G X 5/8" 1 ML MISC Use 1 syringe weekly for Methotrexate injection. 12 each 3   Pitavastatin Calcium (LIVALO) 1 MG TABS Take 1 tablet (1 mg total) by mouth daily. (Patient not taking: Reported on 06/05/2022) 90 tablet 1   No facility-administered medications prior to visit.       Objective:   Physical Exam:  General appearance: 71 y.o., female, NAD, conversant, cachectic Eyes: anicteric sclerae; PERRL, tracking appropriately HENT: NCAT; MMM Neck: Trachea midline; no lymphadenopathy, no JVD Lungs: diminished bl, no crackles, no wheeze, with normal respiratory effort CV: RRR, no murmur  Abdomen: Soft, non-tender; non-distended, BS present  Extremities: No peripheral edema, warm. +RA changes Skin: Normal turgor and texture; no rash Psych: Appropriate affect Neuro: Alert and oriented to person and place, no focal deficit     Vitals:   06/05/22 1519  BP: 122/70  Pulse: 90  Temp: 98.3 F (36.8 C)  TempSrc: Oral  SpO2: 95%  Weight: 78 lb 3.2 oz (35.5 kg)  Height: '5\' 2"'$  (1.575 m)   95% on RA BMI Readings from Last 3 Encounters:  06/05/22 14.30 kg/m  05/29/22 14.30 kg/m  05/12/22 14.23 kg/m   Wt Readings from Last 3 Encounters:  06/05/22 78 lb 3.2 oz (35.5 kg)  05/29/22 78 lb 3.2 oz (35.5 kg)  05/12/22 77 lb 12.8 oz (35.3 kg)     CBC    Component Value Date/Time   WBC 11.8 (H) 05/29/2022 1320   WBC 14.4 (H) 02/11/2022 1403   RBC 4.43 05/29/2022 1320   HGB 13.6 05/29/2022 1320   HCT 40.5 05/29/2022 1320   PLT 269 05/29/2022 1320   MCV 91.4 05/29/2022  1320   MCH 30.7 05/29/2022 1320   MCHC 33.6 05/29/2022 1320   RDW 14.1 05/29/2022 1320   LYMPHSABS 2.7 05/29/2022 1320   MONOABS 0.7 05/29/2022 1320   EOSABS 0.2 05/29/2022 1320   BASOSABS 0.1 05/29/2022 1320    Chest Imaging: CT Chest 11/2021 reviewed by me with 25m RLL nodule, severe emphysema  Pulmonary Functions Testing Results:     No data to display             Assessment & Plan:   # Suspected COPD # Severe emphysema  # Dominant RLL 79mpulmonary nodule # Scattered TIB nodularity  # Smoking Will qualify for lung cancer screening, discuss along with options to support cessation at next visit  Plan: - HIV, A1AT, AFB sputum, sputum culture - start stiolto 2 puffs daily - after stiolto flutter valve 10 slow but firm puffs once daily to see if it helps you cough up sputum - PFTs - super D CT Chest and then consideration of bronch/BAL for NTM if sputum cultures above unrevealing or if pt unable to produce adequate specimen, consideration of robotic nav if RLL nodule with concerning change - revisit smoking cessation next visit - see you in 4 weeks!  RTC 4 weeks   NaMaryjane HurterMD [facility] 06/05/2022 3:26 PM

## 2022-06-04 NOTE — Telephone Encounter (Signed)
Called patient to determine if she received Forteo. She states she started on 05/29/22. She has experienced mild dizziness and tirednesss but is taking at bedtime to minimize risk of falls. She reports no other issues. Added Forteo onto medication list today.  Knox Saliva, PharmD, MPH, BCPS, CPP Clinical Pharmacist (Rheumatology and Pulmonology)

## 2022-06-05 ENCOUNTER — Telehealth: Payer: Self-pay | Admitting: Student

## 2022-06-05 ENCOUNTER — Encounter: Payer: Self-pay | Admitting: Student

## 2022-06-05 ENCOUNTER — Ambulatory Visit: Payer: Medicare Other | Admitting: Student

## 2022-06-05 VITALS — BP 122/70 | HR 90 | Temp 98.3°F | Ht 62.0 in | Wt 78.2 lb

## 2022-06-05 DIAGNOSIS — J432 Centrilobular emphysema: Secondary | ICD-10-CM

## 2022-06-05 DIAGNOSIS — R911 Solitary pulmonary nodule: Secondary | ICD-10-CM

## 2022-06-05 MED ORDER — STIOLTO RESPIMAT 2.5-2.5 MCG/ACT IN AERS: 2.0000 | INHALATION_SPRAY | Freq: Every day | RESPIRATORY_TRACT | 0 refills | Status: DC

## 2022-06-05 NOTE — Patient Instructions (Signed)
-   Labs today - try to collect 2 sputum samples and bring to lab - start stiolto 2 puffs daily - after stiolto flutter valve 10 slow but firm puffs once daily to see if it helps you cough up sputum - you will be called to schedule breathing tests and CT Chest - see you in 4 weeks!

## 2022-06-05 NOTE — Telephone Encounter (Signed)
What would be most affordable lama/lama for her?

## 2022-06-06 ENCOUNTER — Other Ambulatory Visit: Payer: Self-pay | Admitting: Student

## 2022-06-06 ENCOUNTER — Other Ambulatory Visit (HOSPITAL_COMMUNITY): Payer: Self-pay

## 2022-06-06 DIAGNOSIS — J432 Centrilobular emphysema: Secondary | ICD-10-CM

## 2022-06-06 LAB — HIV ANTIBODY (ROUTINE TESTING W REFLEX): HIV 1&2 Ab, 4th Generation: NONREACTIVE

## 2022-06-10 ENCOUNTER — Other Ambulatory Visit: Payer: Self-pay | Admitting: Internal Medicine

## 2022-06-10 DIAGNOSIS — R911 Solitary pulmonary nodule: Secondary | ICD-10-CM | POA: Insufficient documentation

## 2022-06-10 DIAGNOSIS — R918 Other nonspecific abnormal finding of lung field: Secondary | ICD-10-CM

## 2022-06-10 DIAGNOSIS — Z79899 Other long term (current) drug therapy: Secondary | ICD-10-CM | POA: Diagnosis not present

## 2022-06-11 NOTE — Telephone Encounter (Signed)
Patient states that she is receiving treatment with her Rheumatologist and does not need the Evenity.

## 2022-06-22 ENCOUNTER — Other Ambulatory Visit: Payer: Self-pay | Admitting: Internal Medicine

## 2022-06-22 DIAGNOSIS — L280 Lichen simplex chronicus: Secondary | ICD-10-CM

## 2022-06-22 LAB — ALPHA-1 ANTITRYPSIN PHENOTYPE: A-1 Antitrypsin, Ser: 223 mg/dL — ABNORMAL HIGH (ref 83–199)

## 2022-06-27 NOTE — Telephone Encounter (Signed)
Pt archived in [email or web address].  Please advise if patient and/or provider wish to proceed with EVENITY therpay.

## 2022-06-29 ENCOUNTER — Other Ambulatory Visit: Payer: Self-pay | Admitting: Internal Medicine

## 2022-06-29 DIAGNOSIS — K279 Peptic ulcer, site unspecified, unspecified as acute or chronic, without hemorrhage or perforation: Secondary | ICD-10-CM

## 2022-07-02 ENCOUNTER — Ambulatory Visit
Admission: RE | Admit: 2022-07-02 | Discharge: 2022-07-02 | Disposition: A | Discharge status: Home / Self Care | Payer: Medicare Other | Source: Ambulatory Visit | Attending: Student | Admitting: Student

## 2022-07-02 DIAGNOSIS — R911 Solitary pulmonary nodule: Secondary | ICD-10-CM | POA: Diagnosis not present

## 2022-07-02 DIAGNOSIS — J439 Emphysema, unspecified: Secondary | ICD-10-CM | POA: Diagnosis not present

## 2022-07-02 DIAGNOSIS — R918 Other nonspecific abnormal finding of lung field: Secondary | ICD-10-CM | POA: Diagnosis not present

## 2022-07-02 DIAGNOSIS — J432 Centrilobular emphysema: Secondary | ICD-10-CM

## 2022-07-09 ENCOUNTER — Other Ambulatory Visit: Payer: Self-pay | Admitting: Internal Medicine

## 2022-07-09 ENCOUNTER — Encounter: Payer: Self-pay | Admitting: Internal Medicine

## 2022-07-09 DIAGNOSIS — J431 Panlobular emphysema: Secondary | ICD-10-CM

## 2022-07-09 DIAGNOSIS — J449 Chronic obstructive pulmonary disease, unspecified: Secondary | ICD-10-CM

## 2022-07-11 ENCOUNTER — Other Ambulatory Visit: Payer: Self-pay | Admitting: Internal Medicine

## 2022-07-11 DIAGNOSIS — E785 Hyperlipidemia, unspecified: Secondary | ICD-10-CM

## 2022-07-18 DIAGNOSIS — Z79899 Other long term (current) drug therapy: Secondary | ICD-10-CM | POA: Diagnosis not present

## 2022-07-23 ENCOUNTER — Telehealth: Payer: Self-pay | Admitting: Hematology and Oncology

## 2022-07-23 NOTE — Telephone Encounter (Signed)
.  Called patient to schedule appointment per 5/19 inbasket, patient is aware of date and time.   

## 2022-07-24 ENCOUNTER — Ambulatory Visit (INDEPENDENT_AMBULATORY_CARE_PROVIDER_SITE_OTHER): Payer: Medicare Other | Admitting: Student

## 2022-07-24 ENCOUNTER — Ambulatory Visit: Payer: Medicare Other | Admitting: Student

## 2022-07-24 DIAGNOSIS — J432 Centrilobular emphysema: Secondary | ICD-10-CM

## 2022-07-24 LAB — PULMONARY FUNCTION TEST
DL/VA % pred: 56 %
DL/VA: 2.37 ml/min/mmHg/L
DLCO cor % pred: 51 %
DLCO cor: 9.42 ml/min/mmHg
DLCO unc % pred: 51 %
DLCO unc: 9.47 ml/min/mmHg
FEF 25-75 Post: 0.6 L/sec
FEF 25-75 Pre: 0.55 L/sec
FEF2575-%Change-Post: 10 %
FEF2575-%Pred-Post: 32 %
FEF2575-%Pred-Pre: 29 %
FEV1-%Change-Post: 3 %
FEV1-%Pred-Post: 62 %
FEV1-%Pred-Pre: 60 %
FEV1-Post: 1.32 L
FEV1-Pre: 1.28 L
FEV1FVC-%Change-Post: 4 %
FEV1FVC-%Pred-Pre: 76 %
FEV6-%Change-Post: -2 %
FEV6-%Pred-Post: 78 %
FEV6-%Pred-Pre: 79 %
FEV6-Post: 2.08 L
FEV6-Pre: 2.13 L
FEV6FVC-%Change-Post: -1 %
FEV6FVC-%Pred-Post: 100 %
FEV6FVC-%Pred-Pre: 102 %
FVC-%Change-Post: 0 %
FVC-%Pred-Post: 77 %
FVC-%Pred-Pre: 78 %
FVC-Post: 2.17 L
FVC-Pre: 2.19 L
Post FEV1/FVC ratio: 61 %
Post FEV6/FVC ratio: 96 %
Pre FEV1/FVC ratio: 58 %
Pre FEV6/FVC Ratio: 97 %
RV % pred: 132 %
RV: 2.75 L
TLC % pred: 101 %
TLC: 4.84 L

## 2022-07-24 NOTE — Progress Notes (Signed)
Full PFT Performed Today  

## 2022-07-24 NOTE — Patient Instructions (Signed)
Full PFT Performed Today  

## 2022-08-01 NOTE — Progress Notes (Signed)
Office Visit Note  Patient: Abigail Lamb             Date of Birth: 1951/03/09           MRN: [id number]             PCP: Janith Lima, MD Referring: Janith Lima, MD Visit Date: 08/15/2022 Occupation: @GUAROCC @  Subjective:  Pain and stiffness in multiple joints  History of Present Illness: Abigail Lamb is a 71 y.o. female history of seropositive rheumatoid arthritis, osteoarthritis and osteoporosis.  She states she has been taking hydroxychloroquine 200 mg twice daily without any side effects.  She also takes Celebrex 500 mg p.o. twice daily which gives her some relief.  She continues to have pain and swelling in her joints.  She states she had taken methotrexate in the past but had to discontinue it due to frequent infections.  She was evaluated by the pulmonologist on June 05, 2022.  She was diagnosed with COPD and sputum cultures were taken.  The results are pending.  Activities of Daily Living:  Patient reports morning stiffness for all day. Patient Reports nocturnal pain.  Difficulty dressing/grooming: Reports Difficulty climbing stairs: Reports Difficulty getting out of chair: Reports Difficulty using hands for taps, buttons, cutlery, and/or writing: Reports  Review of Systems  Constitutional:  Positive for fatigue.  HENT:  Positive for mouth dryness. Negative for mouth sores.   Eyes:  Positive for dryness.  Respiratory:  Positive for shortness of breath.   Cardiovascular:  Positive for chest pain. Negative for palpitations.  Gastrointestinal:  Negative for blood in stool, constipation and diarrhea.  Endocrine: Positive for increased urination.  Genitourinary:  Positive for difficulty urinating. Negative for involuntary urination.  Musculoskeletal:  Positive for joint pain, gait problem, joint pain, joint swelling, myalgias, muscle weakness, morning stiffness, muscle tenderness and myalgias.  Skin:  Negative for color change, rash, hair loss and sensitivity to  sunlight.  Allergic/Immunologic: Positive for susceptible to infections.  Neurological:  Positive for dizziness. Negative for headaches.  Hematological:  Negative for swollen glands.  Psychiatric/Behavioral:  Positive for depressed mood and sleep disturbance. The patient is nervous/anxious.     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 ([place]) 11/08/2021   Gastroparesis 11/07/2021   Irritable bowel syndrome with both constipation and diarrhea 11/07/2021   Encounter for general adult medical examination with abnormal findings 11/07/2021   Leukocytosis 07/68/0881   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   Routine general medical examination at a health care facility 08/02/2020   Visit for screening mammogram 08/02/2020   Rheumatoid arthritis involving multiple sites with positive rheumatoid factor ([place]) 08/02/2020   Hyperlipidemia LDL goal <130 08/02/2020   Tobacco abuse 08/02/2020   Dupuytren's contracture of right hand 08/02/2020   Pulmonary emphysema (HCC)    Stage 2 moderate COPD by GOLD classification ([place]) 01/30/2020   Neurodermatitis 03/28/2019   Primary insomnia 03/28/2019   Recurrent depression ([place]) 03/28/2019    Past Medical History:  Diagnosis Date   Celiac disease    GERD (gastroesophageal reflux disease)    MGUS (monoclonal gammopathy of unknown significance)    per patient    Osteoporosis    per patient, diagnosed by Dr. Shona Simpson in [facility]   PUD (peptic ulcer disease)    Pulmonary emphysema ([place])    Recurrent depression (  [place])     Family History  Problem Relation Age of Onset   CVA Mother    CAD Mother    Diabetes Mother    COPD Mother    Fibromyalgia Mother    Depression Mother    Hypertension Mother    Emphysema Mother        smoked   Heart attack Mother    Prostate  cancer Father    Cancer - Prostate Father    Rheum arthritis Sister    Skin cancer Brother    Kidney Stones Brother    Hernia Brother    Depression Daughter    Healthy Son    Healthy Son    Past Surgical History:  Procedure Laterality Date   ABCESS DRAINAGE Left    under left arm   TONSILLECTOMY  1977   Social History   Social History Narrative   Not on file   Immunization History  Administered Date(s) Administered   Fluad Quad(high Dose 65+) 11/08/2021   Influenza, High Dose Seasonal PF 12/30/2019   Influenza-Unspecified 01/04/2018, 12/15/2018, 09/11/2020   PNEUMOCOCCAL CONJUGATE-20 11/08/2021   Pneumococcal Polysaccharide-23 08/02/2020   Tdap 11/05/2020     Objective: Vital Signs: BP 107/71 (BP Location: Left Arm, Patient Position: Sitting, Cuff Size: Normal)   Pulse (!) 102   Resp 18   Ht 5' 2"  (1.575 m)   Wt 78 lb 3.2 oz (35.5 kg)   BMI 14.30 kg/m    Physical Exam Vitals and nursing note reviewed.  Constitutional:      Appearance: She is well-developed.  HENT:     Head: Normocephalic and atraumatic.  Eyes:     Conjunctiva/sclera: Conjunctivae normal.  Cardiovascular:     Rate and Rhythm: Normal rate and regular rhythm.     Heart sounds: Normal heart sounds.  Pulmonary:     Effort: Pulmonary effort is normal.     Breath sounds: Normal breath sounds.  Abdominal:     General: Bowel sounds are normal.     Palpations: Abdomen is soft.  Musculoskeletal:     Cervical back: Normal range of motion.  Lymphadenopathy:     Cervical: No cervical adenopathy.  Skin:    General: Skin is warm and dry.     Capillary Refill: Capillary refill takes less than 2 seconds.  Neurological:     Mental Status: She is alert and oriented to person, place, and time.  Psychiatric:        Behavior: Behavior normal.      Musculoskeletal Exam: She had limited lateral rotation of the cervical spine.  Thoracic kyphosis was noted.  Shoulder joints and elbow joints with good range  of motion.  Nodulosis were noted over bilateral elbows.  She had synovitis and limited range of motion of bilateral wrist joints.  She had synovial thickening and synovitis of her bilateral MCP joints with subluxation and ulnar deviation of the MCPs of the right hand.  Hip joints and knee joints with good range of motion.  She had no tenderness over ankles or MTPs.  CDAI Exam: CDAI Score: 15.4  Patient Global: 7 mm; Provider Global: 7 mm Swollen: 6 ; Tender: 8  Joint Exam 08/15/2022      Right  Left  Wrist  Swollen Tender  Swollen Tender  MCP 2  Swollen Tender  Swollen Tender  MCP 3  Swollen Tender  Swollen Tender  Knee   Tender   Tender     Investigation: No additional findings.  Imaging:  No results found.  Recent Labs: Lab Results  Component Value Date   WBC 11.8 (H) 05/29/2022   HGB 13.6 05/29/2022   PLT 269 05/29/2022   NA 138 05/29/2022   K 4.4 05/29/2022   CL 104 05/29/2022   CO2 26 05/29/2022   GLUCOSE 107 (H) 05/29/2022   BUN 15 05/29/2022   CREATININE 0.69 05/29/2022   BILITOT 0.4 05/29/2022   ALKPHOS 114 05/29/2022   AST 23 05/29/2022   ALT 14 05/29/2022   PROT 7.6 05/29/2022   ALBUMIN 3.5 05/29/2022   CALCIUM 9.8 05/29/2022   GFRAA 107 10/26/2020   QFTBGOLDPLUS NEGATIVE 09/17/2020    Speciality Comments: PLQ Eye Exam: 08/14/2022 WNL @ [facility] Follow up 1 year.   Humira and Orencia were discontinued in the past due to frequent infections per patient.  Forteo started 05/29/22  Procedures:  No procedures performed Allergies: Dust mite extract   Assessment / Plan:     Visit Diagnoses: Rheumatoid arthritis with rheumatoid factor of multiple sites without organ or systems involvement (HCC) - End-stage severe rheumatoid arthritis with nodulosis and multiple contractures, severe erosive changes noted in both hands and both feet : She continues to have severe rheumatoid arthritis involving her wrist and hands.  She has difficulty gripping objects  and doing routine activities.  She is currently on Plaquenil 200 mg p.o. twice daily and Celebrex 500 mg p.o. twice daily.  She could not tolerate methotrexate in the past due to frequent infections.  She was also on Orencia and methotrexate in the past which were discontinued due to frequent infections.  I discussed the option of adding sulfasalazine today.  She is hesitant to go on sulfasalazine due to GI side effects.  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   High risk medication use - Plaquenil 200 mg 1 tablet by mouth twice daily.  No longer taking prednisone.  She remains on Celebrex 500 mg 1 capsule twice daily prescribed by Dr. Ronnald Ramp.  Labs obtained on May 29, 2022 showed elevated white cell count of 11.8.  Hemoglobin and platelets were normal.  CMP was normal.  Eye exam on August 14, 2022 was within normal limits.  She was advised to get annual eye examination.  Primary osteoarthritis of both knees-she continues to have pain and discomfort in her bilateral knee joints.  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  She had recent significant weight loss.  She is followed by Dr. Lorenso Courier.   Age-related osteoporosis without current pathological fracture - Forteo injections started 05/29/22. DEXA updated on 02/07/2022: LFN BMD 0.440 with T score -3.7.  She was started on Forteo on May 29, 2022.  She has been taking daily injections and tolerating them well.  Use of calcium and vitamin D was discussed.  Vitamin D deficiency-her vitamin D was 55 on February 11, 2022.  She was advised to take vitamin D on a regular basis.  Pain in left hip-she had limited range of motion without discomfort today.  Stage 2 moderate COPD by GOLD classification (HCC)-she was recently evaluated by the pulmonologist.  Further work-up is pending.  Sputum cultures were obtained at the last visit.  She was diagnosed with COPD.  Lung nodules  Prediabetes  Recurrent depression ([place])  Kidney  stone  Tobacco abuse-smoking cessation was discussed.  Association of smoking with rheumatoid arthritis was discussed.  Hyperlipidemia LDL goal <130-dietary modifications were discussed.  Panlobular emphysema ([place])  Primary insomnia-good sleep hygiene was discussed.  Orders: No orders  of the defined types were placed in this encounter.  No orders of the defined types were placed in this encounter.    Follow-Up Instructions: Return in about 3 months (around 11/15/2022) for Rheumatoid arthritis, Osteoporosis.   Bo Merino, MD  Note - This record has been created using Editor, commissioning.  Chart creation errors have been sought, but may not always  have been located. Such creation errors do not reflect on  the standard of medical care.

## 2022-08-05 ENCOUNTER — Ambulatory Visit: Payer: Medicare Other | Admitting: Student

## 2022-08-10 ENCOUNTER — Other Ambulatory Visit: Payer: Self-pay | Admitting: Physician Assistant

## 2022-08-10 DIAGNOSIS — M0579 Rheumatoid arthritis with rheumatoid factor of multiple sites without organ or systems involvement: Secondary | ICD-10-CM

## 2022-08-11 NOTE — Telephone Encounter (Signed)
Next Visit: 08/15/2022  Last Visit: 05/12/2022  Labs: 05/29/2022 Glucose 107, WBC 11.8, Neutro Abs 8.2  Eye exam: No baseline Plaquenil eye examination is on file   Current Dose per office note 05/12/2022: Plaquenil 200 mg 1 tablet by mouth twice daily  DX: Rheumatoid arthritis with rheumatoid factor of multiple sites without organ or systems involvement   Last Fill: 05/12/2022  Left message to advise patient we need her PLQ eye exam. Patient advised to call the office to advise if she has had it completed or has the appointment scheduled.   Okay to refill Plaquenil?

## 2022-08-12 ENCOUNTER — Telehealth: Payer: Self-pay | Admitting: *Deleted

## 2022-08-12 NOTE — Telephone Encounter (Signed)
Spoke with patient and advised we are needing her PLQ eye exam. Patient states she has had it completed and she will reach out to the eye doctor and have them send results. Verfied fax number for patient.

## 2022-08-15 ENCOUNTER — Encounter: Payer: Self-pay | Admitting: Rheumatology

## 2022-08-15 ENCOUNTER — Ambulatory Visit: Payer: Medicare Other | Attending: Rheumatology | Admitting: Rheumatology

## 2022-08-15 VITALS — BP 107/71 | HR 102 | Resp 18 | Ht 62.0 in | Wt 78.2 lb

## 2022-08-15 DIAGNOSIS — Z79899 Other long term (current) drug therapy: Secondary | ICD-10-CM | POA: Diagnosis not present

## 2022-08-15 DIAGNOSIS — R7303 Prediabetes: Secondary | ICD-10-CM | POA: Diagnosis not present

## 2022-08-15 DIAGNOSIS — M170 Bilateral primary osteoarthritis of knee: Secondary | ICD-10-CM

## 2022-08-15 DIAGNOSIS — F339 Major depressive disorder, recurrent, unspecified: Secondary | ICD-10-CM

## 2022-08-15 DIAGNOSIS — M0579 Rheumatoid arthritis with rheumatoid factor of multiple sites without organ or systems involvement: Secondary | ICD-10-CM | POA: Diagnosis not present

## 2022-08-15 DIAGNOSIS — R918 Other nonspecific abnormal finding of lung field: Secondary | ICD-10-CM

## 2022-08-15 DIAGNOSIS — M0630 Rheumatoid nodule, unspecified site: Secondary | ICD-10-CM | POA: Diagnosis not present

## 2022-08-15 DIAGNOSIS — E559 Vitamin D deficiency, unspecified: Secondary | ICD-10-CM

## 2022-08-15 DIAGNOSIS — J431 Panlobular emphysema: Secondary | ICD-10-CM

## 2022-08-15 DIAGNOSIS — N200 Calculus of kidney: Secondary | ICD-10-CM

## 2022-08-15 DIAGNOSIS — M810 Age-related osteoporosis without current pathological fracture: Secondary | ICD-10-CM | POA: Diagnosis not present

## 2022-08-15 DIAGNOSIS — D472 Monoclonal gammopathy: Secondary | ICD-10-CM | POA: Diagnosis not present

## 2022-08-15 DIAGNOSIS — E785 Hyperlipidemia, unspecified: Secondary | ICD-10-CM

## 2022-08-15 DIAGNOSIS — J449 Chronic obstructive pulmonary disease, unspecified: Secondary | ICD-10-CM

## 2022-08-15 DIAGNOSIS — M25552 Pain in left hip: Secondary | ICD-10-CM | POA: Diagnosis not present

## 2022-08-15 DIAGNOSIS — F5101 Primary insomnia: Secondary | ICD-10-CM

## 2022-08-15 DIAGNOSIS — Z720 Tobacco use: Secondary | ICD-10-CM

## 2022-08-15 NOTE — Patient Instructions (Signed)
Standing Labs We placed an order today for your standing lab work.   Please have your standing labs drawn in October  If possible, please have your labs drawn 2 weeks prior to your appointment so that the provider can discuss your results at your appointment.  Please note that you may see your imaging and lab results in [place] before we have reviewed them. We may be awaiting multiple results to interpret others before contacting you. Please allow our office up to 72 hours to thoroughly review all of the results before contacting the office for clarification of your results.  We currently have open lab daily: Monday through Thursday from 1:30 PM-4:30 PM and Friday from 1:30 PM- 4:00 PM If possible, please come for your lab work on Monday, Thursday or Friday afternoons, as you may experience shorter wait times.   Effective October 29, 2022 the new lab hours will change to: Monday through Thursday from 1:30 PM-5:00 PM and Friday from 8:30 AM-12:00 PM If possible, please come for your lab work on Monday and Thursday afternoons, as you may experience shorter wait times.  Please be advised, all patients with office appointments requiring lab work will take precedent over walk-in lab work.    The office is located at [address], [place], [place], [place] [address] No appointment is necessary.   Labs are drawn by [facility]. Please bring your co-pay at the time of your lab draw.  You may receive a bill from [facility] for your lab work.  Please note if you are on Hydroxychloroquine and and an order has been placed for a Hydroxychloroquine level, you will need to have it drawn 4 hours or more after your last dose.  If you wish to have your labs drawn at another location, please call the office 24 hours in advance to send orders.  If you have any questions regarding directions or hours of operation,  please call [phone].   As a reminder, please drink plenty of water prior to coming for your  lab work. Thanks!   Vaccines You are taking a medication(s) that can suppress your immune system.  The following immunizations are recommended: Flu annually Covid-19  Td/Tdap (tetanus, diphtheria, pertussis) every 10 years Pneumonia (Prevnar 15 then Pneumovax 23 at least 1 year apart.  Alternatively, can take Prevnar 20 without needing additional dose) Shingrix: 2 doses from 4 weeks to 6 months apart  Please check with your PCP to make sure you are up to date.

## 2022-08-17 ENCOUNTER — Other Ambulatory Visit: Payer: Self-pay | Admitting: Internal Medicine

## 2022-08-17 DIAGNOSIS — E785 Hyperlipidemia, unspecified: Secondary | ICD-10-CM

## 2022-08-18 NOTE — Progress Notes (Unsigned)
Synopsis: Referred for cough by Janith Lima, MD  Subjective:   PATIENT ID: Abigail Lamb GENDER: female DOB: [birth year], MRN: [id number]  No chief complaint on file.  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 Otherwise typically doesn't get cough.   Does have DOE and uses albuterol as needed. She has tried spiriva in past, breo. She does think they were helpful for her breathing. She has not needed any steroids this year for breathing. Was last on prednisone for RA 2-3 ya.   REcurrent infections have limited use of biologics. She was also on MTX from 2007-2021.   She occasionally has trouble swallowing food/liquid but minimizes importance of this.   Otherwise pertinent review of systems is negative.  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   Interval HPI: Started on stiolto, flutter, sputum cultures not obtained. CT Chest looks better.  Past Medical History:  Diagnosis Date   Celiac disease    GERD (gastroesophageal reflux disease)    MGUS (monoclonal gammopathy of unknown significance)    per patient    Osteoporosis    per patient, diagnosed by Dr. Shona Simpson in [facility]   PUD (peptic ulcer disease)    Pulmonary emphysema ([place])    Recurrent depression ([place])      Family History  Problem Relation Age of Onset   CVA Mother    CAD Mother    Diabetes Mother    COPD Mother    Fibromyalgia Mother    Depression Mother    Hypertension Mother    Emphysema Mother        smoked   Heart attack Mother    Prostate cancer Father    Cancer - Prostate Father    Rheum arthritis Sister    Skin cancer Brother    Kidney Stones Brother    Hernia Brother    Depression Daughter    Healthy Son    Healthy Son      Past Surgical  History:  Procedure Laterality Date   ABCESS DRAINAGE Left    under left arm   TONSILLECTOMY  1977    Social History   Socioeconomic History   Marital status: Divorced    Spouse name: Not on file   Number of children: Not on file   Years of education: Not on file   Highest education level: Not on file  Occupational History   Not on file  Tobacco Use   Smoking status: Every Day    Packs/day: 0.50    Years: 49.00    Total pack years: 24.50    Types: Cigarettes    Passive exposure: Current   Smokeless tobacco: Never   Tobacco comments:    Currently 1/2- 1 ppd 06/05/22 [place], CMA   Vaping Use   Vaping Use: Never used  Substance and Sexual Activity   Alcohol use: Not Currently   Drug use: Never   Sexual activity: Not Currently  Other Topics Concern   Not on file  Social History Narrative   Not on file   Social Determinants of Health   Financial Resource Strain: Low Risk  (03/11/2022)   Overall Financial Resource Strain (CARDIA)    Difficulty of Paying Living Expenses: Not  hard at all  Food Insecurity: No Food Insecurity (03/11/2022)   Hunger Vital Sign    Worried About Running Out of Food in the Last Year: Never true    Ran Out of Food in the Last Year: Never true  Transportation Needs: No Transportation Needs (03/11/2022)   PRAPARE - Hydrologist (Medical): No    Lack of Transportation (Non-Medical): No  Physical Activity: Inactive (03/11/2022)   Exercise Vital Sign    Days of Exercise per Week: 0 days    Minutes of Exercise per Session: 0 min  Stress: No Stress Concern Present (03/11/2022)   [place]    Feeling of Stress : Not at all  Social Connections: Socially Isolated (03/11/2022)   Social Connection and Isolation Panel [NHANES]    Frequency of Communication with Friends and Family: More than three times a week    Frequency of Social Gatherings with Friends  and Family: Twice a week    Attends Religious Services: Never    Marine scientist or Organizations: No    Attends Archivist Meetings: Never    Marital Status: Divorced  Human resources officer Violence: Not At Risk (03/11/2022)   Humiliation, Afraid, Rape, and Kick questionnaire    Fear of Current or Ex-Partner: No    Emotionally Abused: No    Physically Abused: No    Sexually Abused: No     Allergies  Allergen Reactions   Dust Mite Extract     Multiple environmental allergies.      Outpatient Medications Prior to Visit  Medication Sig Dispense Refill   albuterol (VENTOLIN HFA) 108 (90 Base) MCG/ACT inhaler Take 1-2 puffs every 4-6 hrs prn shortness of breath. Needs ventolin.     celecoxib (CELEBREX) 50 MG capsule TAKE 1 CAPSULE BY MOUTH 2 TIMES DAILY. 60 capsule 3   Cholecalciferol (VITAMIN D3) 125 MCG (5000 UT) CAPS Take 5,000 Units by mouth daily.     clindamycin (CLEOCIN T) 1 % external solution as needed.      colesevelam (WELCHOL) 625 MG tablet TAKE 3 TABLETS BY MOUTH TWICE A DAY WITH MEAL 180 tablet 0   dicyclomine (BENTYL) 10 MG capsule TAKE 1 CAPSULE (10 MG TOTAL) BY MOUTH 4 TIMES A DAY BEFORE MEALS AND AT BEDTIME 270 capsule 0   dronabinol (MARINOL) 2.5 MG capsule TAKE 1 CAPSULE BY MOUTH 2 TIMES DAILY BEFORE LUNCH AND SUPPER 180 capsule 0   DULoxetine (CYMBALTA) 60 MG capsule TAKE 1 CAPSULE(60 MG) BY MOUTH DAILY 90 capsule 1   famotidine (PEPCID) 40 MG tablet TAKE 1 TABLET BY MOUTH TWICE A DAY 180 tablet 1   fluocinonide-emollient (LIDEX-E) 0.05 % cream Apply 1 application topically 2 (two) times daily. 60 g 3   folic acid (FOLVITE) 1 MG tablet Take by mouth.     hydroxychloroquine (PLAQUENIL) 200 MG tablet TAKE 1 TABLET BY MOUTH TWICE A DAY 60 tablet 0   hydrOXYzine (ATARAX) 10 MG tablet TAKE 1 TABLET BY MOUTH EVERY 8 HOURS AS NEEDED 270 tablet 0   Insulin Pen Needle 31G X 5 MM MISC Use to inject Forteo once daily. DO NOT REUSE NEEDLE. 100 each 2   loratadine  (CLARITIN) 10 MG tablet Take 10 mg by mouth daily.     ondansetron (ZOFRAN) 4 MG tablet TAKE 1 TABLET BY MOUTH EVERY 8 HOURS AS NEEDED FOR NAUSEA AND VOMITING 40 tablet 2   Teriparatide, Recombinant, (FORTEO) 600  MCG/2.4ML SOPN Inject 20 mcg into the skin daily. DISCARD AFTER 28 days 9.6 mL 0   Tiotropium Bromide-Olodaterol (STIOLTO RESPIMAT) 2.5-2.5 MCG/ACT AERS Inhale 2 puffs into the lungs daily. (Patient not taking: Reported on 08/15/2022) 4 g 0   TUBERCULIN SYR 1CC/25GX5/8" 25G X 5/8" 1 ML MISC Use 1 syringe weekly for Methotrexate injection. (Patient not taking: Reported on 08/15/2022) 12 each 3   No facility-administered medications prior to visit.       Objective:   Physical Exam:  General appearance: 71 y.o., female, NAD, conversant, cachectic Eyes: anicteric sclerae; PERRL, tracking appropriately HENT: NCAT; MMM Neck: Trachea midline; no lymphadenopathy, no JVD Lungs: diminished bl, no crackles, no wheeze, with normal respiratory effort CV: RRR, no murmur  Abdomen: Soft, non-tender; non-distended, BS present  Extremities: No peripheral edema, warm. +RA changes Skin: Normal turgor and texture; no rash Psych: Appropriate affect Neuro: Alert and oriented to person and place, no focal deficit     There were no vitals filed for this visit.    on RA BMI Readings from Last 3 Encounters:  08/15/22 14.30 kg/m  06/05/22 14.30 kg/m  05/29/22 14.30 kg/m   Wt Readings from Last 3 Encounters:  08/15/22 78 lb 3.2 oz (35.5 kg)  06/05/22 78 lb 3.2 oz (35.5 kg)  05/29/22 78 lb 3.2 oz (35.5 kg)     CBC    Component Value Date/Time   WBC 11.8 (H) 05/29/2022 1320   WBC 14.4 (H) 02/11/2022 1403   RBC 4.43 05/29/2022 1320   HGB 13.6 05/29/2022 1320   HCT 40.5 05/29/2022 1320   PLT 269 05/29/2022 1320   MCV 91.4 05/29/2022 1320   MCH 30.7 05/29/2022 1320   MCHC 33.6 05/29/2022 1320   RDW 14.1 05/29/2022 1320   LYMPHSABS 2.7 05/29/2022 1320   MONOABS 0.7 05/29/2022 1320    EOSABS 0.2 05/29/2022 1320   BASOSABS 0.1 05/29/2022 1320    Chest Imaging: CT Chest 11/2021 reviewed by me with 53m RLL nodule, severe emphysema  Super D CT Chest 07/03/22 reviewed by me with resolution dominant nodules, nearly resolved TIB nodularity  Pulmonary Functions Testing Results:    Latest Ref Rng & Units 07/24/2022    2:54 PM  PFT Results  FVC-Pre L 2.19   FVC-Predicted Pre % 78   FVC-Post L 2.17   FVC-Predicted Post % 77   Pre FEV1/FVC % % 58   Post FEV1/FCV % % 61   FEV1-Pre L 1.28   FEV1-Predicted Pre % 60   FEV1-Post L 1.32   DLCO uncorrected ml/min/mmHg 9.47   DLCO UNC% % 51   DLCO corrected ml/min/mmHg 9.42   DLCO COR %Predicted % 51   DLVA Predicted % 56   TLC L 4.84   TLC % Predicted % 101   RV % Predicted % 132        Assessment & Plan:   # Suspected COPD # Severe emphysema  # Dominant RLL 750mpulmonary nodule # Scattered TIB nodularity  # Smoking Will qualify for lung cancer screening, discuss along with options to support cessation at next visit  Plan: - HIV, A1AT, AFB sputum, sputum culture - start stiolto 2 puffs daily - after stiolto flutter valve 10 slow but firm puffs once daily to see if it helps you cough up sputum - PFTs - super D CT Chest and then consideration of bronch/BAL for NTM if sputum cultures above unrevealing or if pt unable to produce adequate specimen, consideration of robotic nav  if RLL nodule with concerning change - revisit smoking cessation next visit - see you in 4 weeks!  RTC 4 weeks   Maryjane Hurter, MD [facility] 08/18/2022 12:45 PM

## 2022-08-19 ENCOUNTER — Encounter: Payer: Self-pay | Admitting: Student

## 2022-08-19 ENCOUNTER — Ambulatory Visit: Payer: Medicare Other | Admitting: Student

## 2022-08-19 VITALS — BP 96/58 | HR 114 | Temp 98.2°F | Ht 62.0 in | Wt 77.4 lb

## 2022-08-19 DIAGNOSIS — F17200 Nicotine dependence, unspecified, uncomplicated: Secondary | ICD-10-CM

## 2022-08-19 DIAGNOSIS — J449 Chronic obstructive pulmonary disease, unspecified: Secondary | ICD-10-CM

## 2022-08-19 MED ORDER — VARENICLINE TARTRATE 1 MG PO TABS: 1.0000 mg | ORAL_TABLET | Freq: Two times a day (BID) | ORAL | 1 refills | Status: DC

## 2022-08-19 MED ORDER — ANORO ELLIPTA 62.5-25 MCG/ACT IN AEPB: 1.0000 | INHALATION_SPRAY | Freq: Every day | RESPIRATORY_TRACT | 0 refills | Status: DC

## 2022-08-19 MED ORDER — ANORO ELLIPTA 62.5-25 MCG/ACT IN AEPB
1.0000 | INHALATION_SPRAY | Freq: Every day | RESPIRATORY_TRACT | 11 refills | Status: DC
Start: 2022-08-19 — End: 2023-11-17

## 2022-08-19 MED ORDER — FLUTICASONE PROPIONATE 50 MCG/ACT NA SUSP: 1.0000 | Freq: Every day | NASAL | 11 refills | Status: AC

## 2022-08-19 MED ORDER — VARENICLINE TARTRATE 0.5 MG X 11 & 1 MG X 42 PO TBPK: ORAL_TABLET | ORAL | 0 refills | Status: DC

## 2022-08-19 NOTE — Patient Instructions (Addendum)
-   take shower, clear nose of crusting and then flonase 1 spray each nostril daily till next appointmentn - anoro 1 puff once daily  - stop stiolto - albuterol 1-2 puffs as needed  - start chantix: Take one 0.5 mg tablet by mouth once daily for 3 days, then increase to one 0.5 mg tablet twice daily for 4 days, then increase to one 1 mg tablet twice daily. Skip evening dose if you get nightmares. Can keep smoking first week then ideally stop.  - if chest congestion, productive cough, unintentional weight loss, drenching night sweats, persistent low grade fever or any combination of these then let us know by my chart message or phone call. We would then consider repeat CT Chest and potentially bronchoscopy and bronchoalveolar lavage - lung cancer screening referral placed today - see you in 3 months or sooner if need be!

## 2022-08-25 ENCOUNTER — Other Ambulatory Visit: Payer: Self-pay | Admitting: Internal Medicine

## 2022-08-25 DIAGNOSIS — E785 Hyperlipidemia, unspecified: Secondary | ICD-10-CM

## 2022-09-12 ENCOUNTER — Other Ambulatory Visit: Payer: Self-pay | Admitting: Physician Assistant

## 2022-09-12 DIAGNOSIS — M0579 Rheumatoid arthritis with rheumatoid factor of multiple sites without organ or systems involvement: Secondary | ICD-10-CM

## 2022-09-12 NOTE — Telephone Encounter (Signed)
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

## 2022-09-20 ENCOUNTER — Other Ambulatory Visit: Payer: Self-pay | Admitting: Internal Medicine

## 2022-09-30 ENCOUNTER — Encounter: Payer: Self-pay | Admitting: Internal Medicine

## 2022-10-18 ENCOUNTER — Other Ambulatory Visit: Payer: Self-pay | Admitting: Internal Medicine

## 2022-10-18 DIAGNOSIS — F3341 Major depressive disorder, recurrent, in partial remission: Secondary | ICD-10-CM

## 2022-10-27 ENCOUNTER — Ambulatory Visit (INDEPENDENT_AMBULATORY_CARE_PROVIDER_SITE_OTHER): Payer: Medicare Other | Admitting: Internal Medicine

## 2022-10-27 ENCOUNTER — Encounter: Payer: Self-pay | Admitting: Internal Medicine

## 2022-10-27 VITALS — BP 106/64 | HR 64 | Temp 97.6°F | Resp 16 | Ht 62.0 in | Wt 79.0 lb

## 2022-10-27 DIAGNOSIS — E785 Hyperlipidemia, unspecified: Secondary | ICD-10-CM | POA: Diagnosis not present

## 2022-10-27 DIAGNOSIS — M0579 Rheumatoid arthritis with rheumatoid factor of multiple sites without organ or systems involvement: Secondary | ICD-10-CM | POA: Diagnosis not present

## 2022-10-27 DIAGNOSIS — Z23 Encounter for immunization: Secondary | ICD-10-CM | POA: Diagnosis not present

## 2022-10-27 DIAGNOSIS — D72829 Elevated white blood cell count, unspecified: Secondary | ICD-10-CM | POA: Diagnosis not present

## 2022-10-27 LAB — CBC WITH DIFFERENTIAL/PLATELET
Basophils Absolute: 0.1 10*3/uL (ref 0.0–0.1)
Basophils Relative: 1 % (ref 0.0–3.0)
Eosinophils Absolute: 0.1 10*3/uL (ref 0.0–0.7)
Eosinophils Relative: 1.1 % (ref 0.0–5.0)
HCT: 41.8 % (ref 36.0–46.0)
Hemoglobin: 13.7 g/dL (ref 12.0–15.0)
Lymphocytes Relative: 21.9 % (ref 12.0–46.0)
Lymphs Abs: 2.6 10*3/uL (ref 0.7–4.0)
MCHC: 32.7 g/dL (ref 30.0–36.0)
MCV: 91 fl (ref 78.0–100.0)
Monocytes Absolute: 1 10*3/uL (ref 0.1–1.0)
Monocytes Relative: 8.5 % (ref 3.0–12.0)
Neutro Abs: 8.2 10*3/uL — ABNORMAL HIGH (ref 1.4–7.7)
Neutrophils Relative %: 67.5 % (ref 43.0–77.0)
Platelets: 325 10*3/uL (ref 150.0–400.0)
RBC: 4.6 Mil/uL (ref 3.87–5.11)
RDW: 13.8 % (ref 11.5–15.5)
WBC: 12.1 10*3/uL — ABNORMAL HIGH (ref 4.0–10.5)

## 2022-10-27 LAB — LIPID PANEL
Cholesterol: 164 mg/dL (ref 0–200)
HDL: 66.2 mg/dL (ref >39.00)
LDL Cholesterol: 71 mg/dL (ref 0–99)
NonHDL: 97.44
Total CHOL/HDL Ratio: 2
Triglycerides: 132 mg/dL (ref 0.0–149.0)
VLDL: 26.4 mg/dL (ref 0.0–40.0)

## 2022-10-27 MED ORDER — CELECOXIB 50 MG PO CAPS: 50.0000 mg | ORAL_CAPSULE | Freq: Two times a day (BID) | ORAL | 3 refills | Status: DC

## 2022-10-27 MED ORDER — TAPENTADOL HCL ER 50 MG PO TB12: 50.0000 mg | ORAL_TABLET | Freq: Two times a day (BID) | ORAL | 0 refills | Status: DC

## 2022-10-27 NOTE — Progress Notes (Unsigned)
Subjective:  Patient ID: Abigail Lamb, female    DOB: 1951/10/26  Age: 71 y.o. MRN: [id number]  CC: COPD, Osteoarthritis, and Hyperlipidemia   HPI Abigail Lamb presents for f/up - She complains of chronic NP cough. She has worsening joint pain and wants to try something in addition to celebrex.  Outpatient Medications Prior to Visit  Medication Sig Dispense Refill   albuterol (VENTOLIN HFA) 108 (90 Base) MCG/ACT inhaler Take 1-2 puffs every 4-6 hrs prn shortness of breath. Needs ventolin.     Cholecalciferol (VITAMIN D3) 125 MCG (5000 UT) CAPS Take 5,000 Units by mouth daily.     clindamycin (CLEOCIN T) 1 % external solution as needed.      colesevelam (WELCHOL) 625 MG tablet TAKE 3 TABLETS BY MOUTH TWICE A DAY WITH MEAL 180 tablet 0   dicyclomine (BENTYL) 10 MG capsule TAKE 1 CAPSULE (10 MG TOTAL) BY MOUTH 4 TIMES A DAY BEFORE MEALS AND AT BEDTIME 270 capsule 0   dronabinol (MARINOL) 2.5 MG capsule TAKE 1 CAPSULE BY MOUTH 2 TIMES DAILY BEFORE LUNCH AND SUPPER 180 capsule 0   DULoxetine (CYMBALTA) 60 MG capsule TAKE 1 CAPSULE(60 MG) BY MOUTH DAILY 90 capsule 1   famotidine (PEPCID) 40 MG tablet TAKE 1 TABLET BY MOUTH TWICE A DAY 180 tablet 1   fluocinonide-emollient (LIDEX-E) 0.05 % cream Apply 1 application topically 2 (two) times daily. 60 g 3   fluticasone (FLONASE) 50 MCG/ACT nasal spray Place 1 spray into both nostrils daily. 16 g 11   folic acid (FOLVITE) 1 MG tablet Take by mouth.     hydroxychloroquine (PLAQUENIL) 200 MG tablet TAKE 1 TABLET BY MOUTH TWICE A DAY 180 tablet 0   hydrOXYzine (ATARAX) 10 MG tablet TAKE 1 TABLET BY MOUTH EVERY 8 HOURS AS NEEDED 270 tablet 0   Insulin Pen Needle 31G X 5 MM MISC Use to inject Forteo once daily. DO NOT REUSE NEEDLE. 100 each 2   loratadine (CLARITIN) 10 MG tablet Take 10 mg by mouth daily.     ondansetron (ZOFRAN) 4 MG tablet TAKE 1 TABLET BY MOUTH EVERY 8 HOURS AS NEEDED FOR NAUSEA AND VOMITING 40 tablet 2   Teriparatide,  Recombinant, (FORTEO) 600 MCG/2.4ML SOPN Inject 20 mcg into the skin daily. DISCARD AFTER 28 days 9.6 mL 0   TUBERCULIN SYR 1CC/25GX5/8" 25G X 5/8" 1 ML MISC Use 1 syringe weekly for Methotrexate injection. 12 each 3   umeclidinium-vilanterol (ANORO ELLIPTA) 62.5-25 MCG/ACT AEPB Inhale 1 puff into the lungs daily. 1 each 11   varenicline (CHANTIX CONTINUING MONTH PAK) 1 MG tablet Take 1 tablet (1 mg total) by mouth 2 (two) times daily. 60 tablet 1   varenicline (CHANTIX PAK) 0.5 MG X 11 & 1 MG X 42 tablet Take one 0.5 mg tablet by mouth once daily for 3 days, then increase to one 0.5 mg tablet twice daily for 4 days, then increase to one 1 mg tablet twice daily. 53 tablet 0   celecoxib (CELEBREX) 50 MG capsule TAKE 1 CAPSULE BY MOUTH 2 TIMES DAILY. 60 capsule 3   umeclidinium-vilanterol (ANORO ELLIPTA) 62.5-25 MCG/ACT AEPB Inhale 1 puff into the lungs daily. 14 each 0   No facility-administered medications prior to visit.    ROS Review of Systems  Constitutional:  Positive for fatigue. Negative for diaphoresis.  HENT: Negative.    Eyes: Negative.   Respiratory:  Negative for cough, chest tightness, shortness of breath and wheezing.   Cardiovascular:  Negative for  chest pain, palpitations and leg swelling.  Gastrointestinal:  Negative for abdominal pain, constipation, diarrhea, nausea and vomiting.  Musculoskeletal:  Positive for arthralgias. Negative for myalgias.  Skin: Negative.   Neurological: Negative.  Negative for dizziness and weakness.  Hematological:  Negative for adenopathy. Does not bruise/bleed easily.  Psychiatric/Behavioral: Negative.      Objective:  BP 106/64 (BP Location: Left Arm, Patient Position: Sitting, Cuff Size: Large)   Pulse 64   Temp 97.6 F (36.4 C) (Oral)   Resp 16   Ht 5' 2"  (1.575 m)   Wt 79 lb (35.8 kg)   SpO2 96%   BMI 14.45 kg/m   BP Readings from Last 3 Encounters:  10/27/22 106/64  08/19/22 (!) 96/58  08/15/22 107/71    Wt Readings from  Last 3 Encounters:  10/27/22 79 lb (35.8 kg)  08/19/22 77 lb 6.4 oz (35.1 kg)  08/15/22 78 lb 3.2 oz (35.5 kg)    Physical Exam Vitals reviewed.  Constitutional:      General: She is not in acute distress.    Appearance: She is underweight. She is ill-appearing. She is not toxic-appearing or diaphoretic.  HENT:     Mouth/Throat:     Mouth: Mucous membranes are moist.  Eyes:     General: No scleral icterus.    Conjunctiva/sclera: Conjunctivae normal.  Cardiovascular:     Rate and Rhythm: Normal rate and regular rhythm.     Heart sounds: No murmur heard.    No friction rub. No gallop.  Pulmonary:     Effort: Pulmonary effort is normal.     Breath sounds: Examination of the right-upper field reveals decreased breath sounds. Examination of the left-upper field reveals decreased breath sounds. Examination of the right-middle field reveals decreased breath sounds. Examination of the left-middle field reveals decreased breath sounds. Examination of the right-lower field reveals decreased breath sounds. Examination of the left-lower field reveals decreased breath sounds. Decreased breath sounds present. No wheezing, rhonchi or rales.  Abdominal:     General: Abdomen is flat.     Palpations: There is no mass.     Tenderness: There is no abdominal tenderness. There is no guarding or rebound.     Hernia: No hernia is present.  Musculoskeletal:        General: Normal range of motion.     Cervical back: Neck supple.     Right lower leg: No edema.     Left lower leg: No edema.  Lymphadenopathy:     Cervical: No cervical adenopathy.  Skin:    General: Skin is warm and dry.  Neurological:     General: No focal deficit present.     Mental Status: She is alert.  Psychiatric:        Mood and Affect: Mood normal.        Behavior: Behavior normal.     Lab Results  Component Value Date   WBC 12.1 (H) 10/27/2022   HGB 13.7 10/27/2022   HCT 41.8 10/27/2022   PLT 325.0 10/27/2022    GLUCOSE 107 (H) 05/29/2022   CHOL 164 10/27/2022   TRIG 132.0 10/27/2022   HDL 66.20 10/27/2022   LDLCALC 71 10/27/2022   ALT 14 05/29/2022   AST 23 05/29/2022   NA 138 05/29/2022   K 4.4 05/29/2022   CL 104 05/29/2022   CREATININE 0.69 05/29/2022   BUN 15 05/29/2022   CO2 26 05/29/2022   TSH 3.22 11/07/2021   HGBA1C 5.5 08/02/2020  CT Super D Chest Wo Contrast  Result Date: 07/03/2022 CLINICAL DATA:  Right lower lobe lung nodule. Emphysema and weight loss. EXAM: CT CHEST WITHOUT CONTRAST TECHNIQUE: Multidetector CT imaging of the chest was performed using thin slice collimation for electromagnetic bronchoscopy planning purposes, without intravenous contrast. RADIATION DOSE REDUCTION: This exam was performed according to the departmental dose-optimization program which includes automated exposure control, adjustment of the mA and/or kV according to patient size and/or use of iterative reconstruction technique.COMPARISON:  12/19/2021 FINDINGS: Cardiovascular: Heart size appears within normal limits. No pericardial effusion. Aortic atherosclerosis and coronary artery calcifications. Mediastinum/Nodes: Thyroid gland, trachea and esophagus are unremarkable. No enlarged axillary or mediastinal lymph nodes. Hilar lymph nodes are suboptimally evaluated due to lack of IV contrast. Lungs/Pleura: Moderate to severe paraseptal and centrilobular emphysema is noted. No pleural effusion identified. Scattered peripheral predominant areas of tree-in-bud nodularity are identified within the inferior right middle lobe and periphery of the right lower lobe. Mild scratch set diffuse bronchial wall thickening is identified. No acute airspace consolidation, atelectasis or pneumothorax. Previous nodule within the periphery of the right lung base measuring 7 mm has resolved in the interval. Subpleural nodular density within the paravertebral aspect of the medial right lung base is also resolved in the interval. Within  the posterior aspect of the superior segment of the right lower lobe there is a stable 4 mm lung nodule, image 73/3. This is been unchanged since 02/08/2020 compatible with a benign nodule. Within the medial aspect of the right lower lobe there is a 4 mm paravertebral nodule, image 81/3. New since 02/08/2020. Slightly increased from 3 mm on 12/19/2021. Upper Abdomen: No acute abnormality within the imaged portions of the upper abdomen. Adenomatous enlargement of the left adrenal gland noted. No follow-up imaging recommended. Left kidney stones are identified. Musculoskeletal: No chest wall mass or suspicious bone lesions identified. IMPRESSION: 1. Previously noted subpleural nodules within the right lung base have resolved in the interval compatible with postinflammatory change. 2. There is a 4 mm paravertebral nodule within the medial aspect of the right lower lobe which is increased from 3 mm on 12/19/2021. New from 02/08/2020. Non-contrast chest CT can be considered at 12 months if patient is high-risk. This recommendation follows the consensus statement: Guidelines for Management of Incidental Pulmonary Nodules Detected on CT Images: From the Fleischner Society 2017; Radiology 2017; 284:228-243. 3. Scattered peripheral predominant areas of tree-in-bud nodularity are identified within the inferior right middle lobe and periphery of the right lower lobe compatible with nonspecific bronchiolitis. 4. Diffuse bronchial wall thickening with emphysema, as above; imaging findings suggestive of underlying COPD. 5. Left kidney stones. 6. Aortic Atherosclerosis (ICD10-I70.0) and Emphysema (ICD10-J43.9). Electronically Signed   By: Kerby Moors M.D.   On: 07/03/2022 07:47    Assessment & Plan:   Abigail Lamb was seen today for copd, osteoarthritis and hyperlipidemia.  Diagnoses and all orders for this visit:  Needs flu shot -     Flu Vaccine QUAD High Dose(Fluad)  Hyperlipidemia LDL goal <130- Statin is not  indicated. -     Lipid panel; Future -     Lipid panel  Leukocytosis, unspecified type- C/w tobacco abuse. -     CBC with Differential/Platelet; Future -     CBC with Differential/Platelet  Rheumatoid arthritis involving multiple sites with positive rheumatoid factor (HCC) -     celecoxib (CELEBREX) 50 MG capsule; Take 1 capsule (50 mg total) by mouth 2 (two) times daily. -     tapentadol (  NUCYNTA ER) 50 MG 12 hr tablet; Take 1 tablet (50 mg total) by mouth every 12 (twelve) hours.   I have changed Abigail Lamb's celecoxib. I am also having her start on tapentadol. Additionally, I am having her maintain her albuterol, folic acid, clindamycin, Vitamin D3, loratadine, fluocinonide-emollient, TUBERCULIN SYR 1CC/25GX5/8", dronabinol, ondansetron, Insulin Pen Needle, dicyclomine, Forteo, hydrOXYzine, famotidine, colesevelam, Anoro Ellipta, fluticasone, varenicline, varenicline, hydroxychloroquine, and DULoxetine.  Meds ordered this encounter  Medications   celecoxib (CELEBREX) 50 MG capsule    Sig: Take 1 capsule (50 mg total) by mouth 2 (two) times daily.    Dispense:  60 capsule    Refill:  3   tapentadol (NUCYNTA ER) 50 MG 12 hr tablet    Sig: Take 1 tablet (50 mg total) by mouth every 12 (twelve) hours.    Dispense:  60 tablet    Refill:  0     Follow-up: Return in about 6 months (around 04/28/2023).  Scarlette Calico, MD

## 2022-10-27 NOTE — Patient Instructions (Signed)
Complete Blood Count Why am I having this test? A complete blood count (CBC) is a blood test that measures the cells of your blood. The types of cells are red blood cells (RBCs), white blood cells (WBCs), and platelets. Changes in these cells can be a sign of many conditions, such as anemia, infection, inflammation, bleeding, and blood-related cancer. You may have this test: As part of a routine physical exam. To help your health care provider diagnose certain health conditions. To help your health care provider watch known health conditions or treatments. What is being tested? A CBC measures your RBCs, WBCs, platelets, and their different parts. RBCs are made in the tissue inside your bones (bone marrow) and released into your blood. These cells contain a protein that carries oxygen in your blood (hemoglobin). CBC testing of RBCs includes: RBC count. This is the total number of RBCs. Hemoglobin (Hb). This is the total amount of hemoglobin. Hematocrit (Hct). This is the percentage of space that RBCs take up in your blood. Mean corpuscular volume (MCV). This is the average size of RBCs. Mean corpuscular hemoglobin [facility]). This is the average amount of hemoglobin inside each RBC. Mean corpuscular hemoglobin concentration (MCHC). This is the average amount of hemoglobin contained (concentrated) inside each RBC. RBC distribution width (RDW). This may be included to measure the differences in the size of RBCs. WBCs are also made in your bone marrow. They are part of your body's disease-fighting system (immune system). CBC testing of WBCs includes: WBC count. This is the total number of WBCs. A count of the five types of WBCs: Neutrophils. Lymphocytes. Monocytes. Eosinophils. Basophils. Platelets are cell fragments that are important for blood clotting. A CBC measures: Total platelet count. Mean platelet volume (MPV). What kind of sample is taken?  A blood sample is required for this test. It  is usually collected by inserting a needle into a blood vessel. How are the results reported? Your test results will be reported as values. Your health care provider will compare your results to normal ranges that were established after testing a large group of people (reference ranges). Reference ranges may vary among labs and hospitals. Reference ranges for a CBC usually apply only to people who are older than 18. For this test, common reference ranges for people older than 18 may be: Red blood cells RBC count Males: 4.7-6.1 Females: 4.2-5.4 Hb Males: 14-18 Females: 12-16 Hct Males: 42-52% Females: 37-47% MCV: 80-95 MCH: 27-31 MCHC: 32-36 RDW: 11-14.5% White blood cells WBC count: 5,000-10,000 Neutrophil count: 2,500-8,000 or 55-70% Lymphocyte count: 1,000-4,000 or 20-40% Monocyte count: 100-700 or 2-8% Eosinophil count: 50-500 or 1-4% Basophil count: 25-100 or 0.5-1% Platelets Platelet count: 150,000-400,000 MPV: 7.4-10.4 What do the results mean? Results that are outside the reference ranges may mean that you have an infection or other health condition. Red blood cells RBC count, Hb, and Hct Results lower than the reference ranges may indicate anemia. Anemia may be caused by several conditions, such as a lack of iron (iron deficiency anemia). Results higher than the reference ranges may mean you have too many RBCs (polycythemia). This may be caused by several conditions, such as chronic obstructive pulmonary disease (COPD). MCV, MCH, MCHC, and RDW Results outside the reference ranges may indicate a condition that causes anemia. White blood cells WBC count Results lower than the reference range may indicate an infection or a condition that keeps your bone marrow from making new WBCs. Results higher than the reference range may indicate an  infection, a condition that causes inflammation (autoimmune disease), or a blood-related cancer. Neutrophils, lymphocytes, and  monocytes Results outside the reference ranges may indicate an infection, an autoimmune disease, or certain types of cancer. Eosinophils and basophils Results outside the reference ranges may indicate an allergy, an inflammatory condition such as asthma, or blood cell cancer. Platelets Platelet count Results lower than the reference range may indicate an infection or blood cell cancer. Results higher than the reference range may indicate certain types of anemia, an autoimmune disease, or certain cancers. MPV High or low results may indicate a problem with your bone marrow. Talk with your health care provider about what your results mean. Questions to ask your health care provider Ask your health care provider, or the department that is doing the test: When will my results be ready? How will I get my results? What are my treatment options? What other tests do I need? What are my next steps? This information is not intended to replace advice given to you by your health care provider. Make sure you discuss any questions you have with your health care provider. Document Revised: 04/28/2022 Document Reviewed: 04/28/2022 Elsevier Patient Education  [place].

## 2022-10-27 NOTE — Progress Notes (Signed)
CBC stable.

## 2022-10-28 ENCOUNTER — Encounter: Payer: Self-pay | Admitting: *Deleted

## 2022-10-29 ENCOUNTER — Telehealth: Payer: Self-pay | Admitting: Pharmacist

## 2022-10-29 NOTE — Telephone Encounter (Signed)
Received fax from [facility] that they are taking 2024 patient assistance renewal application. Patient receives FORTEO through patient assistance program. Patient states she prefers application to be emailed. Emailed her portion of application today.   Provider portion placed in Dr. Arlean Hopping folder to be signed   Knox Saliva, PharmD, MPH, BCPS, CPP Clinical Pharmacist (Rheumatology and Pulmonology)

## 2022-11-03 NOTE — Telephone Encounter (Signed)
Signed provider form for Forte[employer] PAP received. Submission pending patient portion  Knox Saliva, PharmD, MPH, BCPS, CPP Clinical Pharmacist (Rheumatology and Pulmonology)

## 2022-11-11 NOTE — Progress Notes (Signed)
Office Visit Note  Patient: Abigail Lamb             Date of Birth: 31-Jul-1951           MRN: [id number]             PCP: Janith Lima, MD Referring: Janith Lima, MD Visit Date: 11/25/2022 Occupation: @GUAROCC @  Subjective:  Pain in both hands   History of Present Illness: Abigail Lamb is a 71 y.o. female with history of seropositive rheumatoid arthritis.  She is currently taking Plaquenil 200 mg 1 tablet by mouth twice daily.  She continues to take Celebrex 50 mg 1 capsule by mouth twice daily for pain relief.  The use of sulfasalazine as combination therapy was discussed at her last office visit but she declined at that time.  She continues to have pain and swelling in both hands and both wrist joints.  She has noticed some progression in her symptoms.  She has difficulty extending her right third, fourth, and fifth digits due to tendon rupture previously.  She is apprehensive to add any medications at this time due to dental side effects.  Patient reports that about 3 weeks ago she started on Chantix to help with tobacco cessation.  She has been able to cut back on her tobacco use but has been experiencing some nausea and intermittent vomiting since initiating Chantix.  She is apprehensive to add any medications at this time specially due to the concern for GI side effects.  She has been taking Forteo daily injections at night without interruption.  She denies any recent falls or fractures.  She is taking vitamin D 5000 units daily.    Activities of Daily Living:  Patient reports morning stiffness for 2 hours.   Patient Reports nocturnal pain.  Difficulty dressing/grooming: Reports Difficulty climbing stairs: Reports Difficulty getting out of chair: Reports Difficulty using hands for taps, buttons, cutlery, and/or writing: Reports  Review of Systems  Constitutional:  Positive for fatigue.  HENT:  Positive for mouth dryness. Negative for mouth sores and nose dryness.    Eyes:  Positive for itching and dryness. Negative for pain and visual disturbance.  Respiratory:  Negative for cough, hemoptysis and difficulty breathing.   Cardiovascular:  Positive for palpitations. Negative for hypertension and swelling in legs/feet.  Gastrointestinal:  Positive for nausea. Negative for blood in stool, constipation and diarrhea.  Endocrine: Positive for increased urination.  Genitourinary:  Positive for involuntary urination and nocturia. Negative for painful urination.  Musculoskeletal:  Positive for joint pain, gait problem, joint pain, joint swelling, myalgias, muscle weakness, morning stiffness, muscle tenderness and myalgias.  Skin:  Positive for color change, rash and sensitivity to sunlight. Negative for pallor, hair loss, nodules/bumps, skin tightness and ulcers.  Allergic/Immunologic: Positive for susceptible to infections.  Neurological:  Positive for dizziness and headaches. Negative for numbness and weakness.  Hematological:  Negative for swollen glands.  Psychiatric/Behavioral:  Positive for depressed mood and sleep disturbance. The patient is nervous/anxious.     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 ([place]) 11/08/2021   Gastroparesis 11/07/2021   Irritable bowel syndrome with both constipation and diarrhea 11/07/2021   Encounter for general adult medical examination with abnormal findings 11/07/2021   Leukocytosis 09/98/3382   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   Routine general medical examination  at a health care facility 08/02/2020   Visit for screening mammogram 08/02/2020   Rheumatoid arthritis involving multiple sites with positive rheumatoid factor ([place]) 08/02/2020   Hyperlipidemia LDL goal <130 08/02/2020   Tobacco abuse 08/02/2020   Dupuytren's  contracture of right hand 08/02/2020   Pulmonary emphysema (HCC)    Stage 2 moderate COPD by GOLD classification ([place]) 01/30/2020   Neurodermatitis 03/28/2019   Primary insomnia 03/28/2019   Recurrent depression ([place]) 03/28/2019    Past Medical History:  Diagnosis Date   Celiac disease    GERD (gastroesophageal reflux disease)    MGUS (monoclonal gammopathy of unknown significance)    per patient    Osteoporosis    per patient, diagnosed by Dr. Shona Simpson in [place]   PUD (peptic ulcer disease)    Pulmonary emphysema ([place])    Recurrent depression ([place])     Family History  Problem Relation Age of Onset   CVA Mother    CAD Mother    Diabetes Mother    COPD Mother    Fibromyalgia Mother    Depression Mother    Hypertension Mother    Emphysema Mother        smoked   Heart attack Mother    Prostate cancer Father    Cancer - Prostate Father    Rheum arthritis Sister    Skin cancer Brother    Kidney Stones Brother    Hernia Brother    Depression Daughter    Healthy Son    Healthy Son    Past Surgical History:  Procedure Laterality Date   ABCESS DRAINAGE Left    under left arm   TONSILLECTOMY  1977   Social History   Social History Narrative   Not on file   Immunization History  Administered Date(s) Administered   Fluad Quad(high Dose 65+) 11/08/2021, 10/27/2022   Influenza, High Dose Seasonal PF 12/30/2019   Influenza-Unspecified 01/04/2018, 12/15/2018, 09/11/2020   PNEUMOCOCCAL CONJUGATE-20 11/08/2021   Pneumococcal Polysaccharide-23 08/02/2020   Tdap 11/05/2020   Zoster Recombinat (Shingrix) 03/20/2021, 11/27/2021     Objective: Vital Signs: BP 116/79 (BP Location: Left Arm, Patient Position: Sitting, Cuff Size: Normal)   Pulse 88   Resp 15   Ht 5' 2"  (1.575 m)   Wt 81 lb 9.6 oz (37 kg)   BMI 14.92 kg/m    Physical Exam Vitals and nursing note reviewed.  Constitutional:      Appearance: She is well-developed.  HENT:     Head: Normocephalic and  atraumatic.  Eyes:     Conjunctiva/sclera: Conjunctivae normal.  Cardiovascular:     Rate and Rhythm: Normal rate and regular rhythm.     Heart sounds: Normal heart sounds.  Pulmonary:     Effort: Pulmonary effort is normal.     Breath sounds: Normal breath sounds.  Abdominal:     General: Bowel sounds are normal.     Palpations: Abdomen is soft.  Musculoskeletal:     Cervical back: Normal range of motion.  Skin:    General: Skin is warm and dry.     Capillary Refill: Capillary refill takes less than 2 seconds.  Neurological:     Mental Status: She is alert and oriented to person, place, and time.  Psychiatric:        Behavior: Behavior normal.      Musculoskeletal Exam: C-spine has limited range of motion with lateral rotation.  Thoracic kyphosis noted.  Shoulder joints and elbow joints have good range of  motion.  Rheumatoid nodules noted on the extensor surface of both elbows.  Synovitis and limited range of motion of both wrist joints.  Synovial thickening and synovitis over several MCP joints with subluxation and ulnar deviation especially in the right hand.  Hip joints have good range of motion with no groin pain.  Knee joints have good range of motion with some tenderness but no warmth or effusion noted.  Ankle joints have good range of motion with no tenderness or synovitis.  CDAI Exam: CDAI Score: 17.6  Patient Global: 8 mm; Provider Global: 8 mm Swollen: 7 ; Tender: 9  Joint Exam 11/25/2022      Right  Left  Wrist  Swollen Tender  Swollen Tender  MCP 2  Swollen Tender  Swollen Tender  MCP 3  Swollen Tender  Swollen Tender  MCP 5  Swollen Tender     Knee   Tender   Tender     Investigation: No additional findings.  Imaging: No results found.  Recent Labs: Lab Results  Component Value Date   WBC 12.1 (H) 10/27/2022   HGB 13.7 10/27/2022   PLT 325.0 10/27/2022   NA 138 05/29/2022   K 4.4 05/29/2022   CL 104 05/29/2022   CO2 26 05/29/2022   GLUCOSE 107  (H) 05/29/2022   BUN 15 05/29/2022   CREATININE 0.69 05/29/2022   BILITOT 0.4 05/29/2022   ALKPHOS 114 05/29/2022   AST 23 05/29/2022   ALT 14 05/29/2022   PROT 7.6 05/29/2022   ALBUMIN 3.5 05/29/2022   CALCIUM 9.8 05/29/2022   GFRAA 107 10/26/2020   QFTBGOLDPLUS NEGATIVE 09/17/2020    Speciality Comments: PLQ Eye Exam: 08/14/2022 WNL @ [facility] Follow up 1 year.   Humira and Orencia were discontinued in the past due to frequent infections per patient.  Forteo started 05/29/22  Procedures:  No procedures performed Allergies: Dust mite extract      Assessment / Plan:     Visit Diagnoses: Rheumatoid arthritis with rheumatoid factor of multiple sites without organ or systems involvement (HCC) - End-stage severe rheumatoid arthritis with nodulosis and multiple contractures, severe erosive changes noted in both hands and both feet: Patient has ongoing tenderness and synovitis involving multiple joints especially both hands and both wrist joints.  She is currently taking Plaquenil 200 mg 1 tablet by mouth twice daily and Celebrex 50 mg 1 tablet twice daily for pain relief.  She has been tolerating Plaquenil without any side effects and has not missed any doses recently.  She continues to notice progression in her arthritis and has difficulty performing ADLs at times.  At her last office visit on 08/15/2022 there was discussion of adding on sulfasalazine as combination therapy but she declined at that time due to being apprehensive of possible side effects.  She previously discontinued Orencia and methotrexate due to recurrent infections.  Indications, contraindications, potential side effects of sulfasalazine were discussed today in detail.  She was given a handout of information about sulfasalazine to further review.  He does not want to add any medications at this time since she was recently started on Chantix about 3 weeks ago and is having some nausea since initiating therapy.  She  plans on reviewing sulfasalazine will notify us if she would like to proceed with sulfasalazine in the future.  We can further discuss at her upcoming follow-up visit.  In the meantime she will remain on Plaquenil as prescribed.  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  Medication counseling:  Patient was counseled on the purpose, proper use, and adverse effects of sulfasalazine including risk of infection and chance of nausea, headache, and sun sensitivity.  Also discussed risk of skin rash and advised patient to stop the medication and let us know if she develops a rash. Also discussed for the potential of discoloration of the urine, sweat, or tears.  Advised patient to avoid live vaccines.  Recommend annual influenza, Pneumovax 23, Prevnar 13, and Shingrix as indicated.   High risk medication use - Plaquenil 200 mg 1 tablet by mouth twice daily.  No longer taking prednisone.  She remains on Celebrex 50 mg 1 capsule twice daily prescribed by Dr. Ronnald Ramp.  PLQ Eye Exam: 08/14/2022 WNL @ [facility] Follow up 1 year.  CBC updated on 10/27/22. CMP drawn on 05/29/22.  Patient will be having updated lab work on 11/28/2022 at the cancer center.  She will have results forwarded to our office to review.  Primary osteoarthritis of both knees: Painful range of motion of both knee joints.  She has some tenderness to palpation about both knees but no warmth or effusion noted.  She has started to increase her walking regimen gradually.  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  She had recent significant weight loss.  She is followed by Dr. Lorenso Courier.  Upcoming appointment and lab work on 11/28/2022.  Age-related osteoporosis without current pathological fracture - Forteo injections started 05/29/22. DEXA updated on 02/07/2022: LFN BMD 0.440 with T score -3.7.  Forteo daily injections were started 05/29/22.  No missed doses.  She is taking vitamin D 5000 units daily.  No  recent falls or fractures.  She has started to increase her walking regimen gradually.  Vitamin D deficiency:  She is taking vitamin D 5,000 units daily.   Other medical conditions are listed as follows:   Pain in left hip  Stage 2 moderate COPD by GOLD classification ([place]) - evaluated by the pulmonologist.  Prediabetes  Lung nodules  Kidney stone  Tobacco abuse  Recurrent depression ([place])  Panlobular emphysema ([place])  Hyperlipidemia LDL goal <130  Primary insomnia  Orders: No orders of the defined types were placed in this encounter.  No orders of the defined types were placed in this encounter.    Follow-Up Instructions: Return in about 3 months (around 02/25/2023) for Rheumatoid arthritis.   Ofilia Neas, PA-C  Note - This record has been created using Dragon software.  Chart creation errors have been sought, but may not always  have been located. Such creation errors do not reflect on  the standard of medical care.

## 2022-11-14 ENCOUNTER — Other Ambulatory Visit: Payer: Self-pay | Admitting: Internal Medicine

## 2022-11-14 DIAGNOSIS — E785 Hyperlipidemia, unspecified: Secondary | ICD-10-CM

## 2022-11-17 NOTE — Telephone Encounter (Signed)
Confirmed with patient that she received the email with patient portion of PAP paperwork. Stated that it is "on her to-do list" and she will try to get it done today. Confirmed that she will be emailing it back to [place] upon completion.  [place] [facility] PharmD Candidate [phone]

## 2022-11-18 NOTE — Telephone Encounter (Signed)
Submitted Patient Assistance RENEWAL Application to [facility] for FORTEO along with provider portion, patient portion, med list, insurance card copy, PA and income documents. Will update patient when we receive a response.  Fax# [phone] Phone# [phone]  Knox Saliva, PharmD, MPH, BCPS, CPP Clinical Pharmacist (Rheumatology and Pulmonology)

## 2022-11-19 NOTE — Telephone Encounter (Signed)
Received a fax from  [facility] regarding an approval for Abigail Lamb patient assistance from 12/29/22 to 12/29/23.   Phone number: [phone]  Knox Saliva, PharmD, MPH, BCPS, CPP Clinical Pharmacist (Rheumatology and Pulmonology)

## 2022-11-24 ENCOUNTER — Other Ambulatory Visit: Payer: Self-pay | Admitting: Physician Assistant

## 2022-11-24 DIAGNOSIS — M810 Age-related osteoporosis without current pathological fracture: Secondary | ICD-10-CM

## 2022-11-24 NOTE — Telephone Encounter (Signed)
Next Visit: 11/25/2022  Last Visit: 08/15/2022  Last Fill: 06/04/2022  DX: Age-related osteoporosis without current pathological fracture   Current Dose per office note 08/15/2022: She was started on Forteo on May 29, 2022   Labs: 10/27/2022 WBC 12.1 Neutro Abs 8.2 05/29/2022 Glucose 107   Okay to refill Forteo?

## 2022-11-25 ENCOUNTER — Ambulatory Visit: Payer: Medicare Other | Attending: Physician Assistant | Admitting: Physician Assistant

## 2022-11-25 ENCOUNTER — Encounter: Payer: Self-pay | Admitting: Physician Assistant

## 2022-11-25 VITALS — BP 116/79 | HR 88 | Resp 15 | Ht 62.0 in | Wt 81.6 lb

## 2022-11-25 DIAGNOSIS — R7303 Prediabetes: Secondary | ICD-10-CM | POA: Diagnosis not present

## 2022-11-25 DIAGNOSIS — Z79899 Other long term (current) drug therapy: Secondary | ICD-10-CM | POA: Diagnosis not present

## 2022-11-25 DIAGNOSIS — M0630 Rheumatoid nodule, unspecified site: Secondary | ICD-10-CM | POA: Diagnosis not present

## 2022-11-25 DIAGNOSIS — R918 Other nonspecific abnormal finding of lung field: Secondary | ICD-10-CM

## 2022-11-25 DIAGNOSIS — E559 Vitamin D deficiency, unspecified: Secondary | ICD-10-CM | POA: Diagnosis not present

## 2022-11-25 DIAGNOSIS — M25552 Pain in left hip: Secondary | ICD-10-CM | POA: Diagnosis not present

## 2022-11-25 DIAGNOSIS — D472 Monoclonal gammopathy: Secondary | ICD-10-CM

## 2022-11-25 DIAGNOSIS — F5101 Primary insomnia: Secondary | ICD-10-CM

## 2022-11-25 DIAGNOSIS — M0579 Rheumatoid arthritis with rheumatoid factor of multiple sites without organ or systems involvement: Secondary | ICD-10-CM | POA: Diagnosis not present

## 2022-11-25 DIAGNOSIS — M170 Bilateral primary osteoarthritis of knee: Secondary | ICD-10-CM | POA: Diagnosis not present

## 2022-11-25 DIAGNOSIS — M810 Age-related osteoporosis without current pathological fracture: Secondary | ICD-10-CM

## 2022-11-25 DIAGNOSIS — J449 Chronic obstructive pulmonary disease, unspecified: Secondary | ICD-10-CM | POA: Diagnosis not present

## 2022-11-25 DIAGNOSIS — N200 Calculus of kidney: Secondary | ICD-10-CM

## 2022-11-25 DIAGNOSIS — Z720 Tobacco use: Secondary | ICD-10-CM

## 2022-11-25 DIAGNOSIS — E785 Hyperlipidemia, unspecified: Secondary | ICD-10-CM

## 2022-11-25 DIAGNOSIS — J431 Panlobular emphysema: Secondary | ICD-10-CM

## 2022-11-25 DIAGNOSIS — F339 Major depressive disorder, recurrent, unspecified: Secondary | ICD-10-CM

## 2022-11-28 ENCOUNTER — Inpatient Hospital Stay: Payer: Medicare Other

## 2022-11-28 ENCOUNTER — Other Ambulatory Visit: Payer: Self-pay | Admitting: Hematology and Oncology

## 2022-11-28 ENCOUNTER — Other Ambulatory Visit: Payer: Self-pay

## 2022-11-28 ENCOUNTER — Inpatient Hospital Stay: Payer: Medicare Other | Attending: Hematology and Oncology | Admitting: Hematology and Oncology

## 2022-11-28 ENCOUNTER — Other Ambulatory Visit: Payer: Self-pay | Admitting: Lab

## 2022-11-28 VITALS — BP 135/68 | HR 100 | Temp 97.7°F | Resp 17 | Wt 78.4 lb

## 2022-11-28 DIAGNOSIS — D472 Monoclonal gammopathy: Secondary | ICD-10-CM

## 2022-11-28 DIAGNOSIS — Z79899 Other long term (current) drug therapy: Secondary | ICD-10-CM | POA: Insufficient documentation

## 2022-11-28 DIAGNOSIS — M2550 Pain in unspecified joint: Secondary | ICD-10-CM | POA: Diagnosis not present

## 2022-11-28 DIAGNOSIS — F17210 Nicotine dependence, cigarettes, uncomplicated: Secondary | ICD-10-CM | POA: Diagnosis not present

## 2022-11-28 LAB — CMP (CANCER CENTER ONLY)
ALT: 13 U/L (ref 0–44)
AST: 22 U/L (ref 15–41)
Albumin: 4.1 g/dL (ref 3.5–5.0)
Alkaline Phosphatase: 152 U/L — ABNORMAL HIGH (ref 38–126)
Anion gap: 10 (ref 5–15)
BUN: 18 mg/dL (ref 8–23)
CO2: 26 mmol/L (ref 22–32)
Calcium: 10.5 mg/dL — ABNORMAL HIGH (ref 8.9–10.3)
Chloride: 101 mmol/L (ref 98–111)
Creatinine: 0.61 mg/dL (ref 0.44–1.00)
GFR, Estimated: >60 mL/min (ref >60)
Glucose, Bld: 75 mg/dL (ref 70–99)
Potassium: 4 mmol/L (ref 3.5–5.1)
Sodium: 137 mmol/L (ref 135–145)
Total Bilirubin: 0.4 mg/dL (ref 0.3–1.2)
Total Protein: 8.3 g/dL — ABNORMAL HIGH (ref 6.5–8.1)

## 2022-11-28 LAB — CBC WITH DIFFERENTIAL (CANCER CENTER ONLY)
Abs Immature Granulocytes: 0.02 10*3/uL (ref 0.00–0.07)
Basophils Absolute: 0.1 10*3/uL (ref 0.0–0.1)
Basophils Relative: 1 %
Eosinophils Absolute: 0.2 10*3/uL (ref 0.0–0.5)
Eosinophils Relative: 3 %
HCT: 41.4 % (ref 36.0–46.0)
Hemoglobin: 13.7 g/dL (ref 12.0–15.0)
Immature Granulocytes: 0 %
Lymphocytes Relative: 31 %
Lymphs Abs: 3 10*3/uL (ref 0.7–4.0)
MCH: 29.9 pg (ref 26.0–34.0)
MCHC: 33.1 g/dL (ref 30.0–36.0)
MCV: 90.4 fL (ref 80.0–100.0)
Monocytes Absolute: 0.8 10*3/uL (ref 0.1–1.0)
Monocytes Relative: 8 %
Neutro Abs: 5.6 10*3/uL (ref 1.7–7.7)
Neutrophils Relative %: 57 %
Platelet Count: 297 10*3/uL (ref 150–400)
RBC: 4.58 MIL/uL (ref 3.87–5.11)
RDW: 13.9 % (ref 11.5–15.5)
WBC Count: 9.8 10*3/uL (ref 4.0–10.5)
nRBC: 0 % (ref 0.0–0.2)

## 2022-11-28 LAB — LACTATE DEHYDROGENASE: LDH: 228 U/L — ABNORMAL HIGH (ref 98–192)

## 2022-11-28 NOTE — Progress Notes (Unsigned)
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 Found during rheumatologist evaluation for RA.  2) 10/12/2020: establish care with Dr. Lorenso Courier. M protein 0.6, IgM kappa specificity. Kappa 49.3, Lambda 20.3, ratio 2.43.   3) 10/24/2020: Bone marrow biopsy revealed normocellular marrow with polytypic plasmacytosis (5%).   Interval History:  Abigail Lamb 71 y.o. female with medical history significant for IgM kappa monoclonal gammopathy who presents for a follow up visit. The patient's last visit was on 05/29/2022. In the interim since the last visit she has had no major changes in her health.  On exam today Abigail Lamb reports she has been well overall interim since her last visit.  She reports that she has had no major changes in her health.  She does that she continues to follow with rheumatology.  She is "hoping for some weight gain".  She notes that she is having difficulty with her hands having joint pain.  She reports that she is feeling weak, and tired.  She also has numerous aches and pains.  She is undergoing evaluation by pulmonology and recently underwent a sputum test, though she feels her lung function is improving.  She reports that she is interested in stopping smoking and is taking Chantix though she does smoke an occasional cigarette.  She has not noticed any change in her urine.  She denies any darkening of the color or bubbling.  She is not having any fevers, chills, sweats.  She denies any bone or back pain at this time.  A full 10 point ROS is listed below.  MEDICAL HISTORY:  Past Medical History:  Diagnosis Date   Celiac disease    GERD (gastroesophageal reflux disease)    MGUS (monoclonal  gammopathy of unknown significance)    per patient    Osteoporosis    per patient, diagnosed by Dr. Shona Simpson in [facility]   PUD (peptic ulcer disease)    Pulmonary emphysema ([place])    Recurrent depression ([place])     SURGICAL HISTORY: Past Surgical History:  Procedure Laterality Date   ABCESS DRAINAGE Left    under left arm   TONSILLECTOMY  1977    SOCIAL HISTORY: Social History   Socioeconomic History   Marital status: Divorced    Spouse name: Not on file   Number of children: Not on file   Years of education: Not on file   Highest education level: Not on file  Occupational History   Not on file  Tobacco Use   Smoking status: Every Day    Packs/day: 0.50    Years: 49.00    Total pack years: 24.50    Types: Cigarettes    Passive exposure: Current   Smokeless tobacco: Never   Tobacco comments:    Currently 1/2- 1 ppd 06/05/22 [place], CMA   Vaping Use   Vaping Use: Never used  Substance and Sexual Activity   Alcohol use: Not Currently   Drug use: Never   Sexual activity: Not Currently    Partners: Male  Other Topics Concern   Not on file  Social History Narrative   Not on file   Social Determinants of Health   Financial Resource Strain: Low Risk  (03/11/2022)   Overall Financial Resource Strain ([place])  Difficulty of Paying Living Expenses: Not hard at all  Food Insecurity: No Food Insecurity (03/11/2022)   Hunger Vital Sign    Worried About Running Out of Food in the Last Year: Never true    Ran Out of Food in the Last Year: Never true  Transportation Needs: No Transportation Needs (03/11/2022)   PRAPARE - Hydrologist (Medical): No    Lack of Transportation (Non-Medical): No  Physical Activity: Inactive (03/11/2022)   Exercise Vital Sign    Days of Exercise per Week: 0 days    Minutes of Exercise per Session: 0 min  Stress: No Stress Concern Present (03/11/2022)   [place]    Feeling of Stress : Not at all  Social Connections: Socially Isolated (03/11/2022)   Social Connection and Isolation Panel [NHANES]    Frequency of Communication with Friends and Family: More than three times a week    Frequency of Social Gatherings with Friends and Family: Twice a week    Attends Religious Services: Never    Marine scientist or Organizations: No    Attends Archivist Meetings: Never    Marital Status: Divorced  Human resources officer Violence: Not At Risk (03/11/2022)   Humiliation, Afraid, Rape, and Kick questionnaire    Fear of Current or Ex-Partner: No    Emotionally Abused: No    Physically Abused: No    Sexually Abused: No    FAMILY HISTORY: Family History  Problem Relation Age of Onset   CVA Mother    CAD Mother    Diabetes Mother    COPD Mother    Fibromyalgia Mother    Depression Mother    Hypertension Mother    Emphysema Mother        smoked   Heart attack Mother    Prostate cancer Father    Cancer - Prostate Father    Rheum arthritis Sister    Skin cancer Brother    Kidney Stones Brother    Hernia Brother    Depression Daughter    Healthy Son    Healthy Son     ALLERGIES:  is allergic to dust mite extract.  MEDICATIONS:  Current Outpatient Medications  Medication Sig Dispense Refill   albuterol (VENTOLIN HFA) 108 (90 Base) MCG/ACT inhaler Take 1-2 puffs every 4-6 hrs prn shortness of breath. Needs ventolin.     celecoxib (CELEBREX) 50 MG capsule Take 1 capsule (50 mg total) by mouth 2 (two) times daily. 60 capsule 3   Cholecalciferol (VITAMIN D3) 125 MCG (5000 UT) CAPS Take 5,000 Units by mouth daily.     clindamycin (CLEOCIN T) 1 % external solution as needed.      colesevelam (WELCHOL) 625 MG tablet TAKE 3 TABLETS BY MOUTH TWICE A DAY WITH MEAL 180 tablet 0   dicyclomine (BENTYL) 10 MG capsule TAKE 1 CAPSULE (10 MG TOTAL) BY MOUTH 4 TIMES A DAY BEFORE MEALS AND AT BEDTIME 270 capsule 0   dronabinol  (MARINOL) 2.5 MG capsule TAKE 1 CAPSULE BY MOUTH 2 TIMES DAILY BEFORE LUNCH AND SUPPER (Patient not taking: Reported on 11/25/2022) 180 capsule 0   DULoxetine (CYMBALTA) 60 MG capsule TAKE 1 CAPSULE(60 MG) BY MOUTH DAILY 90 capsule 1   famotidine (PEPCID) 40 MG tablet TAKE 1 TABLET BY MOUTH TWICE A DAY 180 tablet 1   fluocinonide-emollient (LIDEX-E) 0.05 % cream Apply 1 application topically 2 (two) times daily. 60 g  3   fluticasone (FLONASE) 50 MCG/ACT nasal spray Place 1 spray into both nostrils daily. 16 g 11   folic acid (FOLVITE) 1 MG tablet Take by mouth.     FORTEO 600 MCG/2.4ML SOPN INJECT 20MCG UNDER THE SKIN ONCE DAILY. DISCARD DEVICE 28 DAYS AFTER INITIAL USE. 9.6 mL 0   hydroxychloroquine (PLAQUENIL) 200 MG tablet TAKE 1 TABLET BY MOUTH TWICE A DAY 180 tablet 0   hydrOXYzine (ATARAX) 10 MG tablet TAKE 1 TABLET BY MOUTH EVERY 8 HOURS AS NEEDED 270 tablet 0   Insulin Pen Needle 31G X 5 MM MISC Use to inject Forteo once daily. DO NOT REUSE NEEDLE. 100 each 2   loratadine (CLARITIN) 10 MG tablet Take 10 mg by mouth daily. (Patient not taking: Reported on 11/25/2022)     ondansetron (ZOFRAN) 4 MG tablet TAKE 1 TABLET BY MOUTH EVERY 8 HOURS AS NEEDED FOR NAUSEA AND VOMITING 40 tablet 2   tapentadol (NUCYNTA ER) 50 MG 12 hr tablet Take 1 tablet (50 mg total) by mouth every 12 (twelve) hours. (Patient not taking: Reported on 11/25/2022) 60 tablet 0   TUBERCULIN SYR 1CC/25GX5/8" 25G X 5/8" 1 ML MISC Use 1 syringe weekly for Methotrexate injection. (Patient not taking: Reported on 11/25/2022) 12 each 3   umeclidinium-vilanterol (ANORO ELLIPTA) 62.5-25 MCG/ACT AEPB Inhale 1 puff into the lungs daily. 1 each 11   varenicline (CHANTIX CONTINUING MONTH PAK) 1 MG tablet Take 1 tablet (1 mg total) by mouth 2 (two) times daily. 60 tablet 1   varenicline (CHANTIX PAK) 0.5 MG X 11 & 1 MG X 42 tablet Take one 0.5 mg tablet by mouth once daily for 3 days, then increase to one 0.5 mg tablet twice daily for 4  days, then increase to one 1 mg tablet twice daily. (Patient not taking: Reported on 11/25/2022) 53 tablet 0   No current facility-administered medications for this visit.    REVIEW OF SYSTEMS:   Constitutional: ( - ) fevers, ( - )  chills , ( - ) night sweats Eyes: ( - ) blurriness of vision, ( - ) double vision, ( - ) watery eyes Ears, nose, mouth, throat, and face: ( - ) mucositis, ( - ) sore throat Respiratory: ( - ) cough, ( - ) dyspnea, ( - ) wheezes Cardiovascular: ( - ) palpitation, ( - ) chest discomfort, ( - ) lower extremity swelling Gastrointestinal:  ( - ) nausea, ( - ) heartburn, ( - ) change in bowel habits Skin: ( - ) abnormal skin rashes Lymphatics: ( - ) new lymphadenopathy, ( - ) easy bruising Neurological: ( - ) numbness, ( - ) tingling, ( - ) new weaknesses Behavioral/Psych: ( - ) mood change, ( - ) new changes  All other systems were reviewed with the patient and are negative.  PHYSICAL EXAMINATION: ECOG PERFORMANCE STATUS: 1 - Symptomatic but completely ambulatory  Vitals:   11/28/22 1441  BP: 135/68  Pulse: 100  Resp: 17  Temp: 97.7 F (36.5 C)  SpO2: 99%    Filed Weights   11/28/22 1441  Weight: 78 lb 6.4 oz (35.6 kg)     GENERAL: well appearing elderly Caucasian female in NAD  SKIN: skin color, texture, turgor are normal, no rashes or significant lesions EYES: conjunctiva are pink and non-injected, sclera clear LUNGS: clear to auscultation and percussion with normal breathing effort HEART: regular rate & rhythm and no murmurs and no lower extremity edema Musculoskeletal: no cyanosis of digits  and no clubbing. Ulnar deviation of fingers bilaterally   PSYCH: alert & oriented x 3, fluent speech NEURO: no focal motor/sensory deficits  LABORATORY DATA:  I have reviewed the data as listed    Latest Ref Rng & Units 11/28/2022    2:04 PM 10/27/2022   12:28 PM 05/29/2022    1:20 PM  CBC  WBC 4.0 - 10.5 K/uL 9.8  12.1  11.8   Hemoglobin 12.0 - 15.0  g/dL 13.7  13.7  13.6   Hematocrit 36.0 - 46.0 % 41.4  41.8  40.5   Platelets 150 - 400 K/uL 297  325.0  269        Latest Ref Rng & Units 11/28/2022    2:04 PM 05/29/2022    1:20 PM 02/11/2022    2:03 PM  CMP  Glucose 70 - 99 mg/dL 75  107  81   BUN 8 - 23 mg/dL _0 Creatinine 0.44 - 1.00 mg/dL 0.61  0.69  0.72   Sodium 135 - 145 mmol/L 137  138  139   Potassium 3.5 - 5.1 mmol/L 4.0  4.4  4.8   Chloride 98 - 111 mmol/L 101  104  102   CO2 22 - 32 mmol/L _1 Calcium 8.9 - 10.3 mg/dL 10.5  9.8  9.9   Total Protein 6.5 - 8.1 g/dL 8.3  7.6  8.0   Total Bilirubin 0.3 - 1.2 mg/dL 0.4  0.4  0.3   Alkaline Phos 38 - 126 U/L 152  114    AST 15 - 41 U/L _2 ALT 0 - 44 U/L _3 Lab Results  Component Value Date   MPROTEIN 0.6 (H) 05/29/2022   MPROTEIN 1.2 (H) 11/28/2021   MPROTEIN 0.6 (H) 10/12/2020   Lab Results  Component Value Date   KPAFRELGTCHN 76.5 (H) 05/29/2022   KPAFRELGTCHN 104.4 (H) 11/28/2021   KPAFRELGTCHN 49.3 (H) 10/12/2020   LAMBDASER 28.1 (H) 05/29/2022   LAMBDASER 45.2 (H) 11/28/2021   LAMBDASER 20.3 10/12/2020   KAPLAMBRATIO 2.72 (H) 05/29/2022   KAPLAMBRATIO 2.31 (H) 11/28/2021   KAPLAMBRATIO 19.80 11/05/2020    RADIOGRAPHIC STUDIES: No results found.  ASSESSMENT & PLAN Abigail Lamb 71 y.o. female with medical history significant for IgM kappa monoclonal gammopathy who presents for a follow up visit.    After review the labs, review the records, discussion the patient the findings are most consistent with a monoclonal myopathy of undetermined significance.  She underwent bone marrow biopsy which did show a 5% plasma cell population, however this was polytypic in nature.  As such the patient does not require any treatment but only observation from this point moving forward.  The only missing piece of her work-up at this time is her metastatic survey which she requested be performed after December 29, 2020 (but was lost to  follow up).  Overall she is at her baseline level of health at this time with no other issues that need addressed.  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 No CRAB criteria met. Bone marrow biopsy on 10/24/2020 revealed normocellular marrow with polytypic plasmacytosis (5%).  --assure yearly metastatic survey and UPEP. Testing due in Dec 2023.  --assure SPEP, SFLC, CMP, CBC, and LDH at each visit --labs today show white blood cell 9.8, hemoglobin 13.7, MCV 90.4,  and platelets of 297 --RTC q 6 months with labs 1 week before   #Leukocytosis- improved #Thrombocytosis-resolved --WBC 9.8, Plt 297 --recheck and evaluated on labs today.  --continue to monitor   Orders Placed This Encounter  Procedures   DG Bone Survey Met    Standing Status:   Future    Standing Expiration Date:   11/29/2023    Order Specific Question:   Reason for Exam (SYMPTOM  OR DIAGNOSIS REQUIRED)    Answer:   MGUS assess for lytic lesions    Order Specific Question:   Preferred imaging location?    Answer:   [facility]    All questions were answered. The patient knows to call the clinic with any problems, questions or concerns.  A total of more than 30 minutes were spent on this encounter and over half of that time was spent on counseling and coordination of care as outlined above.   Ledell Peoples, MD Department of Hematology/Oncology [place] [facility] Phone: [phone] Pager: [phone] Email: [email or web address]  11/30/2022 7:18 PM    Literature Support:  1) Kyle RA, Durie BG, Rajkumar SV, et al. Monoclonal gammopathy of undetermined significance (MGUS) and smoldering (asymptomatic) multiple myeloma: IMWG consensus perspectives risk factors for progression and guidelines for monitoring and management. Leukemia. 2010;24(6):1121-1127. doi:10.1038/leu.2010.60   --If a patient with  apparent MGUS has a serum monoclonal protein >15 g/l, IgA or IgM protein type, or an abnormal FLC ratio, a BM aspirate and biopsy should be carried out at baseline to rule out underlying PC malignancy.   2) Marylyn Ishihara RA, Therneau TM, Rajkumar SV, et al. Prevalence of monoclonal gammopathy of undetermined significance. N Engl J Med [id number]   --MGUS was found in 3.2 percent of persons 9 years of age or older and 5.3 percent of persons 32 years of age or older.

## 2022-11-29 LAB — BETA 2 MICROGLOBULIN, SERUM: Beta-2 Microglobulin: 2.3 mg/L (ref 0.6–2.4)

## 2022-12-01 LAB — UPEP/UIFE/LIGHT CHAINS/TP, 24-HR UR
% BETA, Urine: 22.7 %
ALPHA 1 URINE: 3.3 %
Albumin, U: 38.3 %
Alpha 2, Urine: 11.5 %
Free Kappa Lt Chains,Ur: 37.21 mg/L (ref 1.17–86.46)
Free Kappa/Lambda Ratio: 15.57 — ABNORMAL HIGH (ref 1.83–14.26)
Free Lambda Lt Chains,Ur: 2.39 mg/L (ref 0.27–15.21)
GAMMA GLOBULIN URINE: 24.2 %
M-SPIKE %, Urine: 11.2 % — ABNORMAL HIGH
M-Spike, Mg/24 Hr: 10 mg/24 hr — ABNORMAL HIGH
Total Protein, Urine-Ur/day: 93 mg/24 hr (ref 30–150)
Total Protein, Urine: 5.8 mg/dL
Total Volume: 1600

## 2022-12-01 LAB — KAPPA/LAMBDA LIGHT CHAINS
Kappa free light chain: 71.9 mg/L — ABNORMAL HIGH (ref 3.3–19.4)
Kappa, lambda light chain ratio: 2.72 — ABNORMAL HIGH (ref 0.26–1.65)
Lambda free light chains: 26.4 mg/L — ABNORMAL HIGH (ref 5.7–26.3)

## 2022-12-02 LAB — MULTIPLE MYELOMA PANEL, SERUM
Albumin SerPl Elph-Mcnc: 3.3 g/dL (ref 2.9–4.4)
Albumin/Glob SerPl: 0.9 (ref 0.7–1.7)
Alpha 1: 0.3 g/dL (ref 0.0–0.4)
Alpha2 Glob SerPl Elph-Mcnc: 0.7 g/dL (ref 0.4–1.0)
B-Globulin SerPl Elph-Mcnc: 1.3 g/dL (ref 0.7–1.3)
Gamma Glob SerPl Elph-Mcnc: 1.7 g/dL (ref 0.4–1.8)
Globulin, Total: 4.1 g/dL — ABNORMAL HIGH (ref 2.2–3.9)
IgA: 618 mg/dL — ABNORMAL HIGH (ref 87–352)
IgG (Immunoglobin G), Serum: 1153 mg/dL (ref 586–1602)
IgM (Immunoglobulin M), Srm: 949 mg/dL — ABNORMAL HIGH (ref 26–217)
M Protein SerPl Elph-Mcnc: 1.1 g/dL — ABNORMAL HIGH
Total Protein ELP: 7.4 g/dL (ref 6.0–8.5)

## 2022-12-05 ENCOUNTER — Ambulatory Visit (HOSPITAL_COMMUNITY)
Admission: RE | Admit: 2022-12-05 | Discharge: 2022-12-05 | Disposition: A | Discharge status: Home / Self Care | Payer: Medicare Other | Source: Ambulatory Visit | Attending: Hematology and Oncology | Admitting: Hematology and Oncology

## 2022-12-05 ENCOUNTER — Telehealth: Payer: Self-pay | Admitting: *Deleted

## 2022-12-05 DIAGNOSIS — D472 Monoclonal gammopathy: Secondary | ICD-10-CM | POA: Diagnosis not present

## 2022-12-05 NOTE — Telephone Encounter (Signed)
Received call back from patient

## 2022-12-05 NOTE — Telephone Encounter (Signed)
TCT patient regarding recent lab results. No answer but was able to leave vm for pt to return call at her convenience @ [phone]

## 2022-12-05 NOTE — Telephone Encounter (Signed)
Spoke with patient and advised that her MGUS labs are stable and she does not need to come back for 6 months. Pt states she had her bone survey done today. Checked for scan results. Advised that there not results available yet. Advised to call back next week and they should be in her chart by then. Pt voiced understanding.

## 2022-12-05 NOTE — Telephone Encounter (Signed)
-----   Message from Orson Slick, MD sent at 12/05/2022  9:06 AM EST ----- Please let Mrs. Wanat know that her MGUS labs are stable. We will see her back in 6 months time.  ----- Message ----- From: Buel Ream, Lab In [place] Sent: 11/28/2022   2:29 PM EST To: Orson Slick, MD

## 2022-12-08 ENCOUNTER — Other Ambulatory Visit: Payer: Self-pay | Admitting: Student

## 2022-12-10 NOTE — Progress Notes (Unsigned)
Synopsis: Referred for cough by Janith Lima, MD  Subjective:   PATIENT ID: Abigail Lamb GENDER: female DOB: Feb 10, 1951, MRN: [id number]  No chief complaint on file.  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 Otherwise typically doesn't get cough.   Does have DOE and uses albuterol as needed. She has tried spiriva in past, breo. She does think they were helpful for her breathing. She has not needed any steroids this year for breathing. Was last on prednisone for RA 2-3 ya.   REcurrent infections have limited use of biologics. She was also on MTX from 2007-2021.   She occasionally has trouble swallowing food/liquid but minimizes importance of this.   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   Smoking half a ppd currently.   Interval HPI: Started on stiolto, flutter, sputum cultures not obtained. CT Chest looks better.  She ultimately couldn't do the stiolto due to her arthritis. She is able to demonstrate good technique today with ellipta inhaler.   She has been having more 'better' days lately. Has started forteo for OP.   Doesn't have much of a cough currently, just some sinus congestion.   Otherwise pertinent review of systems is negative.  Past Medical History:  Diagnosis Date   Celiac disease    GERD (gastroesophageal reflux disease)    MGUS (monoclonal gammopathy of unknown significance)    per patient    Osteoporosis    per patient, diagnosed by Dr. Shona Simpson in [facility]   PUD (peptic ulcer disease)    Pulmonary emphysema ([place])    Recurrent depression ([place])      Family History  Problem Relation Age of Onset   CVA Mother    CAD Mother    Diabetes Mother    COPD Mother    Fibromyalgia Mother    Depression Mother     Hypertension Mother    Emphysema Mother        smoked   Heart attack Mother    Prostate cancer Father    Cancer - Prostate Father    Rheum arthritis Sister    Skin cancer Brother    Kidney Stones Brother    Hernia Brother    Depression Daughter    Healthy Son    Healthy Son      Past Surgical History:  Procedure Laterality Date   ABCESS DRAINAGE Left    under left arm   TONSILLECTOMY  1977    Social History   Socioeconomic History   Marital status: Divorced    Spouse name: Not on file   Number of children: Not on file   Years of education: Not on file   Highest education level: Not on file  Occupational History   Not on file  Tobacco Use   Smoking status: Every Day    Packs/day: 0.50    Years: 49.00    Total pack years: 24.50    Types: Cigarettes    Passive exposure: Current   Smokeless tobacco: Never   Tobacco comments:    Currently 1/2- 1 ppd 06/05/22 [place], CMA   Vaping Use   Vaping Use: Never used  Substance and Sexual Activity   Alcohol use: Not Currently  Drug use: Never   Sexual activity: Not Currently    Partners: Male  Other Topics Concern   Not on file  Social History Narrative   Not on file   Social Determinants of Health   Financial Resource Strain: Low Risk  (03/11/2022)   Overall Financial Resource Strain (CARDIA)    Difficulty of Paying Living Expenses: Not hard at all  Food Insecurity: No Food Insecurity (03/11/2022)   Hunger Vital Sign    Worried About Running Out of Food in the Last Year: Never true    Ran Out of Food in the Last Year: Never true  Transportation Needs: No Transportation Needs (03/11/2022)   PRAPARE - Hydrologist (Medical): No    Lack of Transportation (Non-Medical): No  Physical Activity: Inactive (03/11/2022)   Exercise Vital Sign    Days of Exercise per Week: 0 days    Minutes of Exercise per Session: 0 min  Stress: No Stress Concern Present (03/11/2022)   [place]    Feeling of Stress : Not at all  Social Connections: Socially Isolated (03/11/2022)   Social Connection and Isolation Panel [NHANES]    Frequency of Communication with Friends and Family: More than three times a week    Frequency of Social Gatherings with Friends and Family: Twice a week    Attends Religious Services: Never    Marine scientist or Organizations: No    Attends Archivist Meetings: Never    Marital Status: Divorced  Human resources officer Violence: Not At Risk (03/11/2022)   Humiliation, Afraid, Rape, and Kick questionnaire    Fear of Current or Ex-Partner: No    Emotionally Abused: No    Physically Abused: No    Sexually Abused: No     Allergies  Allergen Reactions   Dust Mite Extract     Multiple environmental allergies.      Outpatient Medications Prior to Visit  Medication Sig Dispense Refill   albuterol (VENTOLIN HFA) 108 (90 Base) MCG/ACT inhaler Take 1-2 puffs every 4-6 hrs prn shortness of breath. Needs ventolin.     celecoxib (CELEBREX) 50 MG capsule Take 1 capsule (50 mg total) by mouth 2 (two) times daily. 60 capsule 3   Cholecalciferol (VITAMIN D3) 125 MCG (5000 UT) CAPS Take 5,000 Units by mouth daily.     clindamycin (CLEOCIN T) 1 % external solution as needed.      colesevelam (WELCHOL) 625 MG tablet TAKE 3 TABLETS BY MOUTH TWICE A DAY WITH MEAL 180 tablet 0   dicyclomine (BENTYL) 10 MG capsule TAKE 1 CAPSULE (10 MG TOTAL) BY MOUTH 4 TIMES A DAY BEFORE MEALS AND AT BEDTIME 270 capsule 0   dronabinol (MARINOL) 2.5 MG capsule TAKE 1 CAPSULE BY MOUTH 2 TIMES DAILY BEFORE LUNCH AND SUPPER (Patient not taking: Reported on 11/25/2022) 180 capsule 0   DULoxetine (CYMBALTA) 60 MG capsule TAKE 1 CAPSULE(60 MG) BY MOUTH DAILY 90 capsule 1   famotidine (PEPCID) 40 MG tablet TAKE 1 TABLET BY MOUTH TWICE A DAY 180 tablet 1   fluocinonide-emollient (LIDEX-E) 0.05 % cream Apply 1 application  topically 2 (two) times daily. 60 g 3   fluticasone (FLONASE) 50 MCG/ACT nasal spray Place 1 spray into both nostrils daily. 16 g 11   folic acid (FOLVITE) 1 MG tablet Take by mouth.     FORTEO 600 MCG/2.4ML SOPN INJECT 20MCG UNDER THE SKIN ONCE DAILY. DISCARD DEVICE  28 DAYS AFTER INITIAL USE. 9.6 mL 0   hydroxychloroquine (PLAQUENIL) 200 MG tablet TAKE 1 TABLET BY MOUTH TWICE A DAY 180 tablet 0   hydrOXYzine (ATARAX) 10 MG tablet TAKE 1 TABLET BY MOUTH EVERY 8 HOURS AS NEEDED 270 tablet 0   Insulin Pen Needle 31G X 5 MM MISC Use to inject Forteo once daily. DO NOT REUSE NEEDLE. 100 each 2   loratadine (CLARITIN) 10 MG tablet Take 10 mg by mouth daily. (Patient not taking: Reported on 11/25/2022)     ondansetron (ZOFRAN) 4 MG tablet TAKE 1 TABLET BY MOUTH EVERY 8 HOURS AS NEEDED FOR NAUSEA AND VOMITING 40 tablet 2   tapentadol (NUCYNTA ER) 50 MG 12 hr tablet Take 1 tablet (50 mg total) by mouth every 12 (twelve) hours. (Patient not taking: Reported on 11/25/2022) 60 tablet 0   TUBERCULIN SYR 1CC/25GX5/8" 25G X 5/8" 1 ML MISC Use 1 syringe weekly for Methotrexate injection. (Patient not taking: Reported on 11/25/2022) 12 each 3   umeclidinium-vilanterol (ANORO ELLIPTA) 62.5-25 MCG/ACT AEPB Inhale 1 puff into the lungs daily. 1 each 11   varenicline (CHANTIX PAK) 0.5 MG X 11 & 1 MG X 42 tablet Take one 0.5 mg tablet by mouth once daily for 3 days, then increase to one 0.5 mg tablet twice daily for 4 days, then increase to one 1 mg tablet twice daily. (Patient not taking: Reported on 11/25/2022) 53 tablet 0   varenicline (CHANTIX) 1 MG tablet TAKE 1 TABLET BY MOUTH TWICE A DAY 180 tablet 0   No facility-administered medications prior to visit.       Objective:   Physical Exam:  General appearance: 71 y.o., female, NAD, conversant, cachectic Eyes: anicteric sclerae; PERRL, tracking appropriately HENT: NCAT; MMM Neck: Trachea midline; no lymphadenopathy, no JVD Lungs: diminished bl, no  crackles, no wheeze, with normal respiratory effort CV: RRR, no murmur  Abdomen: Soft, non-tender; non-distended, BS present  Extremities: No peripheral edema, warm. +RA changes Skin: Normal turgor and texture; no rash Psych: Appropriate affect Neuro: Alert and oriented to person and place, no focal deficit     There were no vitals filed for this visit.     on RA BMI Readings from Last 3 Encounters:  11/28/22 14.34 kg/m  11/25/22 14.92 kg/m  10/27/22 14.45 kg/m   Wt Readings from Last 3 Encounters:  11/28/22 78 lb 6.4 oz (35.6 kg)  11/25/22 81 lb 9.6 oz (37 kg)  10/27/22 79 lb (35.8 kg)     CBC    Component Value Date/Time   WBC 9.8 11/28/2022 1404   WBC 12.1 (H) 10/27/2022 1228   RBC 4.58 11/28/2022 1404   HGB 13.7 11/28/2022 1404   HCT 41.4 11/28/2022 1404   PLT 297 11/28/2022 1404   MCV 90.4 11/28/2022 1404   MCH 29.9 11/28/2022 1404   MCHC 33.1 11/28/2022 1404   RDW 13.9 11/28/2022 1404   LYMPHSABS 3.0 11/28/2022 1404   MONOABS 0.8 11/28/2022 1404   EOSABS 0.2 11/28/2022 1404   BASOSABS 0.1 11/28/2022 1404    Chest Imaging: CT Chest 11/2021 reviewed by me with 63m RLL nodule, severe emphysema  Super D CT Chest 07/03/22 reviewed by me with resolution dominant nodules, nearly resolved TIB nodularity  Pulmonary Functions Testing Results:    Latest Ref Rng & Units 07/24/2022    2:54 PM  PFT Results  FVC-Pre L 2.19   FVC-Predicted Pre % 78   FVC-Post L 2.17   FVC-Predicted Post % 77  Pre FEV1/FVC % % 58   Post FEV1/FCV % % 61   FEV1-Pre L 1.28   FEV1-Predicted Pre % 60   FEV1-Post L 1.32   DLCO uncorrected ml/min/mmHg 9.47   DLCO UNC% % 51   DLCO corrected ml/min/mmHg 9.42   DLCO COR %Predicted % 51   DLVA Predicted % 56   TLC L 4.84   TLC % Predicted % 101   RV % Predicted % 132        Assessment & Plan:   # COPD Gold D # Severe emphysema Couldn't make stiolto work due to her arthritis.   # Dominant RLL 37m pulmonary nodule #  Scattered TIB nodularity Radiographically improved with very limited burden of TIB currently. Still appears at high risk for NTM but not impressively symptomatic in this regard at present.  # Smoking Smoking cessation instruction/counseling given:  counseled patient on the dangers of tobacco use, advised patient to stop smoking, and reviewed strategies to maximize success   Plan: - take shower, clear nose of crusting and then flonase 1 spray each nostril daily till next appointmentn - anoro 1 puff once daily, demonstrated technique correctly in clinic today - stop stiolto - albuterol 1-2 puffs as needed  - start chantix: Take one 0.5 mg tablet by mouth once daily for 3 days, then increase to one 0.5 mg tablet twice daily for 4 days, then increase to one 1 mg tablet twice daily. Skip evening dose if you get nightmares. Can keep smoking first week then ideally stop.  - if chest congestion, productive cough, unintentional weight loss, drenching night sweats, persistent low grade fever or any combination of these then let [place] by my chart message or phone call. We would then consider repeat CT Chest and potentially bronchoscopy and bronchoalveolar lavage - lung cancer screening referral placed today - see you in 3 months or sooner if need be!  RTC 4 weeks   NMaryjane Hurter MD [facility] 12/10/2022 5:32 PM

## 2022-12-11 ENCOUNTER — Ambulatory Visit (INDEPENDENT_AMBULATORY_CARE_PROVIDER_SITE_OTHER): Payer: Medicare Other | Admitting: Student

## 2022-12-11 ENCOUNTER — Encounter: Payer: Self-pay | Admitting: Student

## 2022-12-11 VITALS — BP 120/70 | HR 87 | Temp 98.1°F | Ht 62.0 in | Wt 79.6 lb

## 2022-12-11 DIAGNOSIS — F17200 Nicotine dependence, unspecified, uncomplicated: Secondary | ICD-10-CM | POA: Diagnosis not present

## 2022-12-11 DIAGNOSIS — J449 Chronic obstructive pulmonary disease, unspecified: Secondary | ICD-10-CM

## 2022-12-11 DIAGNOSIS — R918 Other nonspecific abnormal finding of lung field: Secondary | ICD-10-CM

## 2022-12-11 NOTE — Patient Instructions (Addendum)
-   take shower, clear nose of crusting and then flonase 1 spray each nostril daily till next appointment - anoro 1 puff once daily  - albuterol 1-2 puffs as needed  - let me know as you reach the end of the chantix if you'd like me to refill it - ok to take it once daily  - if chest congestion, productive cough, unintentional weight loss, drenching night sweats, persistent low grade fever or any combination of these then let us know by my chart message or phone call. We would then consider repeat CT Chest and potentially bronchoscopy and bronchoalveolar lavage - lung cancer screening referral placed last visit - see you in 6 months or sooner if need be!

## 2022-12-25 ENCOUNTER — Other Ambulatory Visit: Payer: Self-pay | Admitting: Internal Medicine

## 2022-12-25 DIAGNOSIS — K582 Mixed irritable bowel syndrome: Secondary | ICD-10-CM

## 2023-01-16 ENCOUNTER — Other Ambulatory Visit: Payer: Self-pay | Admitting: Physician Assistant

## 2023-01-16 ENCOUNTER — Other Ambulatory Visit: Payer: Self-pay | Admitting: Internal Medicine

## 2023-01-16 DIAGNOSIS — F3341 Major depressive disorder, recurrent, in partial remission: Secondary | ICD-10-CM

## 2023-01-16 DIAGNOSIS — M0579 Rheumatoid arthritis with rheumatoid factor of multiple sites without organ or systems involvement: Secondary | ICD-10-CM

## 2023-01-16 DIAGNOSIS — K279 Peptic ulcer, site unspecified, unspecified as acute or chronic, without hemorrhage or perforation: Secondary | ICD-10-CM

## 2023-01-16 DIAGNOSIS — K3184 Gastroparesis: Secondary | ICD-10-CM

## 2023-01-16 DIAGNOSIS — L280 Lichen simplex chronicus: Secondary | ICD-10-CM

## 2023-01-16 NOTE — Telephone Encounter (Signed)
Next Visit: 03/04/2023  Last Visit: 11/25/2022  Labs: 11/28/2022 Calcium 10.5, Total Protein 8.3, Alk. Phos. 152  Eye exam:  08/14/2022 WNL    Current Dose per office note 11/25/2022: Plaquenil 200 mg 1 tablet by mouth twice daily.   DX: Rheumatoid arthritis with rheumatoid factor of multiple sites without organ or systems involvement   Last Fill: 09/12/2022  Okay to refill Plaquenil?

## 2023-01-21 ENCOUNTER — Other Ambulatory Visit: Payer: Self-pay | Admitting: Internal Medicine

## 2023-01-21 DIAGNOSIS — E785 Hyperlipidemia, unspecified: Secondary | ICD-10-CM

## 2023-02-04 ENCOUNTER — Encounter: Payer: Self-pay | Admitting: Internal Medicine

## 2023-02-09 ENCOUNTER — Encounter: Payer: Self-pay | Admitting: Family Medicine

## 2023-02-09 ENCOUNTER — Ambulatory Visit (INDEPENDENT_AMBULATORY_CARE_PROVIDER_SITE_OTHER): Payer: Medicare Other | Admitting: Family Medicine

## 2023-02-09 VITALS — BP 100/62 | HR 68 | Resp 20 | Ht 62.0 in | Wt 75.0 lb

## 2023-02-09 DIAGNOSIS — J441 Chronic obstructive pulmonary disease with (acute) exacerbation: Secondary | ICD-10-CM

## 2023-02-09 DIAGNOSIS — R6889 Other general symptoms and signs: Secondary | ICD-10-CM

## 2023-02-09 DIAGNOSIS — U071 COVID-19: Secondary | ICD-10-CM | POA: Diagnosis not present

## 2023-02-09 LAB — POC COVID19 BINAXNOW: SARS Coronavirus 2 Ag: POSITIVE — AB

## 2023-02-09 LAB — POCT INFLUENZA A/B
Influenza A, POC: NEGATIVE
Influenza B, POC: NEGATIVE

## 2023-02-09 MED ORDER — AZITHROMYCIN 250 MG PO TABS: ORAL_TABLET | ORAL | 0 refills | Status: AC

## 2023-02-09 MED ORDER — DEXAMETHASONE 6 MG PO TABS: 6.0000 mg | ORAL_TABLET | Freq: Every day | ORAL | 0 refills | Status: AC

## 2023-02-09 NOTE — Progress Notes (Signed)
Assessment & Plan:  1-2. COVID-19/COPD with acute exacerbation [facility]) Education provided on COVID-19 and COPD exacerbations. Continue symptom management. Advised to use Albuterol more regularly while she is sick. Discussed antivirals are no longer indicated since her symptoms started >5 days ago. Advised to go to the ER for worsening shortness of breath or chest pain. Start azithromycin and dexamethasone today. - azithromycin (ZITHROMAX) 250 MG tablet; Take 2 tablets (500 mg total) by mouth daily for 1 day, THEN 1 tablet (250 mg total) daily for 4 days.  Dispense: 6 tablet; Refill: 0 - dexamethasone (DECADRON) 6 MG tablet; Take 1 tablet (6 mg total) by mouth daily for 5 days.  Dispense: 5 tablet; Refill: 0  3. Flu-like symptoms - POC COVID-19 BinaxNow - POCT Influenza A/B  Results for orders placed or performed in visit on 02/09/23  POC COVID-19 BinaxNow  Result Value Ref Range   SARS Coronavirus 2 Ag Positive (A) Negative  POCT Influenza A/B  Result Value Ref Range   Influenza A, POC Negative Negative   Influenza B, POC Negative Negative    Follow up plan: Return if symptoms worsen or fail to improve.  Hendricks Limes, MSN, APRN, FNP-C  Subjective:  HPI: Abigail Lamb is a 72 y.o. female presenting on 02/09/2023 for No chief complaint on file.  Patient is accompanied by her daughter, who she is okay with being present. Patient complains of cough, head/chest congestion, headache, runny nose, sneezing, sore throat, fever, postnasal drainage, shortness of breath, wheezing, nausea, diarrhea, fatigue, chills, and dizziness.  Wheezing and shortness of breath are worse than baseline. Onset of symptoms was  1.5  weeks ago, gradually worsening since that time. She is drinking plenty of fluids. Evaluation to date: none. Treatment to date: antihistamines, nasal steroids, Tylenol, Anoro, and Albuterol. She has a history of COPD. She does smoke. Her granddaughter tested positive for COVID two weeks  ago.     ROS: Negative unless specifically indicated above in HPI.   Relevant past medical history reviewed and updated as indicated.   Allergies and medications reviewed and updated.   Current Outpatient Medications:    albuterol (VENTOLIN HFA) 108 (90 Base) MCG/ACT inhaler, Take 1-2 puffs every 4-6 hrs prn shortness of breath. Needs ventolin., Disp: , Rfl:    celecoxib (CELEBREX) 50 MG capsule, Take 1 capsule (50 mg total) by mouth 2 (two) times daily., Disp: 60 capsule, Rfl: 3   Cholecalciferol (VITAMIN D3) 125 MCG (5000 UT) CAPS, Take 5,000 Units by mouth daily., Disp: , Rfl:    clindamycin (CLEOCIN T) 1 % external solution, as needed. , Disp: , Rfl:    colesevelam (WELCHOL) 625 MG tablet, TAKE 3 TABLETS BY MOUTH TWICE A DAY WITH MEAL, Disp: 180 tablet, Rfl: 0   dicyclomine (BENTYL) 10 MG capsule, TAKE 1 CAPSULE (10 MG TOTAL) BY MOUTH 4 TIMES A DAY BEFORE MEALS AND AT BEDTIME, Disp: 270 capsule, Rfl: 0   dronabinol (MARINOL) 2.5 MG capsule, TAKE 1 CAPSULE BY MOUTH 2 TIMES DAILY BEFORE LUNCH AND SUPPER, Disp: 180 capsule, Rfl: 0   DULoxetine (CYMBALTA) 60 MG capsule, TAKE 1 CAPSULE(60 MG) BY MOUTH DAILY, Disp: 90 capsule, Rfl: 1   famotidine (PEPCID) 40 MG tablet, TAKE 1 TABLET BY MOUTH TWICE A DAY, Disp: 180 tablet, Rfl: 1   fluocinonide-emollient (LIDEX-E) 0.05 % cream, Apply 1 application topically 2 (two) times daily., Disp: 60 g, Rfl: 3   fluticasone (FLONASE) 50 MCG/ACT nasal spray, Place 1 spray into both nostrils daily., Disp:  16 g, Rfl: 11   folic acid (FOLVITE) 1 MG tablet, Take by mouth., Disp: , Rfl:    FORTEO 600 MCG/2.4ML SOPN, INJECT 20MCG UNDER THE SKIN ONCE DAILY. DISCARD DEVICE 28 DAYS AFTER INITIAL USE., Disp: 9.6 mL, Rfl: 0   hydroxychloroquine (PLAQUENIL) 200 MG tablet, TAKE 1 TABLET BY MOUTH TWICE A DAY, Disp: 180 tablet, Rfl: 0   hydrOXYzine (ATARAX) 10 MG tablet, TAKE 1 TABLET BY MOUTH EVERY 8 HOURS AS NEEDED, Disp: 270 tablet, Rfl: 0   Insulin Pen Needle 31G X 5  MM MISC, Use to inject Forteo once daily. DO NOT REUSE NEEDLE., Disp: 100 each, Rfl: 2   loratadine (CLARITIN) 10 MG tablet, Take 10 mg by mouth daily., Disp: , Rfl:    ondansetron (ZOFRAN) 4 MG tablet, TAKE 1 TABLET BY MOUTH EVERY 8 HOURS AS NEEDED FOR NAUSEA AND VOMITING, Disp: 40 tablet, Rfl: 2   TUBERCULIN SYR 1CC/25GX5/8" 25G X 5/8" 1 ML MISC, Use 1 syringe weekly for Methotrexate injection., Disp: 12 each, Rfl: 3   umeclidinium-vilanterol (ANORO ELLIPTA) 62.5-25 MCG/ACT AEPB, Inhale 1 puff into the lungs daily., Disp: 1 each, Rfl: 11   varenicline (CHANTIX) 1 MG tablet, TAKE 1 TABLET BY MOUTH TWICE A DAY, Disp: 180 tablet, Rfl: 0  Allergies  Allergen Reactions   Dust Mite Extract     Multiple environmental allergies.     Objective:   BP 100/62   Pulse 68   Resp 20   Ht 5' 2"$  (1.575 m)   Wt 75 lb (34 kg)   SpO2 94%   BMI 13.72 kg/m    Physical Exam Vitals reviewed.  Constitutional:      General: She is not in acute distress.    Appearance: Normal appearance. She is underweight. She is not ill-appearing, toxic-appearing or diaphoretic.  HENT:     Head: Normocephalic and atraumatic.     Right Ear: Tympanic membrane, ear canal and external ear normal. There is no impacted cerumen.     Left Ear: Tympanic membrane, ear canal and external ear normal. There is no impacted cerumen.     Nose: Congestion present. No rhinorrhea.     Right Sinus: Maxillary sinus tenderness and frontal sinus tenderness present.     Left Sinus: Maxillary sinus tenderness and frontal sinus tenderness present.     Mouth/Throat:     Mouth: Mucous membranes are moist.     Pharynx: Oropharynx is clear. Posterior oropharyngeal erythema present. No oropharyngeal exudate.  Eyes:     General: No scleral icterus.       Right eye: No discharge.        Left eye: No discharge.     Conjunctiva/sclera: Conjunctivae normal.  Cardiovascular:     Rate and Rhythm: Normal rate and regular rhythm.     Heart sounds:  Normal heart sounds. No murmur heard.    No friction rub. No gallop.  Pulmonary:     Effort: Pulmonary effort is normal. No respiratory distress.     Breath sounds: Normal breath sounds. No stridor. No wheezing, rhonchi or rales.  Musculoskeletal:        General: Normal range of motion.     Cervical back: Normal range of motion.  Lymphadenopathy:     Cervical: No cervical adenopathy.  Skin:    General: Skin is warm and dry.     Capillary Refill: Capillary refill takes less than 2 seconds.  Neurological:     General: No focal deficit present.  Mental Status: She is alert and oriented to person, place, and time. Mental status is at baseline.     Motor: Weakness (riding in wheel chair) present.  Psychiatric:        Mood and Affect: Mood normal.        Behavior: Behavior normal.        Thought Content: Thought content normal.        Judgment: Judgment normal.

## 2023-02-10 ENCOUNTER — Telehealth: Payer: Self-pay | Admitting: *Deleted

## 2023-02-10 NOTE — Telephone Encounter (Signed)
Patient advised she may restart Forteo whenever she feels better.  Patient advised should not interfere with the COVID-19 virus infection. Patient expressed understanding.

## 2023-02-10 NOTE — Telephone Encounter (Signed)
Patient contacted the office and states she has Covid and has been suffering for about 2 weeks now. Patient states she has been struggling to take her medications. Patient states that includes her Forteo. Patient would like to know if being off the [facility] for 2 weeks will cause issues and should she try to restart it now or when she has recovered from Covid? Please advise.

## 2023-02-10 NOTE — Telephone Encounter (Signed)
She may restart Forteo whenever she feels better.  Should not interfere with the COVID-19 virus infection.

## 2023-02-18 ENCOUNTER — Other Ambulatory Visit: Payer: Self-pay | Admitting: Internal Medicine

## 2023-02-18 DIAGNOSIS — M0579 Rheumatoid arthritis with rheumatoid factor of multiple sites without organ or systems involvement: Secondary | ICD-10-CM

## 2023-02-18 DIAGNOSIS — K582 Mixed irritable bowel syndrome: Secondary | ICD-10-CM

## 2023-02-18 NOTE — Progress Notes (Deleted)
Office Visit Note  Patient: Abigail Lamb             Date of Birth: July 16, 1951           MRN: [id number]             PCP: Janith Lima, MD Referring: Janith Lima, MD Visit Date: 03/04/2023 Occupation: @GUAROCC$ @  Subjective:  No chief complaint on file.   History of Present Illness: Abigail Lamb is a 72 y.o. female ***     Activities of Daily Living:  Patient reports morning stiffness for *** {minute/hour:19697}.   Patient {ACTIONS;DENIES/REPORTS:21021675::"Denies"} nocturnal pain.  Difficulty dressing/grooming: {ACTIONS;DENIES/REPORTS:21021675::"Denies"} Difficulty climbing stairs: {ACTIONS;DENIES/REPORTS:21021675::"Denies"} Difficulty getting out of chair: {ACTIONS;DENIES/REPORTS:21021675::"Denies"} Difficulty using hands for taps, buttons, cutlery, and/or writing: {ACTIONS;DENIES/REPORTS:21021675::"Denies"}  No Rheumatology ROS completed.   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 ([place]) 11/08/2021   Gastroparesis 11/07/2021   Irritable bowel syndrome with both constipation and diarrhea 11/07/2021   Encounter for general adult medical examination with abnormal findings 11/07/2021   Leukocytosis 0000000   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   Routine general medical examination at a health care facility 08/02/2020   Visit for screening mammogram 08/02/2020   Rheumatoid arthritis involving multiple sites with positive rheumatoid factor ([place]) 08/02/2020   Hyperlipidemia LDL goal <130 08/02/2020   Tobacco abuse 08/02/2020   Dupuytren's contracture of right hand 08/02/2020   Pulmonary emphysema (HCC)    Stage 2 moderate COPD by GOLD classification ([place]) 01/30/2020   Neurodermatitis 03/28/2019   Primary insomnia 03/28/2019   Recurrent depression ([place]) 03/28/2019     Past Medical History:  Diagnosis Date   Celiac disease    GERD (gastroesophageal reflux disease)    MGUS (monoclonal gammopathy of unknown significance)    per patient    Osteoporosis    per patient, diagnosed by Dr. Shona Simpson in [facility]   PUD (peptic ulcer disease)    Pulmonary emphysema ([place])    Recurrent depression ([place])     Family History  Problem Relation Age of Onset   CVA Mother    CAD Mother    Diabetes Mother    COPD Mother    Fibromyalgia Mother    Depression Mother    Hypertension Mother    Emphysema Mother        smoked   Heart attack Mother    Prostate cancer Father    Cancer - Prostate Father    Rheum arthritis Sister    Skin cancer Brother    Kidney Stones Brother    Hernia Brother    Depression Daughter    Healthy Son    Healthy Son    Past Surgical History:  Procedure Laterality Date   ABCESS DRAINAGE Left    under left arm   TONSILLECTOMY  1977   Social History   Social History Narrative   Not on file   Immunization History  Administered Date(s) Administered   Fluad Quad(high Dose 65+) 11/08/2021, 10/27/2022   Influenza, High Dose Seasonal PF 12/30/2019   Influenza-Unspecified 01/04/2018, 12/15/2018, 09/11/2020   PNEUMOCOCCAL CONJUGATE-20 11/08/2021   Pneumococcal Polysaccharide-23 08/02/2020   Tdap 11/05/2020   Zoster Recombinat (Shingrix) 03/20/2021, 11/27/2021     Objective: Vital Signs: There were no vitals taken for this visit.   Physical Exam   Musculoskeletal Exam: ***  CDAI Exam: CDAI  Score: -- Patient Global: --; Provider Global: -- Swollen: --; Tender: -- Joint Exam 03/04/2023   No joint exam has been documented for this visit   There is currently no information documented on the homunculus. Go to the Rheumatology activity and complete the homunculus joint exam.  Investigation: No additional findings.  Imaging: No results found.  Recent Labs: Lab Results  Component Value Date   WBC 9.8 11/28/2022   HGB 13.7  11/28/2022   PLT 297 11/28/2022   NA 137 11/28/2022   K 4.0 11/28/2022   CL 101 11/28/2022   CO2 26 11/28/2022   GLUCOSE 75 11/28/2022   BUN 18 11/28/2022   CREATININE 0.61 11/28/2022   BILITOT 0.4 11/28/2022   ALKPHOS 152 (H) 11/28/2022   AST 22 11/28/2022   ALT 13 11/28/2022   PROT 8.3 (H) 11/28/2022   ALBUMIN 4.1 11/28/2022   CALCIUM 10.5 (H) 11/28/2022   GFRAA 107 10/26/2020   QFTBGOLDPLUS NEGATIVE 09/17/2020    Speciality Comments: PLQ Eye Exam: 08/14/2022 WNL @ [facility] Follow up 1 year.   Humira and Orencia were discontinued in the past due to frequent infections per patient.  Forteo started 05/29/22  Procedures:  No procedures performed Allergies: Dust mite extract   Assessment / Plan:     Visit Diagnoses: No diagnosis found.  Orders: No orders of the defined types were placed in this encounter.  No orders of the defined types were placed in this encounter.   Face-to-face time spent with patient was *** minutes. Greater than 50% of time was spent in counseling and coordination of care.  Follow-Up Instructions: No follow-ups on file.   Bo Merino, MD  Note - This record has been created using Editor, commissioning.  Chart creation errors have been sought, but may not always  have been located. Such creation errors do not reflect on  the standard of medical care.

## 2023-02-19 ENCOUNTER — Encounter: Payer: Self-pay | Admitting: Internal Medicine

## 2023-02-19 NOTE — Patient Instructions (Signed)
      Medications changes include :       A referral was ordered for XXX.     Someone will call you to schedule an appointment.    No follow-ups on file.

## 2023-02-19 NOTE — Progress Notes (Signed)
Virtual Visit via telephone note  I connected with Abigail Lamb on 02/19/23 at  8:50 AM EST by a video enabled telemedicine application, but I was not able to hear her so we had to switch to telephone visit.  I verified that I am speaking with the correct person using two identifiers.   I discussed the limitations of evaluation and management by telemedicine and the availability of in person appointments. The patient expressed understanding and agreed to proceed.  Present for the visit:  Myself, Dr Billey Gosling, Abigail Chyenne Sobczak.  The patient is currently at home and I am in the office.    No referring provider.    History of Present Illness: This is an acute visit for anxiety/family situation  She has underlying anxiety, but feels her anxiety has increased and most likely has some depression as well.  She is currently taking duloxetine 60 mg daily.  Her son is a drug addict and has been so for years.  Recently he had an episode in [place] where he was very close to dying.  He is now living with her other son and hopefully getting the help that he needs.  This situation really caused increased anxiety and she thinks she needs something more than what she is taking now to help.  She was on Ativan in the past.  She does state some shortness of breath and chest pain with anxiety.  She states her appetite is good.  Her sleep is not always good and thinks that is related to the situation.    Review of Systems  Constitutional:        Appetite is ok  Respiratory:  Positive for shortness of breath (with anxiety).   Cardiovascular:  Positive for chest pain (with anxiety). Negative for palpitations.  Neurological:  Positive for dizziness (from chantix ?). Negative for headaches.  Psychiatric/Behavioral:  The patient is nervous/anxious and has insomnia (difficulty sleeping).        Social History   Socioeconomic History   Marital status: Divorced    Spouse name: Not on file   Number of  children: Not on file   Years of education: Not on file   Highest education level: Not on file  Occupational History   Not on file  Tobacco Use   Smoking status: Every Day    Packs/day: 0.50    Years: 49.00    Total pack years: 24.50    Types: Cigarettes    Passive exposure: Current   Smokeless tobacco: Never   Tobacco comments:    Currently 3 cigarettes a day.  Amy marsh, cma 12/11/2022  Vaping Use   Vaping Use: Never used  Substance and Sexual Activity   Alcohol use: Not Currently   Drug use: Never   Sexual activity: Not Currently    Partners: Male  Other Topics Concern   Not on file  Social History Narrative   Not on file   Social Determinants of Health   Financial Resource Strain: Low Risk  (03/11/2022)   Overall Financial Resource Strain (CARDIA)    Difficulty of Paying Living Expenses: Not hard at all  Food Insecurity: No Food Insecurity (03/11/2022)   Hunger Vital Sign    Worried About Running Out of Food in the Last Year: Never true    Ran Out of Food in the Last Year: Never true  Transportation Needs: No Transportation Needs (03/11/2022)   PRAPARE - Transportation    Lack of Transportation (Medical): No    Lack  of Transportation (Non-Medical): No  Physical Activity: Inactive (03/11/2022)   Exercise Vital Sign    Days of Exercise per Week: 0 days    Minutes of Exercise per Session: 0 min  Stress: No Stress Concern Present (03/11/2022)   [place]    Feeling of Stress : Not at all  Social Connections: Socially Isolated (03/11/2022)   Social Connection and Isolation Panel [NHANES]    Frequency of Communication with Friends and Family: More than three times a week    Frequency of Social Gatherings with Friends and Family: Twice a week    Attends Religious Services: Never    Marine scientist or Organizations: No    Attends Music therapist: Never    Marital Status: Divorced      Observations/Objective: Appears well in NAD   Assessment and Plan:  Anxiety, depression: Acute on chronic Currently taking duloxetine 60 mg daily Depression and anxiety not ideally controlled, most of which is related to the switch relation with her son and stressed that that is caused She does not feel that her current medication is sufficient Discussed options Will increase duloxetine to 90 mg daily-she was on this dose previously Will also start clonazepam 0.5 mg twice daily She has a follow-up with Dr. Ronnald Ramp in April, but will contact us sooner with any questions or concerns    Follow Up Instructions:    I discussed the assessment and treatment plan with the patient. The patient was provided an opportunity to ask questions and all were answered. The patient agreed with the plan and demonstrated an understanding of the instructions.   The patient was advised to call back or seek an in-person evaluation if the symptoms worsen or if the condition fails to improve as anticipated.  Time spent on telephone call: 18 minutes  Binnie Rail, MD

## 2023-02-20 ENCOUNTER — Ambulatory Visit (INDEPENDENT_AMBULATORY_CARE_PROVIDER_SITE_OTHER): Payer: Medicare Other | Admitting: Internal Medicine

## 2023-02-20 DIAGNOSIS — F419 Anxiety disorder, unspecified: Secondary | ICD-10-CM | POA: Diagnosis not present

## 2023-02-20 DIAGNOSIS — F339 Major depressive disorder, recurrent, unspecified: Secondary | ICD-10-CM

## 2023-02-20 MED ORDER — DULOXETINE HCL 30 MG PO CPEP: 30.0000 mg | ORAL_CAPSULE | Freq: Every day | ORAL | 0 refills | Status: DC

## 2023-02-20 MED ORDER — CLONAZEPAM 0.5 MG PO TABS: 0.5000 mg | ORAL_TABLET | Freq: Two times a day (BID) | ORAL | 1 refills | Status: DC | PRN

## 2023-03-02 ENCOUNTER — Telehealth: Payer: Self-pay

## 2023-03-02 NOTE — Telephone Encounter (Signed)
Contacted Jame Vanluven to schedule their annual wellness visit. Appointment made for 03/16/23.

## 2023-03-04 ENCOUNTER — Ambulatory Visit: Payer: Medicare Other | Admitting: Rheumatology

## 2023-03-04 DIAGNOSIS — M0630 Rheumatoid nodule, unspecified site: Secondary | ICD-10-CM

## 2023-03-04 DIAGNOSIS — F339 Major depressive disorder, recurrent, unspecified: Secondary | ICD-10-CM

## 2023-03-04 DIAGNOSIS — M0579 Rheumatoid arthritis with rheumatoid factor of multiple sites without organ or systems involvement: Secondary | ICD-10-CM

## 2023-03-04 DIAGNOSIS — F5101 Primary insomnia: Secondary | ICD-10-CM

## 2023-03-04 DIAGNOSIS — N200 Calculus of kidney: Secondary | ICD-10-CM

## 2023-03-04 DIAGNOSIS — D472 Monoclonal gammopathy: Secondary | ICD-10-CM

## 2023-03-04 DIAGNOSIS — E559 Vitamin D deficiency, unspecified: Secondary | ICD-10-CM

## 2023-03-04 DIAGNOSIS — Z720 Tobacco use: Secondary | ICD-10-CM

## 2023-03-04 DIAGNOSIS — R918 Other nonspecific abnormal finding of lung field: Secondary | ICD-10-CM

## 2023-03-04 DIAGNOSIS — M810 Age-related osteoporosis without current pathological fracture: Secondary | ICD-10-CM

## 2023-03-04 DIAGNOSIS — M170 Bilateral primary osteoarthritis of knee: Secondary | ICD-10-CM

## 2023-03-04 DIAGNOSIS — Z79899 Other long term (current) drug therapy: Secondary | ICD-10-CM

## 2023-03-04 DIAGNOSIS — E785 Hyperlipidemia, unspecified: Secondary | ICD-10-CM

## 2023-03-04 DIAGNOSIS — R7303 Prediabetes: Secondary | ICD-10-CM

## 2023-03-04 DIAGNOSIS — J449 Chronic obstructive pulmonary disease, unspecified: Secondary | ICD-10-CM

## 2023-03-05 ENCOUNTER — Telehealth: Payer: Self-pay

## 2023-03-05 NOTE — Telephone Encounter (Signed)
error 

## 2023-03-05 NOTE — Telephone Encounter (Signed)
Contacted Abigail Lamb to schedule their annual wellness visit. Appointment made for 03/16/23.  Called patient back after receiving voicemail from her, she wanted to know if her appointment was in office or telephone visit. I advised patient her appointment was a telephone visit.  Norton Blizzard, [place] ([facility])  [place] [facility] [phone]

## 2023-03-09 ENCOUNTER — Ambulatory Visit: Payer: Medicare Other | Attending: Rheumatology | Admitting: Physician Assistant

## 2023-03-09 ENCOUNTER — Encounter: Payer: Self-pay | Admitting: Physician Assistant

## 2023-03-09 ENCOUNTER — Other Ambulatory Visit: Payer: Self-pay | Admitting: *Deleted

## 2023-03-09 VITALS — BP 101/68 | HR 103 | Ht 62.0 in | Wt 83.0 lb

## 2023-03-09 DIAGNOSIS — Z720 Tobacco use: Secondary | ICD-10-CM

## 2023-03-09 DIAGNOSIS — R918 Other nonspecific abnormal finding of lung field: Secondary | ICD-10-CM | POA: Diagnosis not present

## 2023-03-09 DIAGNOSIS — M25552 Pain in left hip: Secondary | ICD-10-CM

## 2023-03-09 DIAGNOSIS — Z79899 Other long term (current) drug therapy: Secondary | ICD-10-CM | POA: Diagnosis not present

## 2023-03-09 DIAGNOSIS — N200 Calculus of kidney: Secondary | ICD-10-CM | POA: Diagnosis not present

## 2023-03-09 DIAGNOSIS — M170 Bilateral primary osteoarthritis of knee: Secondary | ICD-10-CM

## 2023-03-09 DIAGNOSIS — M0630 Rheumatoid nodule, unspecified site: Secondary | ICD-10-CM

## 2023-03-09 DIAGNOSIS — M810 Age-related osteoporosis without current pathological fracture: Secondary | ICD-10-CM

## 2023-03-09 DIAGNOSIS — R7303 Prediabetes: Secondary | ICD-10-CM

## 2023-03-09 DIAGNOSIS — J431 Panlobular emphysema: Secondary | ICD-10-CM

## 2023-03-09 DIAGNOSIS — E559 Vitamin D deficiency, unspecified: Secondary | ICD-10-CM

## 2023-03-09 DIAGNOSIS — J449 Chronic obstructive pulmonary disease, unspecified: Secondary | ICD-10-CM

## 2023-03-09 DIAGNOSIS — D472 Monoclonal gammopathy: Secondary | ICD-10-CM | POA: Diagnosis not present

## 2023-03-09 DIAGNOSIS — M0579 Rheumatoid arthritis with rheumatoid factor of multiple sites without organ or systems involvement: Secondary | ICD-10-CM | POA: Diagnosis not present

## 2023-03-09 DIAGNOSIS — F339 Major depressive disorder, recurrent, unspecified: Secondary | ICD-10-CM

## 2023-03-09 DIAGNOSIS — E785 Hyperlipidemia, unspecified: Secondary | ICD-10-CM

## 2023-03-09 DIAGNOSIS — F5101 Primary insomnia: Secondary | ICD-10-CM

## 2023-03-09 NOTE — Telephone Encounter (Signed)
SSZ pending labs

## 2023-03-09 NOTE — Progress Notes (Signed)
Office Visit Note  Patient: Abigail Lamb             Date of Birth: 06/06/1951           MRN: [id number]             PCP: Janith Lima, MD Referring: Janith Lima, MD Visit Date: 03/09/2023 Occupation: '@GUAROCC'$ @  Subjective:  Medication monitoring   History of Present Illness: Abigail Lamb is a 72 y.o. female with history of rheumatoid arthritis and osteoarthritis.  She is taking Plaquenil 200 mg 1 tablet by mouth twice daily.  She continues to tolerate Plaquenil without any side effects.  Patient reports that she was diagnosed with COVID-19 at the beginning of February 2024.  She states that while recovering from the infection she had most of her medications for about 3 weeks.  She has resumed Plaquenil as prescribed.  She has not yet restarted Forteo.  According to the patient she has been off of Forteo for about 1 month but she plans on resuming this week.  Patient reports that she has not yet started on sulfasalazine but is open to initiating therapy.  She remains on Chantix and has been cutting down on smoking cigarettes.  She smokes about 3-8 cigarettes per day depending on her stress level. She continues to have pain in both hands, both knees, and both ankle joints.  She notices intermittent swelling in her hands.  She uses arthritis compression gloves which provide temporary relief.  She continues to take Celebrex 50 mg twice daily for pain relief and remains on Cymbalta 90 mg daily.  She takes Tylenol at bedtime for moderate to severe pain relief.     Activities of Daily Living:  Patient reports morning stiffness for 1 hour.   Patient Reports nocturnal pain.  Difficulty dressing/grooming: Denies Difficulty climbing stairs: Reports Difficulty getting out of chair: Denies Difficulty using hands for taps, buttons, cutlery, and/or writing: Reports  Review of Systems  Constitutional:  Positive for fatigue.  HENT:  Positive for mouth dryness. Negative for mouth sores.    Eyes:  Positive for dryness.  Respiratory:  Positive for shortness of breath.   Cardiovascular:  Negative for chest pain and palpitations.  Gastrointestinal:  Negative for blood in stool, constipation and diarrhea.  Endocrine: Negative for increased urination.  Genitourinary:  Positive for involuntary urination.  Musculoskeletal:  Positive for joint pain, gait problem, joint pain, joint swelling and morning stiffness. Negative for myalgias, muscle weakness, muscle tenderness and myalgias.  Skin:  Negative for color change, rash, hair loss and sensitivity to sunlight.  Allergic/Immunologic: Negative for susceptible to infections.  Neurological:  Positive for dizziness and headaches.  Hematological:  Negative for swollen glands.  Psychiatric/Behavioral:  Positive for depressed mood and sleep disturbance. The patient is nervous/anxious.     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 ([place]) 11/08/2021   Gastroparesis 11/07/2021   Irritable bowel syndrome with both constipation and diarrhea 11/07/2021   Encounter for general adult medical examination with abnormal findings 11/07/2021   Leukocytosis 0000000   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   Routine general medical examination at a health care facility 08/02/2020   Visit for screening mammogram 08/02/2020   Rheumatoid arthritis involving multiple sites with positive rheumatoid factor ([place]) 08/02/2020   Hyperlipidemia LDL goal <130 08/02/2020  Tobacco abuse 08/02/2020   Dupuytren's contracture of right hand 08/02/2020   Pulmonary emphysema (HCC)    Stage 2 moderate COPD by GOLD classification ([place]) 01/30/2020   Neurodermatitis 03/28/2019   Primary insomnia 03/28/2019   Recurrent depression ([employer]) 03/28/2019    Past Medical History:   Diagnosis Date   Celiac disease    GERD (gastroesophageal reflux disease)    MGUS (monoclonal gammopathy of unknown significance)    per patient    Osteoporosis    per patient, diagnosed by Dr. Shona Simpson in [facility]   PUD (peptic ulcer disease)    Pulmonary emphysema ([place])    Recurrent depression ([place])     Family History  Problem Relation Age of Onset   CVA Mother    CAD Mother    Diabetes Mother    COPD Mother    Fibromyalgia Mother    Depression Mother    Hypertension Mother    Emphysema Mother        smoked   Heart attack Mother    Prostate cancer Father    Cancer - Prostate Father    Rheum arthritis Sister    Skin cancer Brother    Kidney Stones Brother    Hernia Brother    Depression Daughter    Healthy Son    Healthy Son    Past Surgical History:  Procedure Laterality Date   ABCESS DRAINAGE Left    under left arm   TONSILLECTOMY  1977   Social History   Social History Narrative   Not on file   Immunization History  Administered Date(s) Administered   Fluad Quad(high Dose 65+) 11/08/2021, 10/27/2022   Influenza, High Dose Seasonal PF 12/30/2019   Influenza-Unspecified 01/04/2018, 12/15/2018, 09/11/2020   PNEUMOCOCCAL CONJUGATE-20 11/08/2021   Pneumococcal Polysaccharide-23 08/02/2020   Tdap 11/05/2020   Zoster Recombinat (Shingrix) 03/20/2021, 11/27/2021     Objective: Vital Signs: BP 101/68 (BP Location: Left Arm, Patient Position: Sitting, Cuff Size: Normal)   Pulse (!) 103   Ht '5\' 2"'$  (1.575 m)   Wt 83 lb (37.6 kg)   BMI 15.18 kg/m    Physical Exam Vitals and nursing note reviewed.  Constitutional:      Appearance: She is well-developed.  HENT:     Head: Normocephalic and atraumatic.  Eyes:     Conjunctiva/sclera: Conjunctivae normal.  Cardiovascular:     Rate and Rhythm: Normal rate and regular rhythm.     Heart sounds: Normal heart sounds.  Pulmonary:     Effort: Pulmonary effort is normal.     Breath sounds: Normal breath sounds.   Abdominal:     General: Bowel sounds are normal.     Palpations: Abdomen is soft.  Musculoskeletal:     Cervical back: Normal range of motion.  Skin:    General: Skin is warm and dry.     Capillary Refill: Capillary refill takes less than 2 seconds.  Neurological:     Mental Status: She is alert and oriented to person, place, and time.  Psychiatric:        Behavior: Behavior normal.      Musculoskeletal Exam: C-spine has limited range of motion with lateral [place].  Thoracic kyphosis noted.  Shoulder joints and elbow joints have good range of motion.  Rheumatoid nodules noted on the extensor surface of both elbows.  Tenderness and synovitis over the right wrist joint.  Limited range of motion of both wrist.  Synovial thickening over all MCP joints.  Tenderness and synovitis  over the left second and third MCP joints.  Ulnar deviation noted most severe in the right hand.  Hip joints have good range of motion with some discomfort in the groin bilaterally.  Knee joints have good range of motion with no warmth or effusion.  Ankle joints have good range of motion with no tenderness or joint swelling.  CDAI Exam: CDAI Score: 8.8  Patient Global: 4 mm; Provider Global: 4 mm Swollen: 3 ; Tender: 6  Joint Exam 03/09/2023      Right  Left  Glenohumeral      Tender  Wrist  Swollen Tender     MCP 2     Swollen Tender  MCP 3     Swollen Tender  PIP 2   Tender     DIP 2   Tender        Investigation: No additional findings.  Imaging: No results found.  Recent Labs: Lab Results  Component Value Date   WBC 9.8 11/28/2022   HGB 13.7 11/28/2022   PLT 297 11/28/2022   NA 137 11/28/2022   K 4.0 11/28/2022   CL 101 11/28/2022   CO2 26 11/28/2022   GLUCOSE 75 11/28/2022   BUN 18 11/28/2022   CREATININE 0.61 11/28/2022   BILITOT 0.4 11/28/2022   ALKPHOS 152 (H) 11/28/2022   AST 22 11/28/2022   ALT 13 11/28/2022   PROT 8.3 (H) 11/28/2022   ALBUMIN 4.1 11/28/2022   CALCIUM 10.5 (H)  11/28/2022   GFRAA 107 10/26/2020   QFTBGOLDPLUS NEGATIVE 09/17/2020    Speciality Comments: PLQ Eye Exam: 08/14/2022 WNL @ [facility] Follow up 1 year.   Humira and Orencia were discontinued in the past due to frequent infections per patient.  Forteo started 05/29/22  Procedures:  No procedures performed Allergies: Dust mite extract   Assessment / Plan:     Visit Diagnoses: Rheumatoid arthritis with rheumatoid factor of multiple sites without organ or systems involvement [facility]): Patient has tenderness and synovitis over the left second and third MCP joints and over the right wrist joint.  She continues to have intermittent discomfort in both hands, both knees, both ankle joints.  She is prescribed Plaquenil 200 mg 1 tablet by mouth twice daily.  She continues to tolerate Plaquenil without any side effects.  The plan after her last office visit was to initiate sulfasalazine as combination therapy.  At that time she was apprehensive to add any medications since she had recently been started on Chantix.  She remains on Chantix and has been able to reduce her cigarette use to 3 to 8 cigarettes/day.  She is open to initiating sulfasalazine at this time.  Indications, contraindications, and potential side effects of sulfasalazine reviewed today in detail.  All questions were addressed and consent was obtained.  CBC, CMP, G6PD will be checked today.  A prescription for sulfasalazine will be sent to the pharmacy pending lab results.  She will remain on sulfasalazine as combination therapy.  She will notify us if she cannot tolerate taking sulfasalazine.  She will follow-up in the office in 8 weeks to assess her response.  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   High risk medication use -Plaquenil 200 mg 1 tablet by mouth twice daily and sulfasalazine 500 mg 2 tablets twice daily.  No longer taking prednisone.  Remains on Celebrex 50 mg 1 capsule twice daily  prescribed by Dr. Ronnald Ramp. Frequent infections while on Humira and Orencia. CBC and  CMP updated on 11/28/22. Standing orders for CBC and CMP placed today.  Her next lab work will be due in 1 month then every 3 months.   PLQ Eye Exam: 08/14/2022 WNL @ [facility] Follow up 1 year. Diagnosed with COVID-19 in February 2024.  Patient held Plaquenil for about 3 weeks.  Plan: CBC with Differential/Platelet, COMPLETE METABOLIC PANEL WITH GFR, Glucose 6 phosphate dehydrogenase, CBC with Differential/Platelet, COMPLETE METABOLIC PANEL WITH GFR  Primary osteoarthritis of both knees: She experiences intermittent discomfort in both knee joints.  No warmth or effusion noted on examination today.  Monoclonal gammopathy:  IgM kappa monoclonal gammopathy.  She had recent significant weight loss.  She is followed by Dr. Lorenso Courier.     Age-related osteoporosis without current pathological fracture:  - Forteo injections started 05/29/22. DEXA updated on 02/07/2022: LFN BMD 0.440 with T score -3.7.  Due to update DEXA in February 2025. Forteo daily injections were started 05/29/22.  Patient had a gap in [place] use in February 2024 but plans on resuming Forteo daily injections as prescribed.  She continues to take vitamin D 5000 units daily.  Vitamin D deficiency: She is taking vitamin D 5000 units daily.  Other medical conditions are listed as follows:  Pain in left hip  Stage 2 moderate COPD by GOLD classification ([place])  Prediabetes  Lung nodules  Kidney stone  Tobacco abuse  Recurrent depression ([place])  Panlobular emphysema (HCC)  Hyperlipidemia LDL goal <130  Primary insomnia  Orders: Orders Placed This Encounter  Procedures   CBC with Differential/Platelet   COMPLETE METABOLIC PANEL WITH GFR   Glucose 6 phosphate dehydrogenase   CBC with Differential/Platelet   COMPLETE METABOLIC PANEL WITH GFR   No orders of the defined types were placed in this encounter.    Follow-Up Instructions:  Return in about 8 weeks (around 05/04/2023) for Rheumatoid arthritis, Osteoarthritis.   Ofilia Neas, PA-C  Note - This record has been created using Dragon software.  Chart creation errors have been sought, but may not always  have been located. Such creation errors do not reflect on  the standard of medical care.

## 2023-03-09 NOTE — Patient Instructions (Signed)
Standing Labs We placed an order today for your standing lab work.   Please have your standing labs drawn in 1 month then every 3 months  Please have your labs drawn 2 weeks prior to your appointment so that the provider can discuss your lab results at your appointment, if possible.  Please note that you may see your imaging and lab results in [place] before we have reviewed them. We will contact you once all results are reviewed. Please allow our office up to 72 hours to thoroughly review all of the results before contacting the office for clarification of your results.  WALK-IN LAB HOURS  Monday through Thursday from 8:00 am -12:30 pm and 1:00 pm-5:00 pm and Friday from 8:00 am-12:00 pm.  Patients with office visits requiring labs will be seen before walk-in labs.  You may encounter longer than normal wait times. Please allow additional time. Wait times may be shorter on  Monday and Thursday afternoons.  We do not book appointments for walk-in labs. We appreciate your patience and understanding with our staff.   Labs are drawn by [facility]. Please bring your co-pay at the time of your lab draw.  You may receive a bill from [facility] for your lab work.  Please note if you are on Hydroxychloroquine and and an order has been placed for a Hydroxychloroquine level,  you will need to have it drawn 4 hours or more after your last dose.  If you wish to have your labs drawn at another location, please call the office 24 hours in advance so we can fax the orders.  The office is located at [address] [place], [place], [place] [address]   If you have any questions regarding directions or hours of operation,  please call [phone].   As a reminder, please drink plenty of water prior to coming for your lab work. Thanks!    Sulfasalazine Tablets What is this medication? SULFASALAZINE (sul fa SAL a zeen) treats ulcerative colitis. It works by decreasing inflammation. It belongs to a group of  medications called salicylates. This medicine may be used for other purposes; ask your health care provider or pharmacist if you have questions. COMMON BRAND NAME(S): Azulfidine, Sulfazine What should I tell my care team before I take this medication? They need to know if you have any of these conditions: Asthma Blood disorders or anemia Glucose-6-phosphate dehydrogenase (G6PD) deficiency Intestinal obstruction Kidney disease Liver disease Porphyria Urinary tract obstruction An unusual reaction to sulfasalazine, sulfa medications, salicylates, or other medications, foods, dyes, or preservatives Pregnant or trying to get pregnant Breast-feeding How should I use this medication? Take this medication by mouth with a full glass of water. Take it as directed on the prescription label at the same time every day. You can take it with or without food. If it upsets your stomach, take it with food. Keep taking it unless your care team tells you to stop. Talk to your care team about the use of this medication in children. While this medication may be prescribed for children as young as 6 years for selected conditions, precautions do apply. Patients over 82 years old may have a stronger reaction and need a smaller dose. Overdosage: If you think you have taken too much of this medicine contact a poison control center or emergency room at once. NOTE: This medicine is only for you. Do not share this medicine with others. What if I miss a dose? If you miss a dose, take it as  soon as you can. If it is almost time for your next dose, take only that dose. Do not take double or extra doses. What may interact with this medication? Digoxin Folic acid This list may not describe all possible interactions. Give your health care provider a list of all the medicines, herbs, non-prescription drugs, or dietary supplements you use. Also tell them if you smoke, drink alcohol, or use illegal drugs. Some items may  interact with your medicine. What should I watch for while using this medication? Visit your care team for regular checks on your progress. Tell your care team if your symptoms do not start to get better or if they get worse. You will need frequent blood and urine checks. This medication can make you more sensitive to the sun. Keep out of the sun. If you cannot avoid being in the sun, wear protective clothing and use sunscreen. Do not use sun lamps or tanning beds/booths. Drink plenty of water while taking this medication. What side effects may I notice from receiving this medication? Side effects that you should report to your care team as soon as possible: Allergic reactions--skin rash, itching, hives, swelling of the face, lips, tongue, or throat Aplastic anemia--unusual weakness or fatigue, dizziness, headache, trouble breathing, increased bleeding or bruising Dry cough, shortness of breath or trouble breathing Heart muscle inflammation--unusual weakness or fatigue, shortness of breath, chest pain, fast or irregular heartbeat, dizziness, swelling of the ankles, feet, or hands Infection--fever, chills, cough, sore throat, wounds that don't heal, pain or trouble when passing urine, general feeling of discomfort or being unwell Kidney injury--decrease in the amount of urine, swelling of the ankles, hands, or feet Liver injury--right upper belly pain, loss of appetite, nausea, light-colored stool, dark yellow or brown urine, yellowing skin or eyes, unusual weakness or fatigue Rash, fever, and swollen lymph nodes Redness, blistering, peeling, or loosening of the skin, including inside the mouth Side effects that usually do not require medical attention (report to your care team if they continue or are bothersome): Dark yellow or orange saliva, sweat, or urine Dizziness Headache Loss of appetite Nausea Upset stomach Vomiting This list may not describe all possible side effects. Call your  doctor for medical advice about side effects. You may report side effects to FDA at 1-800-FDA-1088. Where should I keep my medication? Keep out of the reach of children and pets. Store at room temperature between 15 and 30 degrees C (59 and 86 degrees F). Get rid of any unused medication after the expiration date. To get rid of medications that are no longer needed or have expired: Take the medications to a medication take-back program. Check with your pharmacy or law enforcement to find a location. If you cannot return the medication, check the label or package insert to see if the medication should be thrown out in the garbage or flushed down the toilet. If you are not sure, ask your care team. If it is safe to put it in the trash, take the medication out of the container. Mix the medication with cat litter, dirt, coffee grounds, or other unwanted substance. Seal the mixture in a bag or container. Put it in the trash. NOTE: This sheet is a summary. It may not cover all possible information. If you have questions about this medicine, talk to your doctor, pharmacist, or health care provider.  2023 Elsevier/Gold Standard (2021-09-23 00:00:00)

## 2023-03-10 NOTE — Progress Notes (Signed)
WBC count is elevated. Absolute neurophils are monocytes are elevated. Rest of CBC WNL. Patient recently had covid-19 and was on a prednisone taper.  Alk phos is slightly elevated. Rest of CMP WNL. We will continue to monitor.  She will be having updated lab work in 1  month after starting on sulfasalazine.

## 2023-03-13 LAB — CBC WITH DIFFERENTIAL/PLATELET
Absolute Monocytes: 1157 cells/uL — ABNORMAL HIGH (ref 200–950)
Basophils Absolute: 120 cells/uL (ref 0–200)
Basophils Relative: 0.9 %
Eosinophils Absolute: 333 cells/uL (ref 15–500)
Eosinophils Relative: 2.5 %
HCT: 39.1 % (ref 35.0–45.0)
Hemoglobin: 13.3 g/dL (ref 11.7–15.5)
Lymphs Abs: 2753 cells/uL (ref 850–3900)
MCH: 30 pg (ref 27.0–33.0)
MCHC: 34 g/dL (ref 32.0–36.0)
MCV: 88.3 fL (ref 80.0–100.0)
MPV: 12.1 fL (ref 7.5–12.5)
Monocytes Relative: 8.7 %
Neutro Abs: 8938 cells/uL — ABNORMAL HIGH (ref 1500–7800)
Neutrophils Relative %: 67.2 %
Platelets: 232 10*3/uL (ref 140–400)
RBC: 4.43 10*6/uL (ref 3.80–5.10)
RDW: 13.1 % (ref 11.0–15.0)
Total Lymphocyte: 20.7 %
WBC: 13.3 10*3/uL — ABNORMAL HIGH (ref 3.8–10.8)

## 2023-03-13 LAB — COMPLETE METABOLIC PANEL WITH GFR
AG Ratio: 1.1 (calc) (ref 1.0–2.5)
ALT: 11 U/L (ref 6–29)
AST: 21 U/L (ref 10–35)
Albumin: 3.8 g/dL (ref 3.6–5.1)
Alkaline phosphatase (APISO): 157 U/L — ABNORMAL HIGH (ref 37–153)
BUN: 20 mg/dL (ref 7–25)
CO2: 31 mmol/L (ref 20–32)
Calcium: 9.7 mg/dL (ref 8.6–10.4)
Chloride: 103 mmol/L (ref 98–110)
Creat: 0.61 mg/dL (ref 0.60–1.00)
Globulin: 3.5 g/dL (calc) (ref 1.9–3.7)
Glucose, Bld: 66 mg/dL (ref 65–99)
Potassium: 4.2 mmol/L (ref 3.5–5.3)
Sodium: 141 mmol/L (ref 135–146)
Total Bilirubin: 0.4 mg/dL (ref 0.2–1.2)
Total Protein: 7.3 g/dL (ref 6.1–8.1)
eGFR: 96 mL/min/{1.73_m2} (ref >=60)

## 2023-03-13 LAB — GLUCOSE 6 PHOSPHATE DEHYDROGENASE: G-6PDH: 21 U/g Hgb — ABNORMAL HIGH (ref 7.0–20.5)

## 2023-03-16 ENCOUNTER — Telehealth: Payer: Self-pay

## 2023-03-16 MED ORDER — SULFASALAZINE 500 MG PO TABS: 1000.0000 mg | ORAL_TABLET | Freq: Two times a day (BID) | ORAL | 2 refills | Status: DC

## 2023-03-16 NOTE — Progress Notes (Signed)
G6PDH is slightly elevated. Ok to initiate sulfasalazine.

## 2023-03-16 NOTE — Addendum Note (Signed)
Addended by: Carole Binning on: 03/16/2023 08:35 AM   Modules accepted: Orders

## 2023-03-16 NOTE — Telephone Encounter (Signed)
G6PDH is slightly elevated. Ok to initiate sulfasalazine.   Per office note on 03/09/2023: sulfasalazine 500 mg 2 tablets twice daily.

## 2023-03-16 NOTE — Telephone Encounter (Signed)
Contacted Disiree Salter to schedule their annual wellness visit. Appointment made for 03/19/23.  Norton Blizzard, [place] ([facility])  [facility] [phone]

## 2023-03-19 ENCOUNTER — Ambulatory Visit (INDEPENDENT_AMBULATORY_CARE_PROVIDER_SITE_OTHER): Payer: Medicare Other

## 2023-03-19 VITALS — Ht 62.0 in | Wt 83.0 lb

## 2023-03-19 DIAGNOSIS — Z0000 Encounter for general adult medical examination without abnormal findings: Secondary | ICD-10-CM | POA: Diagnosis not present

## 2023-03-19 NOTE — Patient Instructions (Signed)
[name] , Thank you for taking time to come for your Medicare Wellness Visit. I appreciate your ongoing commitment to your health goals. Please review the following plan we discussed and let me know if I can assist you in the future.   These are the goals we discussed:  Goals       Quit Smoking (pt-stated)      I will try to ween myself off cigarettes.        This is a list of the screening recommended for you and due dates:  Health Maintenance  Topic Date Due   COVID-19 Vaccine (1) Never done   Screening for Lung Cancer  07/03/2023   Mammogram  02/08/2024   Medicare Annual Wellness Visit  03/18/2024   Colon Cancer Screening  07/31/2025   DTaP/Tdap/Td vaccine (2 - Td or Tdap) 11/05/2030   Pneumonia Vaccine  Completed   Flu Shot  Completed   DEXA scan (bone density measurement)  Completed   Hepatitis C Screening: USPSTF Recommendation to screen - Ages 35-79 yo.  Completed   Zoster (Shingles) Vaccine  Completed   HPV Vaccine  Aged Out    Advanced directives: No  Conditions/risks identified: Yes  Next appointment: Follow up in one year for your annual wellness visit.   Preventive Care 71 Years and Older, Female Preventive care refers to lifestyle choices and visits with your health care provider that can promote health and wellness. What does preventive care include? A yearly physical exam. This is also called an annual well check. Dental exams once or twice a year. Routine eye exams. Ask your health care provider how often you should have your eyes checked. Personal lifestyle choices, including: Daily care of your teeth and gums. Regular physical activity. Eating a healthy diet. Avoiding tobacco and drug use. Limiting alcohol use. Practicing safe sex. Taking low-dose aspirin every day. Taking vitamin and mineral supplements as recommended by your health care provider. What happens during an annual well check? The services and screenings done by your health care  provider during your annual well check will depend on your age, overall health, lifestyle risk factors, and family history of disease. Counseling  Your health care provider may ask you questions about your: Alcohol use. Tobacco use. Drug use. Emotional well-being. Home and relationship well-being. Sexual activity. Eating habits. History of falls. Memory and ability to understand (cognition). Work and work Statistician. Reproductive health. Screening  You may have the following tests or measurements: Height, weight, and BMI. Blood pressure. Lipid and cholesterol levels. These may be checked every 5 years, or more frequently if you are over 62 years old. Skin check. Lung cancer screening. You may have this screening every year starting at age 63 if you have a 30-pack-year history of smoking and currently smoke or have quit within the past 15 years. Fecal occult blood test (FOBT) of the stool. You may have this test every year starting at age 27. Flexible sigmoidoscopy or colonoscopy. You may have a sigmoidoscopy every 5 years or a colonoscopy every 10 years starting at age 7. Hepatitis C blood test. Hepatitis B blood test. Sexually transmitted disease (STD) testing. Diabetes screening. This is done by checking your blood sugar (glucose) after you have not eaten for a while (fasting). You may have this done every 1-3 years. Bone density scan. This is done to screen for osteoporosis. You may have this done starting at age 75. Mammogram. This may be done every 1-2 years. Talk to your health  care provider about how often you should have regular mammograms. Talk with your health care provider about your test results, treatment options, and if necessary, the need for more tests. Vaccines  Your health care provider may recommend certain vaccines, such as: Influenza vaccine. This is recommended every year. Tetanus, diphtheria, and acellular pertussis (Tdap, Td) vaccine. You may need a Td  booster every 10 years. Zoster vaccine. You may need this after age 9. Pneumococcal 13-valent conjugate (PCV13) vaccine. One dose is recommended after age 35. Pneumococcal polysaccharide (PPSV23) vaccine. One dose is recommended after age 80. Talk to your health care provider about which screenings and vaccines you need and how often you need them. This information is not intended to replace advice given to you by your health care provider. Make sure you discuss any questions you have with your health care provider. Document Released: 01/11/2016 Document Revised: 09/03/2016 Document Reviewed: 10/16/2015 Elsevier Interactive Patient Education  2017 [place] Prevention in the Home Falls can cause injuries. They can happen to people of all ages. There are many things you can do to make your home safe and to help prevent falls. What can I do on the outside of my home? Regularly fix the edges of walkways and driveways and fix any cracks. Remove anything that might make you trip as you walk through a door, such as a raised step or threshold. Trim any bushes or trees on the path to your home. Use bright outdoor lighting. Clear any walking paths of anything that might make someone trip, such as rocks or tools. Regularly check to see if handrails are loose or broken. Make sure that both sides of any steps have handrails. Any raised decks and porches should have guardrails on the edges. Have any leaves, snow, or ice cleared regularly. Use sand or salt on walking paths during winter. Clean up any spills in your garage right away. This includes oil or grease spills. What can I do in the bathroom? Use night lights. Install grab bars by the toilet and in the tub and shower. Do not use towel bars as grab bars. Use non-skid mats or decals in the tub or shower. If you need to sit down in the shower, use a plastic, non-slip stool. Keep the floor dry. Clean up any water that spills on the floor  as soon as it happens. Remove soap buildup in the tub or shower regularly. Attach bath mats securely with double-sided non-slip rug tape. Do not have throw rugs and other things on the floor that can make you trip. What can I do in the bedroom? Use night lights. Make sure that you have a light by your bed that is easy to reach. Do not use any sheets or blankets that are too big for your bed. They should not hang down onto the floor. Have a firm chair that has side arms. You can use this for support while you get dressed. Do not have throw rugs and other things on the floor that can make you trip. What can I do in the kitchen? Clean up any spills right away. Avoid walking on wet floors. Keep items that you use a lot in easy-to-reach places. If you need to reach something above you, use a strong step stool that has a grab bar. Keep electrical cords out of the way. Do not use floor polish or wax that makes floors slippery. If you must use wax, use non-skid floor wax. Do not have throw  rugs and other things on the floor that can make you trip. What can I do with my stairs? Do not leave any items on the stairs. Make sure that there are handrails on both sides of the stairs and use them. Fix handrails that are broken or loose. Make sure that handrails are as long as the stairways. Check any carpeting to make sure that it is firmly attached to the stairs. Fix any carpet that is loose or worn. Avoid having throw rugs at the top or bottom of the stairs. If you do have throw rugs, attach them to the floor with carpet tape. Make sure that you have a light switch at the top of the stairs and the bottom of the stairs. If you do not have them, ask someone to add them for you. What else can I do to help prevent falls? Wear shoes that: Do not have high heels. Have rubber bottoms. Are comfortable and fit you well. Are closed at the toe. Do not wear sandals. If you use a stepladder: Make sure that it is  fully opened. Do not climb a closed stepladder. Make sure that both sides of the stepladder are locked into place. Ask someone to hold it for you, if possible. Clearly mark and make sure that you can see: Any grab bars or handrails. First and last steps. Where the edge of each step is. Use tools that help you move around (mobility aids) if they are needed. These include: Canes. Walkers. Scooters. Crutches. Turn on the lights when you go into a dark area. Replace any light bulbs as soon as they burn out. Set up your furniture so you have a clear path. Avoid moving your furniture around. If any of your floors are uneven, fix them. If there are any pets around you, be aware of where they are. Review your medicines with your doctor. Some medicines can make you feel dizzy. This can increase your chance of falling. Ask your doctor what other things that you can do to help prevent falls. This information is not intended to replace advice given to you by your health care provider. Make sure you discuss any questions you have with your health care provider. Document Released: 10/11/2009 Document Revised: 05/22/2016 Document Reviewed: 01/19/2015 Elsevier Interactive Patient Education  2017 [employer].

## 2023-03-19 NOTE — Progress Notes (Addendum)
I connected with  Abigail Lamb on 03/19/23 by a audio enabled telemedicine application and verified that I am speaking with the correct person using two identifiers.  Patient Location: Home  Provider Location: Office/Clinic  I discussed the limitations of evaluation and management by telemedicine. The patient expressed understanding and agreed to proceed.  Patient Medicare AWV questionnaire was completed by the patient on 03/18/2023; I have confirmed that all information answered by patient is correct and no changes since this date.     Subjective:   Abigail Lamb is a 72 y.o. female who presents for Medicare Annual (Subsequent) preventive examination.  Review of Systems     Cardiac Risk Factors include: advanced age (>27men, >32 women);family history of premature cardiovascular disease;smoking/ tobacco exposure     Objective:    Today's Vitals   03/19/23 1047 03/19/23 1053  Weight: 83 lb (37.6 kg)   Height: 5\' 2"  (1.575 m)   PainSc: 6  6   PainLoc: Hand    Body mass index is 15.18 kg/m.     03/19/2023   11:12 AM 03/11/2022    2:33 PM 11/09/2020   10:38 AM 11/05/2020   11:15 AM 10/24/2020    8:03 AM 10/24/2020    7:41 AM  Advanced Directives  Does Patient Have a Medical Advance Directive? No No No No No No  Would patient like information on creating a medical advance directive? No - Patient declined Yes (MAU/Ambulatory/Procedural Areas - Information given) No - Patient declined No - Patient declined No - Patient declined No - Patient declined    Current Medications (verified) Outpatient Encounter Medications as of 03/19/2023  Medication Sig   albuterol (VENTOLIN HFA) 108 (90 Base) MCG/ACT inhaler Take 1-2 puffs every 4-6 hrs prn shortness of breath. Needs ventolin.   celecoxib (CELEBREX) 50 MG capsule TAKE 1 CAPSULE BY MOUTH 2 TIMES DAILY.   Cholecalciferol (VITAMIN D3) 125 MCG (5000 UT) CAPS Take 5,000 Units by mouth daily.   clindamycin (CLEOCIN T) 1 % external  solution as needed.    clonazePAM (KLONOPIN) 0.5 MG tablet Take 1 tablet (0.5 mg total) by mouth 2 (two) times daily as needed for anxiety.   co-enzyme Q-10 30 MG capsule Take by mouth.   colesevelam (WELCHOL) 625 MG tablet TAKE 3 TABLETS BY MOUTH TWICE A DAY WITH MEAL   dicyclomine (BENTYL) 10 MG capsule TAKE 1 CAPSULE (10 MG TOTAL) BY MOUTH 4 TIMES A DAY BEFORE MEALS AND AT BEDTIME   dronabinol (MARINOL) 2.5 MG capsule TAKE 1 CAPSULE BY MOUTH 2 TIMES DAILY BEFORE LUNCH AND SUPPER (Patient not taking: Reported on 03/09/2023)   DULoxetine (CYMBALTA) 30 MG capsule Take 1 capsule (30 mg total) by mouth daily. For total of 90 mg daily   DULoxetine (CYMBALTA) 60 MG capsule TAKE 1 CAPSULE(60 MG) BY MOUTH DAILY   famotidine (PEPCID) 40 MG tablet TAKE 1 TABLET BY MOUTH TWICE A DAY   fluocinonide-emollient (LIDEX-E) 0.05 % cream Apply 1 application topically 2 (two) times daily.   fluticasone (FLONASE) 50 MCG/ACT nasal spray Place 1 spray into both nostrils daily.   folic acid (FOLVITE) 1 MG tablet Take by mouth.   FORTEO 600 MCG/2.4ML SOPN INJECT 20MCG UNDER THE SKIN ONCE DAILY. DISCARD DEVICE 28 DAYS AFTER INITIAL USE.   hydroxychloroquine (PLAQUENIL) 200 MG tablet TAKE 1 TABLET BY MOUTH TWICE A DAY   hydrOXYzine (ATARAX) 10 MG tablet TAKE 1 TABLET BY MOUTH EVERY 8 HOURS AS NEEDED   Insulin Pen Needle 31G X 5  MM MISC Use to inject Forteo once daily. DO NOT REUSE NEEDLE.   loratadine (CLARITIN) 10 MG tablet Take 10 mg by mouth daily.   ondansetron (ZOFRAN) 4 MG tablet TAKE 1 TABLET BY MOUTH EVERY 8 HOURS AS NEEDED FOR NAUSEA AND VOMITING   sulfaSALAzine (AZULFIDINE) 500 MG tablet Take 2 tablets (1,000 mg total) by mouth 2 (two) times daily.   TUBERCULIN SYR 1CC/25GX5/8" 25G X 5/8" 1 ML MISC Use 1 syringe weekly for Methotrexate injection. (Patient not taking: Reported on 03/09/2023)   umeclidinium-vilanterol (ANORO ELLIPTA) 62.5-25 MCG/ACT AEPB Inhale 1 puff into the lungs daily.   varenicline (CHANTIX)  1 MG tablet TAKE 1 TABLET BY MOUTH TWICE A DAY   No facility-administered encounter medications on file as of 03/19/2023.    Allergies (verified) Dust mite extract   History: Past Medical History:  Diagnosis Date   Celiac disease    GERD (gastroesophageal reflux disease)    MGUS (monoclonal gammopathy of unknown significance)    per patient    Osteoporosis    per patient, diagnosed by Dr. Shona Simpson in [facility]   PUD (peptic ulcer disease)    Pulmonary emphysema ([place])    Recurrent depression ([place])    Past Surgical History:  Procedure Laterality Date   ABCESS DRAINAGE Left    under left arm   TONSILLECTOMY  1977   Family History  Problem Relation Age of Onset   CVA Mother    CAD Mother    Diabetes Mother    COPD Mother    Fibromyalgia Mother    Depression Mother    Hypertension Mother    Emphysema Mother        smoked   Heart attack Mother    Prostate cancer Father    Cancer - Prostate Father    Rheum arthritis Sister    Skin cancer Brother    Kidney Stones Brother    Hernia Brother    Depression Daughter    Healthy Son    Healthy Son    Social History   Socioeconomic History   Marital status: Divorced    Spouse name: Not on file   Number of children: Not on file   Years of education: Not on file   Highest education level: Not on file  Occupational History   Not on file  Tobacco Use   Smoking status: Every Day    Packs/day: 0.50    Years: 49.00    Additional pack years: 0.00    Total pack years: 24.50    Types: Cigarettes    Passive exposure: Current   Smokeless tobacco: Never   Tobacco comments:    Currently 3 cigarettes a day.  Amy marsh, cma 12/11/2022  Vaping Use   Vaping Use: Never used  Substance and Sexual Activity   Alcohol use: Not Currently   Drug use: Never   Sexual activity: Not Currently    Partners: Male  Other Topics Concern   Not on file  Social History Narrative   Not on file   Social Determinants of Health   Financial  Resource Strain: Patient Declined (03/19/2023)   Overall Financial Resource Strain (CARDIA)    Difficulty of Paying Living Expenses: Patient declined  Food Insecurity: Patient Declined (03/19/2023)   Hunger Vital Sign    Worried About Running Out of Food in the Last Year: Patient declined    [place] in the Last Year: Patient declined  Transportation Needs: Patient Declined (03/19/2023)   [place] -  Hydrologist (Medical): Patient declined    Lack of Transportation (Non-Medical): Patient declined  Physical Activity: Patient Declined (03/19/2023)   Exercise Vital Sign    Days of Exercise per Week: Patient declined    Minutes of Exercise per Session: Patient declined  Stress: Stress Concern Present (03/19/2023)   [place]    Feeling of Stress : Rather much  Social Connections: Unknown (03/19/2023)   Social Connection and Isolation Panel [NHANES]    Frequency of Communication with Friends and Family: More than three times a week    Frequency of Social Gatherings with Friends and Family: More than three times a week    Attends Religious Services: Not on file    Active Member of Clubs or Organizations: No    Attends Archivist Meetings: Patient declined    Marital Status: Widowed    Tobacco Counseling Ready to quit: Not Answered Counseling given: Not Answered Tobacco comments: Currently 3 cigarettes a day.  Amy marsh, cma 12/11/2022   Clinical Intake:  Pre-visit preparation completed: Yes  Pain : 0-10 Pain Score: 6  Pain Type: Chronic pain Pain Location: Hand Pain Orientation: Right     BMI - recorded: 15.18 Nutritional Status: BMI <19  Underweight Nutritional Risks: None Diabetes: No  How often do you need to have someone help you when you read instructions, pamphlets, or other written materials from your doctor or pharmacy?: 1 - Never What is the last grade level  you completed in school?: HSG  Diabetic? NO  Interpreter Needed?: No  Information entered by :: Lisette Abu, LPN.   Activities of Daily Living    03/19/2023   11:04 AM 03/18/2023   11:12 PM  In your present state of health, do you have any difficulty performing the following activities:  Hearing? 1 1  Vision? 0 0  Difficulty concentrating or making decisions? 0 0  Walking or climbing stairs? 1 1  Dressing or bathing? 0 0  Doing errands, shopping? 0 0  Preparing Food and eating ? N N  Using the Toilet? N N  In the past six months, have you accidently leaked urine? Y Y  Do you have problems with loss of bowel control? Y Y  Managing your Medications? N N  Managing your Finances? N N  Housekeeping or managing your Housekeeping? Tempie Donning    Patient Care Team: Janith Lima, MD as PCP - General (Internal Medicine)  Indicate any recent Medical Services you may have received from other than [facility] providers in the past year (date may be approximate).     Assessment:   This is a routine wellness examination for Denzil.  Hearing/Vision screen Hearing Screening - Comments:: Patient denies any hearing difficulties. Vision Screening - Comments:: Wears rx glasses - up to date with routine eye exams with [facility]   Dietary issues and exercise activities discussed: Current Exercise Habits: The patient does not participate in regular exercise at present, Exercise limited by: respiratory conditions(s)   Goals Addressed   None   Depression Screen    03/19/2023   11:09 AM 02/09/2023   11:09 AM 10/27/2022   11:37 AM 03/11/2022    2:35 PM 03/11/2022    1:20 PM 11/05/2020   11:16 AM 11/05/2020   10:18 AM  PHQ 2/9 Scores  PHQ - 2 Score 2 2 0 2 2 0 0  PHQ- 9 Score 7 7 0 2 2  0 0    Fall Risk    03/19/2023   10:55 AM 03/18/2023   11:12 PM 02/09/2023   11:08 AM 03/11/2022    2:35 PM 03/11/2022    1:20 PM  Fall Risk   Falls in the past year? 0 0 0 1 1  Number falls in past  yr: 0 0 0 1 1  Injury with Fall? 0  0 1 1  Risk for fall due to : No Fall Risks  No Fall Risks Impaired mobility   Follow up Falls prevention discussed  Falls evaluation completed Falls evaluation completed     [place]:  Any stairs in or around the home? Yes  If so, are there any without handrails? No  Home free of loose throw rugs in walkways, pet beds, electrical cords, etc? Yes  Adequate lighting in your home to reduce risk of falls? Yes   ASSISTIVE DEVICES UTILIZED TO PREVENT FALLS:  Life alert? No  Use of a cane, walker or w/c? No  Grab bars in the bathroom? No  Shower chair or bench in shower? No  Elevated toilet seat or a handicapped toilet? No   TIMED UP AND GO:  Was the test performed? No . Telephonic Visit   Cognitive Function:        03/19/2023   11:02 AM 11/05/2020   11:18 AM  6CIT Screen  What Year? 0 points 0 points  What month? 0 points 0 points  What time? 0 points 0 points  Count back from 20 0 points 0 points  Months in reverse 0 points 0 points  Repeat phrase 0 points 0 points  Total Score 0 points 0 points    Immunizations Immunization History  Administered Date(s) Administered   Fluad Quad(high Dose 65+) 11/08/2021, 10/27/2022   Influenza, High Dose Seasonal PF 12/30/2019   Influenza-Unspecified 01/04/2018, 12/15/2018, 09/11/2020   PNEUMOCOCCAL CONJUGATE-20 11/08/2021   Pneumococcal Polysaccharide-23 08/02/2020   Tdap 11/05/2020   Zoster Recombinat (Shingrix) 03/20/2021, 11/27/2021    TDAP status: Up to date  Flu Vaccine status: Up to date  Pneumococcal vaccine status: Up to date  Covid-19 vaccine status: Declined, Education has been provided regarding the importance of this vaccine but patient still declined. Advised may receive this vaccine at local pharmacy or Health Dept.or vaccine clinic. Aware to provide a copy of the vaccination record if obtained from local pharmacy or Health Dept. Verbalized  acceptance and understanding.  Qualifies for Shingles Vaccine? Yes   Zostavax completed No   Shingrix Completed?: No.    Education has been provided regarding the importance of this vaccine. Patient has been advised to call insurance company to determine out of pocket expense if they have not yet received this vaccine. Advised may also receive vaccine at local pharmacy or Health Dept. Verbalized acceptance and understanding.  Screening Tests Health Maintenance  Topic Date Due   COVID-19 Vaccine (1) Never done   Lung Cancer Screening  07/03/2023   MAMMOGRAM  02/08/2024   Medicare Annual Wellness (AWV)  03/18/2024   COLONOSCOPY (Pts 45-64yrs Insurance coverage will need to be confirmed)  07/31/2025   DTaP/Tdap/Td (2 - Td or Tdap) 11/05/2030   Pneumonia Vaccine 9+ Years old  Completed   INFLUENZA VACCINE  Completed   DEXA SCAN  Completed   Hepatitis C Screening  Completed   Zoster Vaccines- Shingrix  Completed   HPV VACCINES  Aged Out    Health Maintenance  Health Maintenance Due  Topic Date Due   COVID-19 Vaccine (1) Never done    Colorectal cancer screening: Type of screening: Colonoscopy. Completed 08/01/2015. Repeat every 10 years  Mammogram status: Completed 02/07/2022. Repeat every year  Bone Density status: Completed 03/11/2022. Results reflect: Bone density results: OSTEOPOROSIS. Repeat every 2 years.  Lung Cancer Screening: (Low Dose CT Chest recommended if Age 57-80 years, 30 pack-year currently smoking OR have quit w/in 15years.) does qualify.   Lung Cancer Screening Referral: ordered 08/19/2022  Additional Screening:  Hepatitis C Screening: does qualify; Completed 09/17/2020  Vision Screening: Recommended annual ophthalmology exams for early detection of glaucoma and other disorders of the eye. Is the patient up to date with their annual eye exam?  Yes  Who is the provider or what is the name of the office in which the patient attends annual eye exams? [facility] If pt is not established with a provider, would they like to be referred to a provider to establish care? No .   Dental Screening: Recommended annual dental exams for proper oral hygiene  Community Resource Referral / Chronic Care Management: CRR required this visit?  No   CCM required this visit?  No      Plan:     I have personally reviewed and noted the following in the patient's chart:   Medical and social history Use of alcohol, tobacco or illicit drugs  Current medications and supplements including opioid prescriptions. Patient is not currently taking opioid prescriptions. Functional ability and status Nutritional status Physical activity Advanced directives List of other physicians Hospitalizations, surgeries, and ER visits in previous 12 months Vitals Screenings to include cognitive, depression, and falls Referrals and appointments  In addition, I have reviewed and discussed with patient certain preventive protocols, quality metrics, and best practice recommendations. A written personalized care plan for preventive services as well as general preventive health recommendations were provided to patient.     Sheral Flow, LPN   [id number]   Nurse Notes:  Normal cognitive status assessed by direct observation by this Nurse Health Advisor. No abnormalities found.   Patient Medicare AWV questionnaire was completed by the patient on 03/18/2023; I have confirmed that all information answered by patient is correct and no changes since this date.

## 2023-03-20 ENCOUNTER — Other Ambulatory Visit: Payer: Self-pay | Admitting: Internal Medicine

## 2023-03-20 DIAGNOSIS — K582 Mixed irritable bowel syndrome: Secondary | ICD-10-CM

## 2023-03-21 ENCOUNTER — Other Ambulatory Visit: Payer: Self-pay | Admitting: Internal Medicine

## 2023-03-21 DIAGNOSIS — E785 Hyperlipidemia, unspecified: Secondary | ICD-10-CM

## 2023-04-01 ENCOUNTER — Other Ambulatory Visit: Payer: Self-pay | Admitting: Rheumatology

## 2023-04-01 DIAGNOSIS — M810 Age-related osteoporosis without current pathological fracture: Secondary | ICD-10-CM

## 2023-04-01 NOTE — Telephone Encounter (Signed)
Last Fill: 11/24/2022  Labs: 03/09/2023 WBC count is elevated. Absolute neurophils are monocytes are elevated. Rest of CBC WNL.   Next Visit: 05/13/2023  Last Visit: 03/09/2023  DX: Age-related osteoporosis without current pathological fracture   Current Dose per office note 03/09/2023: Forteo daily injections were started 6/1/2   Okay to refill Forteo?

## 2023-04-15 ENCOUNTER — Other Ambulatory Visit: Payer: Self-pay | Admitting: Physician Assistant

## 2023-04-15 DIAGNOSIS — M0579 Rheumatoid arthritis with rheumatoid factor of multiple sites without organ or systems involvement: Secondary | ICD-10-CM

## 2023-04-15 NOTE — Telephone Encounter (Signed)
Last Fill: 01/16/2023  Eye exam: 08/14/2022 WNL    Labs: 03/09/2023 WBC count is elevated. Absolute neurophils are monocytes are elevated. Rest of CBC WNL. Alk phos is slightly elevated. Rest of CMP WNL.   Next Visit: 05/13/2023  Last Visit: 03/09/2023  DX: Rheumatoid arthritis with rheumatoid factor of multiple sites without organ or systems involvement   Current Dose per office note 03/09/2023: Plaquenil 200 mg 1 tablet by mouth twice daily.   Okay to refill Plaquenil?

## 2023-04-17 ENCOUNTER — Other Ambulatory Visit: Payer: Self-pay | Admitting: Internal Medicine

## 2023-04-17 DIAGNOSIS — E785 Hyperlipidemia, unspecified: Secondary | ICD-10-CM

## 2023-04-19 ENCOUNTER — Other Ambulatory Visit: Payer: Self-pay | Admitting: Internal Medicine

## 2023-04-19 DIAGNOSIS — F3341 Major depressive disorder, recurrent, in partial remission: Secondary | ICD-10-CM

## 2023-04-19 DIAGNOSIS — L280 Lichen simplex chronicus: Secondary | ICD-10-CM

## 2023-04-28 ENCOUNTER — Other Ambulatory Visit: Payer: Self-pay | Admitting: Internal Medicine

## 2023-04-28 ENCOUNTER — Ambulatory Visit: Payer: Medicare Other | Admitting: Internal Medicine

## 2023-04-28 DIAGNOSIS — K279 Peptic ulcer, site unspecified, unspecified as acute or chronic, without hemorrhage or perforation: Secondary | ICD-10-CM

## 2023-04-29 NOTE — Progress Notes (Deleted)
Office Visit Note  Patient: Abigail Lamb             Date of Birth: [birth year]           MRN: [id number]             PCP: Etta Grandchild, MD Referring: Etta Grandchild, MD Visit Date: 05/13/2023 Occupation: @GUAROCC @  Subjective:  No chief complaint on file.   History of Present Illness: Abigail Lamb is a 72 y.o. female ***     Activities of Daily Living:  Patient reports morning stiffness for *** {minute/hour:19697}.   Patient {ACTIONS;DENIES/REPORTS:21021675::"Denies"} nocturnal pain.  Difficulty dressing/grooming: {ACTIONS;DENIES/REPORTS:21021675::"Denies"} Difficulty climbing stairs: {ACTIONS;DENIES/REPORTS:21021675::"Denies"} Difficulty getting out of chair: {ACTIONS;DENIES/REPORTS:21021675::"Denies"} Difficulty using hands for taps, buttons, cutlery, and/or writing: {ACTIONS;DENIES/REPORTS:21021675::"Denies"}  No Rheumatology ROS completed.   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) 11/08/2021   Gastroparesis 11/07/2021   Irritable bowel syndrome with both constipation and diarrhea 11/07/2021   Encounter for general adult medical examination with abnormal findings 11/07/2021   Leukocytosis 11/07/2021   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   Routine general medical examination at a health care facility 08/02/2020   Visit for screening mammogram 08/02/2020   Rheumatoid arthritis involving multiple sites with positive rheumatoid factor (HCC) 08/02/2020   Hyperlipidemia LDL goal <130 08/02/2020   Tobacco abuse 08/02/2020   Dupuytren's contracture of right hand 08/02/2020   Pulmonary emphysema (HCC)    Stage 2 moderate COPD by GOLD classification (HCC) 01/30/2020   Neurodermatitis 03/28/2019   Primary insomnia 03/28/2019   Recurrent depression (HCC) 03/28/2019     Past Medical History:  Diagnosis Date   Celiac disease    GERD (gastroesophageal reflux disease)    MGUS (monoclonal gammopathy of unknown significance)    per patient    Osteoporosis    per patient, diagnosed by Dr. Darral Dash in [facility]   PUD (peptic ulcer disease)    Pulmonary emphysema (HCC)    Recurrent depression (HCC)     Family History  Problem Relation Age of Onset   CVA Mother    CAD Mother    Diabetes Mother    COPD Mother    Fibromyalgia Mother    Depression Mother    Hypertension Mother    Emphysema Mother        smoked   Heart attack Mother    Prostate cancer Father    Cancer - Prostate Father    Rheum arthritis Sister    Skin cancer Brother    Kidney Stones Brother    Hernia Brother    Depression Daughter    Healthy Son    Healthy Son    Past Surgical History:  Procedure Laterality Date   ABCESS DRAINAGE Left    under left arm   TONSILLECTOMY  1977   Social History   Social History Narrative   Not on file   Immunization History  Administered Date(s) Administered   Fluad Quad(high Dose 65+) 11/08/2021, 10/27/2022   Influenza, High Dose Seasonal PF 12/30/2019   Influenza-Unspecified 01/04/2018, 12/15/2018, 09/11/2020   PNEUMOCOCCAL CONJUGATE-20 11/08/2021   Pneumococcal Polysaccharide-23 08/02/2020   Tdap 11/05/2020   Zoster Recombinat (Shingrix) 03/20/2021, 11/27/2021     Objective: Vital Signs: There were no vitals taken for this visit.   Physical Exam   Musculoskeletal Exam: ***  CDAI Exam: CDAI  Score: -- Patient Global: --; Provider Global: -- Swollen: --; Tender: -- Joint Exam 05/13/2023   No joint exam has been documented for this visit   There is currently no information documented on the homunculus. Go to the Rheumatology activity and complete the homunculus joint exam.  Investigation: No additional findings.  Imaging: No results found.  Recent Labs: Lab Results  Component Value Date   WBC 13.3 (H) 03/09/2023   HGB  13.3 03/09/2023   PLT 232 03/09/2023   NA 141 03/09/2023   K 4.2 03/09/2023   CL 103 03/09/2023   CO2 31 03/09/2023   GLUCOSE 66 03/09/2023   BUN 20 03/09/2023   CREATININE 0.61 03/09/2023   BILITOT 0.4 03/09/2023   ALKPHOS 152 (H) 11/28/2022   AST 21 03/09/2023   ALT 11 03/09/2023   PROT 7.3 03/09/2023   ALBUMIN 4.1 11/28/2022   CALCIUM 9.7 03/09/2023   GFRAA 107 10/26/2020   QFTBGOLDPLUS NEGATIVE 09/17/2020    Speciality Comments: PLQ Eye Exam: 08/14/2022 WNL @ [facility] Follow up 1 year.   Humira and Orencia were discontinued in the past due to frequent infections per patient.  Forteo started 05/29/22  Procedures:  No procedures performed Allergies: Dust mite extract   Assessment / Plan:     Visit Diagnoses: Rheumatoid arthritis with rheumatoid factor of multiple sites without organ or systems involvement (HCC)  Rheumatoid nodulosis (HCC)  High risk medication use  Primary osteoarthritis of both knees  Monoclonal gammopathy  Age-related osteoporosis without current pathological fracture  Vitamin D deficiency  Stage 2 moderate COPD by GOLD classification (HCC)  Prediabetes  Lung nodules  Kidney stone  Tobacco abuse  Recurrent depression (HCC)  Panlobular emphysema (HCC)  Hyperlipidemia LDL goal <130  Primary insomnia  Orders: No orders of the defined types were placed in this encounter.  No orders of the defined types were placed in this encounter.   Face-to-face time spent with patient was *** minutes. Greater than 50% of time was spent in counseling and coordination of care.  Follow-Up Instructions: No follow-ups on file.   Gearldine Bienenstock, PA-C  Note - This record has been created using Dragon software.  Chart creation errors have been sought, but may not always  have been located. Such creation errors do not reflect on  the standard of medical care.

## 2023-05-11 NOTE — Progress Notes (Unsigned)
Office Visit Note  Patient: Abigail Lamb             Date of Birth: [birth year]           MRN: [id number]             PCP: Etta Grandchild, MD Referring: Etta Grandchild, MD Visit Date: 05/14/2023 Occupation: @GUAROCC @  Subjective:  No chief complaint on file.   History of Present Illness: Abigail Lamb is a 71 y.o. female ***     Activities of Daily Living:  Patient reports morning stiffness for *** {minute/hour:19697}.   Patient {ACTIONS;DENIES/REPORTS:21021675::"Denies"} nocturnal pain.  Difficulty dressing/grooming: {ACTIONS;DENIES/REPORTS:21021675::"Denies"} Difficulty climbing stairs: {ACTIONS;DENIES/REPORTS:21021675::"Denies"} Difficulty getting out of chair: {ACTIONS;DENIES/REPORTS:21021675::"Denies"} Difficulty using hands for taps, buttons, cutlery, and/or writing: {ACTIONS;DENIES/REPORTS:21021675::"Denies"}  No Rheumatology ROS completed.   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) 11/08/2021   Gastroparesis 11/07/2021   Irritable bowel syndrome with both constipation and diarrhea 11/07/2021   Encounter for general adult medical examination with abnormal findings 11/07/2021   Leukocytosis 11/07/2021   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   Routine general medical examination at a health care facility 08/02/2020   Visit for screening mammogram 08/02/2020   Rheumatoid arthritis involving multiple sites with positive rheumatoid factor (HCC) 08/02/2020   Hyperlipidemia LDL goal <130 08/02/2020   Tobacco abuse 08/02/2020   Dupuytren's contracture of right hand 08/02/2020   Pulmonary emphysema (HCC)    Stage 2 moderate COPD by GOLD classification (HCC) 01/30/2020   Neurodermatitis 03/28/2019   Primary insomnia 03/28/2019   Recurrent depression (HCC) 03/28/2019     Past Medical History:  Diagnosis Date   Celiac disease    GERD (gastroesophageal reflux disease)    MGUS (monoclonal gammopathy of unknown significance)    per patient    Osteoporosis    per patient, diagnosed by Dr. Darral Dash in [facility]   PUD (peptic ulcer disease)    Pulmonary emphysema (HCC)    Recurrent depression (HCC)     Family History  Problem Relation Age of Onset   CVA Mother    CAD Mother    Diabetes Mother    COPD Mother    Fibromyalgia Mother    Depression Mother    Hypertension Mother    Emphysema Mother        smoked   Heart attack Mother    Prostate cancer Father    Cancer - Prostate Father    Rheum arthritis Sister    Skin cancer Brother    Kidney Stones Brother    Hernia Brother    Depression Daughter    Healthy Son    Healthy Son    Past Surgical History:  Procedure Laterality Date   ABCESS DRAINAGE Left    under left arm   TONSILLECTOMY  1977   Social History   Social History Narrative   Not on file   Immunization History  Administered Date(s) Administered   Fluad Quad(high Dose 65+) 11/08/2021, 10/27/2022   Influenza, High Dose Seasonal PF 12/30/2019   Influenza-Unspecified 01/04/2018, 12/15/2018, 09/11/2020   PNEUMOCOCCAL CONJUGATE-20 11/08/2021   Pneumococcal Polysaccharide-23 08/02/2020   Tdap 11/05/2020   Zoster Recombinat (Shingrix) 03/20/2021, 11/27/2021     Objective: Vital Signs: There were no vitals taken for this visit.   Physical Exam   Musculoskeletal Exam: ***  CDAI Exam: CDAI  Score: -- Patient Global: --; Provider Global: -- Swollen: --; Tender: -- Joint Exam 05/14/2023   No joint exam has been documented for this visit   There is currently no information documented on the homunculus. Go to the Rheumatology activity and complete the homunculus joint exam.  Investigation: No additional findings.  Imaging: No results found.  Recent Labs: Lab Results  Component Value Date   WBC 13.3 (H) 03/09/2023   HGB  13.3 03/09/2023   PLT 232 03/09/2023   NA 141 03/09/2023   K 4.2 03/09/2023   CL 103 03/09/2023   CO2 31 03/09/2023   GLUCOSE 66 03/09/2023   BUN 20 03/09/2023   CREATININE 0.61 03/09/2023   BILITOT 0.4 03/09/2023   ALKPHOS 152 (H) 11/28/2022   AST 21 03/09/2023   ALT 11 03/09/2023   PROT 7.3 03/09/2023   ALBUMIN 4.1 11/28/2022   CALCIUM 9.7 03/09/2023   GFRAA 107 10/26/2020   QFTBGOLDPLUS NEGATIVE 09/17/2020    Speciality Comments: PLQ Eye Exam: 08/14/2022 WNL @ [facility] Follow up 1 year.   Humira and Orencia were discontinued in the past due to frequent infections per patient.  Forteo started 05/29/22  Procedures:  No procedures performed Allergies: Dust mite extract   Assessment / Plan:     Visit Diagnoses: No diagnosis found.  Orders: No orders of the defined types were placed in this encounter.  No orders of the defined types were placed in this encounter.   Face-to-face time spent with patient was *** minutes. Greater than 50% of time was spent in counseling and coordination of care.  Follow-Up Instructions: No follow-ups on file.   Ellen Henri, CMA  Note - This record has been created using Animal nutritionist.  Chart creation errors have been sought, but may not always  have been located. Such creation errors do not reflect on  the standard of medical care.

## 2023-05-13 ENCOUNTER — Ambulatory Visit (INDEPENDENT_AMBULATORY_CARE_PROVIDER_SITE_OTHER): Payer: Medicare Other

## 2023-05-13 ENCOUNTER — Ambulatory Visit (INDEPENDENT_AMBULATORY_CARE_PROVIDER_SITE_OTHER): Payer: Medicare Other | Admitting: Internal Medicine

## 2023-05-13 ENCOUNTER — Ambulatory Visit: Payer: Medicare Other | Admitting: Physician Assistant

## 2023-05-13 VITALS — BP 102/68 | HR 88 | Temp 97.9°F | Resp 16 | Ht 62.0 in | Wt 82.2 lb

## 2023-05-13 DIAGNOSIS — M0630 Rheumatoid nodule, unspecified site: Secondary | ICD-10-CM

## 2023-05-13 DIAGNOSIS — M0579 Rheumatoid arthritis with rheumatoid factor of multiple sites without organ or systems involvement: Secondary | ICD-10-CM | POA: Diagnosis not present

## 2023-05-13 DIAGNOSIS — I950 Idiopathic hypotension: Secondary | ICD-10-CM

## 2023-05-13 DIAGNOSIS — Z0001 Encounter for general adult medical examination with abnormal findings: Secondary | ICD-10-CM | POA: Diagnosis not present

## 2023-05-13 DIAGNOSIS — R1033 Periumbilical pain: Secondary | ICD-10-CM | POA: Insufficient documentation

## 2023-05-13 DIAGNOSIS — J449 Chronic obstructive pulmonary disease, unspecified: Secondary | ICD-10-CM

## 2023-05-13 DIAGNOSIS — R058 Other specified cough: Secondary | ICD-10-CM | POA: Diagnosis not present

## 2023-05-13 DIAGNOSIS — M170 Bilateral primary osteoarthritis of knee: Secondary | ICD-10-CM

## 2023-05-13 DIAGNOSIS — R059 Cough, unspecified: Secondary | ICD-10-CM | POA: Diagnosis not present

## 2023-05-13 DIAGNOSIS — R1115 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: Secondary | ICD-10-CM | POA: Insufficient documentation

## 2023-05-13 DIAGNOSIS — Z79899 Other long term (current) drug therapy: Secondary | ICD-10-CM

## 2023-05-13 DIAGNOSIS — Z720 Tobacco use: Secondary | ICD-10-CM

## 2023-05-13 DIAGNOSIS — F5101 Primary insomnia: Secondary | ICD-10-CM

## 2023-05-13 DIAGNOSIS — N200 Calculus of kidney: Secondary | ICD-10-CM

## 2023-05-13 DIAGNOSIS — R7303 Prediabetes: Secondary | ICD-10-CM

## 2023-05-13 DIAGNOSIS — E785 Hyperlipidemia, unspecified: Secondary | ICD-10-CM

## 2023-05-13 DIAGNOSIS — R918 Other nonspecific abnormal finding of lung field: Secondary | ICD-10-CM

## 2023-05-13 DIAGNOSIS — M810 Age-related osteoporosis without current pathological fracture: Secondary | ICD-10-CM

## 2023-05-13 DIAGNOSIS — F339 Major depressive disorder, recurrent, unspecified: Secondary | ICD-10-CM

## 2023-05-13 DIAGNOSIS — D472 Monoclonal gammopathy: Secondary | ICD-10-CM

## 2023-05-13 DIAGNOSIS — E559 Vitamin D deficiency, unspecified: Secondary | ICD-10-CM

## 2023-05-13 DIAGNOSIS — J431 Panlobular emphysema: Secondary | ICD-10-CM

## 2023-05-13 NOTE — Progress Notes (Unsigned)
Subjective:  Patient ID: Abigail Lamb, female    DOB: Aug 17, 1951  Age: 72 y.o. MRN: [id number]  CC: Annual Exam, COPD, Cough, and Osteoarthritis   HPI Abigail Lamb presents for a CPX and f/up ----  She complains of persistent cough productive of yellow phlegm.  Outpatient Medications Prior to Visit  Medication Sig Dispense Refill   albuterol (VENTOLIN HFA) 108 (90 Base) MCG/ACT inhaler Take 1-2 puffs every 4-6 hrs prn shortness of breath. Needs ventolin.     Cholecalciferol (VITAMIN D3) 125 MCG (5000 UT) CAPS Take 5,000 Units by mouth daily.     clindamycin (CLEOCIN T) 1 % external solution as needed.      clobetasol (OLUX) 0.05 % topical foam Apply topically 2 (two) times daily.     clonazePAM (KLONOPIN) 0.5 MG tablet Take 1 tablet (0.5 mg total) by mouth 2 (two) times daily as needed for anxiety. 60 tablet 1   co-enzyme Q-10 30 MG capsule Take by mouth.     colesevelam (WELCHOL) 625 MG tablet TAKE 3 TABLETS BY MOUTH TWICE A DAY WITH MEAL 180 tablet 0   dicyclomine (BENTYL) 10 MG capsule TAKE 1 CAPSULE (10 MG TOTAL) BY MOUTH 4 TIMES A DAY BEFORE MEALS AND AT BEDTIME 120 capsule 0   DULoxetine (CYMBALTA) 30 MG capsule Take 3 capsules (90 mg total) by mouth daily. For total of 90 mg daily 270 capsule 0   famotidine (PEPCID) 40 MG tablet TAKE 1 TABLET BY MOUTH TWICE A DAY 180 tablet 1   fluocinonide-emollient (LIDEX-E) 0.05 % cream Apply 1 application topically 2 (two) times daily. 60 g 3   fluticasone (FLONASE) 50 MCG/ACT nasal spray Place 1 spray into both nostrils daily. 16 g 11   folic acid (FOLVITE) 1 MG tablet Take by mouth.     FORTEO 600 MCG/2.4ML SOPN INJECT 0.08ML ( ) UNDER THE SKIN ONCE DAILY 9.6 mL 0   hydroxychloroquine (PLAQUENIL) 200 MG tablet TAKE 1 TABLET BY MOUTH TWICE A DAY 180 tablet 0   hydrOXYzine (ATARAX) 10 MG tablet TAKE 1 TABLET BY MOUTH EVERY 8 HOURS AS NEEDED 270 tablet 0   Insulin Pen Needle 31G X 5 MM MISC Use to inject Forteo once daily. DO NOT  REUSE NEEDLE. 100 each 2   loratadine (CLARITIN) 10 MG tablet Take 10 mg by mouth daily.     ondansetron (ZOFRAN) 4 MG tablet TAKE 1 TABLET BY MOUTH EVERY 8 HOURS AS NEEDED FOR NAUSEA AND VOMITING 40 tablet 2   sulfaSALAzine (AZULFIDINE) 500 MG tablet Take 2 tablets (1,000 mg total) by mouth 2 (two) times daily. 120 tablet 2   umeclidinium-vilanterol (ANORO ELLIPTA) 62.5-25 MCG/ACT AEPB Inhale 1 puff into the lungs daily. 1 each 11   varenicline (CHANTIX) 1 MG tablet TAKE 1 TABLET BY MOUTH TWICE A DAY (Patient not taking: Reported on 05/14/2023) 180 tablet 0   celecoxib (CELEBREX) 50 MG capsule TAKE 1 CAPSULE BY MOUTH 2 TIMES DAILY. 60 capsule 1   dronabinol (MARINOL) 2.5 MG capsule TAKE 1 CAPSULE BY MOUTH 2 TIMES DAILY BEFORE LUNCH AND SUPPER (Patient not taking: Reported on 05/14/2023) 180 capsule 0   TUBERCULIN SYR 1CC/25GX5/8" 25G X 5/8" 1 ML MISC Use 1 syringe weekly for Methotrexate injection. 12 each 3   No facility-administered medications prior to visit.    ROS Review of Systems  Constitutional:  Positive for fatigue. Negative for appetite change, chills, diaphoresis, fever and unexpected weight change.  HENT: Negative.    Eyes: Negative.  Respiratory:  Positive for cough and shortness of breath. Negative for chest tightness, wheezing and stridor.   Cardiovascular:  Negative for chest pain, palpitations and leg swelling.  Gastrointestinal:  Negative for abdominal pain, constipation, diarrhea, nausea and vomiting.  Endocrine: Negative.   Genitourinary: Negative.  Negative for difficulty urinating.  Musculoskeletal:  Positive for arthralgias. Negative for back pain, joint swelling, myalgias and neck pain.  Skin: Negative.  Negative for color change and pallor.  Neurological:  Negative for dizziness, weakness and headaches.  Hematological:  Negative for adenopathy. Does not bruise/bleed easily.  Psychiatric/Behavioral: Negative.      Objective:  BP 102/68 (BP Location: Left Arm,  Patient Position: Sitting, Cuff Size: Normal)   Pulse 88   Temp 97.9 F (36.6 C) (Oral)   Resp 16   Ht 5\' 2"  (1.575 m)   Wt 82 lb 3.2 oz (37.3 kg)   SpO2 (!) 87%   BMI 15.03 kg/m   BP Readings from Last 3 Encounters:  05/14/23 114/74  05/13/23 102/68  03/09/23 101/68    Wt Readings from Last 3 Encounters:  05/14/23 80 lb (36.3 kg)  05/13/23 82 lb 3.2 oz (37.3 kg)  03/19/23 83 lb (37.6 kg)    Physical Exam Vitals reviewed.  Constitutional:      Appearance: Normal appearance.  HENT:     Mouth/Throat:     Mouth: Mucous membranes are moist.  Eyes:     General: No scleral icterus.    Conjunctiva/sclera: Conjunctivae normal.  Cardiovascular:     Rate and Rhythm: Normal rate and regular rhythm.     Heart sounds: No murmur heard. Pulmonary:     Effort: Pulmonary effort is normal.     Breath sounds: No stridor. No wheezing, rhonchi or rales.  Abdominal:     General: Abdomen is flat.     Palpations: There is no mass.     Tenderness: There is no abdominal tenderness. There is no guarding.     Hernia: No hernia is present.  Musculoskeletal:        General: Normal range of motion.     Cervical back: Neck supple.     Right lower leg: No edema.     Left lower leg: No edema.  Lymphadenopathy:     Cervical: No cervical adenopathy.  Skin:    General: Skin is warm and dry.     Coloration: Skin is not pale.  Neurological:     General: No focal deficit present.     Mental Status: She is alert. Mental status is at baseline.  Psychiatric:        Mood and Affect: Mood normal.        Behavior: Behavior normal.     Lab Results  Component Value Date   WBC 9.9 05/14/2023   HGB 14.0 05/14/2023   HCT 43.0 05/14/2023   PLT 279 05/14/2023   GLUCOSE 88 05/14/2023   CHOL 164 10/27/2022   TRIG 132.0 10/27/2022   HDL 66.20 10/27/2022   LDLCALC 71 10/27/2022   ALT 13 05/14/2023   AST 23 05/14/2023   NA 140 05/14/2023   K 4.9 05/14/2023   CL 105 05/14/2023   CREATININE 0.64  05/14/2023   BUN 18 05/14/2023   CO2 27 05/14/2023   TSH 3.22 11/07/2021   HGBA1C 5.5 08/02/2020    DG Bone Survey Met  Result Date: 12/07/2022 CLINICAL DATA:  MGUS, assess for lytic lesions. EXAM: METASTATIC BONE SURVEY COMPARISON:  12/19/2021. FINDINGS: There is a  possible small lytic lesion in the proximal humeral shaft on the right. Severe degenerative changes are noted at the wrists bilaterally. Degenerative changes are present in the cervical, thoracic, and lumbar spine. There is atherosclerotic calcification of the aorta. There is hyperinflation of the lungs. IMPRESSION: 1. Small lytic lesion in the proximal humeral shaft on the right. 2. No other lytic or destructive lesion is seen in the bones. 3. Aortic atherosclerosis. Electronically Signed   By: Thornell Sartorius M.D.   On: 12/07/2022 00:37   DG Chest 2 View  Result Date: 05/13/2023 CLINICAL DATA:  Cough EXAM: CHEST - 2 VIEW COMPARISON:  11/07/2021 FINDINGS: The heart size and mediastinal contours are within normal limits. Hyperinflated, hyperlucent lungs. No focal airspace consolidation, pleural effusion, or pneumothorax. Nipple shadows are seen bilaterally. The visualized skeletal structures are unremarkable. IMPRESSION: 1. No active cardiopulmonary disease. 2. COPD. Electronically Signed   By: Duanne Guess D.O.   On: 05/13/2023 15:09       Assessment & Plan:   Cough productive of yellow sputum- Chest x-ray is negative for mass or infiltrate. -     DG Chest 2 View; Future  Rheumatoid arthritis involving multiple sites with positive rheumatoid factor (HCC) -     Celecoxib; Take 1 capsule (50 mg total) by mouth 2 (two) times daily.  Dispense: 60 capsule; Refill: 1  Idiopathic hypotension- Will evaluate for secondary causes. -     Cortisol; Future -     TSH; Future  Encounter for general adult medical examination with abnormal findings- Exam completed, labs reviewed, vaccines are up-to-date, cancer screenings addressed, patient  education was given.     Follow-up: Return in about 6 months (around 11/13/2023).  Sanda Linger, MD

## 2023-05-13 NOTE — Patient Instructions (Signed)

## 2023-05-14 ENCOUNTER — Ambulatory Visit: Payer: Medicare Other | Attending: Physician Assistant | Admitting: Physician Assistant

## 2023-05-14 ENCOUNTER — Encounter: Payer: Self-pay | Admitting: Physician Assistant

## 2023-05-14 VITALS — BP 114/74 | HR 92 | Resp 16 | Ht 62.0 in | Wt 80.0 lb

## 2023-05-14 DIAGNOSIS — M810 Age-related osteoporosis without current pathological fracture: Secondary | ICD-10-CM

## 2023-05-14 DIAGNOSIS — F5101 Primary insomnia: Secondary | ICD-10-CM

## 2023-05-14 DIAGNOSIS — M170 Bilateral primary osteoarthritis of knee: Secondary | ICD-10-CM

## 2023-05-14 DIAGNOSIS — D472 Monoclonal gammopathy: Secondary | ICD-10-CM

## 2023-05-14 DIAGNOSIS — M0579 Rheumatoid arthritis with rheumatoid factor of multiple sites without organ or systems involvement: Secondary | ICD-10-CM

## 2023-05-14 DIAGNOSIS — M25552 Pain in left hip: Secondary | ICD-10-CM

## 2023-05-14 DIAGNOSIS — R918 Other nonspecific abnormal finding of lung field: Secondary | ICD-10-CM

## 2023-05-14 DIAGNOSIS — M0630 Rheumatoid nodule, unspecified site: Secondary | ICD-10-CM | POA: Diagnosis not present

## 2023-05-14 DIAGNOSIS — F339 Major depressive disorder, recurrent, unspecified: Secondary | ICD-10-CM

## 2023-05-14 DIAGNOSIS — Z720 Tobacco use: Secondary | ICD-10-CM

## 2023-05-14 DIAGNOSIS — R7303 Prediabetes: Secondary | ICD-10-CM

## 2023-05-14 DIAGNOSIS — Z79899 Other long term (current) drug therapy: Secondary | ICD-10-CM

## 2023-05-14 DIAGNOSIS — J449 Chronic obstructive pulmonary disease, unspecified: Secondary | ICD-10-CM

## 2023-05-14 DIAGNOSIS — J431 Panlobular emphysema: Secondary | ICD-10-CM

## 2023-05-14 DIAGNOSIS — N200 Calculus of kidney: Secondary | ICD-10-CM | POA: Diagnosis not present

## 2023-05-14 DIAGNOSIS — E559 Vitamin D deficiency, unspecified: Secondary | ICD-10-CM

## 2023-05-14 DIAGNOSIS — E785 Hyperlipidemia, unspecified: Secondary | ICD-10-CM

## 2023-05-14 LAB — CBC WITH DIFFERENTIAL/PLATELET
Absolute Monocytes: 950 cells/uL (ref 200–950)
HCT: 43 % (ref 35.0–45.0)
MCV: 91.3 fL (ref 80.0–100.0)
Neutro Abs: 5217 cells/uL (ref 1500–7800)
RBC: 4.71 10*6/uL (ref 3.80–5.10)
RDW: 13.1 % (ref 11.0–15.0)

## 2023-05-15 LAB — COMPLETE METABOLIC PANEL WITH GFR
AG Ratio: 1.1 (calc) (ref 1.0–2.5)
ALT: 13 U/L (ref 6–29)
AST: 23 U/L (ref 10–35)
Albumin: 3.8 g/dL (ref 3.6–5.1)
Alkaline phosphatase (APISO): 132 U/L (ref 37–153)
BUN: 18 mg/dL (ref 7–25)
CO2: 27 mmol/L (ref 20–32)
Calcium: 9.4 mg/dL (ref 8.6–10.4)
Chloride: 105 mmol/L (ref 98–110)
Creat: 0.64 mg/dL (ref 0.60–1.00)
Globulin: 3.6 g/dL (calc) (ref 1.9–3.7)
Glucose, Bld: 88 mg/dL (ref 65–99)
Potassium: 4.9 mmol/L (ref 3.5–5.3)
Sodium: 140 mmol/L (ref 135–146)
Total Bilirubin: 0.3 mg/dL (ref 0.2–1.2)
Total Protein: 7.4 g/dL (ref 6.1–8.1)
eGFR: 95 mL/min/{1.73_m2} (ref >=60)

## 2023-05-15 LAB — CBC WITH DIFFERENTIAL/PLATELET
Basophils Absolute: 69 cells/uL (ref 0–200)
Basophils Relative: 0.7 %
Eosinophils Absolute: 257 cells/uL (ref 15–500)
Eosinophils Relative: 2.6 %
Hemoglobin: 14 g/dL (ref 11.7–15.5)
Lymphs Abs: 3406 cells/uL (ref 850–3900)
MCH: 29.7 pg (ref 27.0–33.0)
MCHC: 32.6 g/dL (ref 32.0–36.0)
MPV: 10.4 fL (ref 7.5–12.5)
Monocytes Relative: 9.6 %
Neutrophils Relative %: 52.7 %
Platelets: 279 10*3/uL (ref 140–400)
Total Lymphocyte: 34.4 %
WBC: 9.9 10*3/uL (ref 3.8–10.8)

## 2023-05-15 NOTE — Progress Notes (Signed)
CMP WNL

## 2023-05-15 NOTE — Progress Notes (Signed)
CBC WNL

## 2023-05-17 ENCOUNTER — Other Ambulatory Visit: Payer: Self-pay | Admitting: Internal Medicine

## 2023-05-17 ENCOUNTER — Encounter: Payer: Self-pay | Admitting: Internal Medicine

## 2023-05-17 DIAGNOSIS — I950 Idiopathic hypotension: Secondary | ICD-10-CM | POA: Insufficient documentation

## 2023-05-17 DIAGNOSIS — M0579 Rheumatoid arthritis with rheumatoid factor of multiple sites without organ or systems involvement: Secondary | ICD-10-CM

## 2023-05-17 MED ORDER — CELECOXIB 50 MG PO CAPS
50.0000 mg | ORAL_CAPSULE | Freq: Two times a day (BID) | ORAL | 1 refills | Status: DC
Start: 2023-05-17 — End: 2023-07-20

## 2023-05-19 ENCOUNTER — Other Ambulatory Visit: Payer: Self-pay | Admitting: Internal Medicine

## 2023-05-22 ENCOUNTER — Other Ambulatory Visit (INDEPENDENT_AMBULATORY_CARE_PROVIDER_SITE_OTHER): Payer: Medicare Other

## 2023-05-22 DIAGNOSIS — I950 Idiopathic hypotension: Secondary | ICD-10-CM

## 2023-05-22 LAB — CORTISOL: Cortisol, Plasma: 8 ug/dL

## 2023-05-22 LAB — TSH: TSH: 4.12 u[IU]/mL (ref 0.35–5.50)

## 2023-06-05 ENCOUNTER — Other Ambulatory Visit: Payer: Self-pay | Admitting: Hematology and Oncology

## 2023-06-05 ENCOUNTER — Inpatient Hospital Stay: Payer: Medicare Other | Attending: Hematology and Oncology

## 2023-06-05 ENCOUNTER — Other Ambulatory Visit: Payer: Self-pay

## 2023-06-05 DIAGNOSIS — D472 Monoclonal gammopathy: Secondary | ICD-10-CM | POA: Diagnosis not present

## 2023-06-05 DIAGNOSIS — F17210 Nicotine dependence, cigarettes, uncomplicated: Secondary | ICD-10-CM | POA: Insufficient documentation

## 2023-06-05 DIAGNOSIS — Z79899 Other long term (current) drug therapy: Secondary | ICD-10-CM | POA: Insufficient documentation

## 2023-06-05 LAB — CMP (CANCER CENTER ONLY)
ALT: 12 U/L (ref 0–44)
AST: 23 U/L (ref 15–41)
Albumin: 3.8 g/dL (ref 3.5–5.0)
Alkaline Phosphatase: 127 U/L — ABNORMAL HIGH (ref 38–126)
Anion gap: 8 (ref 5–15)
BUN: 19 mg/dL (ref 8–23)
CO2: 25 mmol/L (ref 22–32)
Calcium: 9.5 mg/dL (ref 8.9–10.3)
Chloride: 106 mmol/L (ref 98–111)
Creatinine: 0.65 mg/dL (ref 0.44–1.00)
GFR, Estimated: >60 mL/min (ref >60)
Glucose, Bld: 84 mg/dL (ref 70–99)
Potassium: 4.1 mmol/L (ref 3.5–5.1)
Sodium: 139 mmol/L (ref 135–145)
Total Bilirubin: 0.3 mg/dL (ref 0.3–1.2)
Total Protein: 7.8 g/dL (ref 6.5–8.1)

## 2023-06-05 LAB — CBC WITH DIFFERENTIAL (CANCER CENTER ONLY)
Abs Immature Granulocytes: 0.02 10*3/uL (ref 0.00–0.07)
Basophils Absolute: 0.1 10*3/uL (ref 0.0–0.1)
Basophils Relative: 1 %
Eosinophils Absolute: 0.3 10*3/uL (ref 0.0–0.5)
Eosinophils Relative: 3 %
HCT: 41.4 % (ref 36.0–46.0)
Hemoglobin: 13.5 g/dL (ref 12.0–15.0)
Immature Granulocytes: 0 %
Lymphocytes Relative: 30 %
Lymphs Abs: 2.9 10*3/uL (ref 0.7–4.0)
MCH: 29.7 pg (ref 26.0–34.0)
MCHC: 32.6 g/dL (ref 30.0–36.0)
MCV: 91 fL (ref 80.0–100.0)
Monocytes Absolute: 0.9 10*3/uL (ref 0.1–1.0)
Monocytes Relative: 10 %
Neutro Abs: 5.3 10*3/uL (ref 1.7–7.7)
Neutrophils Relative %: 56 %
Platelet Count: 264 10*3/uL (ref 150–400)
RBC: 4.55 MIL/uL (ref 3.87–5.11)
RDW: 14.8 % (ref 11.5–15.5)
WBC Count: 9.5 10*3/uL (ref 4.0–10.5)
nRBC: 0 % (ref 0.0–0.2)

## 2023-06-05 LAB — LACTATE DEHYDROGENASE: LDH: 228 U/L — ABNORMAL HIGH (ref 98–192)

## 2023-06-10 LAB — KAPPA/LAMBDA LIGHT CHAINS
Kappa free light chain: 63.4 mg/L — ABNORMAL HIGH (ref 3.3–19.4)
Kappa, lambda light chain ratio: 2.55 — ABNORMAL HIGH (ref 0.26–1.65)
Lambda free light chains: 24.9 mg/L (ref 5.7–26.3)

## 2023-06-12 ENCOUNTER — Inpatient Hospital Stay: Payer: Medicare Other | Admitting: Hematology and Oncology

## 2023-06-12 LAB — MULTIPLE MYELOMA PANEL, SERUM
Albumin SerPl Elph-Mcnc: 3.7 g/dL (ref 2.9–4.4)
Albumin/Glob SerPl: 1.1 (ref 0.7–1.7)
Alpha 1: 0.3 g/dL (ref 0.0–0.4)
Alpha2 Glob SerPl Elph-Mcnc: 0.6 g/dL (ref 0.4–1.0)
B-Globulin SerPl Elph-Mcnc: 1.1 g/dL (ref 0.7–1.3)
Gamma Glob SerPl Elph-Mcnc: 1.6 g/dL (ref 0.4–1.8)
Globulin, Total: 3.6 g/dL (ref 2.2–3.9)
IgA: 523 mg/dL — ABNORMAL HIGH (ref 87–352)
IgG (Immunoglobin G), Serum: 1083 mg/dL (ref 586–1602)
IgM (Immunoglobulin M), Srm: 959 mg/dL — ABNORMAL HIGH (ref 26–217)
M Protein SerPl Elph-Mcnc: 0.9 g/dL — ABNORMAL HIGH
Total Protein ELP: 7.3 g/dL (ref 6.0–8.5)

## 2023-06-12 NOTE — Progress Notes (Unsigned)
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 Found during rheumatologist evaluation for RA.  2) 10/12/2020: establish care with Dr. Leonides Schanz. M protein 0.6, IgM kappa specificity. Kappa 49.3, Lambda 20.3, ratio 2.43.   3) 10/24/2020: Bone marrow biopsy revealed normocellular marrow with polytypic plasmacytosis (5%).   Interval History:  Abigail Lamb 72 y.o. female with medical history significant for IgM kappa monoclonal gammopathy who presents for a follow up visit. The patient's last visit was on 05/29/2022. In the interim since the last visit she has had no major changes in her health.  On exam today Abigail Lamb reports *** She is not having any fevers, chills, sweats.  She denies any bone or back pain at this time.  A full 10 point ROS is listed below.  MEDICAL HISTORY:  Past Medical History:  Diagnosis Date   Celiac disease    GERD (gastroesophageal reflux disease)    MGUS (monoclonal gammopathy of unknown significance)    per patient    Osteoporosis    per patient, diagnosed by Dr. Darral Dash in [facility]   PUD (peptic ulcer disease)    Pulmonary emphysema (HCC)    Recurrent depression (HCC)     SURGICAL HISTORY: Past Surgical History:  Procedure Laterality Date   ABCESS DRAINAGE Left    under left arm   TONSILLECTOMY  1977    SOCIAL HISTORY: Social History   Socioeconomic History   Marital status: Divorced    Spouse name: Not on file   Number of children: Not on file   Years of education: Not on file   Highest education level: 12th grade  Occupational History   Not on file  Tobacco Use   Smoking status: Every Day    Packs/day: 0.50    Years: 49.00    Additional pack years:  0.00    Total pack years: 24.50    Types: Cigarettes    Passive exposure: Current   Smokeless tobacco: Never  Vaping Use   Vaping Use: Never used  Substance and Sexual Activity   Alcohol use: Not Currently   Drug use: Never   Sexual activity: Not Currently    Partners: Male  Other Topics Concern   Not on file  Social History Narrative   Not on file   Social Determinants of Health   Financial Resource Strain: Medium Risk (05/13/2023)   Overall Financial Resource Strain (CARDIA)    Difficulty of Paying Living Expenses: Somewhat hard  Food Insecurity: Food Insecurity Present (05/13/2023)   Hunger Vital Sign    Worried About Running Out of Food in the Last Year: Sometimes true    Ran Out of Food in the Last Year: Never true  Transportation Needs: No Transportation Needs (05/13/2023)   PRAPARE - Administrator, Civil Service (Medical): No    Lack of Transportation (Non-Medical): No  Physical Activity: Unknown (05/13/2023)   Exercise Vital Sign    Days of Exercise per Week: 0 days    Minutes of Exercise per Session: Patient declined  Stress: Stress Concern Present (05/13/2023)   [employer] of Occupational Health - Occupational Stress Questionnaire    Feeling of Stress : To some extent  Social Connections: Moderately Isolated (05/13/2023)   Social Connection and Isolation  Panel [NHANES]    Frequency of Communication with Friends and Family: More than three times a week    Frequency of Social Gatherings with Friends and Family: More than three times a week    Attends Religious Services: 1 to 4 times per year    Active Member of [employer] or Organizations: No    Attends Banker Meetings: Patient declined    Marital Status: Widowed  Intimate Partner Violence: Not At Risk (03/19/2023)   Humiliation, Afraid, Rape, and Kick questionnaire    Fear of Current or Ex-Partner: No    Emotionally Abused: No    Physically Abused: No    Sexually Abused: No    FAMILY  HISTORY: Family History  Problem Relation Age of Onset   CVA Mother    CAD Mother    Diabetes Mother    COPD Mother    Fibromyalgia Mother    Depression Mother    Hypertension Mother    Emphysema Mother        smoked   Heart attack Mother    Prostate cancer Father    Cancer - Prostate Father    Rheum arthritis Sister    Skin cancer Brother    Kidney Stones Brother    Hernia Brother    Depression Daughter    Healthy Son    Healthy Son     ALLERGIES:  is allergic to dust mite extract.  MEDICATIONS:  Current Outpatient Medications  Medication Sig Dispense Refill   albuterol (VENTOLIN HFA) 108 (90 Base) MCG/ACT inhaler Take 1-2 puffs every 4-6 hrs prn shortness of breath. Needs ventolin.     celecoxib (CELEBREX) 50 MG capsule Take 1 capsule (50 mg total) by mouth 2 (two) times daily. 60 capsule 1   Cholecalciferol (VITAMIN D3) 125 MCG (5000 UT) CAPS Take 5,000 Units by mouth daily.     clindamycin (CLEOCIN T) 1 % external solution as needed.      clobetasol (OLUX) 0.05 % topical foam Apply topically 2 (two) times daily.     clonazePAM (KLONOPIN) 0.5 MG tablet TAKE 1 TABLET BY MOUTH 2 TIMES DAILY AS NEEDED FOR ANXIETY. 60 tablet 3   co-enzyme Q-10 30 MG capsule Take by mouth.     colesevelam (WELCHOL) 625 MG tablet TAKE 3 TABLETS BY MOUTH TWICE A DAY WITH MEAL 180 tablet 0   dicyclomine (BENTYL) 10 MG capsule TAKE 1 CAPSULE (10 MG TOTAL) BY MOUTH 4 TIMES A DAY BEFORE MEALS AND AT BEDTIME 120 capsule 0   dronabinol (MARINOL) 2.5 MG capsule TAKE 1 CAPSULE BY MOUTH 2 TIMES DAILY BEFORE LUNCH AND SUPPER (Patient not taking: Reported on 05/14/2023) 180 capsule 0   DULoxetine (CYMBALTA) 30 MG capsule Take 3 capsules (90 mg total) by mouth daily. For total of 90 mg daily 270 capsule 0   famotidine (PEPCID) 40 MG tablet TAKE 1 TABLET BY MOUTH TWICE A DAY 180 tablet 1   fluocinonide-emollient (LIDEX-E) 0.05 % cream Apply 1 application topically 2 (two) times daily. 60 g 3   fluticasone  (FLONASE) 50 MCG/ACT nasal spray Place 1 spray into both nostrils daily. 16 g 11   folic acid (FOLVITE) 1 MG tablet Take by mouth.     FORTEO 600 MCG/2.4ML SOPN INJECT 0.08ML ( ) UNDER THE SKIN ONCE DAILY 9.6 mL 0   hydroxychloroquine (PLAQUENIL) 200 MG tablet TAKE 1 TABLET BY MOUTH TWICE A DAY 180 tablet 0   hydrOXYzine (ATARAX) 10 MG tablet TAKE 1 TABLET BY MOUTH EVERY  8 HOURS AS NEEDED 270 tablet 0   Insulin Pen Needle 31G X 5 MM MISC Use to inject Forteo once daily. DO NOT REUSE NEEDLE. 100 each 2   loratadine (CLARITIN) 10 MG tablet Take 10 mg by mouth daily.     ondansetron (ZOFRAN) 4 MG tablet TAKE 1 TABLET BY MOUTH EVERY 8 HOURS AS NEEDED FOR NAUSEA AND VOMITING 40 tablet 2   sulfaSALAzine (AZULFIDINE) 500 MG tablet Take 2 tablets (1,000 mg total) by mouth 2 (two) times daily. 120 tablet 2   TUBERCULIN SYR 1CC/25GX5/8" 25G X 5/8" 1 ML MISC Use 1 syringe weekly for Methotrexate injection. 12 each 3   umeclidinium-vilanterol (ANORO ELLIPTA) 62.5-25 MCG/ACT AEPB Inhale 1 puff into the lungs daily. 1 each 11   varenicline (CHANTIX) 1 MG tablet TAKE 1 TABLET BY MOUTH TWICE A DAY (Patient not taking: Reported on 05/14/2023) 180 tablet 0   No current facility-administered medications for this visit.    REVIEW OF SYSTEMS:   Constitutional: ( - ) fevers, ( - )  chills , ( - ) night sweats Eyes: ( - ) blurriness of vision, ( - ) double vision, ( - ) watery eyes Ears, nose, mouth, throat, and face: ( - ) mucositis, ( - ) sore throat Respiratory: ( - ) cough, ( - ) dyspnea, ( - ) wheezes Cardiovascular: ( - ) palpitation, ( - ) chest discomfort, ( - ) lower extremity swelling Gastrointestinal:  ( - ) nausea, ( - ) heartburn, ( - ) change in bowel habits Skin: ( - ) abnormal skin rashes Lymphatics: ( - ) new lymphadenopathy, ( - ) easy bruising Neurological: ( - ) numbness, ( - ) tingling, ( - ) new weaknesses Behavioral/Psych: ( - ) mood change, ( - ) new changes  All other systems were  reviewed with the patient and are negative.  PHYSICAL EXAMINATION: ECOG PERFORMANCE STATUS: 1 - Symptomatic but completely ambulatory  There were no vitals filed for this visit.   There were no vitals filed for this visit.    GENERAL: well appearing elderly Caucasian female in NAD  SKIN: skin color, texture, turgor are normal, no rashes or significant lesions EYES: conjunctiva are pink and non-injected, sclera clear LUNGS: clear to auscultation and percussion with normal breathing effort HEART: regular rate & rhythm and no murmurs and no lower extremity edema Musculoskeletal: no cyanosis of digits and no clubbing. Ulnar deviation of fingers bilaterally   PSYCH: alert & oriented x 3, fluent speech NEURO: no focal motor/sensory deficits  LABORATORY DATA:  I have reviewed the data as listed    Latest Ref Rng & Units 06/05/2023    1:24 PM 05/14/2023    2:51 PM 03/09/2023    3:02 PM  CBC  WBC 4.0 - 10.5 K/uL 9.5  9.9  13.3   Hemoglobin 12.0 - 15.0 g/dL 16.1  09.6  04.5   Hematocrit 36.0 - 46.0 % 41.4  43.0  39.1   Platelets 150 - 400 K/uL 264  279  232        Latest Ref Rng & Units 06/05/2023    1:24 PM 05/14/2023    2:51 PM 03/09/2023    3:02 PM  CMP  Glucose 70 - 99 mg/dL 84  88  66   BUN 8 - 23 mg/dL 19  18  20    Creatinine 0.44 - 1.00 mg/dL 4.09  8.11  9.14   Sodium 135 - 145 mmol/L 139  140  141  Potassium 3.5 - 5.1 mmol/L 4.1  4.9  4.2   Chloride 98 - 111 mmol/L 106  105  103   CO2 22 - 32 mmol/L 25  27  31    Calcium 8.9 - 10.3 mg/dL 9.5  9.4  9.7   Total Protein 6.5 - 8.1 g/dL 7.8  7.4  7.3   Total Bilirubin 0.3 - 1.2 mg/dL 0.3  0.3  0.4   Alkaline Phos 38 - 126 U/L 127     AST 15 - 41 U/L 23  23  21    ALT 0 - 44 U/L 12  13  11      Lab Results  Component Value Date   MPROTEIN 1.1 (H) 11/28/2022   MPROTEIN 0.6 (H) 05/29/2022   MPROTEIN 1.2 (H) 11/28/2021   Lab Results  Component Value Date   KPAFRELGTCHN 63.4 (H) 06/05/2023   KPAFRELGTCHN 71.9 (H) 11/28/2022    KPAFRELGTCHN 76.5 (H) 05/29/2022   LAMBDASER 24.9 06/05/2023   LAMBDASER 26.4 (H) 11/28/2022   LAMBDASER 28.1 (H) 05/29/2022   KAPLAMBRATIO 2.55 (H) 06/05/2023   KAPLAMBRATIO 2.72 (H) 11/28/2022   KAPLAMBRATIO 15.57 (H) 11/28/2022    RADIOGRAPHIC STUDIES: DG Chest 2 View  Result Date: 05/13/2023 CLINICAL DATA:  Cough EXAM: CHEST - 2 VIEW COMPARISON:  11/07/2021 FINDINGS: The heart size and mediastinal contours are within normal limits. Hyperinflated, hyperlucent lungs. No focal airspace consolidation, pleural effusion, or pneumothorax. Nipple shadows are seen bilaterally. The visualized skeletal structures are unremarkable. IMPRESSION: 1. No active cardiopulmonary disease. 2. COPD. Electronically Signed   By: Duanne Guess D.O.   On: 05/13/2023 15:09    ASSESSMENT & PLAN Roux Kohring 72 y.o. female with medical history significant for IgM kappa monoclonal gammopathy who presents for a follow up visit.    After review the labs, review the records, discussion the patient the findings are most consistent with a monoclonal myopathy of undetermined significance.  She underwent bone marrow biopsy which did show a 5% plasma cell population, however this was polytypic in nature.  As such the patient does not require any treatment but only observation from this point moving forward.  The only missing piece of her work-up at this time is her metastatic survey which she requested be performed after December 29, 2020 (but was lost to follow up).  Overall she is at her baseline level of health at this time with no other issues that need addressed.  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 No CRAB criteria met. Bone marrow biopsy on 10/24/2020 revealed normocellular marrow with polytypic plasmacytosis (5%).  --assure yearly metastatic survey and UPEP. Testing due in Dec 2023.  --assure SPEP, SFLC, CMP, CBC, and LDH at each  visit --labs today show white blood cell *** --RTC q 6 months with labs 1 week before   #Leukocytosis- improved #Thrombocytosis-resolved --WBC 9.8, Plt 297 --recheck and evaluated on labs today.  --continue to monitor   No orders of the defined types were placed in this encounter.   All questions were answered. The patient knows to call the clinic with any problems, questions or concerns.  A total of more than 30 minutes were spent on this encounter and over half of that time was spent on counseling and coordination of care as outlined above.   Ulysees Barns, MD Department of Hematology/Oncology [facility] Phone: [phone] Pager: [phone] Email: [email or web address]  06/12/2023 7:24 AM  Literature Support:  1) Kyle RA, Durie BG, Rajkumar SV, et al. Monoclonal gammopathy of undetermined significance (MGUS) and smoldering (asymptomatic) multiple myeloma: IMWG consensus perspectives risk factors for progression and guidelines for monitoring and management. Leukemia. 2010;24(6):1121-1127. doi:10.1038/leu.2010.60   --If a patient with apparent MGUS has a serum monoclonal protein >15 g/l, IgA or IgM protein type, or an abnormal FLC ratio, a BM aspirate and biopsy should be carried out at baseline to rule out underlying PC malignancy.   2) Ronaldo Miyamoto RA, Therneau TM, Rajkumar SV, et al. Prevalence of monoclonal gammopathy of undetermined significance. N Engl J Med 2006;354:1362-1369   --MGUS was found in 3.2 percent of persons 66 years of age or older and 5.3 percent of persons 74 years of age or older.

## 2023-06-15 ENCOUNTER — Telehealth: Payer: Self-pay | Admitting: Hematology and Oncology

## 2023-06-15 ENCOUNTER — Other Ambulatory Visit: Payer: Self-pay | Admitting: Internal Medicine

## 2023-06-15 DIAGNOSIS — E785 Hyperlipidemia, unspecified: Secondary | ICD-10-CM

## 2023-06-17 ENCOUNTER — Other Ambulatory Visit: Payer: Self-pay

## 2023-06-17 ENCOUNTER — Inpatient Hospital Stay: Payer: Medicare Other | Admitting: Hematology and Oncology

## 2023-06-17 VITALS — BP 157/70 | HR 105 | Temp 97.9°F | Resp 17 | Wt 80.5 lb

## 2023-06-17 DIAGNOSIS — D472 Monoclonal gammopathy: Secondary | ICD-10-CM | POA: Diagnosis not present

## 2023-06-17 DIAGNOSIS — Z79899 Other long term (current) drug therapy: Secondary | ICD-10-CM | POA: Diagnosis not present

## 2023-06-17 DIAGNOSIS — F17210 Nicotine dependence, cigarettes, uncomplicated: Secondary | ICD-10-CM | POA: Diagnosis not present

## 2023-06-17 NOTE — Progress Notes (Signed)
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 Found during rheumatologist evaluation for RA.  2) 10/12/2020: establish care with Dr. Leonides Schanz. M protein 0.6, IgM kappa specificity. Kappa 49.3, Lambda 20.3, ratio 2.43.   3) 10/24/2020: Bone marrow biopsy revealed normocellular marrow with polytypic plasmacytosis (5%).   Interval History:  Abigail Lamb 72 y.o. female with medical history significant for IgM kappa monoclonal gammopathy who presents for a follow up visit. The patient's last visit was on 11/28/2022. In the interim since the last visit she has had no major changes in her health.  On exam today Abigail Lamb reports that in the last 6 months she has had her "ups and downs".  She reports that today she is not having a great day because she is having pain on the right side of her body.  She reports that her foot, knee, and arm are hurting.  She notes that she is taking Forteo and is unsure if she would like to continue the medication.  She notes that she will talk with her rheumatologist about this further.  She notes that she has been having some occasional bouts of dizziness and nausea but no vomiting or diarrhea.  She reports she also does have periodic headaches and she feels like her "teeth are shifting".  She notes that she has been craving steak and shrimp.  She is not having any fevers, chills, sweats.  She denies any bone or back pain at this time.  A full 10 point ROS is listed below.  Of note, she plans on moving to [facility] later this year.  She will need to transfer to a local oncologist at that time.  MEDICAL HISTORY:  Past Medical History:  Diagnosis Date   Celiac disease    GERD  (gastroesophageal reflux disease)    MGUS (monoclonal gammopathy of unknown significance)    per patient    Osteoporosis    per patient, diagnosed by Dr. Darral Dash in [facility]   PUD (peptic ulcer disease)    Pulmonary emphysema (HCC)    Recurrent depression (HCC)     SURGICAL HISTORY: Past Surgical History:  Procedure Laterality Date   ABCESS DRAINAGE Left    under left arm   TONSILLECTOMY  1977    SOCIAL HISTORY: Social History   Socioeconomic History   Marital status: Divorced    Spouse name: Not on file   Number of children: Not on file   Years of education: Not on file   Highest education level: 12th grade  Occupational History   Not on file  Tobacco Use   Smoking status: Every Day    Packs/day: 0.50    Years: 49.00    Additional pack years: 0.00    Total pack years: 24.50    Types: Cigarettes    Passive exposure: Current   Smokeless tobacco: Never  Vaping Use   Vaping Use: Never used  Substance and Sexual Activity   Alcohol use: Not Currently   Drug use: Never   Sexual activity: Not Currently    Partners: Male  Other Topics Concern   Not on file  Social History Narrative   Not on file   Social Determinants of Health   Financial Resource Strain: Medium Risk (05/13/2023)  Overall Financial Resource Strain (CARDIA)    Difficulty of Paying Living Expenses: Somewhat hard  Food Insecurity: Food Insecurity Present (05/13/2023)   Hunger Vital Sign    Worried About Running Out of Food in the Last Year: Sometimes true    Ran Out of Food in the Last Year: Never true  Transportation Needs: No Transportation Needs (05/13/2023)   PRAPARE - Administrator, Civil Service (Medical): No    Lack of Transportation (Non-Medical): No  Physical Activity: Unknown (05/13/2023)   Exercise Vital Sign    Days of Exercise per Week: 0 days    Minutes of Exercise per Session: Patient declined  Stress: Stress Concern Present (05/13/2023)   [employer] of  Occupational Health - Occupational Stress Questionnaire    Feeling of Stress : To some extent  Social Connections: Moderately Isolated (05/13/2023)   Social Connection and Isolation Panel [NHANES]    Frequency of Communication with Friends and Family: More than three times a week    Frequency of Social Gatherings with Friends and Family: More than three times a week    Attends Religious Services: 1 to 4 times per year    Active Member of [employer] or Organizations: No    Attends Banker Meetings: Patient declined    Marital Status: Widowed  Intimate Partner Violence: Not At Risk (03/19/2023)   Humiliation, Afraid, Rape, and Kick questionnaire    Fear of Current or Ex-Partner: No    Emotionally Abused: No    Physically Abused: No    Sexually Abused: No    FAMILY HISTORY: Family History  Problem Relation Age of Onset   CVA Mother    CAD Mother    Diabetes Mother    COPD Mother    Fibromyalgia Mother    Depression Mother    Hypertension Mother    Emphysema Mother        smoked   Heart attack Mother    Prostate cancer Father    Cancer - Prostate Father    Rheum arthritis Sister    Skin cancer Brother    Kidney Stones Brother    Hernia Brother    Depression Daughter    Healthy Son    Healthy Son     ALLERGIES:  is allergic to dust mite extract.  MEDICATIONS:  Current Outpatient Medications  Medication Sig Dispense Refill   albuterol (VENTOLIN HFA) 108 (90 Base) MCG/ACT inhaler Take 1-2 puffs every 4-6 hrs prn shortness of breath. Needs ventolin.     celecoxib (CELEBREX) 50 MG capsule Take 1 capsule (50 mg total) by mouth 2 (two) times daily. 60 capsule 1   Cholecalciferol (VITAMIN D3) 125 MCG (5000 UT) CAPS Take 5,000 Units by mouth daily.     clindamycin (CLEOCIN T) 1 % external solution as needed.      clobetasol (OLUX) 0.05 % topical foam Apply topically 2 (two) times daily.     clonazePAM (KLONOPIN) 0.5 MG tablet TAKE 1 TABLET BY MOUTH 2 TIMES DAILY AS NEEDED  FOR ANXIETY. 60 tablet 3   co-enzyme Q-10 30 MG capsule Take by mouth.     colesevelam (WELCHOL) 625 MG tablet TAKE 3 TABLETS BY MOUTH TWICE A DAY WITH MEAL 180 tablet 0   dicyclomine (BENTYL) 10 MG capsule TAKE 1 CAPSULE (10 MG TOTAL) BY MOUTH 4 TIMES A DAY BEFORE MEALS AND AT BEDTIME 120 capsule 0   dronabinol (MARINOL) 2.5 MG capsule TAKE 1 CAPSULE BY MOUTH 2 TIMES DAILY BEFORE  LUNCH AND SUPPER (Patient not taking: Reported on 05/14/2023) 180 capsule 0   DULoxetine (CYMBALTA) 30 MG capsule Take 3 capsules (90 mg total) by mouth daily. For total of 90 mg daily 270 capsule 0   famotidine (PEPCID) 40 MG tablet TAKE 1 TABLET BY MOUTH TWICE A DAY 180 tablet 1   fluocinonide-emollient (LIDEX-E) 0.05 % cream Apply 1 application topically 2 (two) times daily. 60 g 3   fluticasone (FLONASE) 50 MCG/ACT nasal spray Place 1 spray into both nostrils daily. 16 g 11   folic acid (FOLVITE) 1 MG tablet Take by mouth.     FORTEO 600 MCG/2.4ML SOPN INJECT 0.08ML ( ) UNDER THE SKIN ONCE DAILY 9.6 mL 0   hydroxychloroquine (PLAQUENIL) 200 MG tablet TAKE 1 TABLET BY MOUTH TWICE A DAY 180 tablet 0   hydrOXYzine (ATARAX) 10 MG tablet TAKE 1 TABLET BY MOUTH EVERY 8 HOURS AS NEEDED 270 tablet 0   Insulin Pen Needle 31G X 5 MM MISC Use to inject Forteo once daily. DO NOT REUSE NEEDLE. 100 each 2   loratadine (CLARITIN) 10 MG tablet Take 10 mg by mouth daily.     ondansetron (ZOFRAN) 4 MG tablet TAKE 1 TABLET BY MOUTH EVERY 8 HOURS AS NEEDED FOR NAUSEA AND VOMITING 40 tablet 2   sulfaSALAzine (AZULFIDINE) 500 MG tablet Take 2 tablets (1,000 mg total) by mouth 2 (two) times daily. 120 tablet 2   TUBERCULIN SYR 1CC/25GX5/8" 25G X 5/8" 1 ML MISC Use 1 syringe weekly for Methotrexate injection. 12 each 3   umeclidinium-vilanterol (ANORO ELLIPTA) 62.5-25 MCG/ACT AEPB Inhale 1 puff into the lungs daily. 1 each 11   varenicline (CHANTIX) 1 MG tablet TAKE 1 TABLET BY MOUTH TWICE A DAY (Patient not taking: Reported on  05/14/2023) 180 tablet 0   No current facility-administered medications for this visit.    REVIEW OF SYSTEMS:   Constitutional: ( - ) fevers, ( - )  chills , ( - ) night sweats Eyes: ( - ) blurriness of vision, ( - ) double vision, ( - ) watery eyes Ears, nose, mouth, throat, and face: ( - ) mucositis, ( - ) sore throat Respiratory: ( - ) cough, ( - ) dyspnea, ( - ) wheezes Cardiovascular: ( - ) palpitation, ( - ) chest discomfort, ( - ) lower extremity swelling Gastrointestinal:  ( - ) nausea, ( - ) heartburn, ( - ) change in bowel habits Skin: ( - ) abnormal skin rashes Lymphatics: ( - ) new lymphadenopathy, ( - ) easy bruising Neurological: ( - ) numbness, ( - ) tingling, ( - ) new weaknesses Behavioral/Psych: ( - ) mood change, ( - ) new changes  All other systems were reviewed with the patient and are negative.  PHYSICAL EXAMINATION: ECOG PERFORMANCE STATUS: 1 - Symptomatic but completely ambulatory  Vitals:   06/17/23 1026  BP: (!) 157/70  Pulse: (!) 105  Resp: 17  Temp: 97.9 F (36.6 C)  SpO2: 97%     Filed Weights   06/17/23 1026  Weight: 80 lb 8 oz (36.5 kg)      GENERAL: well appearing elderly Caucasian female in NAD  SKIN: skin color, texture, turgor are normal, no rashes or significant lesions EYES: conjunctiva are pink and non-injected, sclera clear LUNGS: clear to auscultation and percussion with normal breathing effort HEART: regular rate & rhythm and no murmurs and no lower extremity edema Musculoskeletal: no cyanosis of digits and no clubbing. Ulnar deviation of fingers bilaterally  PSYCH: alert & oriented x 3, fluent speech NEURO: no focal motor/sensory deficits  LABORATORY DATA:  I have reviewed the data as listed    Latest Ref Rng & Units 06/05/2023    1:24 PM 05/14/2023    2:51 PM 03/09/2023    3:02 PM  CBC  WBC 4.0 - 10.5 K/uL 9.5  9.9  13.3   Hemoglobin 12.0 - 15.0 g/dL 09.8  11.9  14.7   Hematocrit 36.0 - 46.0 % 41.4  43.0  39.1    Platelets 150 - 400 K/uL 264  279  232        Latest Ref Rng & Units 06/05/2023    1:24 PM 05/14/2023    2:51 PM 03/09/2023    3:02 PM  CMP  Glucose 70 - 99 mg/dL 84  88  66   BUN 8 - 23 mg/dL 19  18  20    Creatinine 0.44 - 1.00 mg/dL 8.29  5.62  1.30   Sodium 135 - 145 mmol/L 139  140  141   Potassium 3.5 - 5.1 mmol/L 4.1  4.9  4.2   Chloride 98 - 111 mmol/L 106  105  103   CO2 22 - 32 mmol/L 25  27  31    Calcium 8.9 - 10.3 mg/dL 9.5  9.4  9.7   Total Protein 6.5 - 8.1 g/dL 7.8  7.4  7.3   Total Bilirubin 0.3 - 1.2 mg/dL 0.3  0.3  0.4   Alkaline Phos 38 - 126 U/L 127     AST 15 - 41 U/L 23  23  21    ALT 0 - 44 U/L 12  13  11      Lab Results  Component Value Date   MPROTEIN 0.9 (H) 06/05/2023   MPROTEIN 1.1 (H) 11/28/2022   MPROTEIN 0.6 (H) 05/29/2022   Lab Results  Component Value Date   KPAFRELGTCHN 63.4 (H) 06/05/2023   KPAFRELGTCHN 71.9 (H) 11/28/2022   KPAFRELGTCHN 76.5 (H) 05/29/2022   LAMBDASER 24.9 06/05/2023   LAMBDASER 26.4 (H) 11/28/2022   LAMBDASER 28.1 (H) 05/29/2022   KAPLAMBRATIO 2.55 (H) 06/05/2023   KAPLAMBRATIO 2.72 (H) 11/28/2022   KAPLAMBRATIO 15.57 (H) 11/28/2022    RADIOGRAPHIC STUDIES: No results found.  ASSESSMENT & PLAN Abigail Lamb 72 y.o. female with medical history significant for IgM kappa monoclonal gammopathy who presents for a follow up visit.    After review the labs, review the records, discussion the patient the findings are most consistent with a monoclonal myopathy of undetermined significance.  She underwent bone marrow biopsy which did show a 5% plasma cell population, however this was polytypic in nature.  As such the patient does not require any treatment but only observation from this point moving forward.  The only missing piece of her work-up at this time is her metastatic survey which she requested be performed after December 29, 2020 (but was lost to follow up).  Overall she is at her baseline level of health at this time  with no other issues that need addressed.  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 No CRAB criteria met. Bone marrow biopsy on 10/24/2020 revealed normocellular marrow with polytypic plasmacytosis (5%).  --assure yearly metastatic survey and UPEP. Testing due in Dec 2024.  --assure SPEP, SFLC, CMP, CBC, and LDH at each visit --labs today show white blood cell 9.5, Hgb 13.5, MCV 91, Plt 264 --RTC q 6 months with labs 1  week before   No orders of the defined types were placed in this encounter.  All questions were answered. The patient knows to call the clinic with any problems, questions or concerns.  A total of more than 25 minutes were spent on this encounter and over half of that time was spent on counseling and coordination of care as outlined above.   Ulysees Barns, MD Department of Hematology/Oncology [facility] Phone: [phone] Pager: [phone] Email: [email or web address]  06/17/2023 2:36 PM    Literature Support:  1) Kyle RA, Durie BG, Rajkumar SV, et al. Monoclonal gammopathy of undetermined significance (MGUS) and smoldering (asymptomatic) multiple myeloma: IMWG consensus perspectives risk factors for progression and guidelines for monitoring and management. Leukemia. 2010;24(6):1121-1127. doi:10.1038/leu.2010.60   --If a patient with apparent MGUS has a serum monoclonal protein >15 g/l, IgA or IgM protein type, or an abnormal FLC ratio, a BM aspirate and biopsy should be carried out at baseline to rule out underlying PC malignancy.   2) Ronaldo Miyamoto RA, Therneau TM, Rajkumar SV, et al. Prevalence of monoclonal gammopathy of undetermined significance. N Engl J Med 2006;354:1362-1369   --MGUS was found in 3.2 percent of persons 43 years of age or older and 5.3 percent of persons 12 years of age or older.

## 2023-06-21 IMAGING — DX DG CHEST 2V
2 series · 2 of 2 positions shown · non-contrast
Comparison: 01/14/2012

CLINICAL DATA: Cough.  Dyspnea on exertion.  Weight loss.

EXAM:
CHEST - 2 VIEW

[chest pa]
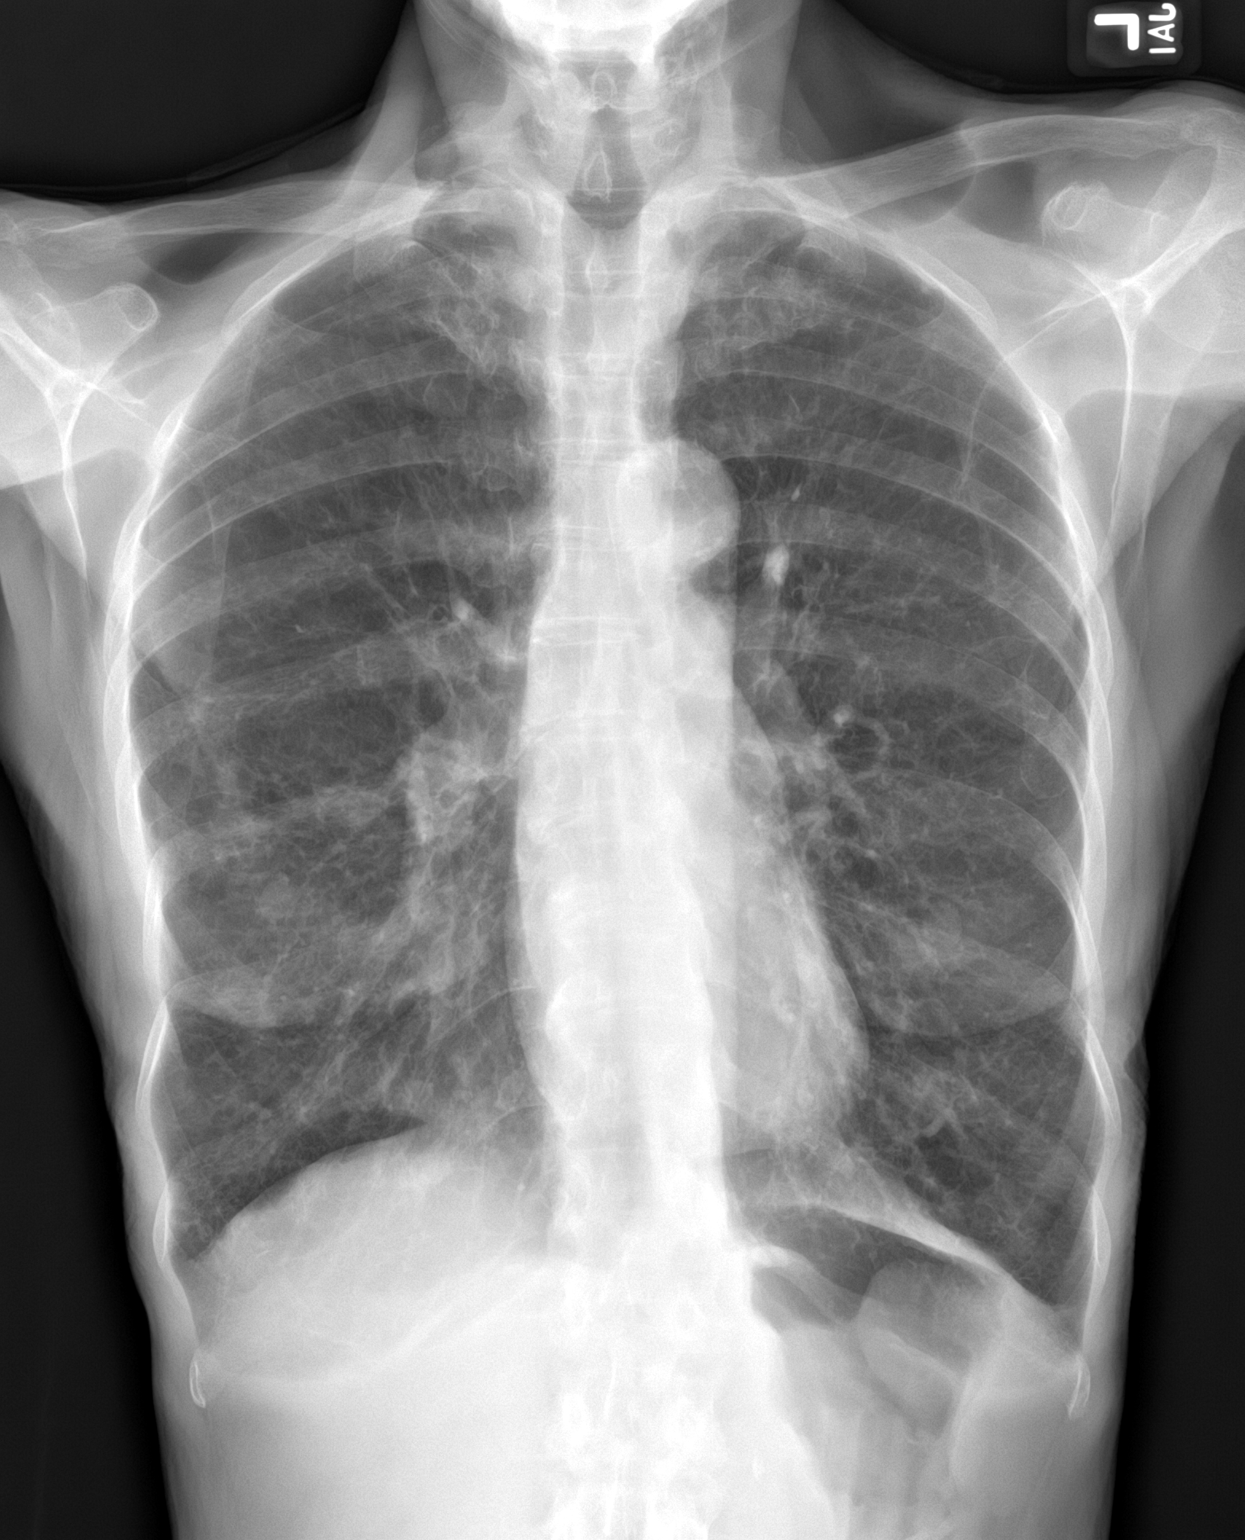

[chest lat]
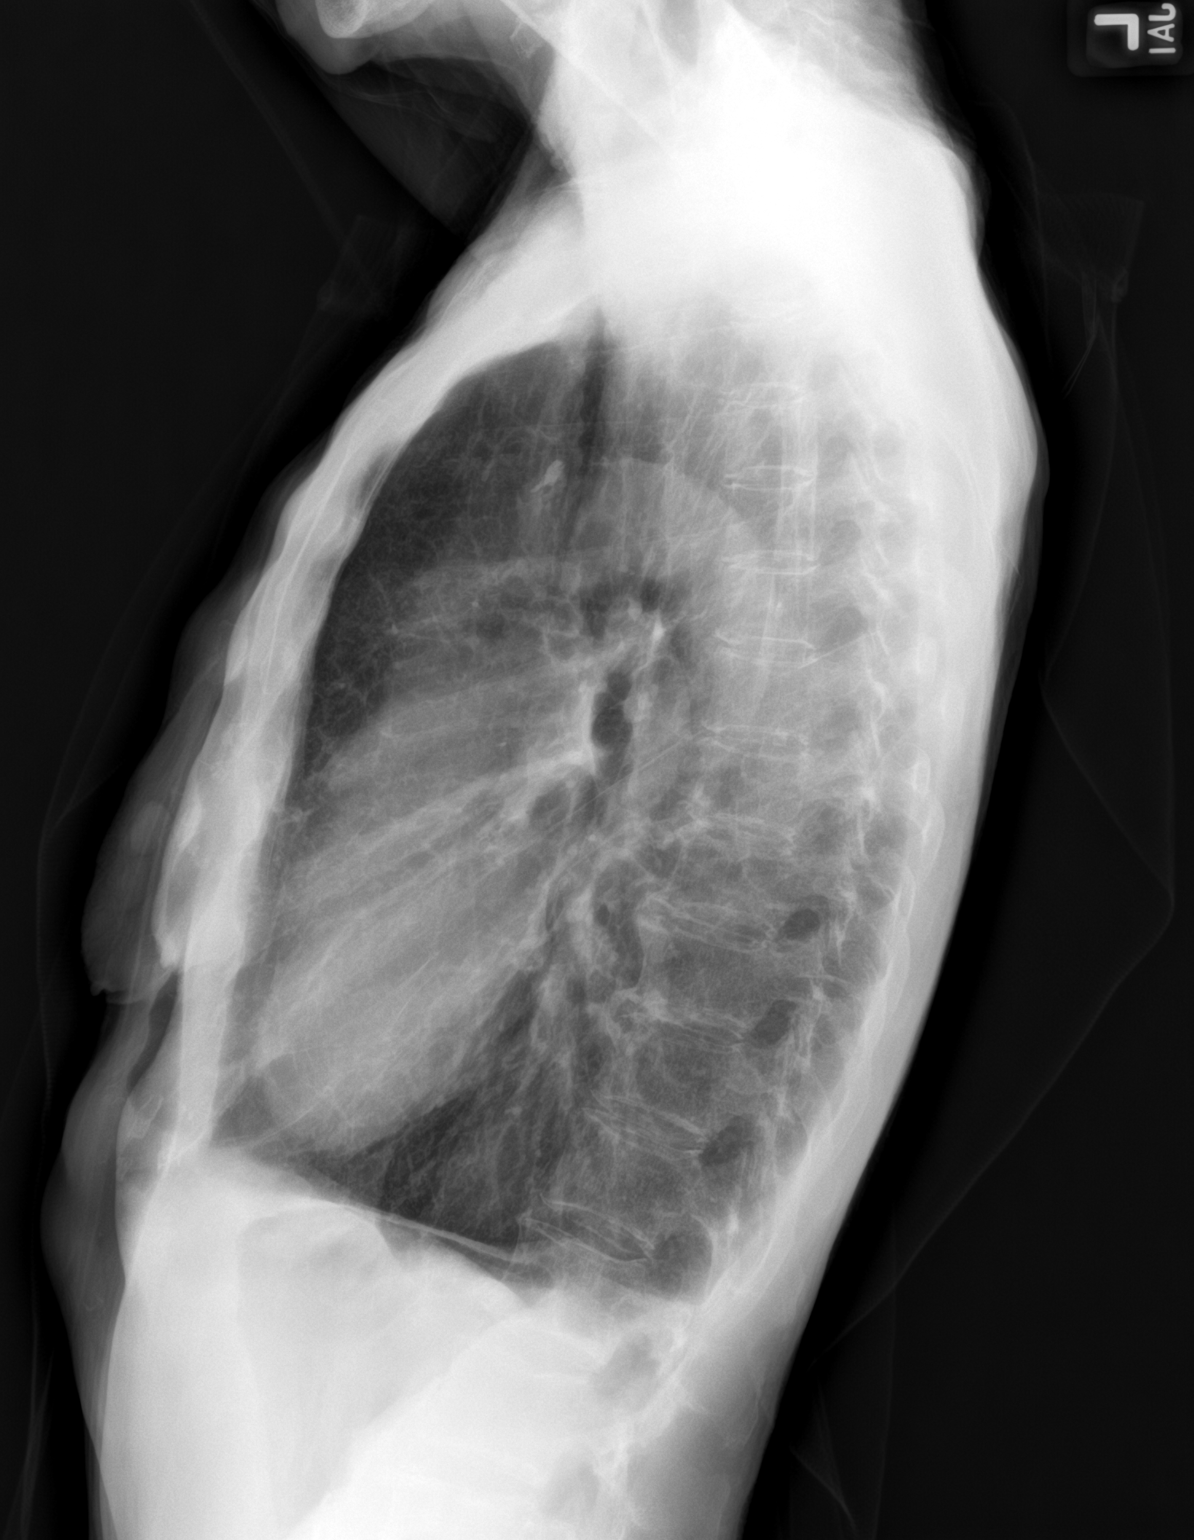

[2 of 2 positions shown; findings below may reference images not displayed]

FINDINGS: The heart size and mediastinal contours are within normal limits.
Aortic atherosclerotic calcification noted. Pulmonary emphysema is
noted. No evidence of pulmonary infiltrate or edema. No evidence of
pleural effusion. Nipple shadows are seen overlying both lower
lungs. The visualized skeletal structures are unremarkable.
IMPRESSION: Emphysema. No active cardiopulmonary disease.

## 2023-06-25 ENCOUNTER — Other Ambulatory Visit: Payer: Self-pay

## 2023-06-25 DIAGNOSIS — M810 Age-related osteoporosis without current pathological fracture: Secondary | ICD-10-CM

## 2023-06-25 MED ORDER — INSULIN PEN NEEDLE 31G X 5 MM MISC: 2 refills | Status: DC

## 2023-06-25 NOTE — Telephone Encounter (Signed)
Patient contacted the office and states she would like a refill of needles be sent to [facility] on [address].   Last Fill: 05/20/2022  Next Visit: 08/17/2023  Last Visit: 05/14/2023  Dx: Osteoporosis  Current Dose per office note on 05/14/2023: injecting Forteo at bedtime   Okay to refill Forteo Needles?

## 2023-07-17 ENCOUNTER — Other Ambulatory Visit: Payer: Self-pay | Admitting: Rheumatology

## 2023-07-17 DIAGNOSIS — M0579 Rheumatoid arthritis with rheumatoid factor of multiple sites without organ or systems involvement: Secondary | ICD-10-CM

## 2023-07-17 NOTE — Telephone Encounter (Signed)
Last Fill: 04/15/2023  Eye exam: 08/14/2022 WNL    Labs: 06/05/2023 Alk. Phos. 127  Next Visit: 08/17/2023  Last Visit: 05/14/2023  DX: Rheumatoid arthritis with rheumatoid factor of multiple sites without organ or systems involvement   Current Dose per office note 05/14/2023: Plaquenil 200 mg 1 tablet by mouth twice daily   Okay to refill Plaquenil?

## 2023-07-19 ENCOUNTER — Other Ambulatory Visit: Payer: Self-pay | Admitting: Rheumatology

## 2023-07-19 DIAGNOSIS — M810 Age-related osteoporosis without current pathological fracture: Secondary | ICD-10-CM

## 2023-07-20 ENCOUNTER — Other Ambulatory Visit: Payer: Self-pay | Admitting: Internal Medicine

## 2023-07-20 DIAGNOSIS — M0579 Rheumatoid arthritis with rheumatoid factor of multiple sites without organ or systems involvement: Secondary | ICD-10-CM

## 2023-07-20 NOTE — Telephone Encounter (Signed)
Last Fill: 04/01/2023  Labs: 06/05/2023 Alk. Phos 127  Next Visit: 08/17/2023  Last Visit: 05/14/2023  DX: Age-related osteoporosis without current pathological fracture   Current Dose per office note 05/14/2023: Forteo injections started 05/29/22.   Okay to refill Forteo?

## 2023-07-21 ENCOUNTER — Other Ambulatory Visit: Payer: Self-pay | Admitting: Physician Assistant

## 2023-07-21 NOTE — Telephone Encounter (Signed)
Last Fill: 03/16/2023  Labs: 06/05/2023 Alk. Phos 127   Next Visit: 08/17/2023  Last Visit: 05/14/2023  DX: Rheumatoid arthritis with rheumatoid factor of multiple sites without organ or systems involvement   Current Dose per office note 05/14/2023: sulfasalazine 500 mg 2 tablets in the morning and 1 tablet in the evening.   Okay to refill Sulfasalazine?

## 2023-08-02 IMAGING — CT CT CHEST W/O CM
2 of 4 series · 15 of 36 positions shown, 18 images · non-contrast
Comparison: 02/08/2020

CLINICAL DATA: Abnormal xray - lung nodule, < 1 cm, mod-high risk.
Unintentional weight loss, chronic cough, continued smoking.

EXAM:
CT CHEST WITHOUT CONTRAST
TECHNIQUE: Multidetector CT imaging of the chest was performed following the
standard protocol without IV contrast.

[Series 2: thorax · axial · 0.54mm/px · z∈[-305,-41]mm · 12 of 157 slices shown, 15 images]
[im 13/157  mediastinal]
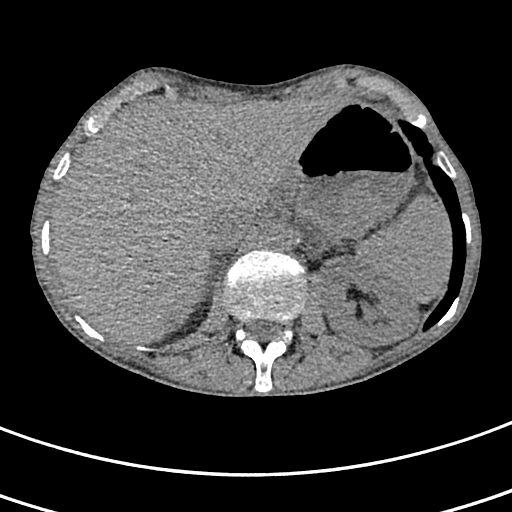
[im 13/157  lung]
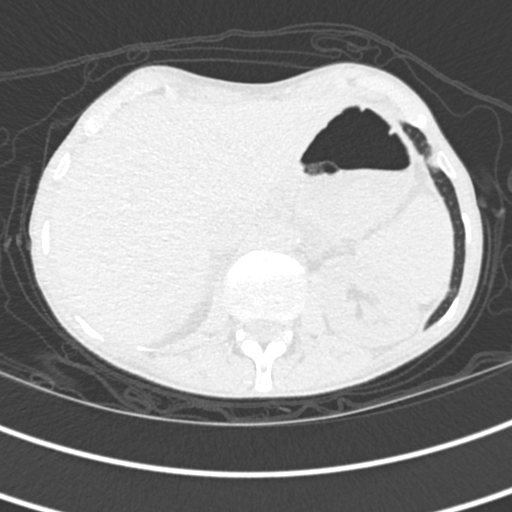
[im 25/157  lung]
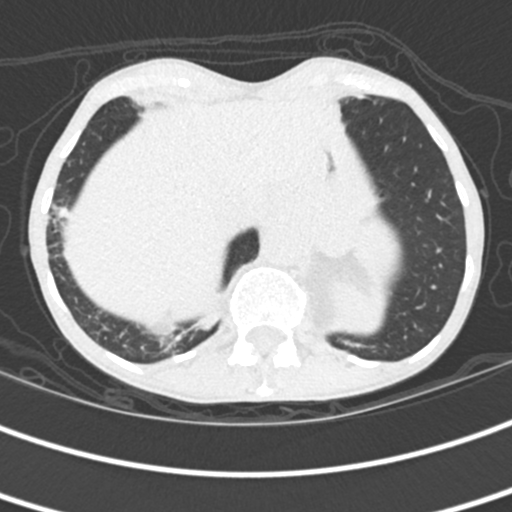
[im 37/157  lung]
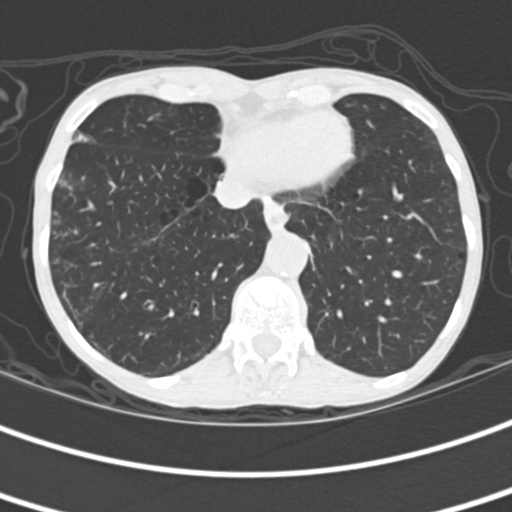
[im 49/157  lung]
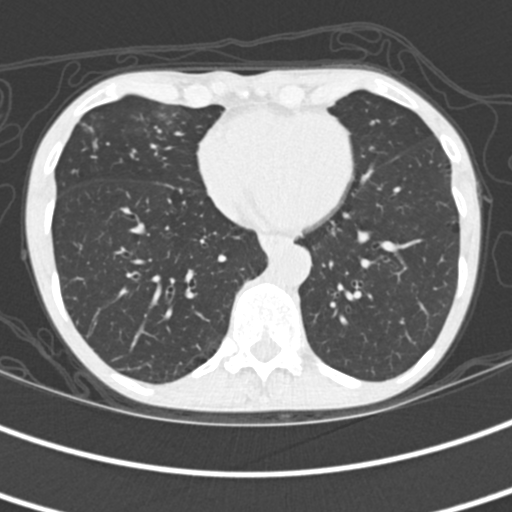
[im 61/157  mediastinal]
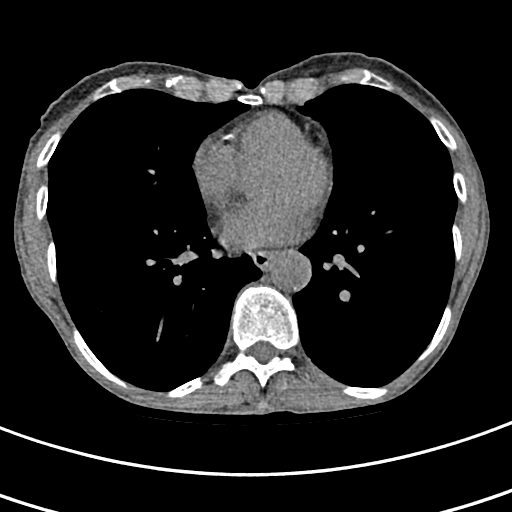
[im 61/157  lung]
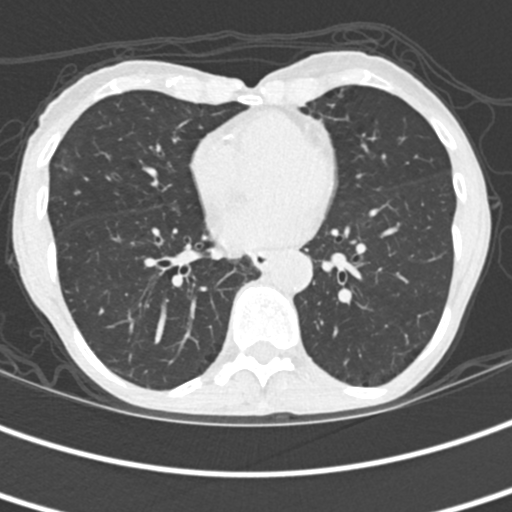
[im 73/157  lung]
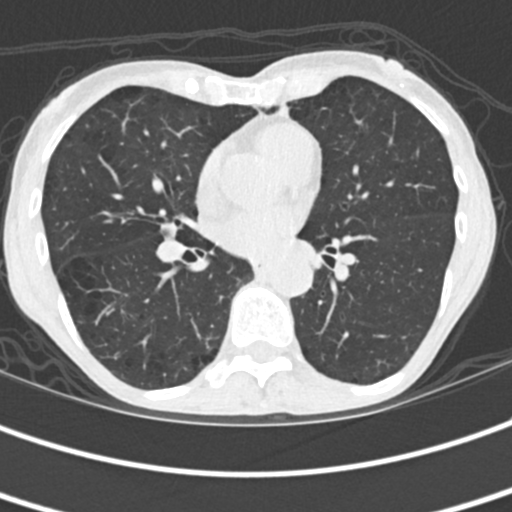
[im 85/157  lung]
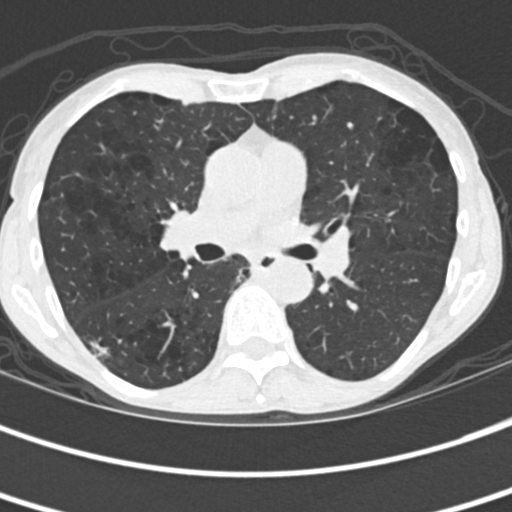
[im 97/157  lung]
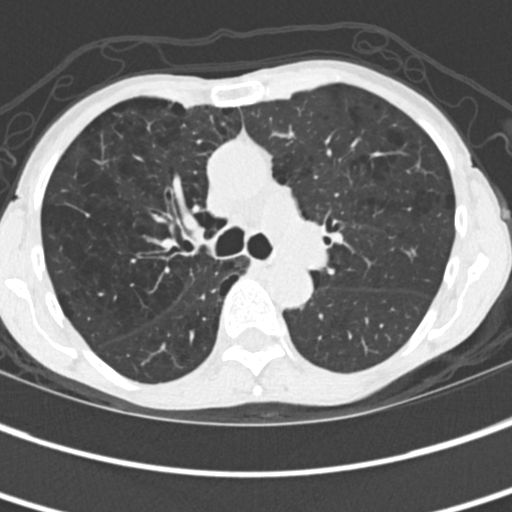
[im 109/157  mediastinal]
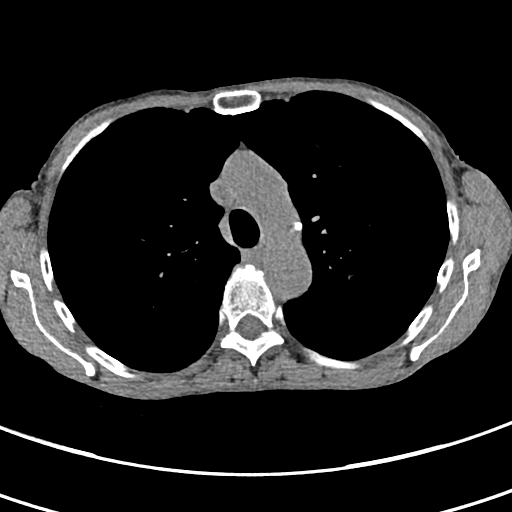
[im 109/157  lung]
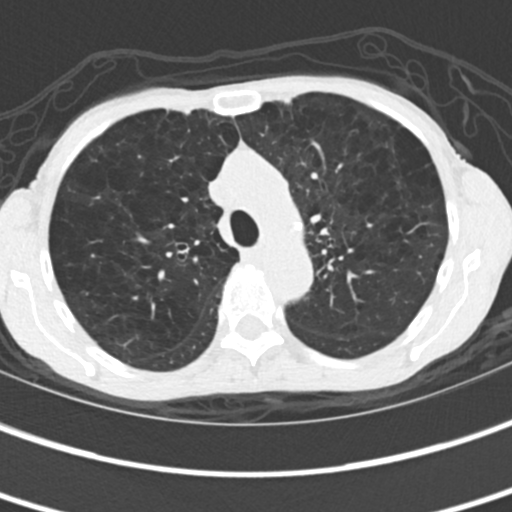
[im 121/157  lung]
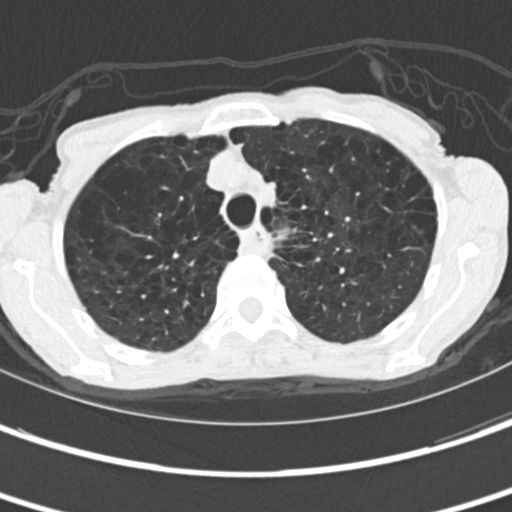
[im 133/157  lung]
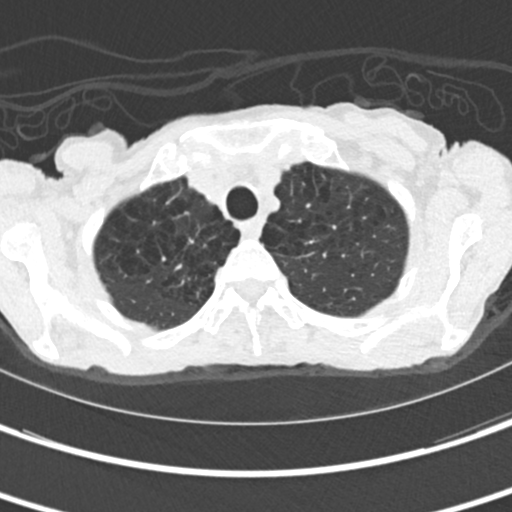
[im 145/157  lung]
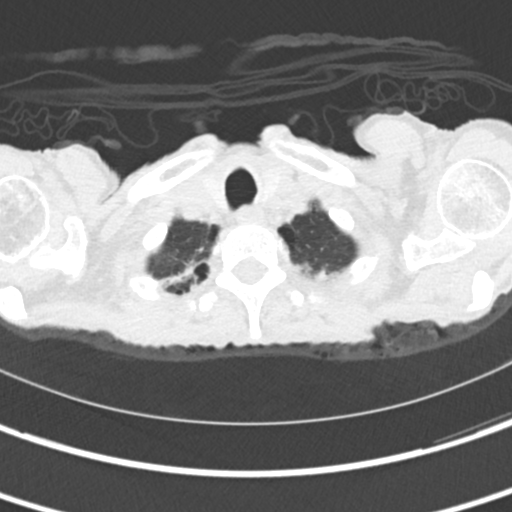

[Series 6: coronal · coronal · 0.61mm/px · 3 of 117 slices shown]
[im 24/117  lung]
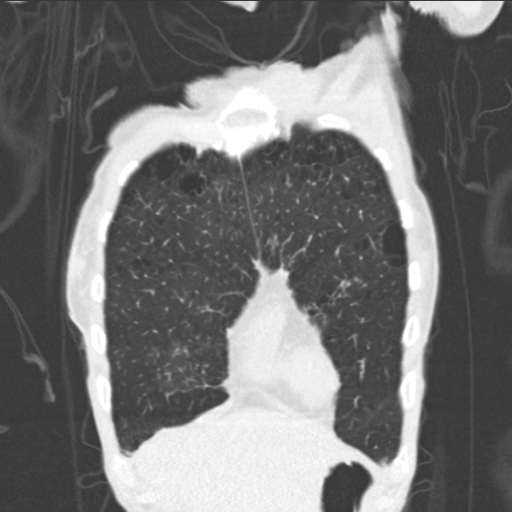
[im 47/117  lung]
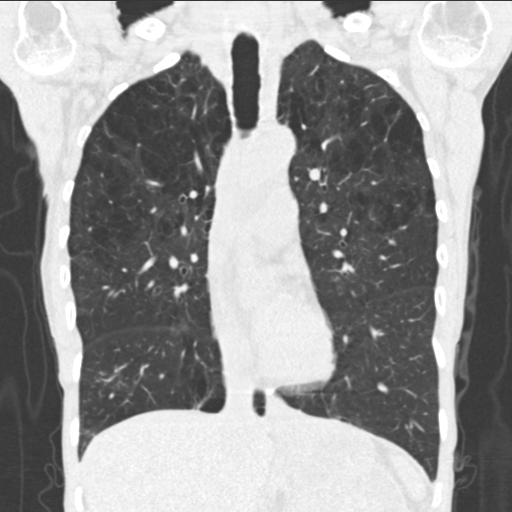
[im 70/117  lung]
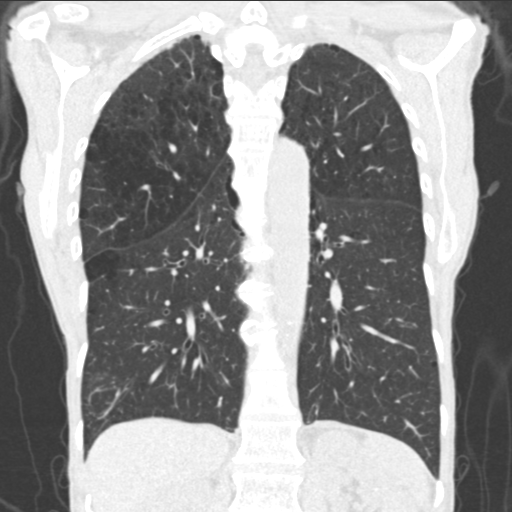

[15 of 36 positions shown; findings below may reference images not displayed]

FINDINGS: Cardiovascular: Mild coronary artery calcification. Global cardiac
size within normal limits. No pericardial effusion. Central
pulmonary arteries are of normal caliber. Mild atherosclerotic
calcification within the thoracic aorta. No aortic aneurysm.

Mediastinum/Nodes: No enlarged mediastinal or axillary lymph nodes.
Thyroid gland, trachea, and esophagus demonstrate no significant
findings.

Lungs/Pleura: Severe emphysema. Scattered areas of peripheral airway
impaction and peribronchial nodularity within the right middle lobe
and right lower lobe may be infectious or inflammatory in nature. No
confluent pulmonary infiltrate. 4 mm noncalcified pulmonary nodule
within the right lower lobe, axial image # 75/5 is stable since
prior examination. Mean 7 mm nodular opacity within the right
costophrenic angle at axial image # [DATE] be inflammatory in
nature but is new from prior examination and is indeterminate. No
pneumothorax or pleural effusion. No central obstructing lesion.

Upper Abdomen: 4 mm nonobstructing calculus within the visualized
left kidney.

Musculoskeletal: No acute bone abnormality. No lytic or blastic bone
lesion.
IMPRESSION: Severe emphysema.

7 mm possibly inflammatory but indeterminate nodule within the right
lower lobe. Non-contrast chest CT at 6-12 months is recommended. If
the nodule is stable at time of repeat CT, then future CT at 18-24
months (from today's scan) is considered optional for low-risk
patients, but is recommended for high-risk patients. This
recommendation follows the consensus statement: Guidelines for
Management of Incidental Pulmonary Nodules Detected on CT Images:

Stable 4 mm nodule within the right lower lobe, likely benign given
its stability over time.

Mild coronary artery calcification

At least mild left nonobstructing nephrolithiasis

Aortic Atherosclerosis (86EZI-NDX.X) and Emphysema (86EZI-JFC.4).

## 2023-08-03 NOTE — Progress Notes (Deleted)
Office Visit Note  Patient: Abigail Lamb             Date of Birth: 31-Oct-1951           MRN: [id number]             PCP: Etta Grandchild, MD Referring: Etta Grandchild, MD Visit Date: 08/17/2023 Occupation: @GUAROCC @  Subjective:    History of Present Illness: Zyonna Stankovic is a 72 y.o. female with history of seropositive rheumatoid arthritis and osteoarthritis.  Patient remains on Plaquenil 200 mg 1 tablet by mouth twice daily and sulfasalazine 500 mg 2 tablets in the morning and 1 tablet in the evening.   CBC and CMP updated on 06/05/23. Orders for CBC and CMP released today.  PLQ Eye Exam: 08/14/2022 WNL @ [facility] Follow up 1 year.   Activities of Daily Living:  Patient reports morning stiffness for *** {minute/hour:19697}.   Patient {ACTIONS;DENIES/REPORTS:21021675::"Denies"} nocturnal pain.  Difficulty dressing/grooming: {ACTIONS;DENIES/REPORTS:21021675::"Denies"} Difficulty climbing stairs: {ACTIONS;DENIES/REPORTS:21021675::"Denies"} Difficulty getting out of chair: {ACTIONS;DENIES/REPORTS:21021675::"Denies"} Difficulty using hands for taps, buttons, cutlery, and/or writing: {ACTIONS;DENIES/REPORTS:21021675::"Denies"}  No Rheumatology ROS completed.   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   Visit for screening mammogram 08/02/2020   Rheumatoid arthritis involving multiple sites with  positive rheumatoid factor (HCC) 08/02/2020   Hyperlipidemia LDL goal <130 08/02/2020   Tobacco abuse 08/02/2020   Dupuytren's contracture of right hand 08/02/2020   Pulmonary emphysema (HCC)    Stage 2 moderate COPD by GOLD classification (HCC) 01/30/2020   Neurodermatitis 03/28/2019   Primary insomnia 03/28/2019   Recurrent depression (HCC) 03/28/2019    Past Medical History:  Diagnosis Date   Celiac disease    GERD (gastroesophageal reflux disease)    MGUS (monoclonal gammopathy of unknown significance)    per patient    Osteoporosis    per patient, diagnosed by Dr. Darral Dash in [facility]   PUD (peptic ulcer disease)    Pulmonary emphysema (HCC)    Recurrent depression (HCC)     Family History  Problem Relation Age of Onset   CVA Mother    CAD Mother    Diabetes Mother    COPD Mother    Fibromyalgia Mother    Depression Mother    Hypertension Mother    Emphysema Mother        smoked   Heart attack Mother    Prostate cancer Father    Cancer - Prostate Father    Rheum arthritis Sister    Skin cancer Brother    Kidney Stones Brother    Hernia Brother    Depression Daughter    Healthy Son    Healthy Son    Past Surgical History:  Procedure Laterality Date   ABCESS DRAINAGE Left    under left arm   TONSILLECTOMY  1977   Social History   Social History Narrative   Not on file   Immunization History  Administered Date(s) Administered   Fluad Quad(high Dose 65+) 11/08/2021, 10/27/2022  Influenza, High Dose Seasonal PF 12/30/2019   Influenza-Unspecified 01/04/2018, 12/15/2018, 09/11/2020   PNEUMOCOCCAL CONJUGATE-20 11/08/2021   Pneumococcal Polysaccharide-23 08/02/2020   Tdap 11/05/2020   Zoster Recombinant(Shingrix) 03/20/2021, 11/27/2021     Objective: Vital Signs: There were no vitals taken for this visit.   Physical Exam Vitals and nursing note reviewed.  Constitutional:      Appearance: She is well-developed.  HENT:     Head: Normocephalic and  atraumatic.  Eyes:     Conjunctiva/sclera: Conjunctivae normal.  Cardiovascular:     Rate and Rhythm: Normal rate and regular rhythm.     Heart sounds: Normal heart sounds.  Pulmonary:     Effort: Pulmonary effort is normal.     Breath sounds: Normal breath sounds.  Abdominal:     General: Bowel sounds are normal.     Palpations: Abdomen is soft.  Musculoskeletal:     Cervical back: Normal range of motion.  Lymphadenopathy:     Cervical: No cervical adenopathy.  Skin:    General: Skin is warm and dry.     Capillary Refill: Capillary refill takes less than 2 seconds.  Neurological:     Mental Status: She is alert and oriented to person, place, and time.  Psychiatric:        Behavior: Behavior normal.      Musculoskeletal Exam: ***  CDAI Exam: CDAI Score: -- Patient Global: --; Provider Global: -- Swollen: --; Tender: -- Joint Exam 08/17/2023   No joint exam has been documented for this visit   There is currently no information documented on the homunculus. Go to the Rheumatology activity and complete the homunculus joint exam.  Investigation: No additional findings.  Imaging: No results found.  Recent Labs: Lab Results  Component Value Date   WBC 9.5 06/05/2023   HGB 13.5 06/05/2023   PLT 264 06/05/2023   NA 139 06/05/2023   K 4.1 06/05/2023   CL 106 06/05/2023   CO2 25 06/05/2023   GLUCOSE 84 06/05/2023   BUN 19 06/05/2023   CREATININE 0.65 06/05/2023   BILITOT 0.3 06/05/2023   ALKPHOS 127 (H) 06/05/2023   AST 23 06/05/2023   ALT 12 06/05/2023   PROT 7.8 06/05/2023   ALBUMIN 3.8 06/05/2023   CALCIUM 9.5 06/05/2023   GFRAA 107 10/26/2020   QFTBGOLDPLUS NEGATIVE 09/17/2020    Speciality Comments: PLQ Eye Exam: 08/14/2022 WNL @ [facility] Follow up 1 year.   Humira and Orencia were discontinued in the past due to frequent infections per patient.  Forteo started 05/29/22  Procedures:  No procedures performed Allergies: Dust mite extract    Assessment / Plan:     Visit Diagnoses: Rheumatoid arthritis with rheumatoid factor of multiple sites without organ or systems involvement (HCC)  Rheumatoid nodulosis (HCC)  High risk medication use  Primary osteoarthritis of both knees  Monoclonal gammopathy  Age-related osteoporosis without current pathological fracture  Vitamin D deficiency  Prediabetes  Stage 2 moderate COPD by GOLD classification (HCC)  Lung nodules  Kidney stone  Recurrent depression (HCC)  Tobacco abuse  Hyperlipidemia LDL goal <130  Panlobular emphysema (HCC)  Primary insomnia  Orders: No orders of the defined types were placed in this encounter.  No orders of the defined types were placed in this encounter.   Face-to-face time spent with patient was *** minutes. Greater than 50% of time was spent in counseling and coordination of care.  Follow-Up Instructions: No follow-ups on file.   Gearldine Bienenstock, PA-C  Note - This record has been created using [employer].  Chart creation errors have been sought, but may not always  have been located. Such creation errors do not reflect on  the standard of medical care.

## 2023-08-17 ENCOUNTER — Ambulatory Visit: Payer: Medicare Other | Admitting: Physician Assistant

## 2023-08-17 DIAGNOSIS — D472 Monoclonal gammopathy: Secondary | ICD-10-CM

## 2023-08-17 DIAGNOSIS — R918 Other nonspecific abnormal finding of lung field: Secondary | ICD-10-CM

## 2023-08-17 DIAGNOSIS — M810 Age-related osteoporosis without current pathological fracture: Secondary | ICD-10-CM

## 2023-08-17 DIAGNOSIS — M0579 Rheumatoid arthritis with rheumatoid factor of multiple sites without organ or systems involvement: Secondary | ICD-10-CM

## 2023-08-17 DIAGNOSIS — E559 Vitamin D deficiency, unspecified: Secondary | ICD-10-CM

## 2023-08-17 DIAGNOSIS — Z79899 Other long term (current) drug therapy: Secondary | ICD-10-CM

## 2023-08-17 DIAGNOSIS — R7303 Prediabetes: Secondary | ICD-10-CM

## 2023-08-17 DIAGNOSIS — F5101 Primary insomnia: Secondary | ICD-10-CM

## 2023-08-17 DIAGNOSIS — F339 Major depressive disorder, recurrent, unspecified: Secondary | ICD-10-CM

## 2023-08-17 DIAGNOSIS — E785 Hyperlipidemia, unspecified: Secondary | ICD-10-CM

## 2023-08-17 DIAGNOSIS — M0630 Rheumatoid nodule, unspecified site: Secondary | ICD-10-CM

## 2023-08-17 DIAGNOSIS — M170 Bilateral primary osteoarthritis of knee: Secondary | ICD-10-CM

## 2023-08-17 DIAGNOSIS — J431 Panlobular emphysema: Secondary | ICD-10-CM

## 2023-08-17 DIAGNOSIS — Z720 Tobacco use: Secondary | ICD-10-CM

## 2023-08-17 DIAGNOSIS — N200 Calculus of kidney: Secondary | ICD-10-CM

## 2023-08-17 DIAGNOSIS — J449 Chronic obstructive pulmonary disease, unspecified: Secondary | ICD-10-CM

## 2023-08-17 NOTE — Progress Notes (Deleted)
Office Visit Note  Patient: Abigail Lamb             Date of Birth: 08-May-1951           MRN: [id number]             PCP: Etta Grandchild, MD Referring: Etta Grandchild, MD Visit Date: 08/24/2023 Occupation: @GUAROCC @  Subjective:    History of Present Illness: Marilynn Merola is a 72 y.o. female with history of seropositive rheumatoid arthritis and osteoarthritis.  Patient remains on Plaquenil 200 mg 1 tablet by mouth twice daily and sulfasalazine 500 mg 2 tablets in the morning and 1 tablet in the evening.   CBC and CMP updated on 06/05/23. Orders for CBC and CMP released today.  PLQ Eye Exam: 08/14/2022 WNL @ [facility] Follow up 1 year.   Activities of Daily Living:  Patient reports morning stiffness for *** {minute/hour:19697}.   Patient {ACTIONS;DENIES/REPORTS:21021675::"Denies"} nocturnal pain.  Difficulty dressing/grooming: {ACTIONS;DENIES/REPORTS:21021675::"Denies"} Difficulty climbing stairs: {ACTIONS;DENIES/REPORTS:21021675::"Denies"} Difficulty getting out of chair: {ACTIONS;DENIES/REPORTS:21021675::"Denies"} Difficulty using hands for taps, buttons, cutlery, and/or writing: {ACTIONS;DENIES/REPORTS:21021675::"Denies"}  No Rheumatology ROS completed.   PMFS History:  Patient Active Problem List   Diagnosis Date Noted  . Idiopathic hypotension 05/17/2023  . 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 Cough productive of yellow sputum 05/13/2023  . Lung nodule 06/10/2022  . Age-related osteoporosis without current pathological fracture 03/11/2022  . 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 Gastroparesis 11/07/2021  . Irritable bowel syndrome with both constipation and diarrhea 11/07/2021  . Encounter for general adult medical examination with abnormal findings 11/07/2021  . 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 Visit for screening mammogram 08/02/2020  . Rheumatoid arthritis involving multiple  sites with positive rheumatoid factor (HCC) 08/02/2020  . Hyperlipidemia LDL goal <130 08/02/2020  . Tobacco abuse 08/02/2020  . Dupuytren's contracture of right hand 08/02/2020  . Pulmonary emphysema (HCC)   . Stage 2 moderate COPD by GOLD classification (HCC) 01/30/2020  . 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 Primary insomnia 03/28/2019  . Recurrent depression (HCC) 03/28/2019    Past Medical History:  Diagnosis Date  . Celiac disease   . GERD (gastroesophageal reflux disease)   . MGUS (monoclonal gammopathy of unknown significance)    per patient   . Osteoporosis    per patient, diagnosed by Dr. Darral Dash in [facility]  . PUD (peptic ulcer disease)   . Pulmonary emphysema (HCC)   . Recurrent depression (HCC)     Family History  Problem Relation Age of Onset  . CVA Mother   . CAD Mother   . Diabetes Mother   . COPD Mother   . Fibromyalgia Mother   . Depression Mother   . Hypertension Mother   . Emphysema Mother        smoked  . Heart attack Mother   . Prostate cancer Father   . Cancer - Prostate Father   . Rheum arthritis Sister   . Skin cancer Brother   . Kidney Stones Brother   . Hernia Brother   . Depression Daughter   . Healthy Son   . Healthy Son    Past Surgical History:  Procedure Laterality Date  . ABCESS DRAINAGE Left    under left arm  . TONSILLECTOMY  1977   Social History   Social History Narrative  . Not on file   Immunization History  Administered Date(s) Administered  . Fluad Quad(high Dose 65+) 11/08/2021, 10/27/2022  .  Influenza, High Dose Seasonal PF 12/30/2019  . Influenza-Unspecified 01/04/2018, 12/15/2018, 09/11/2020  . PNEUMOCOCCAL CONJUGATE-20 11/08/2021  . Pneumococcal Polysaccharide-23 08/02/2020  . Tdap 11/05/2020  . Zoster Recombinant(Shingrix) 03/20/2021, 11/27/2021     Objective: Vital Signs: There were no vitals taken for this visit.   Physical Exam Vitals and nursing note reviewed.  Constitutional:      Appearance: She is  well-developed.  HENT:     Head: Normocephalic and atraumatic.  Eyes:     Conjunctiva/sclera: Conjunctivae normal.  Cardiovascular:     Rate and Rhythm: Normal rate and regular rhythm.     Heart sounds: Normal heart sounds.  Pulmonary:     Effort: Pulmonary effort is normal.     Breath sounds: Normal breath sounds.  Abdominal:     General: Bowel sounds are normal.     Palpations: Abdomen is soft.  Musculoskeletal:     Cervical back: Normal range of motion.  Lymphadenopathy:     Cervical: No cervical adenopathy.  Skin:    General: Skin is warm and dry.     Capillary Refill: Capillary refill takes less than 2 seconds.  Neurological:     Mental Status: She is alert and oriented to person, place, and time.  Psychiatric:        Behavior: Behavior normal.     Musculoskeletal Exam: ***  CDAI Exam: CDAI Score: -- Patient Global: --; Provider Global: -- Swollen: --; Tender: -- Joint Exam 08/24/2023   No joint exam has been documented for this visit   There is currently no information documented on the homunculus. Go to the Rheumatology activity and complete the homunculus joint exam.  Investigation: No additional findings.  Imaging: No results found.  Recent Labs: Lab Results  Component Value Date   WBC 9.5 06/05/2023   HGB 13.5 06/05/2023   PLT 264 06/05/2023   NA 139 06/05/2023   K 4.1 06/05/2023   CL 106 06/05/2023   CO2 25 06/05/2023   GLUCOSE 84 06/05/2023   BUN 19 06/05/2023   CREATININE 0.65 06/05/2023   BILITOT 0.3 06/05/2023   ALKPHOS 127 (H) 06/05/2023   AST 23 06/05/2023   ALT 12 06/05/2023   PROT 7.8 06/05/2023   ALBUMIN 3.8 06/05/2023   CALCIUM 9.5 06/05/2023   GFRAA 107 10/26/2020   QFTBGOLDPLUS NEGATIVE 09/17/2020    Speciality Comments: PLQ Eye Exam: 08/14/2022 WNL @ [facility] Follow up 1 year.   Humira and Orencia were discontinued in the past due to frequent infections per patient.  Forteo started 05/29/22  Procedures:  No  procedures performed Allergies: Dust mite extract   Assessment / Plan:     Visit Diagnoses: Rheumatoid arthritis with rheumatoid factor of multiple sites without organ or systems involvement (HCC)  Rheumatoid nodulosis (HCC)  High risk medication use  Primary osteoarthritis of both knees  Monoclonal gammopathy  Age-related osteoporosis without current pathological fracture  Vitamin D deficiency  Prediabetes  Stage 2 moderate COPD by GOLD classification (HCC)  Lung nodules  Kidney stone  Recurrent depression (HCC)  Tobacco abuse  Hyperlipidemia LDL goal <130  Panlobular emphysema (HCC)  Primary insomnia  Orders: No orders of the defined types were placed in this encounter.  No orders of the defined types were placed in this encounter.   Face-to-face time spent with patient was *** minutes. Greater than 50% of time was spent in counseling and coordination of care.  Follow-Up Instructions: No follow-ups on file.   Gearldine Bienenstock, PA-C  Note - This record has been created using [employer].  Chart creation errors have been sought, but may not always  have been located. Such creation errors do not reflect on  the standard of medical care.

## 2023-08-19 ENCOUNTER — Other Ambulatory Visit: Payer: Self-pay | Admitting: Internal Medicine

## 2023-08-19 DIAGNOSIS — K582 Mixed irritable bowel syndrome: Secondary | ICD-10-CM

## 2023-08-24 ENCOUNTER — Ambulatory Visit: Payer: Medicare Other | Admitting: Physician Assistant

## 2023-08-24 DIAGNOSIS — M170 Bilateral primary osteoarthritis of knee: Secondary | ICD-10-CM

## 2023-08-24 DIAGNOSIS — E559 Vitamin D deficiency, unspecified: Secondary | ICD-10-CM

## 2023-08-24 DIAGNOSIS — R918 Other nonspecific abnormal finding of lung field: Secondary | ICD-10-CM

## 2023-08-24 DIAGNOSIS — E785 Hyperlipidemia, unspecified: Secondary | ICD-10-CM

## 2023-08-24 DIAGNOSIS — Z79899 Other long term (current) drug therapy: Secondary | ICD-10-CM

## 2023-08-24 DIAGNOSIS — D472 Monoclonal gammopathy: Secondary | ICD-10-CM

## 2023-08-24 DIAGNOSIS — M810 Age-related osteoporosis without current pathological fracture: Secondary | ICD-10-CM

## 2023-08-24 DIAGNOSIS — M0630 Rheumatoid nodule, unspecified site: Secondary | ICD-10-CM

## 2023-08-24 DIAGNOSIS — Z720 Tobacco use: Secondary | ICD-10-CM

## 2023-08-24 DIAGNOSIS — F5101 Primary insomnia: Secondary | ICD-10-CM

## 2023-08-24 DIAGNOSIS — F339 Major depressive disorder, recurrent, unspecified: Secondary | ICD-10-CM

## 2023-08-24 DIAGNOSIS — N200 Calculus of kidney: Secondary | ICD-10-CM

## 2023-08-24 DIAGNOSIS — J431 Panlobular emphysema: Secondary | ICD-10-CM

## 2023-08-24 DIAGNOSIS — J449 Chronic obstructive pulmonary disease, unspecified: Secondary | ICD-10-CM

## 2023-08-24 DIAGNOSIS — R7303 Prediabetes: Secondary | ICD-10-CM

## 2023-08-24 DIAGNOSIS — M0579 Rheumatoid arthritis with rheumatoid factor of multiple sites without organ or systems involvement: Secondary | ICD-10-CM

## 2023-08-26 NOTE — Progress Notes (Unsigned)
Office Visit Note  Patient: Abigail Lamb             Date of Birth: 05/11/1951           MRN: [id number]             PCP: Etta Grandchild, MD Referring: Etta Grandchild, MD Visit Date: 08/27/2023 Occupation: @GUAROCC @  Subjective:    History of Present Illness: Bernice Genetti is a 72 y.o. female with history of rheumatoid arthritis.  Patient is taking plaquenil 200 mg 1 tablet by mouth twice daily and sulfasalazine 500 mg 2 tablets by mouth twice daily.  She is tolerating combination therapy without any side effects.  Patient continues to have chronic pain and stiffness involving both hands.  Her hand pain is exacerbated by repetitive or overuse activities.  She has been taking Celebrex for pain relief and if severe she also takes Tylenol as needed.  Patient plans on starting to walk more since she has access to sidewalks.  Patient states at times she feels unsteady on her feet but does not have a walker or cane to assist her.  She denies any fractures.  Patient continues to inject Forteo on a nightly basis.  She experiences some dizziness in the night and occasional nausea.  She is taking vitamin D supplement as recommended.   Activities of Daily Living:  Patient reports morning stiffness for 2-3 hours.   Patient Reports nocturnal pain.  Difficulty dressing/grooming: Reports Difficulty climbing stairs: Denies Difficulty getting out of chair: Denies Difficulty using hands for taps, buttons, cutlery, and/or writing: Reports  Review of Systems  Constitutional:  Positive for fatigue.  HENT:  Positive for mouth dryness. Negative for mouth sores.   Eyes:  Positive for dryness.  Respiratory:  Positive for shortness of breath. Negative for cough and wheezing.   Cardiovascular:  Positive for chest pain, palpitations and swelling in legs/feet.  Gastrointestinal:  Negative for blood in stool, constipation and diarrhea.  Endocrine: Positive for increased urination.  Genitourinary:  Positive  for involuntary urination.  Musculoskeletal:  Positive for joint pain, gait problem, joint pain, myalgias, muscle weakness, morning stiffness, muscle tenderness and myalgias. Negative for joint swelling.  Skin:  Positive for rash and sensitivity to sunlight. Negative for color change and hair loss.  Allergic/Immunologic: Positive for susceptible to infections.  Neurological:  Positive for dizziness. Negative for headaches.  Hematological:  Negative for swollen glands.  Psychiatric/Behavioral:  Positive for depressed mood. Negative for sleep disturbance. The patient is nervous/anxious.     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   Visit for screening mammogram 08/02/2020   Rheumatoid arthritis involving multiple sites with positive rheumatoid factor (HCC) 08/02/2020   Hyperlipidemia LDL goal <130 08/02/2020   Tobacco abuse 08/02/2020   Dupuytren's contracture of right hand 08/02/2020   Pulmonary emphysema (HCC)    Stage 2 moderate COPD by GOLD classification (HCC) 01/30/2020   Neurodermatitis 03/28/2019   Primary insomnia 03/28/2019   Recurrent depression (HCC) 03/28/2019  Past Medical History:  Diagnosis Date   Celiac disease    GERD (gastroesophageal reflux disease)    MGUS (monoclonal gammopathy of unknown significance)    per patient    Osteoporosis    per patient, diagnosed by Dr. Darral Dash in [facility]    PUD (peptic ulcer disease)    Pulmonary emphysema (HCC)    Recurrent depression (HCC)     Family History  Problem Relation Age of Onset   CVA Mother    CAD Mother    Diabetes Mother    COPD Mother    Fibromyalgia Mother    Depression Mother    Hypertension Mother    Emphysema Mother        smoked   Heart attack Mother    Prostate cancer Father    Cancer - Prostate Father    Rheum arthritis Sister    Skin cancer Brother    Kidney Stones Brother    Hernia Brother    Depression Daughter    Healthy Son    Healthy Son    Past Surgical History:  Procedure Laterality Date   ABCESS DRAINAGE Left    under left arm   TONSILLECTOMY  1977   Social History   Social History Narrative   Not on file   Immunization History  Administered Date(s) Administered   Fluad Quad(high Dose 65+) 11/08/2021, 10/27/2022   Influenza, High Dose Seasonal PF 12/30/2019   Influenza-Unspecified 01/04/2018, 12/15/2018, 09/11/2020   PNEUMOCOCCAL CONJUGATE-20 11/08/2021   Pneumococcal Polysaccharide-23 08/02/2020   Tdap 11/05/2020   Zoster Recombinant(Shingrix) 03/20/2021, 11/27/2021     Objective: Vital Signs: BP 111/71 (BP Location: Left Arm, Patient Position: Sitting, Cuff Size: Normal)   Pulse 99   Ht 5\' 2"  (1.575 m)   Wt 80 lb 6.4 oz (36.5 kg)   BMI 14.71 kg/m    Physical Exam Vitals and nursing note reviewed.  Constitutional:      Appearance: She is well-developed.  HENT:     Head: Normocephalic and atraumatic.  Eyes:     Conjunctiva/sclera: Conjunctivae normal.  Cardiovascular:     Rate and Rhythm: Normal rate and regular rhythm.     Heart sounds: Normal heart sounds.  Pulmonary:     Effort: Pulmonary effort is normal.     Breath sounds: Normal breath sounds.  Abdominal:     General: Bowel sounds are normal.     Palpations: Abdomen is soft.  Musculoskeletal:     Cervical back: Normal range of motion.  Lymphadenopathy:     Cervical: No cervical adenopathy.  Skin:     General: Skin is warm and dry.     Capillary Refill: Capillary refill takes less than 2 seconds.  Neurological:     Mental Status: She is alert and oriented to person, place, and time.  Psychiatric:        Behavior: Behavior normal.      Musculoskeletal Exam: C-spine has limited range of motion with lateral rotation.  Thoracic kyphosis noted.  Limited mobility of the lumbar spine.  Shoulder joints have good range of motion with some stiffness and discomfort bilaterally.  Elbow joints have good range of motion.  Nodulus is noted on the extensor surface of both elbows.  Chronic synovitis and tenderness in the right wrist.  Synovial thickening of all MCP joints with ulnar deviation.  Tenderness and synovitis of the right second MCP and left second and third MCP joints.  Hip joints have good range of motion with some discomfort  bilaterally.  Knee joints have good range of motion with no warmth or effusion.  Ankle joints have good range of motion with no joint tenderness or synovitis.  CDAI Exam: CDAI Score: 18  Patient Global: 50 / 100; Provider Global: 50 / 100 Swollen: 4 ; Tender: 4  Joint Exam 08/27/2023      Right  Left  Wrist  Swollen Tender     MCP 2  Swollen Tender  Swollen Tender  MCP 3     Swollen Tender     Investigation: No additional findings.  Imaging: No results found.  Recent Labs: Lab Results  Component Value Date   WBC 9.5 06/05/2023   HGB 13.5 06/05/2023   PLT 264 06/05/2023   NA 139 06/05/2023   K 4.1 06/05/2023   CL 106 06/05/2023   CO2 25 06/05/2023   GLUCOSE 84 06/05/2023   BUN 19 06/05/2023   CREATININE 0.65 06/05/2023   BILITOT 0.3 06/05/2023   ALKPHOS 127 (H) 06/05/2023   AST 23 06/05/2023   ALT 12 06/05/2023   PROT 7.8 06/05/2023   ALBUMIN 3.8 06/05/2023   CALCIUM 9.5 06/05/2023   GFRAA 107 10/26/2020   QFTBGOLDPLUS NEGATIVE 09/17/2020    Speciality Comments: PLQ Eye Exam: 08/14/2022 WNL @ [facility] Follow up 1 year.   Humira and  Orencia were discontinued in the past due to frequent infections per patient.  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  Tenderness and synovitis over several MCP joints.  Synovial thickening of all MCP joints with ulnar deviation.  Patient remains on Plaquenil 200 mg 1 tablet by mouth twice daily and sulfasalazine 500 mg 2 tablets BID.  She is tolerating both medications without any side effects.  She has not had any recent or recurrent infections.  She will remain on combination therapy as prescribed.  She is vies notify us if she develops signs or symptoms of a flare.  She will follow-up in the office in 3 months or sooner if needed.  Rheumatoid nodulosis (HCC): Nodules noted on the extensor surface of both elbows.  Unchanged.  High risk medication use - Plaquenil 200 mg 1 tablet by mouth twice daily and sulfasalazine 500 mg 2 tablets BID.  No longer taking prednisone.  Previously discontinued Orencia and methotrexate due to recurrent infections. CBC and CMP updated on 06/05/23.  Orders for CBC and CMP were released today. PLQ Eye Exam: 08/14/2022 WNL @ [facility] Follow up 1 year.  Patient was given a Plaquenil eye examination form to take with her to her upcoming appointment. - Plan: CBC with Differential/Platelet, COMPLETE METABOLIC PANEL WITH GFR  Primary osteoarthritis of both knees: Patient has good range of motion of both knee joints on examination today.  No warmth or effusion noted.  Patient plans on trying to increase her walking regimen to increase her strength.  She was given a prescription for a walker and a walking cane to assist her with ambulation.  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 She is followed by Dr.  Leonides Schanz.Bone marrow biopsy on 10/24/2020 revealed normocellular marrow with polytypic plasmacytosis (5%).  Reviewed office visit note from 06/17/23. Planning to update lab work in December 2024.    Age-related osteoporosis without current pathological fracture - Forteo started  05/29/22. DEXA 02/07/2022: LFN BMD 0.440 with T score -3.7. She remains on Forteo daily injections-continues to inject at bedtime.  She experiences some dizziness if she wakes up in the night.  Patient was given a prescription for a walker and a walking cane to assist her with ambulation. Plan to update DEXA once she has completed 2 years of Forteo  Vitamin D deficiency: She is taking vitamin D supplement.  Other medical conditions are listed as follows:  Prediabetes  Stage 2 moderate COPD by GOLD classification (HCC)  Lung nodules  Kidney stone  Recurrent depression (HCC)  Tobacco abuse  Hyperlipidemia LDL goal <130  Panlobular emphysema (HCC)  Primary insomnia  Orders: Orders Placed This Encounter  Procedures   CBC with Differential/Platelet   COMPLETE METABOLIC PANEL WITH GFR   No orders of the defined types were placed in this encounter.    Follow-Up Instructions: Return in about 3 months (around 11/27/2023) for Rheumatoid arthritis.   Gearldine Bienenstock, PA-C  Note - This record has been created using Dragon software.  Chart creation errors have been sought, but may not always  have been located. Such creation errors do not reflect on  the standard of medical care.

## 2023-08-27 ENCOUNTER — Encounter: Payer: Self-pay | Admitting: Physician Assistant

## 2023-08-27 ENCOUNTER — Ambulatory Visit: Payer: Medicare Other | Attending: Physician Assistant | Admitting: Physician Assistant

## 2023-08-27 VITALS — BP 111/71 | HR 99 | Ht 62.0 in | Wt 80.4 lb

## 2023-08-27 DIAGNOSIS — J449 Chronic obstructive pulmonary disease, unspecified: Secondary | ICD-10-CM | POA: Diagnosis not present

## 2023-08-27 DIAGNOSIS — R7303 Prediabetes: Secondary | ICD-10-CM | POA: Diagnosis not present

## 2023-08-27 DIAGNOSIS — E559 Vitamin D deficiency, unspecified: Secondary | ICD-10-CM | POA: Diagnosis not present

## 2023-08-27 DIAGNOSIS — J431 Panlobular emphysema: Secondary | ICD-10-CM

## 2023-08-27 DIAGNOSIS — Z79899 Other long term (current) drug therapy: Secondary | ICD-10-CM | POA: Diagnosis not present

## 2023-08-27 DIAGNOSIS — N200 Calculus of kidney: Secondary | ICD-10-CM

## 2023-08-27 DIAGNOSIS — M0630 Rheumatoid nodule, unspecified site: Secondary | ICD-10-CM | POA: Diagnosis not present

## 2023-08-27 DIAGNOSIS — R918 Other nonspecific abnormal finding of lung field: Secondary | ICD-10-CM

## 2023-08-27 DIAGNOSIS — E785 Hyperlipidemia, unspecified: Secondary | ICD-10-CM

## 2023-08-27 DIAGNOSIS — M170 Bilateral primary osteoarthritis of knee: Secondary | ICD-10-CM

## 2023-08-27 DIAGNOSIS — D472 Monoclonal gammopathy: Secondary | ICD-10-CM

## 2023-08-27 DIAGNOSIS — F339 Major depressive disorder, recurrent, unspecified: Secondary | ICD-10-CM

## 2023-08-27 DIAGNOSIS — M810 Age-related osteoporosis without current pathological fracture: Secondary | ICD-10-CM | POA: Diagnosis not present

## 2023-08-27 DIAGNOSIS — M0579 Rheumatoid arthritis with rheumatoid factor of multiple sites without organ or systems involvement: Secondary | ICD-10-CM | POA: Diagnosis not present

## 2023-08-27 DIAGNOSIS — Z720 Tobacco use: Secondary | ICD-10-CM

## 2023-08-27 DIAGNOSIS — F5101 Primary insomnia: Secondary | ICD-10-CM

## 2023-08-28 LAB — CBC WITH DIFFERENTIAL/PLATELET
Absolute Monocytes: 1145 {cells}/uL — ABNORMAL HIGH (ref 200–950)
Basophils Absolute: 85 {cells}/uL (ref 0–200)
Basophils Relative: 0.8 %
Eosinophils Absolute: 276 {cells}/uL (ref 15–500)
Eosinophils Relative: 2.6 %
HCT: 39.9 % (ref 35.0–45.0)
Hemoglobin: 13.3 g/dL (ref 11.7–15.5)
Lymphs Abs: 2724 {cells}/uL (ref 850–3900)
MCH: 30.9 pg (ref 27.0–33.0)
MCHC: 33.3 g/dL (ref 32.0–36.0)
MCV: 92.8 fL (ref 80.0–100.0)
MPV: 10.3 fL (ref 7.5–12.5)
Monocytes Relative: 10.8 %
Neutro Abs: 6371 {cells}/uL (ref 1500–7800)
Neutrophils Relative %: 60.1 %
Platelets: 265 10*3/uL (ref 140–400)
RBC: 4.3 10*6/uL (ref 3.80–5.10)
RDW: 12.7 % (ref 11.0–15.0)
Total Lymphocyte: 25.7 %
WBC: 10.6 10*3/uL (ref 3.8–10.8)

## 2023-08-28 LAB — COMPLETE METABOLIC PANEL WITH GFR
AG Ratio: 1.1 (calc) (ref 1.0–2.5)
ALT: 16 U/L (ref 6–29)
AST: 25 U/L (ref 10–35)
Albumin: 3.7 g/dL (ref 3.6–5.1)
Alkaline phosphatase (APISO): 133 U/L (ref 37–153)
BUN: 21 mg/dL (ref 7–25)
CO2: 28 mmol/L (ref 20–32)
Calcium: 9.8 mg/dL (ref 8.6–10.4)
Chloride: 102 mmol/L (ref 98–110)
Creat: 0.66 mg/dL (ref 0.60–1.00)
Globulin: 3.3 g/dL (ref 1.9–3.7)
Glucose, Bld: 92 mg/dL (ref 65–99)
Potassium: 4.6 mmol/L (ref 3.5–5.3)
Sodium: 138 mmol/L (ref 135–146)
Total Bilirubin: 0.3 mg/dL (ref 0.2–1.2)
Total Protein: 7 g/dL (ref 6.1–8.1)
eGFR: 94 mL/min/{1.73_m2} (ref >=60)

## 2023-08-28 NOTE — Progress Notes (Signed)
CMP WNL Absolute monocytes are elevated. Rest of CBC WNL.  We will continue to monitor.

## 2023-10-13 ENCOUNTER — Other Ambulatory Visit: Payer: Self-pay | Admitting: Internal Medicine

## 2023-10-13 DIAGNOSIS — M0579 Rheumatoid arthritis with rheumatoid factor of multiple sites without organ or systems involvement: Secondary | ICD-10-CM

## 2023-10-13 DIAGNOSIS — E785 Hyperlipidemia, unspecified: Secondary | ICD-10-CM

## 2023-10-21 ENCOUNTER — Other Ambulatory Visit: Payer: Self-pay | Admitting: Rheumatology

## 2023-10-21 DIAGNOSIS — M810 Age-related osteoporosis without current pathological fracture: Secondary | ICD-10-CM

## 2023-10-21 NOTE — Telephone Encounter (Signed)
Last Fill: 07/20/2023  Labs: 08/27/2023 Absolute Monocytes 1,145  Next Visit: 11/23/2023  Last Visit: 08/27/2023  DX: Age-related osteoporosis without current pathological fracture   Current Dose per office note 08/27/2023: Forteo started 05/29/22   Okay to refill Forteo?

## 2023-10-22 ENCOUNTER — Other Ambulatory Visit: Payer: Self-pay | Admitting: Internal Medicine

## 2023-10-22 DIAGNOSIS — F3341 Major depressive disorder, recurrent, in partial remission: Secondary | ICD-10-CM

## 2023-10-23 ENCOUNTER — Other Ambulatory Visit: Payer: Self-pay | Admitting: Rheumatology

## 2023-10-23 DIAGNOSIS — M0579 Rheumatoid arthritis with rheumatoid factor of multiple sites without organ or systems involvement: Secondary | ICD-10-CM

## 2023-10-23 NOTE — Telephone Encounter (Signed)
Last Fill: 07/17/2023  Eye exam: 08/14/2022 WNL   Labs: 08/27/2023 Patient advised CMP WNL Absolute monocytes are elevated. Rest of CBC WNL.   Next Visit: 11/23/2023  Last Visit: 08/27/2023  DX: Rheumatoid arthritis with rheumatoid factor of multiple sites without organ or systems involvement   Current Dose per office note 08/27/2023: Plaquenil 200 mg 1 tablet by mouth twice daily   Left message to advise patient she is due to update her PLQ eye exam.   Okay to refill Plaquenil?

## 2023-11-03 ENCOUNTER — Telehealth: Payer: Self-pay | Admitting: *Deleted

## 2023-11-03 NOTE — Telephone Encounter (Signed)
Patient's daughter contacted the office and states Abigail Lamb was prescribed Forteo. Patient has felt terrible since taking the Forteo and stopped taking it 2-3 weeks ago. Patient has a follow up scheduled for 11/10/2023 to discuss treatment options.

## 2023-11-03 NOTE — Telephone Encounter (Signed)
Options will be discussed at upcoming visit.

## 2023-11-09 NOTE — Progress Notes (Unsigned)
Office Visit Note  Patient: Abigail Lamb             Date of Birth: October 12, 1951           MRN: [id number]             PCP: Etta Grandchild, MD Referring: Etta Grandchild, MD Visit Date: 11/10/2023 Occupation: @GUAROCC @  Subjective:  Discuss osteoporosis treatment options   History of Present Illness: Abigail Lamb is a 72 y.o. female with history of rheumatoid arthritis, osteoarthritis, and osteoporosis.  Patient remains on Plaquenil and sulfasalazine as prescribed for rheumatoid arthritis.  Abigail Lamb has been tolerating combination therapy without any side effects.  Patient states that Abigail Lamb has pain on a daily basis but overall her inflammation has been manageable.  Abigail Lamb has had ongoing discomfort in her right wrist and has been using a brace for support. Patient presents today to discuss treatment alternatives for osteoporosis.  Patient was initiated on Forteo daily injections in June 2023.  Abigail Lamb has been experiencing dizziness and nausea since initiating therapy has led to several gaps in therapy.  Her last dose of Forteo was administered 2 to 3 weeks ago.  Abigail Lamb does not like the way Forteo makes her feel and would like to discontinue.  Abigail Lamb is open to discuss other treatment options today.    Activities of Daily Living:  Patient reports morning stiffness for 2-3 hours.   Patient Reports nocturnal pain.  Difficulty dressing/grooming: Reports Difficulty climbing stairs: Reports Difficulty getting out of chair: Reports Difficulty using hands for taps, buttons, cutlery, and/or writing: Reports  Review of Systems  Constitutional:  Positive for fatigue.  HENT:  Positive for mouth dryness. Negative for mouth sores.   Eyes:  Positive for dryness.  Respiratory:  Positive for cough, shortness of breath and wheezing.   Cardiovascular:  Positive for chest pain and palpitations.  Gastrointestinal:  Positive for nausea. Negative for blood in stool, constipation and diarrhea.  Endocrine: Negative for  increased urination.  Genitourinary:  Positive for involuntary urination.  Musculoskeletal:  Positive for joint pain, gait problem, joint pain, joint swelling, myalgias, muscle weakness, morning stiffness, muscle tenderness and myalgias.  Skin:  Positive for rash and sensitivity to sunlight. Negative for color change and hair loss.  Allergic/Immunologic: Positive for susceptible to infections.  Neurological:  Positive for dizziness. Negative for headaches.  Hematological:  Negative for swollen glands.  Psychiatric/Behavioral:  Positive for depressed mood. Negative for sleep disturbance. The patient is nervous/anxious.     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   Visit for screening mammogram 08/02/2020   Rheumatoid arthritis involving multiple sites with positive rheumatoid factor (HCC) 08/02/2020   Hyperlipidemia LDL goal <130 08/02/2020   Tobacco abuse 08/02/2020   Dupuytren's contracture of right hand 08/02/2020   Pulmonary emphysema (HCC)    Stage 2 moderate COPD by GOLD classification (HCC) 01/30/2020   Neurodermatitis 03/28/2019   Primary insomnia 03/28/2019   Recurrent depression (HCC) 03/28/2019    Past Medical  History:  Diagnosis Date   Celiac disease    GERD (gastroesophageal reflux disease)    MGUS (monoclonal gammopathy of unknown significance)    per patient    Osteoporosis    per patient,  diagnosed by Dr. Darral Dash in [facility]   PUD (peptic ulcer disease)    Pulmonary emphysema (HCC)    Recurrent depression (HCC)     Family History  Problem Relation Age of Onset   CVA Mother    CAD Mother    Diabetes Mother    COPD Mother    Fibromyalgia Mother    Depression Mother    Hypertension Mother    Emphysema Mother        smoked   Heart attack Mother    Prostate cancer Father    Cancer - Prostate Father    Rheum arthritis Sister    Skin cancer Brother    Kidney Stones Brother    Hernia Brother    Depression Daughter    Healthy Son    Healthy Son    Past Surgical History:  Procedure Laterality Date   ABCESS DRAINAGE Left    under left arm   TONSILLECTOMY  1977   Social History   Social History Narrative   Not on file   Immunization History  Administered Date(s) Administered   Fluad Quad(high Dose 65+) 11/08/2021, 10/27/2022   Influenza, High Dose Seasonal PF 12/30/2019   Influenza-Unspecified 01/04/2018, 12/15/2018, 09/11/2020   PNEUMOCOCCAL CONJUGATE-20 11/08/2021   Pneumococcal Polysaccharide-23 08/02/2020   Tdap 11/05/2020   Zoster Recombinant(Shingrix) 03/20/2021, 11/27/2021     Objective: Vital Signs: BP 111/76 (BP Location: Left Arm, Patient Position: Sitting, Cuff Size: Normal)   Pulse 92   Ht 5' 1.25" (1.556 m)   Wt 78 lb 6.4 oz (35.6 kg)   BMI 14.69 kg/m    Physical Exam Vitals and nursing note reviewed.  Constitutional:      Appearance: Abigail Lamb is well-developed.  HENT:     Head: Normocephalic and atraumatic.  Eyes:     Conjunctiva/sclera: Conjunctivae normal.  Cardiovascular:     Rate and Rhythm: Normal rate and regular rhythm.     Heart sounds: Normal heart sounds.  Pulmonary:     Effort: Pulmonary effort is normal.     Breath sounds: Normal breath sounds.  Abdominal:     General: Bowel sounds are normal.     Palpations: Abdomen is soft.  Musculoskeletal:     Cervical back: Normal range of motion.  Lymphadenopathy:     Cervical:  No cervical adenopathy.  Skin:    General: Skin is warm and dry.     Capillary Refill: Capillary refill takes less than 2 seconds.  Neurological:     Mental Status: Abigail Lamb is alert and oriented to person, place, and time.  Psychiatric:        Behavior: Behavior normal.      Musculoskeletal Exam: C-spine has limited range of motion with lateral rotation.  Thoracic kyphosis noted.  Limited mobility of the lumbar spine.  Shoulder joints have good range of motion with no discomfort.  Elbow joints have good range of motion.  Nodules noticed on the extensor surface of both elbows.  Chronic synovitis and tenderness of the right wrist noted.  Synovial thickening of all MCP joints with ulnar deviation.  Tenderness of the right second MCP joint.  Hip joints have good range of motion with no groin pain.  Knee joints have good range of motion no warmth or effusion.  Ankle joints have good range of motion with no tenderness or joint swelling.  CDAI Exam: CDAI Score: -- Patient Global: --; Provider Global: -- Swollen: 1 ; Tender: 1  Joint Exam 11/10/2023      Right  Left  Wrist  Swollen Tender        Investigation: No additional findings.  Imaging: No results found.  Recent Labs: Lab Results  Component Value Date   WBC 10.6 08/27/2023   HGB 13.3 08/27/2023   PLT 265 08/27/2023   NA 138 08/27/2023   K 4.6 08/27/2023   CL 102 08/27/2023   CO2 28 08/27/2023   GLUCOSE 92 08/27/2023   BUN 21 08/27/2023   CREATININE 0.66 08/27/2023   BILITOT 0.3 08/27/2023   ALKPHOS 127 (H) 06/05/2023   AST 25 08/27/2023   ALT 16 08/27/2023   PROT 7.0 08/27/2023   ALBUMIN 3.8 06/05/2023   CALCIUM 9.8 08/27/2023   GFRAA 107 10/26/2020   QFTBGOLDPLUS NEGATIVE 09/17/2020    Speciality Comments: PLQ Eye Exam: 08/14/2022 WNL @ [facility] Follow up 1 year.   Humira and Orencia were discontinued in the past due to frequent infections per patient.  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  Tenderness and synovitis over several MCP joints.  Synovial thickening of all MCP joints with ulnar deviation: Patient continues to have synovitis and tenderness involving the right wrist.  Abigail Lamb has been using a right wrist brace for support.  Abigail Lamb has been taking sulfasalazine 2 tablets in the morning and 2 tablets in the evening along with Plaquenil 200 mg 1 tablet by mouth twice daily.  Abigail Lamb has been tolerating combination therapy but continues to have breakthrough symptoms.  Abigail Lamb is apprehensive of adding any other medications due to possible side effects and recurrent infections.  Abigail Lamb will notify us if Abigail Lamb develops any new or worsening symptoms.  Abigail Lamb will follow-up in the office in 3 months or sooner if needed.  Rheumatoid nodulosis (HCC) - Nodules noted on the extensor surface of both elbows.  Unchanged.  High risk medication use - Plaquenil 200 mg 1 tablet PO BID and sulfasalazine 500 mg 2 tablets BID.  No longer taking prednisone.  Humira and Orencia were previously discontinued due to recurrent infections. CBC and CMP updated on 08/27/23.  Orders for CBC and CMP were released today.  Her next lab work will be due in February and every 3 months to monitor for drug toxicity. No recent or recurrent infections.  Discussed the importance of holding sulfasalazine if Abigail Lamb develops signs or symptoms of an infection and to resume once the infection is completely cleared. PLQ Eye Exam: 08/14/2022 WNL @ [facility] Follow up 1 year.   - Plan: COMPLETE METABOLIC PANEL WITH GFR, CBC with Differential/Platelet  Primary osteoarthritis of both knees: Abigail Lamb has good range of motion of both knee joints on examination today.  No warmth or effusion noted.  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 Abigail Lamb is followed by Dr. Leonides Schanz.Bone marrow biopsy on 10/24/2020 revealed normocellular marrow with polytypic plasmacytosis (5%).  Age-related osteoporosis without current pathological fracture - DEXA 02/07/2022: LFN BMD 0.440 with T score -3.7. No recent falls.  Previous therapy: Prolia 2016-1 year of treatment in [place]-discontinued due to cost.  Forteo daily injections started 05/29/22.  Patient discontinued forteo 2-3 weeks due to daily side effects-nausea and dizziness.  Abigail Lamb can no longer tolerate continuing Forteo,so Abigail Lamb presents today to discuss alternative treatment options.  Discussed the option of possibly switching from Forteo to [place] but discussed that the safety profile is essentially the same.  Patient is apprehensive to initiate Evenity due to possible side effects especially since Abigail Lamb is a smoker. Discussed switching to IV Reclast infusions once yearly.  Indications, contraindications, potential side effects of IV Reclast were discussed today in detail.  All questions were addressed.  Plan to apply for IV Reclast through her insurance and once approved Abigail Lamb will be set up for the first infusion. Plan to update CBC, CMP, and vitamin D today prior to switching to IV reclast.   Vitamin D deficiency -Vitamin D will be checked today.  Plan: VITAMIN D 25 Hydroxy (Vit-D Deficiency, Fractures)  Other medical conditions are listed as follows:  Prediabetes  Stage 2 moderate COPD by GOLD classification (HCC)  Lung nodules  Kidney stone  Recurrent depression (HCC)  Tobacco abuse  Hyperlipidemia LDL goal <130  Panlobular emphysema (HCC)  Primary insomnia  Orders: Orders Placed This Encounter  Procedures   COMPLETE METABOLIC PANEL WITH GFR   CBC with Differential/Platelet   VITAMIN D 25 Hydroxy (Vit-D Deficiency, Fractures)   No orders of the defined types were placed in this encounter.    Follow-Up Instructions: Return in 3 months (on  02/10/2024) for Rheumatoid arthritis, Osteoarthritis, Osteoporosis.   Gearldine Bienenstock, PA-C  Note - This record has been created using Dragon software.  Chart creation errors have been sought, but may not always  have been located. Such creation errors do not reflect on  the standard of medical care.

## 2023-11-10 ENCOUNTER — Encounter: Payer: Self-pay | Admitting: Physician Assistant

## 2023-11-10 ENCOUNTER — Ambulatory Visit: Payer: Medicare Other | Attending: Physician Assistant | Admitting: Physician Assistant

## 2023-11-10 ENCOUNTER — Telehealth: Payer: Self-pay | Admitting: Pharmacist

## 2023-11-10 VITALS — BP 111/76 | HR 92 | Ht 61.25 in | Wt 78.4 lb

## 2023-11-10 DIAGNOSIS — M810 Age-related osteoporosis without current pathological fracture: Secondary | ICD-10-CM

## 2023-11-10 DIAGNOSIS — M0579 Rheumatoid arthritis with rheumatoid factor of multiple sites without organ or systems involvement: Secondary | ICD-10-CM

## 2023-11-10 DIAGNOSIS — Z79899 Other long term (current) drug therapy: Secondary | ICD-10-CM

## 2023-11-10 DIAGNOSIS — D472 Monoclonal gammopathy: Secondary | ICD-10-CM

## 2023-11-10 DIAGNOSIS — F5101 Primary insomnia: Secondary | ICD-10-CM

## 2023-11-10 DIAGNOSIS — M0630 Rheumatoid nodule, unspecified site: Secondary | ICD-10-CM | POA: Diagnosis not present

## 2023-11-10 DIAGNOSIS — E785 Hyperlipidemia, unspecified: Secondary | ICD-10-CM

## 2023-11-10 DIAGNOSIS — N200 Calculus of kidney: Secondary | ICD-10-CM | POA: Diagnosis not present

## 2023-11-10 DIAGNOSIS — J449 Chronic obstructive pulmonary disease, unspecified: Secondary | ICD-10-CM

## 2023-11-10 DIAGNOSIS — R7303 Prediabetes: Secondary | ICD-10-CM | POA: Diagnosis not present

## 2023-11-10 DIAGNOSIS — F339 Major depressive disorder, recurrent, unspecified: Secondary | ICD-10-CM

## 2023-11-10 DIAGNOSIS — E559 Vitamin D deficiency, unspecified: Secondary | ICD-10-CM

## 2023-11-10 DIAGNOSIS — M170 Bilateral primary osteoarthritis of knee: Secondary | ICD-10-CM

## 2023-11-10 DIAGNOSIS — R918 Other nonspecific abnormal finding of lung field: Secondary | ICD-10-CM | POA: Diagnosis not present

## 2023-11-10 DIAGNOSIS — J431 Panlobular emphysema: Secondary | ICD-10-CM

## 2023-11-10 DIAGNOSIS — Z720 Tobacco use: Secondary | ICD-10-CM

## 2023-11-10 NOTE — Telephone Encounter (Signed)
Pending baseline labs from today, patient will be starting Reclast.  Referral will be placed to [facility] once labs result  Chesley Mires, PharmD, MPH, BCPS, CPP Clinical Pharmacist (Rheumatology and Pulmonology)

## 2023-11-10 NOTE — Patient Instructions (Signed)
Will place referral to [facility] (Phone: [phone])  Maintain calcium 600mg  twice daily supplementation and Vitamin D 1000-2000 units daily   Calcium Content in Foods Calcium is the most abundant mineral in the body. Most of the body's calcium supply is stored in bones and teeth. Calcium helps many parts of the body function normally, including: Blood and blood vessels. Nerves. Hormones. Muscles. Bones and teeth. When your calcium stores are low, you may be at risk for low bone mass, bone loss, and broken bones (fractures). When you get enough calcium, it helps to support strong bones and teeth throughout your life. Calcium is especially important for: Children during growth spurts. Girls during adolescence. Women who are pregnant or breastfeeding. Women after their menstrual cycle stops (postmenopause). Women whose menstrual cycle has stopped due to anorexia nervosa or regular intense exercise. People who cannot eat or digest dairy products. Vegans. Recommended daily amounts of calcium: Women (ages 18 to 27): 1,000 mg per day. Women (ages 23 and older): 1,200 mg per day. Men (ages 13 to 20): 1,000 mg per day. Men (ages 8 and older): 1,200 mg per day. Women (ages 9 to 63): 1,300 mg per day. Men (ages 60 to 41): 1,300 mg per day. General information Eat foods that are high in calcium. Try to get most of your calcium from food. Some people may benefit from taking calcium supplements. Check with your health care provider or diet and nutrition specialist (dietitian) before starting any calcium supplements. Calcium supplements may interact with certain medicines. Too much calcium may cause other health problems, such as constipation and kidney stones. For the body to absorb calcium, it needs vitamin D. Sources of vitamin D include: Skin exposure to direct sunlight. Foods, such as egg yolks, liver, mushrooms, saltwater fish, and fortified milk. Vitamin D  supplements. Check with your health care provider or dietitian before starting any vitamin D supplements. What foods are high in calcium?  Foods that are high in calcium contain more than 100 milligrams per serving. Fruits Fortified orange juice or other fruit juice, 300 mg per 8 oz serving. Vegetables Collard greens, 360 mg per 8 oz serving. Kale, 100 mg per 8 oz serving. Bok choy, 160 mg per 8 oz serving. Grains Fortified ready-to-eat cereals, 100 to 1,000 mg per 8 oz serving. Fortified frozen waffles, 200 mg in 2 waffles. Oatmeal, 140 mg in 1 cup. Meats and other proteins Sardines, canned with bones, 325 mg per 3 oz serving. Salmon, canned with bones, 180 mg per 3 oz serving. Canned shrimp, 125 mg per 3 oz serving. Baked beans, 160 mg per 4 oz serving. Tofu, firm, made with calcium sulfate, 253 mg per 4 oz serving. Dairy Yogurt, plain, low-fat, 310 mg per 6 oz serving. Nonfat milk, 300 mg per 8 oz serving. American cheese, 195 mg per 1 oz serving. Cheddar cheese, 205 mg per 1 oz serving. Cottage cheese 2%, 105 mg per 4 oz serving. Fortified soy, rice, or almond milk, 300 mg per 8 oz serving. Mozzarella, part skim, 210 mg per 1 oz serving. The items listed above may not be a complete list of foods high in calcium. Actual amounts of calcium may be different depending on processing. Contact a dietitian for more information. What foods are lower in calcium? Foods that are lower in calcium contain 50 mg or less per serving. Fruits Apple, about 6 mg. Banana, about 12 mg. Vegetables Lettuce, 19 mg per 2 oz serving. Tomato, about  11 mg. Grains Rice, 4 mg per 6 oz serving. Boiled potatoes, 14 mg per 8 oz serving. White bread, 6 mg per slice. Meats and other proteins Egg, 27 mg per 2 oz serving. Red meat, 7 mg per 4 oz serving. Chicken, 17 mg per 4 oz serving. Fish, cod, or trout, 20 mg per 4 oz serving. Dairy Cream cheese, regular, 14 mg per 1 Tbsp serving. Brie cheese, 50  mg per 1 oz serving. Parmesan cheese, 70 mg per 1 Tbsp serving. The items listed above may not be a complete list of foods lower in calcium. Actual amounts of calcium may be different depending on processing. Contact a dietitian for more information.  Zoledronic Acid Injection (Bone Disorders) What is this medication? ZOLEDRONIC ACID (ZOE le dron ik AS id) prevents and treats osteoporosis. It may also be used to treat Paget's disease of the bone. It works by Interior and spatial designer stronger and less likely to break (fracture). It belongs to a group of medications called bisphosphonates. This medicine may be used for other purposes; ask your health care provider or pharmacist if you have questions. COMMON BRAND NAME(S): Reclast What should I tell my care team before I take this medication? They need to know if you have any of these conditions: Bleeding disorder Cancer Dental disease Kidney disease Low levels of calcium in the blood Low red blood cell counts Lung or breathing disease, such as asthma Receiving steroids, such as dexamethasone or prednisone An unusual or allergic reaction to zoledronic acid, other medications, foods, dyes, or preservatives Pregnant or trying to get pregnant Breast-feeding How should I use this medication? This medication is injected into a vein. It is given by your care team in a hospital or clinic setting. A special MedGuide will be given to you before each treatment. Be sure to read this information carefully each time. Talk to your care team about the use of this medication in children. Special care may be needed. Overdosage: If you think you have taken too much of this medicine contact a poison control center or emergency room at once. NOTE: This medicine is only for you. Do not share this medicine with others. What if I miss a dose? Keep appointments for follow-up doses. It is important not to miss your dose. Call your care team if you are unable to keep an  appointment. What may interact with this medication? Certain antibiotics given by injection Medications for pain and inflammation, such as ibuprofen, naproxen, NSAIDs Some diuretics, such as bumetanide, furosemide Teriparatide This list may not describe all possible interactions. Give your health care provider a list of all the medicines, herbs, non-prescription drugs, or dietary supplements you use. Also tell them if you smoke, drink alcohol, or use illegal drugs. Some items may interact with your medicine. What should I watch for while using this medication? Visit your care team for regular checks on your progress. It may be some time before you see the benefit from this medication. Some people who take this medication have severe bone, joint, or muscle pain. This medication may also increase your risk for jaw problems or a broken thigh bone. Tell your care team right away if you have severe pain in your jaw, bones, joints, or muscles. Tell your care team if you have any pain that does not go away or that gets worse. You should make sure you get enough calcium and vitamin D while you are taking this medication. Discuss the foods you eat and the  vitamins you take with your care team. You may need bloodwork while taking this medication. Tell your dentist and dental surgeon that you are taking this medication. You should not have major dental surgery while on this medication. See your dentist to have a dental exam and fix any dental problems before starting this medication. Take good care of your teeth while on this medication. Make sure you see your dentist for regular follow-up appointments. What side effects may I notice from receiving this medication? Side effects that you should report to your care team as soon as possible: Allergic reactions--skin rash, itching, hives, swelling of the face, lips, tongue, or throat Kidney injury--decrease in the amount of urine, swelling of the ankles, hands, or  feet Low calcium level--muscle pain or cramps, confusion, tingling, or numbness in the hands or feet Osteonecrosis of the jaw--pain, swelling, or redness in the mouth, numbness of the jaw, poor healing after dental work, unusual discharge from the mouth, visible bones in the mouth Severe bone, joint, or muscle pain Side effects that usually do not require medical attention (report to your care team if they continue or are bothersome): Diarrhea Dizziness Headache Nausea Stomach pain Vomiting This list may not describe all possible side effects. Call your doctor for medical advice about side effects. You may report side effects to FDA at 1-800-FDA-1088. Where should I keep my medication? This medication is given in a hospital or clinic. It will not be stored at home. NOTE: This sheet is a summary. It may not cover all possible information. If you have questions about this medicine, talk to your doctor, pharmacist, or health care provider.  2024 Elsevier/Gold Standard (2022-01-31 00:00:00)

## 2023-11-10 NOTE — Progress Notes (Signed)
Pharmacy Note  Subjective: Patient presents today to the [facility] for follow up office visit.   Patient seen by pharmacist for counseling on Reclast therapy for osteoporosis. Prior osteoporosis treatment includes:Forteo (1.5 years of treatment but has been missing doses here and there due to side effects, last dose about 2-3 weeks ago), Prolia (for 2 doses in 2016 in [place], discontinued due to cost).  Objective: T-score: DEXA updated on 02/07/2022: LFN BMD 0.440 with T score -3.7  Lab Results  Component Value Date   VD25OH 69 02/11/2022   CMP     Component Value Date/Time   NA 138 08/27/2023 1556   K 4.6 08/27/2023 1556   CL 102 08/27/2023 1556   CO2 28 08/27/2023 1556   GLUCOSE 92 08/27/2023 1556   BUN 21 08/27/2023 1556   CREATININE 0.66 08/27/2023 1556   CALCIUM 9.8 08/27/2023 1556   PROT 7.0 08/27/2023 1556   ALBUMIN 3.8 06/05/2023 1324   AST 25 08/27/2023 1556   AST 23 06/05/2023 1324   ALT 16 08/27/2023 1556   ALT 12 06/05/2023 1324   ALKPHOS 127 (H) 06/05/2023 1324   BILITOT 0.3 08/27/2023 1556   BILITOT 0.3 06/05/2023 1324   GFRNONAA >60 06/05/2023 1324   GFRNONAA 93 10/26/2020 1548   GFRAA 107 10/26/2020 1548    Osteoporosis risk factors include: age, female, post-menopausal, smoker  Assessment and Plan:  Counseled patient that Reclast is an IV bisphosphonate that reduces bone turnover by inhibiting osteoclasts that chew up bone.  Counseled patient on purpose, proper use, and adverse effects of Reclast.  Reviewed adverse events of Reclast including risk of nausea & diarrhea, headache, and muscle & bone pain.  Reviewed rare adverse effect of osteonecrosis of the jaw and advised patient to alert her dentist that she is on Reclast prior to any major dental work.  Patient confirms she does not have any major dental work scheduled at this time.    Reviewed importance of taking calcium and vitamin D with bisphosphonate therapy. Recommended daily  amount of calcium is 1200mg  and vitamin D [phone] units.  Advised calcium is better obtained through diet vs supplement.  Counseled about risk of excess calcium supplementation such a kidney stones and increased risk of heart disease.   Provided patient with medication education material and answered all questions. Patient agrees to trial of Reclast IV infusion 5 mg yearly at this time.  Referral will be placed to [facility] once labs from today result. Patient verbalized understanding.   Chesley Mires, PharmD, MPH, BCPS, CPP Clinical Pharmacist (Rheumatology and Pulmonology)

## 2023-11-11 ENCOUNTER — Telehealth: Payer: Self-pay

## 2023-11-11 ENCOUNTER — Other Ambulatory Visit: Payer: Self-pay | Admitting: Pharmacist

## 2023-11-11 LAB — CBC WITH DIFFERENTIAL/PLATELET
Absolute Lymphocytes: 2915 {cells}/uL (ref 850–3900)
Absolute Monocytes: 1081 {cells}/uL — ABNORMAL HIGH (ref 200–950)
Basophils Absolute: 95 {cells}/uL (ref 0–200)
Basophils Relative: 0.9 %
Eosinophils Absolute: 286 {cells}/uL (ref 15–500)
Eosinophils Relative: 2.7 %
HCT: 43.3 % (ref 35.0–45.0)
Hemoglobin: 14.4 g/dL (ref 11.7–15.5)
MCH: 31.2 pg (ref 27.0–33.0)
MCHC: 33.3 g/dL (ref 32.0–36.0)
MCV: 93.7 fL (ref 80.0–100.0)
MPV: 10.4 fL (ref 7.5–12.5)
Monocytes Relative: 10.2 %
Neutro Abs: 6222 {cells}/uL (ref 1500–7800)
Neutrophils Relative %: 58.7 %
Platelets: 308 10*3/uL (ref 140–400)
RBC: 4.62 10*6/uL (ref 3.80–5.10)
RDW: 12.4 % (ref 11.0–15.0)
Total Lymphocyte: 27.5 %
WBC: 10.6 10*3/uL (ref 3.8–10.8)

## 2023-11-11 LAB — COMPLETE METABOLIC PANEL WITH GFR
AG Ratio: 1.1 (calc) (ref 1.0–2.5)
ALT: 14 U/L (ref 6–29)
AST: 26 U/L (ref 10–35)
Albumin: 3.7 g/dL (ref 3.6–5.1)
Alkaline phosphatase (APISO): 111 U/L (ref 37–153)
BUN: 21 mg/dL (ref 7–25)
CO2: 28 mmol/L (ref 20–32)
Calcium: 9.4 mg/dL (ref 8.6–10.4)
Chloride: 102 mmol/L (ref 98–110)
Creat: 0.66 mg/dL (ref 0.60–1.00)
Globulin: 3.3 g/dL (ref 1.9–3.7)
Glucose, Bld: 90 mg/dL (ref 65–99)
Potassium: 4.1 mmol/L (ref 3.5–5.3)
Sodium: 138 mmol/L (ref 135–146)
Total Bilirubin: 0.2 mg/dL (ref 0.2–1.2)
Total Protein: 7 g/dL (ref 6.1–8.1)
eGFR: 94 mL/min/{1.73_m2} (ref >=60)

## 2023-11-11 LAB — VITAMIN D 25 HYDROXY (VIT D DEFICIENCY, FRACTURES): Vit D, 25-Hydroxy: 79 ng/mL (ref 30–100)

## 2023-11-11 NOTE — Telephone Encounter (Signed)
Auth Submission: NO AUTH NEEDED Site of care: Site of care: [facility] Payer: [employer] Medicare Medication & CPT/J Code(s) submitted: Reclast (Zolendronic acid) [id number] Route of submission (phone, fax, portal): portal Phone # Fax # Auth type: Buy/Bill PB Units/visits requested: 1 dose Reference number: [id number] Approval from: 11/11/23 to 12/29/23

## 2023-11-11 NOTE — Progress Notes (Signed)
Therapy plan placed for Reclast IV [id number]) for [facility] to start benefits investigation  Diagnosis: age-related osteoporosis  Provider: Sherron Ales, PA-C  Dose: 5mg  IV every 12 months  Last Clinic Visit: 11/10/23 Next Clinic Visit: 02/23/24  Pertinent Labs: CMP and Vitamin D on 11/10/23 wnl

## 2023-11-11 NOTE — Progress Notes (Signed)
Absolute monocytes remain borderline elevated. Rest of CBC WNL.   CMP WNL Vitamin D WNL

## 2023-11-17 ENCOUNTER — Ambulatory Visit: Payer: Medicare Other | Admitting: Internal Medicine

## 2023-11-17 ENCOUNTER — Ambulatory Visit (INDEPENDENT_AMBULATORY_CARE_PROVIDER_SITE_OTHER): Payer: Medicare Other

## 2023-11-17 VITALS — BP 116/78 | HR 90 | Temp 96.9°F | Resp 16 | Ht 61.25 in | Wt 74.8 lb

## 2023-11-17 DIAGNOSIS — J439 Emphysema, unspecified: Secondary | ICD-10-CM | POA: Diagnosis not present

## 2023-11-17 DIAGNOSIS — J22 Unspecified acute lower respiratory infection: Secondary | ICD-10-CM

## 2023-11-17 DIAGNOSIS — Z23 Encounter for immunization: Secondary | ICD-10-CM | POA: Diagnosis not present

## 2023-11-17 DIAGNOSIS — J90 Pleural effusion, not elsewhere classified: Secondary | ICD-10-CM

## 2023-11-17 DIAGNOSIS — R058 Other specified cough: Secondary | ICD-10-CM | POA: Diagnosis not present

## 2023-11-17 DIAGNOSIS — R0782 Intercostal pain: Secondary | ICD-10-CM

## 2023-11-17 DIAGNOSIS — J449 Chronic obstructive pulmonary disease, unspecified: Secondary | ICD-10-CM | POA: Diagnosis not present

## 2023-11-17 DIAGNOSIS — R059 Cough, unspecified: Secondary | ICD-10-CM | POA: Diagnosis not present

## 2023-11-17 MED ORDER — ANORO ELLIPTA 62.5-25 MCG/ACT IN AEPB: 1.0000 | INHALATION_SPRAY | Freq: Every day | RESPIRATORY_TRACT | 0 refills | Status: DC

## 2023-11-17 NOTE — Progress Notes (Unsigned)
Subjective:  Patient ID: Abigail Lamb, female    DOB: 1951-01-06  Age: 72 y.o. MRN: [id number]  CC: COPD and Cough   HPI Abigail Lamb presents for f/up ---  Discussed the use of AI scribe software for clinical note transcription with the patient, who gave verbal consent to proceed.  History of Present Illness   The patient, with a history of COPD and RA, presents with a productive cough and phlegm for approximately two weeks. The phlegm is described as having a yellow color. She reports intermittent shortness of breath and wheezing, which she notes as being worse than her baseline. She denies hemoptysis but does report occasional chest pain, described as a jabbing sensation under the sternum, which she attributes to anxiety. This chest pain has been ongoing for quite some time. She also reports limited physical activity due to her symptoms.  The patient also mentions ongoing pain related to RA and weakness in her hands and arms, which she attributes to carpal tunnel syndrome. She reports difficulty with mobility, but notes some improvement recently. She also mentions a history of nicotine addiction and ongoing smoking, which she acknowledges may be contributing to her worsening COPD symptoms.       Outpatient Medications Prior to Visit  Medication Sig Dispense Refill   albuterol (VENTOLIN HFA) 108 (90 Base) MCG/ACT inhaler Take 1-2 puffs every 4-6 hrs prn shortness of breath. Needs ventolin.     celecoxib (CELEBREX) 50 MG capsule TAKE 1 CAPSULE BY MOUTH 2 TIMES DAILY. 60 capsule 1   Cholecalciferol (VITAMIN D3) 125 MCG (5000 UT) CAPS Take 5,000 Units by mouth daily.     clindamycin (CLEOCIN T) 1 % external solution as needed.      clobetasol (OLUX) 0.05 % topical foam Apply topically as needed.     clonazePAM (KLONOPIN) 0.5 MG tablet TAKE 1 TABLET BY MOUTH 2 TIMES DAILY AS NEEDED FOR ANXIETY. 60 tablet 3   co-enzyme Q-10 30 MG capsule Take by mouth.     dicyclomine (BENTYL) 10 MG  capsule TAKE 1 CAPSULE (10 MG TOTAL) BY MOUTH 4 TIMES A DAY BEFORE MEALS AND AT BEDTIME 360 capsule 0   DULoxetine (CYMBALTA) 30 MG capsule Take 3 capsules (90 mg total) by mouth daily. For total of 90 mg daily (Patient taking differently: Take 30 mg by mouth at bedtime. For total of 90 mg daily) 270 capsule 0   DULoxetine (CYMBALTA) 60 MG capsule Take 60 mg by mouth daily.     famotidine (PEPCID) 40 MG tablet TAKE 1 TABLET BY MOUTH TWICE A DAY 180 tablet 1   fluocinonide-emollient (LIDEX-E) 0.05 % cream Apply 1 application topically 2 (two) times daily. 60 g 3   fluticasone (FLONASE) 50 MCG/ACT nasal spray Place 1 spray into both nostrils daily. (Patient taking differently: Place 1 spray into both nostrils as needed.) 16 g 11   folic acid (FOLVITE) 1 MG tablet Take by mouth.     hydroxychloroquine (PLAQUENIL) 200 MG tablet TAKE 1 TABLET BY MOUTH TWICE A DAY 180 tablet 0   hydrOXYzine (ATARAX) 10 MG tablet TAKE 1 TABLET BY MOUTH EVERY 8 HOURS AS NEEDED 270 tablet 0   sulfaSALAzine (AZULFIDINE) 500 MG tablet TAKE 2 TABLETS BY MOUTH TWICE A DAY 120 tablet 2   colesevelam (WELCHOL) 625 MG tablet TAKE 3 TABLETS BY MOUTH TWICE A DAY WITH MEAL 180 tablet 0   umeclidinium-vilanterol (ANORO ELLIPTA) 62.5-25 MCG/ACT AEPB Inhale 1 puff into the lungs daily. 1 each 11  ondansetron (ZOFRAN) 4 MG tablet TAKE 1 TABLET BY MOUTH EVERY 8 HOURS AS NEEDED FOR NAUSEA AND VOMITING 40 tablet 2   dronabinol (MARINOL) 2.5 MG capsule TAKE 1 CAPSULE BY MOUTH 2 TIMES DAILY BEFORE LUNCH AND SUPPER (Patient not taking: Reported on 05/14/2023) 180 capsule 0   FORTEO 600 MCG/2.4ML SOPN INJECT 0.08ML ( ) UNDER THE SKIN ONCE DAILY (Patient not taking: Reported on 11/10/2023) 9.6 mL 0   Insulin Pen Needle 31G X 5 MM MISC Use to inject Forteo once daily. DO NOT REUSE NEEDLE. (Patient not taking: Reported on 11/10/2023) 100 each 2   loratadine (CLARITIN) 10 MG tablet Take 10 mg by mouth daily. (Patient not taking: Reported on  11/17/2023)     TUBERCULIN SYR 1CC/25GX5/8" 25G X 5/8" 1 ML MISC Use 1 syringe weekly for Methotrexate injection. (Patient not taking: Reported on 08/27/2023) 12 each 3   varenicline (CHANTIX) 1 MG tablet TAKE 1 TABLET BY MOUTH TWICE A DAY (Patient not taking: Reported on 05/14/2023) 180 tablet 0   No facility-administered medications prior to visit.    ROS Review of Systems  Constitutional:  Positive for fatigue and unexpected weight change (wt loss). Negative for appetite change, chills, diaphoresis and fever.  HENT: Negative.    Eyes: Negative.   Respiratory:  Positive for cough and shortness of breath. Negative for chest tightness and wheezing.   Cardiovascular:  Negative for chest pain, palpitations and leg swelling.  Gastrointestinal:  Positive for nausea. Negative for abdominal pain, constipation, diarrhea and vomiting.  Genitourinary:  Negative for difficulty urinating.  Musculoskeletal:  Positive for arthralgias. Negative for joint swelling and myalgias.  Skin: Negative.   Neurological:  Negative for dizziness and weakness.  Hematological:  Negative for adenopathy. Does not bruise/bleed easily.  Psychiatric/Behavioral: Negative.      Objective:  BP 116/78 (BP Location: Left Arm, Patient Position: Sitting, Cuff Size: Small)   Pulse 90   Temp (!) 96.9 F (36.1 C) (Temporal)   Resp 16   Ht 5' 1.25" (1.556 m)   Wt 74 lb 12.8 oz (33.9 kg)   SpO2 94%   BMI 14.02 kg/m   BP Readings from Last 3 Encounters:  11/17/23 116/78  11/10/23 111/76  08/27/23 111/71    Wt Readings from Last 3 Encounters:  11/17/23 74 lb 12.8 oz (33.9 kg)  11/10/23 78 lb 6.4 oz (35.6 kg)  08/27/23 80 lb 6.4 oz (36.5 kg)    Physical Exam Vitals reviewed.  Constitutional:      General: She is not in acute distress.    Appearance: She is cachectic. She is ill-appearing. She is not toxic-appearing or diaphoretic.  HENT:     Mouth/Throat:     Mouth: Mucous membranes are moist.  Eyes:      General: No scleral icterus.    Conjunctiva/sclera: Conjunctivae normal.  Cardiovascular:     Rate and Rhythm: Normal rate and regular rhythm.     Heart sounds: Normal heart sounds, S1 normal and S2 normal.     No friction rub. No gallop.  Pulmonary:     Effort: Pulmonary effort is normal.     Breath sounds: Examination of the right-upper field reveals decreased breath sounds. Examination of the left-upper field reveals decreased breath sounds. Examination of the right-middle field reveals decreased breath sounds. Examination of the left-middle field reveals decreased breath sounds. Examination of the right-lower field reveals decreased breath sounds. Examination of the left-lower field reveals decreased breath sounds. Decreased breath sounds present. No wheezing,  rhonchi or rales.  Abdominal:     General: Abdomen is flat.     Palpations: There is no mass.     Tenderness: There is no abdominal tenderness. There is no guarding.     Hernia: No hernia is present.  Musculoskeletal:        General: Deformity present. No swelling.     Cervical back: Neck supple.     Right lower leg: No edema.     Left lower leg: No edema.  Lymphadenopathy:     Cervical: No cervical adenopathy.  Skin:    General: Skin is warm and dry.  Neurological:     General: No focal deficit present.     Mental Status: She is alert.  Psychiatric:        Mood and Affect: Mood normal.        Behavior: Behavior normal.     Lab Results  Component Value Date   WBC 10.6 11/10/2023   HGB 14.4 11/10/2023   HCT 43.3 11/10/2023   PLT 308 11/10/2023   GLUCOSE 90 11/10/2023   CHOL 164 10/27/2022   TRIG 132.0 10/27/2022   HDL 66.20 10/27/2022   LDLCALC 71 10/27/2022   ALT 14 11/10/2023   AST 26 11/10/2023   NA 138 11/10/2023   K 4.1 11/10/2023   CL 102 11/10/2023   CREATININE 0.66 11/10/2023   BUN 21 11/10/2023   CO2 28 11/10/2023   TSH 4.12 05/22/2023   HGBA1C 5.5 08/02/2020    DG Bone Survey Met  Result  Date: 12/07/2022 CLINICAL DATA:  MGUS, assess for lytic lesions. EXAM: METASTATIC BONE SURVEY COMPARISON:  12/19/2021. FINDINGS: There is a possible small lytic lesion in the proximal humeral shaft on the right. Severe degenerative changes are noted at the wrists bilaterally. Degenerative changes are present in the cervical, thoracic, and lumbar spine. There is atherosclerotic calcification of the aorta. There is hyperinflation of the lungs. IMPRESSION: 1. Small lytic lesion in the proximal humeral shaft on the right. 2. No other lytic or destructive lesion is seen in the bones. 3. Aortic atherosclerosis. Electronically Signed   By: Thornell Sartorius M.D.   On: 12/07/2022 00:37   DG Chest 2 View  Result Date: 11/17/2023 CLINICAL DATA:  Worsening cough for 2 weeks EXAM: CHEST - 2 VIEW COMPARISON:  05/11/2023 FINDINGS: Frontal and lateral views of the chest demonstrate an unremarkable cardiac silhouette. Lungs are hyperinflated with background emphysema again noted. No airspace disease or pneumothorax. Trace right pleural effusion. IMPRESSION: 1. Emphysema.  No acute airspace disease. 2. Trace right pleural effusion. Electronically Signed   By: Sharlet Salina M.D.   On: 11/17/2023 20:20     Assessment & Plan:   Need for immunization against influenza -     Flu Vaccine Trivalent High Dose (Fluad)  Cough productive of yellow sputum -     DG Chest 2 View; Future  Stage 2 moderate COPD by GOLD classification (HCC) -     Anoro Ellipta; Inhale 1 puff into the lungs daily.  Dispense: 120 each; Refill: 0  Pleural effusion, right - Will evaluate for occult infection/malignancy -     CT CHEST W CONTRAST; Future  Intercostal pain -     CT CHEST W CONTRAST; Future  LRTI (lower respiratory tract infection) -     Cefdinir; Take 1 capsule (300 mg total) by mouth 2 (two) times daily for 7 days.  Dispense: 14 capsule; Refill: 0     Follow-up: Return in about  3 months (around 02/17/2024).  Sanda Linger, MD

## 2023-11-17 NOTE — Patient Instructions (Signed)

## 2023-11-18 ENCOUNTER — Other Ambulatory Visit: Payer: Self-pay | Admitting: Internal Medicine

## 2023-11-18 DIAGNOSIS — R0782 Intercostal pain: Secondary | ICD-10-CM | POA: Insufficient documentation

## 2023-11-18 DIAGNOSIS — E785 Hyperlipidemia, unspecified: Secondary | ICD-10-CM

## 2023-11-18 DIAGNOSIS — J22 Unspecified acute lower respiratory infection: Secondary | ICD-10-CM | POA: Insufficient documentation

## 2023-11-18 DIAGNOSIS — J90 Pleural effusion, not elsewhere classified: Secondary | ICD-10-CM | POA: Insufficient documentation

## 2023-11-18 MED ORDER — CEFDINIR 300 MG PO CAPS: 300.0000 mg | ORAL_CAPSULE | Freq: Two times a day (BID) | ORAL | 0 refills | Status: AC

## 2023-11-20 NOTE — Progress Notes (Signed)
Reclast scheduled for 12/03/23 at [facility]

## 2023-11-23 ENCOUNTER — Ambulatory Visit: Payer: Medicare Other | Admitting: Physician Assistant

## 2023-11-27 ENCOUNTER — Other Ambulatory Visit: Payer: Self-pay | Admitting: Internal Medicine

## 2023-11-27 DIAGNOSIS — J449 Chronic obstructive pulmonary disease, unspecified: Secondary | ICD-10-CM

## 2023-12-02 ENCOUNTER — Ambulatory Visit
Admission: RE | Admit: 2023-12-02 | Discharge: 2023-12-02 | Disposition: A | Discharge status: Home / Self Care | Payer: Medicare Other | Source: Ambulatory Visit | Attending: Internal Medicine | Admitting: Internal Medicine

## 2023-12-02 DIAGNOSIS — J90 Pleural effusion, not elsewhere classified: Secondary | ICD-10-CM

## 2023-12-02 DIAGNOSIS — R0782 Intercostal pain: Secondary | ICD-10-CM | POA: Diagnosis not present

## 2023-12-02 MED ORDER — IOPAMIDOL (ISOVUE-300) INJECTION 61%
200.0000 mL | Freq: Once | INTRAVENOUS | Status: AC | PRN
Start: 2023-12-02 — End: 2023-12-02
  Administered 2023-12-02: 75 mL via INTRAVENOUS

## 2023-12-03 ENCOUNTER — Ambulatory Visit: Payer: Medicare Other

## 2023-12-03 VITALS — BP 111/72 | HR 88 | Temp 98.0°F | Resp 18 | Ht 61.0 in | Wt 76.2 lb

## 2023-12-03 DIAGNOSIS — M810 Age-related osteoporosis without current pathological fracture: Secondary | ICD-10-CM

## 2023-12-03 MED ORDER — ZOLEDRONIC ACID 5 MG/100ML IV SOLN
5.0000 mg | Freq: Once | INTRAVENOUS | Status: AC
Start: 2023-12-03 — End: 2023-12-03
  Administered 2023-12-03: 5 mg via INTRAVENOUS
  Filled 2023-12-03: qty 100

## 2023-12-03 MED ORDER — SODIUM CHLORIDE 0.9 % IV SOLN: INTRAVENOUS | Status: DC

## 2023-12-03 MED ORDER — DIPHENHYDRAMINE HCL 25 MG PO CAPS
25.0000 mg | ORAL_CAPSULE | Freq: Once | ORAL | Status: AC
  Administered 2023-12-03: 25 mg via ORAL
  Filled 2023-12-03: qty 1

## 2023-12-03 MED ORDER — ACETAMINOPHEN 325 MG PO TABS
650.0000 mg | ORAL_TABLET | Freq: Once | ORAL | Status: AC
  Administered 2023-12-03: 650 mg via ORAL
  Filled 2023-12-03: qty 2

## 2023-12-03 NOTE — Patient Instructions (Signed)

## 2023-12-03 NOTE — Progress Notes (Signed)
Diagnosis: Osteoporosis  Provider:  Chilton Greathouse MD  Procedure: IV Infusion  IV Type: Peripheral, IV Location: L Forearm  Reclast (Zolendronic Acid), Dose: 5 mg  Infusion Start Time: 1459  Infusion Stop Time: 1527  Post Infusion IV Care: Observation period completed and Peripheral IV Discontinued  Discharge: Condition: Good, Destination: Home . AVS Provided  Performed by:  Adriana Mccallum, RN

## 2023-12-04 ENCOUNTER — Other Ambulatory Visit: Payer: Self-pay | Admitting: Rheumatology

## 2023-12-04 NOTE — Telephone Encounter (Signed)
Last Fill: 07/21/2023  Labs: 11/10/2023 Absolute monocytes remain borderline elevated. Rest of CBC WNL.   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

## 2023-12-08 ENCOUNTER — Other Ambulatory Visit: Payer: Self-pay | Admitting: *Deleted

## 2023-12-08 DIAGNOSIS — D472 Monoclonal gammopathy: Secondary | ICD-10-CM

## 2023-12-09 ENCOUNTER — Inpatient Hospital Stay: Payer: Medicare Other | Attending: Internal Medicine

## 2023-12-09 DIAGNOSIS — D472 Monoclonal gammopathy: Secondary | ICD-10-CM | POA: Diagnosis not present

## 2023-12-09 DIAGNOSIS — Z79899 Other long term (current) drug therapy: Secondary | ICD-10-CM | POA: Insufficient documentation

## 2023-12-09 DIAGNOSIS — F17210 Nicotine dependence, cigarettes, uncomplicated: Secondary | ICD-10-CM | POA: Insufficient documentation

## 2023-12-09 LAB — CBC WITH DIFFERENTIAL (CANCER CENTER ONLY)
Abs Immature Granulocytes: 0.03 10*3/uL (ref 0.00–0.07)
Basophils Absolute: 0.1 10*3/uL (ref 0.0–0.1)
Basophils Relative: 1 %
Eosinophils Absolute: 0.2 10*3/uL (ref 0.0–0.5)
Eosinophils Relative: 2 %
HCT: 45.9 % (ref 36.0–46.0)
Hemoglobin: 14.9 g/dL (ref 12.0–15.0)
Immature Granulocytes: 0 %
Lymphocytes Relative: 24 %
Lymphs Abs: 3 10*3/uL (ref 0.7–4.0)
MCH: 30.3 pg (ref 26.0–34.0)
MCHC: 32.5 g/dL (ref 30.0–36.0)
MCV: 93.3 fL (ref 80.0–100.0)
Monocytes Absolute: 0.9 10*3/uL (ref 0.1–1.0)
Monocytes Relative: 8 %
Neutro Abs: 7.9 10*3/uL — ABNORMAL HIGH (ref 1.7–7.7)
Neutrophils Relative %: 65 %
Platelet Count: 285 10*3/uL (ref 150–400)
RBC: 4.92 MIL/uL (ref 3.87–5.11)
RDW: 14.2 % (ref 11.5–15.5)
WBC Count: 12.1 10*3/uL — ABNORMAL HIGH (ref 4.0–10.5)
nRBC: 0 % (ref 0.0–0.2)

## 2023-12-09 LAB — LACTATE DEHYDROGENASE: LDH: 252 U/L — ABNORMAL HIGH (ref 98–192)

## 2023-12-09 LAB — CMP (CANCER CENTER ONLY)
ALT: 13 U/L (ref 0–44)
AST: 23 U/L (ref 15–41)
Albumin: 4 g/dL (ref 3.5–5.0)
Alkaline Phosphatase: 124 U/L (ref 38–126)
Anion gap: 7 (ref 5–15)
BUN: 20 mg/dL (ref 8–23)
CO2: 26 mmol/L (ref 22–32)
Calcium: 9.2 mg/dL (ref 8.9–10.3)
Chloride: 104 mmol/L (ref 98–111)
Creatinine: 0.65 mg/dL (ref 0.44–1.00)
GFR, Estimated: >60 mL/min (ref >60)
Glucose, Bld: 86 mg/dL (ref 70–99)
Potassium: 4.2 mmol/L (ref 3.5–5.1)
Sodium: 137 mmol/L (ref 135–145)
Total Bilirubin: 0.4 mg/dL (ref <1.2)
Total Protein: 8.2 g/dL — ABNORMAL HIGH (ref 6.5–8.1)

## 2023-12-11 LAB — KAPPA/LAMBDA LIGHT CHAINS
Kappa free light chain: 55.1 mg/L — ABNORMAL HIGH (ref 3.3–19.4)
Kappa, lambda light chain ratio: 2.43 — ABNORMAL HIGH (ref 0.26–1.65)
Lambda free light chains: 22.7 mg/L (ref 5.7–26.3)

## 2023-12-14 ENCOUNTER — Other Ambulatory Visit: Payer: Self-pay

## 2023-12-14 ENCOUNTER — Encounter: Payer: Self-pay | Admitting: Internal Medicine

## 2023-12-14 ENCOUNTER — Telehealth: Payer: Self-pay | Admitting: Internal Medicine

## 2023-12-14 MED ORDER — CLONAZEPAM 0.5 MG PO TABS: 0.5000 mg | ORAL_TABLET | Freq: Two times a day (BID) | ORAL | 2 refills | Status: DC | PRN

## 2023-12-14 NOTE — Telephone Encounter (Signed)
Prescription Request  12/14/2023  LOV: 11/17/2023  What is the name of the medication or equipment? Clonazapam, famotodine and ondesetron  Have you contacted your pharmacy to request a refill? Yes   Which pharmacy would you like this sent to?   [id number] [place], [place] - [address] [place] [address] Phone: [phone] Fax: [phone]   Patient notified that their request is being sent to the clinical staff for review and that they should receive a response within 2 business days.   Please advise at Mobile [phone] (mobile)

## 2023-12-14 NOTE — Telephone Encounter (Signed)
Medication refill has been Sent to Dr. Yetta Barre

## 2023-12-16 ENCOUNTER — Other Ambulatory Visit: Payer: Self-pay | Admitting: Hematology and Oncology

## 2023-12-16 ENCOUNTER — Other Ambulatory Visit: Payer: Self-pay | Admitting: Internal Medicine

## 2023-12-16 ENCOUNTER — Inpatient Hospital Stay: Payer: Medicare Other | Admitting: Hematology and Oncology

## 2023-12-16 VITALS — BP 96/49 | HR 87 | Temp 97.4°F | Resp 16 | Ht 61.0 in | Wt 76.9 lb

## 2023-12-16 DIAGNOSIS — D472 Monoclonal gammopathy: Secondary | ICD-10-CM

## 2023-12-16 DIAGNOSIS — F17210 Nicotine dependence, cigarettes, uncomplicated: Secondary | ICD-10-CM | POA: Diagnosis not present

## 2023-12-16 DIAGNOSIS — Z79899 Other long term (current) drug therapy: Secondary | ICD-10-CM | POA: Diagnosis not present

## 2023-12-16 DIAGNOSIS — M0579 Rheumatoid arthritis with rheumatoid factor of multiple sites without organ or systems involvement: Secondary | ICD-10-CM

## 2023-12-16 NOTE — Progress Notes (Signed)
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 Found during rheumatologist evaluation for RA.  2) 10/12/2020: establish care with Dr. Leonides Schanz. M protein 0.6, IgM kappa specificity. Kappa 49.3, Lambda 20.3, ratio 2.43.   3) 10/24/2020: Bone marrow biopsy revealed normocellular marrow with polytypic plasmacytosis (5%).   Interval History:  Abigail Lamb 72 y.o. female with medical history significant for IgM kappa monoclonal gammopathy who presents for a follow up visit. The patient's last visit was on 06/17/2023. In the interim since the last visit she has had no major changes in her health.  On exam today Abigail Lamb reports she is beginning to feel much better with an increase in her energy levels.  She reports that she still has some trouble walking due to pain in her toes from osteoarthritis.  She reports that she is able to walk outside more easily when there are rails or good sidewalks with level ground.  She reports that because of the RA in her hands and wrist she does rely on other people to open up objects.  She reports that she is not currently on any steroid therapy though she did recently have a virus which consisted of nausea and congestion as well as nasal drainage.  She reports that she was put on a cephalosporin antibiotic which helped to clear it up.  She reports that she is well other than her rheumatological issues..  She is not having any fevers, chills, sweats.  She denies any bone or back pain at this time.  A full 10 point ROS is listed below.  MEDICAL HISTORY:  Past Medical History:  Diagnosis Date   Celiac disease    GERD (gastroesophageal reflux disease)    MGUS (monoclonal gammopathy  of unknown significance)    per patient    Osteoporosis    per patient, diagnosed by Dr. Darral Dash in [facility]   PUD (peptic ulcer disease)    Pulmonary emphysema (HCC)    Recurrent depression (HCC)     SURGICAL HISTORY: Past Surgical History:  Procedure Laterality Date   ABCESS DRAINAGE Left    under left arm   TONSILLECTOMY  1977    SOCIAL HISTORY: Social History   Socioeconomic History   Marital status: Divorced    Spouse name: Not on file   Number of children: Not on file   Years of education: Not on file   Highest education level: Some college, no degree  Occupational History   Not on file  Tobacco Use   Smoking status: Every Day    Current packs/day: 0.75    Average packs/day: 0.8 packs/day for 49.0 years (36.8 ttl pk-yrs)    Types: Cigarettes    Passive exposure: Current   Smokeless tobacco: Never  Vaping Use   Vaping status: Never Used  Substance and Sexual Activity   Alcohol use: Not Currently   Drug use: Never   Sexual activity: Not Currently    Partners: Male  Other Topics Concern   Not on file  Social History Narrative   Not on file   Social Drivers of Health   Financial Resource Strain: Medium Risk (11/17/2023)   Overall Financial Resource Strain (CARDIA)    Difficulty of Paying Living Expenses: Somewhat hard  Food Insecurity: Food  Insecurity Present (11/17/2023)   Hunger Vital Sign    Worried About Running Out of Food in the Last Year: Sometimes true    Ran Out of Food in the Last Year: Sometimes true  Transportation Needs: Unmet Transportation Needs (11/17/2023)   PRAPARE - Administrator, Civil Service (Medical): Yes    Lack of Transportation (Non-Medical): No  Physical Activity: Unknown (11/17/2023)   Exercise Vital Sign    Days of Exercise per Week: 0 days    Minutes of Exercise per Session: Patient declined  Stress: Stress Concern Present (11/17/2023)   [employer] of Occupational Health - Occupational Stress  Questionnaire    Feeling of Stress : Rather much  Social Connections: Moderately Isolated (11/17/2023)   Social Connection and Isolation Panel [NHANES]    Frequency of Communication with Friends and Family: More than three times a week    Frequency of Social Gatherings with Friends and Family: More than three times a week    Attends Religious Services: 1 to 4 times per year    Active Member of [employer] or Organizations: No    Attends Banker Meetings: Patient declined    Marital Status: Widowed  Intimate Partner Violence: Not At Risk (03/19/2023)   Humiliation, Afraid, Rape, and Kick questionnaire    Fear of Current or Ex-Partner: No    Emotionally Abused: No    Physically Abused: No    Sexually Abused: No    FAMILY HISTORY: Family History  Problem Relation Age of Onset   CVA Mother    CAD Mother    Diabetes Mother    COPD Mother    Fibromyalgia Mother    Depression Mother    Hypertension Mother    Emphysema Mother        smoked   Heart attack Mother    Prostate cancer Father    Cancer - Prostate Father    Rheum arthritis Sister    Skin cancer Brother    Kidney Stones Brother    Hernia Brother    Depression Daughter    Healthy Son    Healthy Son     ALLERGIES:  is allergic to dust mite extract.  MEDICATIONS:  Current Outpatient Medications  Medication Sig Dispense Refill   albuterol (VENTOLIN HFA) 108 (90 Base) MCG/ACT inhaler Take 1-2 puffs every 4-6 hrs prn shortness of breath. Needs ventolin.     ANORO ELLIPTA 62.5-25 MCG/ACT AEPB TAKE 1 PUFF BY MOUTH EVERY DAY 120 each 0   celecoxib (CELEBREX) 50 MG capsule TAKE 1 CAPSULE BY MOUTH 2 TIMES DAILY. 60 capsule 1   Cholecalciferol (VITAMIN D3) 125 MCG (5000 UT) CAPS Take 5,000 Units by mouth daily.     clindamycin (CLEOCIN T) 1 % external solution as needed.      clobetasol (OLUX) 0.05 % topical foam Apply topically as needed.     clonazePAM (KLONOPIN) 0.5 MG tablet Take 1 tablet (0.5 mg total) by mouth 2  (two) times daily as needed for anxiety. 60 tablet 2   co-enzyme Q-10 30 MG capsule Take by mouth.     colesevelam (WELCHOL) 625 MG tablet TAKE 3 TABLETS BY MOUTH TWICE A DAY WITH MEAL 180 tablet 0   dicyclomine (BENTYL) 10 MG capsule TAKE 1 CAPSULE (10 MG TOTAL) BY MOUTH 4 TIMES A DAY BEFORE MEALS AND AT BEDTIME 360 capsule 0   DULoxetine (CYMBALTA) 30 MG capsule Take 3 capsules (90 mg total) by mouth daily. For total of 90 mg  daily (Patient taking differently: Take 30 mg by mouth at bedtime. For total of 90 mg daily) 270 capsule 0   DULoxetine (CYMBALTA) 60 MG capsule Take 60 mg by mouth daily.     famotidine (PEPCID) 40 MG tablet TAKE 1 TABLET BY MOUTH TWICE A DAY 180 tablet 1   fluocinonide-emollient (LIDEX-E) 0.05 % cream Apply 1 application topically 2 (two) times daily. 60 g 3   fluticasone (FLONASE) 50 MCG/ACT nasal spray Place 1 spray into both nostrils daily. (Patient taking differently: Place 1 spray into both nostrils as needed.) 16 g 11   folic acid (FOLVITE) 1 MG tablet Take by mouth.     hydroxychloroquine (PLAQUENIL) 200 MG tablet TAKE 1 TABLET BY MOUTH TWICE A DAY 180 tablet 0   hydrOXYzine (ATARAX) 10 MG tablet TAKE 1 TABLET BY MOUTH EVERY 8 HOURS AS NEEDED 270 tablet 0   ondansetron (ZOFRAN) 4 MG tablet TAKE 1 TABLET BY MOUTH EVERY 8 HOURS AS NEEDED FOR NAUSEA AND VOMITING 40 tablet 2   sulfaSALAzine (AZULFIDINE) 500 MG tablet TAKE 2 TABLETS BY MOUTH TWICE A DAY 120 tablet 2   No current facility-administered medications for this visit.    REVIEW OF SYSTEMS:   Constitutional: ( - ) fevers, ( - )  chills , ( - ) night sweats Eyes: ( - ) blurriness of vision, ( - ) double vision, ( - ) watery eyes Ears, nose, mouth, throat, and face: ( - ) mucositis, ( - ) sore throat Respiratory: ( - ) cough, ( - ) dyspnea, ( - ) wheezes Cardiovascular: ( - ) palpitation, ( - ) chest discomfort, ( - ) lower extremity swelling Gastrointestinal:  ( - ) nausea, ( - ) heartburn, ( - ) change in  bowel habits Skin: ( - ) abnormal skin rashes Lymphatics: ( - ) new lymphadenopathy, ( - ) easy bruising Neurological: ( - ) numbness, ( - ) tingling, ( - ) new weaknesses Behavioral/Psych: ( - ) mood change, ( - ) new changes  All other systems were reviewed with the patient and are negative.  PHYSICAL EXAMINATION: ECOG PERFORMANCE STATUS: 1 - Symptomatic but completely ambulatory  Vitals:   12/16/23 1507 12/16/23 1509  BP: (!) 117/58 (!) 96/49  Pulse: 87   Resp: 16   Temp: (!) 97.4 F (36.3 C)   SpO2: 95%       Filed Weights   12/16/23 1507  Weight: 76 lb 14.4 oz (34.9 kg)       GENERAL: well appearing elderly Caucasian female in NAD  SKIN: skin color, texture, turgor are normal, no rashes or significant lesions EYES: conjunctiva are pink and non-injected, sclera clear LUNGS: clear to auscultation and percussion with normal breathing effort HEART: regular rate & rhythm and no murmurs and no lower extremity edema Musculoskeletal: no cyanosis of digits and no clubbing. Ulnar deviation of fingers bilaterally   PSYCH: alert & oriented x 3, fluent speech NEURO: no focal motor/sensory deficits  LABORATORY DATA:  I have reviewed the data as listed    Latest Ref Rng & Units 12/09/2023    1:23 PM 11/10/2023    9:26 AM 08/27/2023    3:56 PM  CBC  WBC 4.0 - 10.5 K/uL 12.1  10.6  10.6   Hemoglobin 12.0 - 15.0 g/dL 40.9  81.1  91.4   Hematocrit 36.0 - 46.0 % 45.9  43.3  39.9   Platelets 150 - 400 K/uL 285  308  265  Latest Ref Rng & Units 12/09/2023    1:23 PM 11/10/2023    9:26 AM 08/27/2023    3:56 PM  CMP  Glucose 70 - 99 mg/dL 86  90  92   BUN 8 - 23 mg/dL 20  21  21    Creatinine 0.44 - 1.00 mg/dL 4.09  8.11  9.14   Sodium 135 - 145 mmol/L 137  138  138   Potassium 3.5 - 5.1 mmol/L 4.2  4.1  4.6   Chloride 98 - 111 mmol/L 104  102  102   CO2 22 - 32 mmol/L 26  28  28    Calcium 8.9 - 10.3 mg/dL 9.2  9.4  9.8   Total Protein 6.5 - 8.1 g/dL 8.2  7.0  7.0    Total Bilirubin <1.2 mg/dL 0.4  0.2  0.3   Alkaline Phos 38 - 126 U/L 124     AST 15 - 41 U/L 23  26  25    ALT 0 - 44 U/L 13  14  16      Lab Results  Component Value Date   MPROTEIN 1.0 (H) 12/09/2023   MPROTEIN 0.9 (H) 06/05/2023   MPROTEIN 1.1 (H) 11/28/2022   Lab Results  Component Value Date   KPAFRELGTCHN 55.1 (H) 12/09/2023   KPAFRELGTCHN 63.4 (H) 06/05/2023   KPAFRELGTCHN 71.9 (H) 11/28/2022   LAMBDASER 22.7 12/09/2023   LAMBDASER 24.9 06/05/2023   LAMBDASER 26.4 (H) 11/28/2022   KAPLAMBRATIO 2.43 (H) 12/09/2023   KAPLAMBRATIO 2.55 (H) 06/05/2023   KAPLAMBRATIO 2.72 (H) 11/28/2022    RADIOGRAPHIC STUDIES: CT Chest W Contrast Result Date: 12/12/2023 CLINICAL DATA:  Chest pain, history of emphysema EXAM: CT CHEST WITH CONTRAST TECHNIQUE: Multidetector CT imaging of the chest was performed during intravenous contrast administration. RADIATION DOSE REDUCTION: This exam was performed according to the departmental dose-optimization program which includes automated exposure control, adjustment of the mA and/or kV according to patient size and/or use of iterative reconstruction technique. CONTRAST:  75mL ISOVUE-300 IOPAMIDOL (ISOVUE-300) INJECTION 61% COMPARISON:  07/02/2022 FINDINGS: Cardiovascular: Heart size normal. No pericardial effusion. Central great vessels grossly unremarkable. Scattered calcified plaque in the aortic arch and descending thoracic aorta. 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 Mediastinum/Nodes: No mass or adenopathy. Lungs/Pleura: No pleural effusion. Pulmonary emphysema with chronic low interval linear scarring posteriorly at both lung bases. The 4 mm nodule previously seen in the medial right lower lobe has resolved. 5 mm nodule superior segment right lower lobe (Im76,Se5) , stable since 12/19/2021. No new nodule or infiltrate. Upper Abdomen: Stable left adrenal enlargement. 3 mm calculus upper pole left renal collecting  system without hydronephrosis. No acute findings. Musculoskeletal: No chest wall abnormality. No acute or significant osseous findings. IMPRESSION: 1. No acute findings. 2. 5 mm right lower lobe nodule, stable since 12/19/2021. 3. 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 4. Left nephrolithiasis. 5. Aortic Atherosclerosis (ICD10-I70.0) and Emphysema (ICD10-J43.9). Electronically Signed   By: Corlis Leak M.D.   On: 12/12/2023 13:24    ASSESSMENT & PLAN Abigail Lamb 72 y.o. female with medical history significant for IgM kappa monoclonal gammopathy who presents for a follow up visit.    After review the labs, review the records, discussion the patient the findings are most consistent with a monoclonal myopathy of undetermined significance.  She underwent bone marrow biopsy which did show a 5% plasma cell population, however this was polytypic in nature.  As such the patient does not require any treatment but  only observation from this point moving forward.  The only missing piece of her work-up at this time is her metastatic survey which she requested be performed after December 29, 2020 (but was lost to follow up).  Overall she is at her baseline level of health at this time with no other issues that need addressed.  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 No CRAB criteria met. Bone marrow biopsy on 10/24/2020 revealed normocellular marrow with polytypic plasmacytosis (5%).  --assure yearly metastatic survey and UPEP. Testing due in Dec 2024.  --assure SPEP, SFLC, CMP, CBC, and LDH at each visit --labs today show white blood cell 12.1, hemoglobin 14.9, MCV 93.3, platelets 285. --RTC q 6 months with labs 1 week before   Orders Placed This Encounter  Procedures   DG Bone Survey Met    Standing Status:   Future    Expected Date:   12/27/2023    Expiration Date:   12/19/2024    Reason for Exam (SYMPTOM  OR DIAGNOSIS REQUIRED):    MGUS, assess for lytic lesions    Preferred imaging location?:   [facility]   All questions were answered. The patient knows to call the clinic with any problems, questions or concerns.  A total of more than 25 minutes were spent on this encounter and over half of that time was spent on counseling and coordination of care as outlined above.   Ulysees Barns, MD Department of Hematology/Oncology [facility] Phone: [phone] Pager: [phone] Email: [email or web address]  12/20/2023 7:23 PM    Literature Support:  1) Kyle RA, Durie BG, Rajkumar SV, et al. Monoclonal gammopathy of undetermined significance (MGUS) and smoldering (asymptomatic) multiple myeloma: IMWG consensus perspectives risk factors for progression and guidelines for monitoring and management. Leukemia. 2010;24(6):1121-1127. doi:10.1038/leu.2010.60   --If a patient with apparent MGUS has a serum monoclonal protein >15 g/l, IgA or IgM protein type, or an abnormal FLC ratio, a BM aspirate and biopsy should be carried out at baseline to rule out underlying PC malignancy.   2) Ronaldo Miyamoto RA, Therneau TM, Rajkumar SV, et al. Prevalence of monoclonal gammopathy of undetermined significance. N Engl J Med 2006;354:1362-1369   --MGUS was found in 3.2 percent of persons 71 years of age or older and 5.3 percent of persons 83 years of age or older.

## 2023-12-18 LAB — MULTIPLE MYELOMA PANEL, SERUM
Albumin SerPl Elph-Mcnc: 3.7 g/dL (ref 2.9–4.4)
Albumin/Glob SerPl: 1 (ref 0.7–1.7)
Alpha 1: 0.3 g/dL (ref 0.0–0.4)
Alpha2 Glob SerPl Elph-Mcnc: 0.7 g/dL (ref 0.4–1.0)
B-Globulin SerPl Elph-Mcnc: 1.2 g/dL (ref 0.7–1.3)
Gamma Glob SerPl Elph-Mcnc: 1.8 g/dL (ref 0.4–1.8)
Globulin, Total: 4 g/dL — ABNORMAL HIGH (ref 2.2–3.9)
IgA: 591 mg/dL — ABNORMAL HIGH (ref 64–422)
IgG (Immunoglobin G), Serum: 1115 mg/dL (ref 586–1602)
IgM (Immunoglobulin M), Srm: 1094 mg/dL — ABNORMAL HIGH (ref 26–217)
M Protein SerPl Elph-Mcnc: 1 g/dL — ABNORMAL HIGH
Total Protein ELP: 7.7 g/dL (ref 6.0–8.5)

## 2023-12-20 ENCOUNTER — Encounter: Payer: Self-pay | Admitting: Physician Assistant

## 2023-12-23 ENCOUNTER — Other Ambulatory Visit: Payer: Self-pay | Admitting: Internal Medicine

## 2023-12-23 DIAGNOSIS — E785 Hyperlipidemia, unspecified: Secondary | ICD-10-CM

## 2023-12-25 ENCOUNTER — Ambulatory Visit (HOSPITAL_COMMUNITY)
Admission: RE | Admit: 2023-12-25 | Discharge: 2023-12-25 | Disposition: A | Discharge status: Home / Self Care | Payer: Medicare Other | Source: Ambulatory Visit | Attending: Hematology and Oncology | Admitting: Hematology and Oncology

## 2023-12-25 DIAGNOSIS — D472 Monoclonal gammopathy: Secondary | ICD-10-CM | POA: Diagnosis not present

## 2023-12-25 DIAGNOSIS — M47816 Spondylosis without myelopathy or radiculopathy, lumbar region: Secondary | ICD-10-CM | POA: Diagnosis not present

## 2023-12-28 DIAGNOSIS — H524 Presbyopia: Secondary | ICD-10-CM | POA: Diagnosis not present

## 2023-12-28 DIAGNOSIS — H2513 Age-related nuclear cataract, bilateral: Secondary | ICD-10-CM | POA: Diagnosis not present

## 2023-12-28 DIAGNOSIS — Z79899 Other long term (current) drug therapy: Secondary | ICD-10-CM | POA: Diagnosis not present

## 2024-01-13 DIAGNOSIS — Z79899 Other long term (current) drug therapy: Secondary | ICD-10-CM | POA: Diagnosis not present

## 2024-01-14 ENCOUNTER — Encounter: Payer: Self-pay | Admitting: Emergency Medicine

## 2024-01-14 ENCOUNTER — Ambulatory Visit: Payer: Medicare Other | Admitting: Emergency Medicine

## 2024-01-14 VITALS — BP 108/60 | HR 70 | Ht 61.5 in | Wt 80.6 lb

## 2024-01-14 DIAGNOSIS — J449 Chronic obstructive pulmonary disease, unspecified: Secondary | ICD-10-CM

## 2024-01-14 DIAGNOSIS — Z720 Tobacco use: Secondary | ICD-10-CM

## 2024-01-14 DIAGNOSIS — R911 Solitary pulmonary nodule: Secondary | ICD-10-CM | POA: Diagnosis not present

## 2024-01-14 NOTE — Assessment & Plan Note (Signed)
Unfortunately still smoking half a pack daily.  She has been on Chantix before, did benefit but does not want to go back on it right now.  Talked to her about cessation and she is going to continue to work on it.

## 2024-01-14 NOTE — Assessment & Plan Note (Signed)
She is averaging 1 exacerbation annually.  Could consider adding an ICS she is tolerating well.  Continue as needed.  Did speak to her about tobacco cessation  Please continue Anoro once daily. Keep your albuterol available to use 2 puffs when needed for shortness of breath, chest tightness, wheezing. Follow-up with APP in 6 months Follow Dr. Delton Coombes in 12 months or sooner if you have any problems.

## 2024-01-14 NOTE — Patient Instructions (Signed)
Please continue Anoro once daily. Keep your albuterol available to use 2 puffs when needed for shortness of breath, chest tightness, wheezing. Continue work on decreasing your cigarette smoking. We reviewed your CT scan of the chest today.  Your pulmonary nodule stable in size and can be deemed to be benign.  Good news. We will refer you to the lung cancer screening program.  Your first screening CT scan will probably be in December 2025. Follow-up with APP in 6 months Follow Dr. Delton Coombes in 12 months or sooner if you have any problems.

## 2024-01-14 NOTE — Progress Notes (Signed)
Subjective:    Patient ID: Abigail Lamb, female    DOB: [birth year], 73 y.o.   MRN: [id number]  HPI 73 year old active smoker (approximately 75 pack years) with a history of COPD/emphysema, MGUS, rheumatoid arthritis on immunosuppression (previously methotrexate, stopped in 2021), GERD, celiac disease.  She has been seen by Dr. Thora Lance in our office, last on 12/11/2022. Has been managed on Anoro, albuterol as needed. Now off Chantix. Albuterol about 1/2 pk/day She had a URI about 2 months ago, was treated w abx at that time. She still has some residual cough since then. She states she flares about once a year. She has some cough every day, occasionally productive.  She has nasal congestion, some exertional SOB ut she feels better than last year, a bit stronger.   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    Review of Systems As per HPI  Past Medical History:  Diagnosis Date   Celiac disease    GERD (gastroesophageal reflux disease)    MGUS (monoclonal gammopathy of unknown significance)    per patient    Osteoporosis    per patient, diagnosed by Dr. Darral Dash in [facility]   PUD (peptic ulcer disease)    Pulmonary emphysema (HCC)    Recurrent depression (HCC)      Family History  Problem Relation Age of Onset   CVA Mother    CAD Mother    Diabetes Mother    COPD Mother    Fibromyalgia Mother    Depression Mother    Hypertension Mother    Emphysema Mother        smoked   Heart attack Mother    Prostate cancer Father    Cancer - Prostate Father    Rheum arthritis Sister    Skin cancer Brother    Kidney Stones Brother    Hernia Brother    Depression Daughter    Healthy Son    Healthy Son      Social History   Socioeconomic History   Marital status: Divorced    Spouse name: Not on file   Number of children: Not on file    Years of education: Not on file   Highest education level: Some college, no degree  Occupational History   Not on file  Tobacco Use   Smoking status: Every Day    Current packs/day: 0.75    Average packs/day: 0.8 packs/day for 49.0 years (36.8 ttl pk-yrs)    Types: Cigarettes    Passive exposure: Current   Smokeless tobacco: Never  Vaping Use   Vaping status: Never Used  Substance and Sexual Activity   Alcohol use: Not Currently   Drug use: Never   Sexual activity: Not Currently    Partners: Male  Other Topics Concern   Not on file  Social History Narrative   Not on file   Social Drivers of Health   Financial Resource Strain: Medium Risk (11/17/2023)   Overall Financial Resource Strain (CARDIA)    Difficulty of Paying Living Expenses: Somewhat hard  Food Insecurity: Food Insecurity Present (11/17/2023)   Hunger Vital Sign    Worried About Running Out of Food in the Last Year: Sometimes true    Ran Out of Food in the Last Year: Sometimes true  Transportation Needs: Unmet Transportation Needs (11/17/2023)   PRAPARE - Transportation    Lack  of Transportation (Medical): Yes    Lack of Transportation (Non-Medical): No  Physical Activity: Unknown (11/17/2023)   Exercise Vital Sign    Days of Exercise per Week: 0 days    Minutes of Exercise per Session: Patient declined  Stress: Stress Concern Present (11/17/2023)   [employer] of Occupational Health - Occupational Stress Questionnaire    Feeling of Stress : Rather much  Social Connections: Moderately Isolated (11/17/2023)   Social Connection and Isolation Panel [NHANES]    Frequency of Communication with Friends and Family: More than three times a week    Frequency of Social Gatherings with Friends and Family: More than three times a week    Attends Religious Services: 1 to 4 times per year    Active Member of [employer] or Organizations: No    Attends Banker Meetings: Patient declined    Marital Status:  Widowed  Intimate Partner Violence: Not At Risk (03/19/2023)   Humiliation, Afraid, Rape, and Kick questionnaire    Fear of Current or Ex-Partner: No    Emotionally Abused: No    Physically Abused: No    Sexually Abused: No     Allergies  Allergen Reactions   Dust Mite Extract     Multiple environmental allergies.      Outpatient Medications Prior to Visit  Medication Sig Dispense Refill   albuterol (VENTOLIN HFA) 108 (90 Base) MCG/ACT inhaler Take 1-2 puffs every 4-6 hrs prn shortness of breath. Needs ventolin.     ANORO ELLIPTA 62.5-25 MCG/ACT AEPB TAKE 1 PUFF BY MOUTH EVERY DAY 120 each 0   celecoxib (CELEBREX) 50 MG capsule TAKE 1 CAPSULE BY MOUTH 2 TIMES DAILY. 60 capsule 1   Cholecalciferol (VITAMIN D3) 125 MCG (5000 UT) CAPS Take 5,000 Units by mouth daily.     clindamycin (CLEOCIN T) 1 % external solution as needed.      clobetasol (OLUX) 0.05 % topical foam Apply topically as needed.     clonazePAM (KLONOPIN) 0.5 MG tablet Take 1 tablet (0.5 mg total) by mouth 2 (two) times daily as needed for anxiety. 60 tablet 2   co-enzyme Q-10 30 MG capsule Take by mouth.     colesevelam (WELCHOL) 625 MG tablet TAKE 3 TABLETS BY MOUTH TWICE A DAY WITH MEAL 180 tablet 3   dicyclomine (BENTYL) 10 MG capsule TAKE 1 CAPSULE (10 MG TOTAL) BY MOUTH 4 TIMES A DAY BEFORE MEALS AND AT BEDTIME 360 capsule 0   DULoxetine (CYMBALTA) 30 MG capsule Take 3 capsules (90 mg total) by mouth daily. For total of 90 mg daily (Patient taking differently: Take 30 mg by mouth at bedtime. For total of 90 mg daily) 270 capsule 0   DULoxetine (CYMBALTA) 60 MG capsule Take 60 mg by mouth daily.     famotidine (PEPCID) 40 MG tablet TAKE 1 TABLET BY MOUTH TWICE A DAY 180 tablet 1   fluocinonide-emollient (LIDEX-E) 0.05 % cream Apply 1 application topically 2 (two) times daily. 60 g 3   fluticasone (FLONASE) 50 MCG/ACT nasal spray Place 1 spray into both nostrils daily. (Patient taking differently: Place 1 spray into  both nostrils as needed.) 16 g 11   folic acid (FOLVITE) 1 MG tablet Take by mouth.     hydroxychloroquine (PLAQUENIL) 200 MG tablet TAKE 1 TABLET BY MOUTH TWICE A DAY 180 tablet 0   hydrOXYzine (ATARAX) 10 MG tablet TAKE 1 TABLET BY MOUTH EVERY 8 HOURS AS NEEDED 270 tablet 0   ondansetron (ZOFRAN) 4  MG tablet TAKE 1 TABLET BY MOUTH EVERY 8 HOURS AS NEEDED FOR NAUSEA AND VOMITING 40 tablet 2   sulfaSALAzine (AZULFIDINE) 500 MG tablet TAKE 2 TABLETS BY MOUTH TWICE A DAY 120 tablet 2   No facility-administered medications prior to visit.         Objective:   Physical Exam Vitals:   01/14/24 1348  BP: 108/60  Pulse: 70  SpO2: 96%  Weight: 80 lb 9.6 oz (36.6 kg)  Height: 5' 1.5" (1.562 m)   Gen: Pleasant, very thin, in no distress,  normal affect  ENT: No lesions,  mouth clear,  oropharynx clear, no postnasal drip  Neck: No JVD, no stridor  Lungs: No use of accessory muscles, distant, no crackles or wheezing on normal respiration, no wheeze on forced expiration  Cardiovascular: RRR, heart sounds normal, no murmur or gallops, no peripheral edema  Musculoskeletal: some hand joint deformities.   Neuro: alert, awake, non focal  Skin: Warm, no lesions or rash        Assessment & Plan:   GOLD C COPD by GOLD classification (HCC) She is averaging 1 exacerbation annually.  Could consider adding an ICS she is tolerating well.  Continue as needed.  Did speak to her about tobacco cessation  Please continue Anoro once daily. Keep your albuterol available to use 2 puffs when needed for shortness of breath, chest tightness, wheezing. Follow-up with APP in 6 months Follow Dr. Delton Coombes in 12 months or sooner if you have any problems.  Lung nodule 2 years of stability documented on her most recent CT chest.  I think she can transition over into the lung cancer screening program and I will make that referral today  Tobacco abuse Unfortunately still smoking half a pack daily.  She has  been on Chantix before, did benefit but does not want to go back on it right now.  Talked to her about cessation and she is going to continue to work on it.   Levy Pupa, MD, PhD 01/14/2024, 2:11 PM [facility] [phone] or if no answer before 7:00PM call [phone] For any issues after 7:00PM please call eLink [phone]

## 2024-01-14 NOTE — Assessment & Plan Note (Signed)
2 years of stability documented on her most recent CT chest.  I think she can transition over into the lung cancer screening program and I will make that referral today

## 2024-01-18 ENCOUNTER — Other Ambulatory Visit: Payer: Self-pay | Admitting: Physician Assistant

## 2024-01-18 DIAGNOSIS — M0579 Rheumatoid arthritis with rheumatoid factor of multiple sites without organ or systems involvement: Secondary | ICD-10-CM

## 2024-01-21 ENCOUNTER — Ambulatory Visit: Payer: Medicare Other

## 2024-01-21 VITALS — Ht 61.5 in | Wt 80.0 lb

## 2024-01-21 DIAGNOSIS — Z5982 Transportation insecurity: Secondary | ICD-10-CM | POA: Diagnosis not present

## 2024-01-21 DIAGNOSIS — Z0000 Encounter for general adult medical examination without abnormal findings: Secondary | ICD-10-CM | POA: Diagnosis not present

## 2024-01-21 DIAGNOSIS — Z1211 Encounter for screening for malignant neoplasm of colon: Secondary | ICD-10-CM

## 2024-01-21 DIAGNOSIS — Z1212 Encounter for screening for malignant neoplasm of rectum: Secondary | ICD-10-CM

## 2024-01-21 DIAGNOSIS — Z780 Asymptomatic menopausal state: Secondary | ICD-10-CM | POA: Diagnosis not present

## 2024-01-21 DIAGNOSIS — Z1231 Encounter for screening mammogram for malignant neoplasm of breast: Secondary | ICD-10-CM

## 2024-01-21 NOTE — Patient Instructions (Addendum)
Abigail Lamb , Thank you for taking time to come for your Medicare Wellness Visit. I appreciate your ongoing commitment to your health goals. Please review the following plan we discussed and let me know if I can assist you in the future.   Referrals/Orders/Follow-Ups/Clinician Recommendations: It was nice talking with you today.  Each day, aim for 6 glasses of water, plenty of protein in your diet and try to get up and walk/ stretch every hour for 5-10 minutes at a time.  Please call [facility] at [address], [place], [place] [address] [phone]) to schedule a colonoscopy.  You have an order for:   [x]   3D Mammogram  [x]   Bone Density     Please call for appointment:  [facility] [address] [place] [address] [place], [place] [address] [phone]  Make sure to wear two-piece clothing.  No lotions, powders, or deodorants the day of the appointment. Make sure to bring picture ID and insurance card.  Bring list of medications you are currently taking including any supplements.   Schedule your [facility] screening mammogram through MyChart!   Log into your MyChart account.  Go to 'Visit' (or 'Appointments' if on mobile App) --> Schedule an Appointment  Under 'Select a Reason for Visit' choose the Mammogram Screening option.  Complete the pre-visit questions and select the time and place that best fits your schedule.    This is a list of the screening recommended for you and due dates:  Health Maintenance  Topic Date Due   COVID-19 Vaccine (1) Never done   Mammogram  02/08/2024   Screening for Lung Cancer  12/01/2024   Medicare Annual Wellness Visit  01/20/2025   Colon Cancer Screening  07/31/2025   DTaP/Tdap/Td vaccine (2 - Td or Tdap) 11/05/2030   Pneumonia Vaccine  Completed   Flu Shot  Completed   DEXA scan (bone density measurement)  Completed   Hepatitis C Screening  Completed   Zoster (Shingles) Vaccine  Completed   HPV  Vaccine  Aged Out    Advanced directives: (Declined) Advance directive discussed with you today. Even though you declined this today, please call our office should you change your mind, and we can give you the proper paperwork for you to fill out.  Next Medicare Annual Wellness Visit scheduled for next year: Yes

## 2024-01-21 NOTE — Addendum Note (Signed)
Addended by: Wyvonne Lenz on: 01/21/2024 04:26 PM   Modules accepted: Orders

## 2024-01-21 NOTE — Progress Notes (Signed)
Subjective:   Abigail Lamb is a 73 y.o. female who presents for Medicare Annual (Subsequent) preventive examination.  Visit Complete: Virtual I connected with  Jenel Lucks on 01/21/24 by a video and audio enabled telemedicine application and verified that I am speaking with the correct person using two identifiers.  Patient Location: Home  Provider Location: Office/Clinic  I discussed the limitations of evaluation and management by telemedicine. The patient expressed understanding and agreed to proceed.  Vital Signs: Because this visit was a virtual/telehealth visit, some criteria may be missing or patient reported. Any vitals not documented were not able to be obtained and vitals that have been documented are patient reported.  Patient Medicare AWV questionnaire was completed by the patient on 01/21/2024; I have confirmed that all information answered by patient is correct and no changes since this date.  Cardiac Risk Factors include: advanced age (>45men, >66 women);Other (see comment);dyslipidemia, Risk factor comments: COPD, Pulmonary emphysema     Objective:    Today's Vitals   01/21/24 1341  Weight: 80 lb (36.3 kg)  Height: 5' 1.5" (1.562 m)   Body mass index is 14.87 kg/m.     01/21/2024    1:56 PM 12/03/2023    2:25 PM 03/19/2023   11:12 AM 03/11/2022    2:33 PM 11/09/2020   10:38 AM 11/05/2020   11:15 AM 10/24/2020    8:03 AM  Advanced Directives  Does Patient Have a Medical Advance Directive? No No No No No No No  Would patient like information on creating a medical advance directive?  No - Patient declined No - Patient declined Yes (MAU/Ambulatory/Procedural Areas - Information given) No - Patient declined No - Patient declined No - Patient declined    Current Medications (verified) Outpatient Encounter Medications as of 01/21/2024  Medication Sig   albuterol (VENTOLIN HFA) 108 (90 Base) MCG/ACT inhaler Take 1-2 puffs every 4-6 hrs prn shortness of breath.  Needs ventolin.   ANORO ELLIPTA 62.5-25 MCG/ACT AEPB TAKE 1 PUFF BY MOUTH EVERY DAY   celecoxib (CELEBREX) 50 MG capsule TAKE 1 CAPSULE BY MOUTH 2 TIMES DAILY.   Cholecalciferol (VITAMIN D3) 125 MCG (5000 UT) CAPS Take 5,000 Units by mouth daily.   clindamycin (CLEOCIN T) 1 % external solution as needed.    clobetasol (OLUX) 0.05 % topical foam Apply topically as needed.   clonazePAM (KLONOPIN) 0.5 MG tablet Take 1 tablet (0.5 mg total) by mouth 2 (two) times daily as needed for anxiety.   co-enzyme Q-10 30 MG capsule Take by mouth.   colesevelam (WELCHOL) 625 MG tablet TAKE 3 TABLETS BY MOUTH TWICE A DAY WITH MEAL   dicyclomine (BENTYL) 10 MG capsule TAKE 1 CAPSULE (10 MG TOTAL) BY MOUTH 4 TIMES A DAY BEFORE MEALS AND AT BEDTIME   DULoxetine (CYMBALTA) 30 MG capsule Take 3 capsules (90 mg total) by mouth daily. For total of 90 mg daily (Patient taking differently: Take 30 mg by mouth at bedtime. For total of 90 mg daily)   DULoxetine (CYMBALTA) 60 MG capsule Take 60 mg by mouth daily.   fluocinonide-emollient (LIDEX-E) 0.05 % cream Apply 1 application topically 2 (two) times daily.   fluticasone (FLONASE) 50 MCG/ACT nasal spray Place 1 spray into both nostrils daily. (Patient taking differently: Place 1 spray into both nostrils as needed.)   folic acid (FOLVITE) 1 MG tablet Take by mouth.   hydroxychloroquine (PLAQUENIL) 200 MG tablet TAKE 1 TABLET BY MOUTH TWICE A DAY   hydrOXYzine (ATARAX) 10  MG tablet TAKE 1 TABLET BY MOUTH EVERY 8 HOURS AS NEEDED   ondansetron (ZOFRAN) 4 MG tablet TAKE 1 TABLET BY MOUTH EVERY 8 HOURS AS NEEDED FOR NAUSEA AND VOMITING   sulfaSALAzine (AZULFIDINE) 500 MG tablet TAKE 2 TABLETS BY MOUTH TWICE A DAY   famotidine (PEPCID) 40 MG tablet TAKE 1 TABLET BY MOUTH TWICE A DAY (Patient not taking: Reported on 01/21/2024)   No facility-administered encounter medications on file as of 01/21/2024.    Allergies (verified) Dust mite extract   History: Past Medical  History:  Diagnosis Date   Celiac disease    GERD (gastroesophageal reflux disease)    MGUS (monoclonal gammopathy of unknown significance)    per patient    Osteoporosis    per patient, diagnosed by Dr. Darral Dash in [facility]   PUD (peptic ulcer disease)    Pulmonary emphysema (HCC)    Recurrent depression (HCC)    Past Surgical History:  Procedure Laterality Date   ABCESS DRAINAGE Left    under left arm   TONSILLECTOMY  1977   Family History  Problem Relation Age of Onset   CVA Mother    CAD Mother    Diabetes Mother    COPD Mother    Fibromyalgia Mother    Depression Mother    Hypertension Mother    Emphysema Mother        smoked   Heart attack Mother    Prostate cancer Father    Cancer - Prostate Father    Rheum arthritis Sister    Skin cancer Brother    Kidney Stones Brother    Hernia Brother    Depression Daughter    Healthy Son    Healthy Son    Social History   Socioeconomic History   Marital status: Divorced    Spouse name: Not on file   Number of children: 3   Years of education: Not on file   Highest education level: Associate degree: occupational, Scientist, product/process development, or vocational program  Occupational History   Occupation: RETIRED  Tobacco Use   Smoking status: Every Day    Current packs/day: 0.75    Average packs/day: 0.8 packs/day for 49.0 years (36.8 ttl pk-yrs)    Types: Cigarettes    Passive exposure: Current   Smokeless tobacco: Never  Vaping Use   Vaping status: Never Used  Substance and Sexual Activity   Alcohol use: Not Currently   Drug use: Never   Sexual activity: Not Currently    Partners: Male  Other Topics Concern   Not on file  Social History Narrative   Son and his girlfriend Lives with her-2025 and a dog   Social Drivers of Health   Financial Resource Strain: High Risk (01/21/2024)   Overall Financial Resource Strain (CARDIA)    Difficulty of Paying Living Expenses: Hard  Food Insecurity: Food Insecurity Present (01/21/2024)    Hunger Vital Sign    Worried About Running Out of Food in the Last Year: Sometimes true    Ran Out of Food in the Last Year: Sometimes true  Transportation Needs: Unmet Transportation Needs (01/21/2024)   PRAPARE - Transportation    Lack of Transportation (Medical): Yes    Lack of Transportation (Non-Medical): Yes  Physical Activity: Unknown (01/21/2024)   Exercise Vital Sign    Days of Exercise per Week: 0 days    Minutes of Exercise per Session: Patient declined  Stress: Stress Concern Present (01/21/2024)   [employer] of Occupational Health - Occupational  Stress Questionnaire    Feeling of Stress : Rather much  Social Connections: Socially Isolated (01/21/2024)   Social Connection and Isolation Panel [NHANES]    Frequency of Communication with Friends and Family: More than three times a week    Frequency of Social Gatherings with Friends and Family: More than three times a week    Attends Religious Services: Never    Database administrator or Organizations: No    Attends Banker Meetings: Patient declined    Marital Status: Widowed    Tobacco Counseling Ready to quit: Not Answered Counseling given: Not Answered   Clinical Intake:  Pre-visit preparation completed: Yes  Pain : No/denies pain     BMI - recorded: 14.87 Nutritional Status: BMI <19  Underweight Nutritional Risks: Nausea/ vomitting/ diarrhea (nausea) Diabetes: No  How often do you need to have someone help you when you read instructions, pamphlets, or other written materials from your doctor or pharmacy?: 1 - Never     Information entered by :: Ellenie Salome, RMA   Activities of Daily Living    01/21/2024   12:50 PM 03/19/2023   11:04 AM  In your present state of health, do you have any difficulty performing the following activities:  Hearing? 0 1  Vision? 0 0  Difficulty concentrating or making decisions? 0 0  Walking or climbing stairs? 1 1  Dressing or bathing? 1 0  Doing  errands, shopping? 1 0  Preparing Food and eating ? N N  Using the Toilet? N N  In the past six months, have you accidently leaked urine? Y Y  Do you have problems with loss of bowel control? N Y  Managing your Medications? N N  Managing your Finances? N N  Housekeeping or managing your Housekeeping? Malvin Johns    Patient Care Team: Etta Grandchild, MD as PCP - General (Internal Medicine) Marzella Schlein., MD (Ophthalmology)  Indicate any recent Medical Services you may have received from other than [facility] providers in the past year (date may be approximate).     Assessment:   This is a routine wellness examination for Abigail Lamb.  Hearing/Vision screen Hearing Screening - Comments:: Denies hearing difficulties   Vision Screening - Comments:: Wears eyeglasses    Goals Addressed               This Visit's Progress     Quit Smoking (pt-stated)        I will try to ween myself off cigarettes. Now at 1/2 a pack now-2025      Depression Screen    01/21/2024    1:59 PM 12/03/2023    2:26 PM 05/13/2023    2:15 PM 03/19/2023   11:09 AM 02/09/2023   11:09 AM 10/27/2022   11:37 AM 03/11/2022    2:35 PM  PHQ 2/9 Scores  PHQ - 2 Score 3 6 2 2 2  0 2  PHQ- 9 Score 4 13 7 7 7  0 2    Fall Risk    01/21/2024   12:50 PM 12/03/2023    2:26 PM 05/13/2023    2:15 PM 03/19/2023   10:55 AM 03/18/2023   11:12 PM  Fall Risk   Falls in the past year? 0 0 0 0 0  Number falls in past yr: 0 0 0 0 0  Injury with Fall? 0  0 0   Risk for fall due to :  No Fall Risks No Fall Risks No Fall Risks  Follow up Falls evaluation completed;Falls prevention discussed Falls evaluation completed Falls evaluation completed Falls prevention discussed     MEDICARE RISK AT HOME: Medicare Risk at Home Any stairs in or around the home?: (Patient-Rptd) Yes If so, are there any without handrails?: (Patient-Rptd) No Home free of loose throw rugs in walkways, pet beds, electrical cords, etc?: (Patient-Rptd) Yes Adequate  lighting in your home to reduce risk of falls?: (Patient-Rptd) Yes Life alert?: (Patient-Rptd) No Use of a cane, walker or w/c?: (Patient-Rptd) No Grab bars in the bathroom?: (Patient-Rptd) No Shower chair or bench in shower?: (Patient-Rptd) No Elevated toilet seat or a handicapped toilet?: (Patient-Rptd) No  TIMED UP AND GO:  Was the test performed?  No    Cognitive Function:        01/21/2024    1:42 PM 03/19/2023   11:02 AM 11/05/2020   11:18 AM  6CIT Screen  What Year? 0 points 0 points 0 points  What month? 0 points 0 points 0 points  What time? 0 points 0 points 0 points  Count back from 20 0 points 0 points 0 points  Months in reverse 0 points 0 points 0 points  Repeat phrase 0 points 0 points 0 points  Total Score 0 points 0 points 0 points    Immunizations Immunization History  Administered Date(s) Administered   Fluad Quad(high Dose 65+) 11/08/2021, 10/27/2022   Fluad Trivalent(High Dose 65+) 11/17/2023   Influenza, High Dose Seasonal PF 12/30/2019   Influenza-Unspecified 01/04/2018, 12/15/2018, 09/11/2020   PNEUMOCOCCAL CONJUGATE-20 11/08/2021   Pneumococcal Polysaccharide-23 08/02/2020   Tdap 11/05/2020   Zoster Recombinant(Shingrix) 03/20/2021, 11/27/2021    TDAP status: Up to date  Flu Vaccine status: Up to date  Pneumococcal vaccine status: Up to date  Covid-19 vaccine status: Declined, Education has been provided regarding the importance of this vaccine but patient still declined. Advised may receive this vaccine at local pharmacy or Health Dept.or vaccine clinic. Aware to provide a copy of the vaccination record if obtained from local pharmacy or Health Dept. Verbalized acceptance and understanding.  Qualifies for Shingles Vaccine? Yes   Zostavax completed Yes   Shingrix Completed?: Yes  Screening Tests Health Maintenance  Topic Date Due   COVID-19 Vaccine (1) Never done   MAMMOGRAM  02/08/2024   Lung Cancer Screening  12/01/2024   Medicare  Annual Wellness (AWV)  01/20/2025   Colonoscopy  07/31/2025   DTaP/Tdap/Td (2 - Td or Tdap) 11/05/2030   Pneumonia Vaccine 78+ Years old  Completed   INFLUENZA VACCINE  Completed   DEXA SCAN  Completed   Hepatitis C Screening  Completed   Zoster Vaccines- Shingrix  Completed   HPV VACCINES  Aged Out    Health Maintenance  Health Maintenance Due  Topic Date Due   COVID-19 Vaccine (1) Never done    Colorectal cancer screening: Referral to GI placed 01/21/2024. Pt aware the office will call re: appt.  Mammogram status: Ordered 01/21/2024. Pt provided with contact info and advised to call to schedule appt.   Bone Density status: Completed 02/07/2022. Results reflect: Bone density results: OSTEOPOROSIS. Repeat every 2 years.  Lung Cancer Screening: (Low Dose CT Chest recommended if Age 103-80 years, 20 pack-year currently smoking OR have quit w/in 15years.) does qualify.   Lung Cancer Screening Referral: 01/14/2024  Additional Screening:  Hepatitis C Screening: does qualify; Completed 09/17/2020  Vision Screening: Recommended annual ophthalmology exams for early detection of glaucoma and other disorders of the eye. Is the patient up  to date with their annual eye exam?  Yes  Who is the provider or what is the name of the office in which the patient attends annual eye exams? [facility] ([facility]) If pt is not established with a provider, would they like to be referred to a provider to establish care? No .   Dental Screening: Recommended annual dental exams for proper oral hygiene    Community Resource Referral / Chronic Care Management: CRR required this visit?  Yes   CCM required this visit?  No     Plan:     I have personally reviewed and noted the following in the patient's chart:   Medical and social history Use of alcohol, tobacco or illicit drugs  Current medications and supplements including opioid prescriptions. Patient is not currently taking opioid  prescriptions. Functional ability and status Nutritional status Physical activity Advanced directives List of other physicians Hospitalizations, surgeries, and ER visits in previous 12 months Vitals Screenings to include cognitive, depression, and falls Referrals and appointments  In addition, I have reviewed and discussed with patient certain preventive protocols, quality metrics, and best practice recommendations. A written personalized care plan for preventive services as well as general preventive health recommendations were provided to patient.     Heru Montz L Tamisha Nordstrom, CMA   01/21/2024   After Visit Summary: (MyChart) Due to this being a telephonic visit, the after visit summary with patients personalized plan was offered to patient via MyChart   Nurse Notes: Patient is due for a Colonoscopy, a mammogram and a DEXA.  All orders have been placed today.  Patient is requesting a refill on Zofran 4 mg and also Duloxetine 60 mg and 30 mg.  She had no other concerns to address today.

## 2024-01-22 ENCOUNTER — Telehealth: Payer: Self-pay | Admitting: *Deleted

## 2024-01-22 ENCOUNTER — Telehealth: Payer: Self-pay | Admitting: Emergency Medicine

## 2024-01-22 DIAGNOSIS — J449 Chronic obstructive pulmonary disease, unspecified: Secondary | ICD-10-CM

## 2024-01-22 NOTE — Telephone Encounter (Signed)
PT's daughter (DPR)  calling because mom can not afford Anoro, even on the PT assist program. She wonders if Dr. Kristine Linea an alternative inhaler. Sending to PA team and Triage. Daughter is @ [phone]

## 2024-01-22 NOTE — Progress Notes (Signed)
Complex Care Management Note Care Guide Note  01/22/2024 Name: [name] MRN: [id number] DOB: 1951/09/18   Complex Care Management Outreach Attempts: An unsuccessful telephone outreach was attempted today to offer the patient information about available complex care management services.  Follow Up Plan:  Additional outreach attempts will be made to offer the patient complex care management information and services.   Encounter Outcome:  No Answer  [facility] HealthPopulation Health Care Guide  Direct Dial:[phone] Fax:[phone] Website: [email or web address]

## 2024-01-25 ENCOUNTER — Telehealth: Payer: Self-pay | Admitting: *Deleted

## 2024-01-25 ENCOUNTER — Other Ambulatory Visit (HOSPITAL_COMMUNITY): Payer: Self-pay

## 2024-01-25 NOTE — Progress Notes (Signed)
Complex Care Management Note Care Guide Note  01/25/2024 Name: Abigail Lamb MRN: [id number] DOB: 05-Mar-1951   Complex Care Management Outreach Attempts: A second unsuccessful outreach was attempted today to offer the patient with information about available complex care management services.  Follow Up Plan:  Additional outreach attempts will be made to offer the patient complex care management information and services.   Encounter Outcome:  No Answer  [facility] HealthPopulation Health Care Guide  Direct [phone] Fax:[phone] Website: [email or web address]

## 2024-01-25 NOTE — Telephone Encounter (Signed)
Per test claims Anoro is still being processed at patients pharmacy. Patient may have a deductible to meet before prices go down. With Anoro currently filled we cannot give accurate pricing for alternatives.

## 2024-01-26 NOTE — Telephone Encounter (Signed)
ATC pt. LMTCB

## 2024-01-27 ENCOUNTER — Telehealth: Payer: Self-pay | Admitting: *Deleted

## 2024-01-27 NOTE — Progress Notes (Signed)
Complex Care Management Note Care Guide Note  01/27/2024 Name: Abigail Lamb MRN: [id number] DOB: 1951/10/06   Complex Care Management Outreach Attempts: A third unsuccessful outreach was attempted today to offer the patient with information about available complex care management services.  Follow Up Plan:  Additional outreach attempts will be made to offer the patient complex care management information and services.   Encounter Outcome:  No Answer  [facility] HealthPopulation Health Care Guide  Direct [phone] Fax:[phone] Website: [email or web address]

## 2024-01-27 NOTE — Telephone Encounter (Signed)
ATCP. Unable to leave message. VM either full or unavailable. Will try again later.

## 2024-01-27 NOTE — Telephone Encounter (Signed)
Patient is returning missed call for Anoro alternative.

## 2024-01-28 ENCOUNTER — Telehealth: Payer: Self-pay | Admitting: *Deleted

## 2024-01-28 ENCOUNTER — Other Ambulatory Visit (HOSPITAL_COMMUNITY): Payer: Self-pay

## 2024-01-28 NOTE — Telephone Encounter (Signed)
Tried to call patient and daughter to update them on inhalers.

## 2024-01-28 NOTE — Telephone Encounter (Signed)
Daughter has cancelled the medication (anoro) at [facility]. Now test claim can be ran.

## 2024-01-28 NOTE — Telephone Encounter (Signed)
LMTCB

## 2024-01-28 NOTE — Progress Notes (Signed)
  Complex Care Management Note Care Guide Note  01/28/2024 Name: Abigail Lamb MRN: [id number] DOB: 27-Jun-1951   Complex Care Management Outreach Attempts: A third unsuccessful outreach was attempted today to offer the patient with information about available complex care management services.  Follow Up Plan:  No further outreach attempts will be made at this time. We have been unable to contact the patient to offer or enroll patient in complex care management services.  Encounter Outcome:  Patient Visit Completed  Dione Booze  [facility] HealthPopulation Health Care Guide  Direct [phone] Fax:[phone] Website: [email or web address]

## 2024-01-28 NOTE — Telephone Encounter (Signed)
C-

## 2024-01-28 NOTE — Telephone Encounter (Signed)
Daughter ret Abigail Lamb's call. Please try again. [phone] is Daughters #

## 2024-01-28 NOTE — Telephone Encounter (Signed)
Patient has a deductible she must pay before prices go down. All options for LAMA/LABA inhalers are $561.00 for a 90 day fill.

## 2024-01-28 NOTE — Telephone Encounter (Signed)
Spoke to patient's daughter, Rhiannon(DPR) and relayed below message/recommendations. She stated that she would contact the pharmacy and request that the Rx is canceled. She will call back once Rx has been canceled so that pharmacy team cab run a test claim.

## 2024-02-01 MED ORDER — ANORO ELLIPTA 62.5-25 MCG/ACT IN AEPB: 1.0000 | INHALATION_SPRAY | Freq: Every day | RESPIRATORY_TRACT | 1 refills | Status: DC

## 2024-02-01 NOTE — Telephone Encounter (Signed)
Patient is returning missed call [phone]

## 2024-02-01 NOTE — Telephone Encounter (Signed)
Spoke to patient and relayed below message/recommendations.  She would like [facility]. Rx has been sent.  Nothing further needed.

## 2024-02-01 NOTE — Telephone Encounter (Signed)
 I called the pt and there was no answer- LMTCB. ?

## 2024-02-03 ENCOUNTER — Telehealth: Payer: Self-pay | Admitting: Internal Medicine

## 2024-02-03 NOTE — Telephone Encounter (Signed)
 Good afternoon Dr. Federico,   Supervising Provider PM 2/5   We received a referral for patient to be scheduled for a colonoscopy. Patient last had colonoscopy in 04/2017 with [facility]. Since then, patient has moved to [place] and would like to establish care locally. Would you please review and advise on scheduling? Records have been scanned into Media.   Thank you.

## 2024-02-04 ENCOUNTER — Other Ambulatory Visit: Payer: Self-pay | Admitting: Internal Medicine

## 2024-02-04 DIAGNOSIS — F3341 Major depressive disorder, recurrent, in partial remission: Secondary | ICD-10-CM

## 2024-02-04 NOTE — Telephone Encounter (Signed)
 Copied from CRM [id number]. Topic: Clinical - Medication Refill >> Feb 04, 2024  3:58 PM Thersia BROCKS wrote: Most Recent Primary Care Visit:  Provider: TYUS, BRENDELL L  Department: [facility]  Visit Type: MEDICARE AWV, SEQUENTIAL  Date: 01/21/2024  Medication: DULoxetine  (CYMBALTA ) 30 MG capsule  Has the patient contacted their pharmacy? Yes (Agent: If no, request that the patient contact the pharmacy for the refill. If patient does not wish to contact the pharmacy document the reason why and proceed with request.) (Agent: If yes, when and what did the pharmacy advise?)  Is this the correct pharmacy for this prescription? Yes If no, delete pharmacy and type the correct one.  This is the patient's preferred pharmacy:   [id number] [place] [place] - [address] [place] [address] Phone: [phone] Fax: [phone]    Has the prescription been filled recently? No  Is the patient out of the medication? Yes  Has the patient been seen for an appointment in the last year OR does the patient have an upcoming appointment? Yes  Can we respond through MyChart? Yes  Agent: Please be advised that Rx refills may take up to 3 business days. We ask that you follow-up with your pharmacy.

## 2024-02-04 NOTE — Telephone Encounter (Signed)
 Last Fill: 04/20/23  Last OV: 01/21/24 Next OV: 02/17/24  Routing to provider for review/authorization.

## 2024-02-04 NOTE — Telephone Encounter (Signed)
 Okay to schedule colonoscopy in [facility] for history of colon polyps.  Labs 02/2017: C dif negative.O&P negative.  CXR 02/14/17: Suspected COPD/emphysema. Vague 1 cm nodular opacity in the RUL.  CT A/P w/contrast 02/14/17: Short segment enteritis in the left abdomen.  Ab U/S 11/18/17: Unremarkable.  EGD 05/04/17: Medium sized hiatal hernia. Biopsies of stomach taken. Localized erythema of the gastric cardia that was biopsied. Normal duodenum that was biopsied. Path: Chronic duodenitis. Normal stomach that was negative for H pylori. Reflux related changes from the gastric fundus that was negative for intestinal metaplasia.   Colonoscopy 05/04/17: Sigmoid colon diverticula. Localized erythema at IC valve that was biopsied. 4-6 mm polyp in the ascending colon removed with forceps. 2-3 mm sigmoid polyp removed. Path: Focal active enteritis at the IC valve that is favored to be mucosal trauma/prolapse. Random colon biopsies that were normal. One tubular adenoma in the ascending colon. Sigmoid colon polyp was benign colonic mucosa.

## 2024-02-08 ENCOUNTER — Other Ambulatory Visit: Payer: Self-pay | Admitting: Internal Medicine

## 2024-02-08 DIAGNOSIS — F3341 Major depressive disorder, recurrent, in partial remission: Secondary | ICD-10-CM

## 2024-02-09 ENCOUNTER — Encounter: Payer: Self-pay | Admitting: Internal Medicine

## 2024-02-09 ENCOUNTER — Telehealth: Payer: Self-pay | Admitting: Emergency Medicine

## 2024-02-09 MED ORDER — DULOXETINE HCL 30 MG PO CPEP: 90.0000 mg | ORAL_CAPSULE | Freq: Every day | ORAL | 0 refills | Status: DC

## 2024-02-09 NOTE — Telephone Encounter (Signed)
No samples of anoro. Is there something else she can try?

## 2024-02-09 NOTE — Telephone Encounter (Signed)
Stiolto would be a reasonable substitute if we have samples of this

## 2024-02-09 NOTE — Telephone Encounter (Signed)
The only inhalers we currently have in stock are breztri, spiriva 1.25 or trelegy (100 and 200)

## 2024-02-09 NOTE — Progress Notes (Signed)
 Office Visit Note  Patient: Abigail Lamb             Date of Birth: 1951-11-30           MRN: [id number]             PCP: Etta Grandchild, MD Referring: Etta Grandchild, MD Visit Date: 02/23/2024 Occupation: @GUAROCC @  Subjective:  Medication management   History of Present Illness: Abigail Lamb is a 73 y.o. female with rheumatoid arthritis, osteoarthritis and osteoporosis.  She returns today after her last visit in November 2024.  She has been on hydroxychloroquine 200 mg p.o. twice daily and sulfasalazine 500 mg 2 tablets twice daily for the treatment of rheumatoid arthritis.  At the last visit she was advised to start on IV Reclast and had her first infusion on December 03, 2023.    Activities of Daily Living:  Patient reports morning stiffness for 2-3 hours.   Patient Reports nocturnal pain.  Difficulty dressing/grooming: Reports Difficulty climbing stairs: Reports Difficulty getting out of chair: Reports Difficulty using hands for taps, buttons, cutlery, and/or writing: Reports  Review of Systems  Constitutional:  Positive for fatigue.  HENT:  Positive for mouth dryness. Negative for mouth sores.   Eyes:  Positive for dryness.  Respiratory:  Positive for shortness of breath.   Cardiovascular:  Positive for palpitations.  Gastrointestinal:  Negative for blood in stool, constipation and diarrhea.  Endocrine: Negative for increased urination.  Genitourinary:  Negative for involuntary urination.  Musculoskeletal:  Positive for joint pain, gait problem, joint pain, joint swelling, myalgias, muscle weakness, morning stiffness, muscle tenderness and myalgias.  Skin:  Positive for hair loss and sensitivity to sunlight. Negative for color change and rash.  Allergic/Immunologic: Positive for susceptible to infections.  Neurological:  Positive for dizziness and headaches.  Hematological:  Negative for swollen glands.  Psychiatric/Behavioral:  Positive for depressed mood.  Negative for sleep disturbance. The patient is nervous/anxious.     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) 11/08/2021   Gastroparesis 11/07/2021   Irritable bowel syndrome with both constipation and diarrhea 11/07/2021   Encounter for general adult medical examination with abnormal findings 11/07/2021   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   Visit for screening mammogram 08/02/2020   Rheumatoid arthritis involving multiple sites with positive rheumatoid factor (HCC) 08/02/2020   Hyperlipidemia LDL goal <130 08/02/2020   Tobacco abuse 08/02/2020   Dupuytren's contracture of right hand 08/02/2020   Pulmonary emphysema (HCC)    GOLD C COPD by GOLD classification (HCC) 01/30/2020   Neurodermatitis 03/28/2019   Primary insomnia 03/28/2019   Recurrent depression (HCC) 03/28/2019    Past Medical History:  Diagnosis Date   Celiac disease    GERD (gastroesophageal reflux disease)    MGUS (monoclonal gammopathy of unknown significance)    per patient    Osteoporosis    per patient, diagnosed by Dr. Darral Dash in [facility]   PUD (peptic ulcer disease)    Pulmonary emphysema (HCC)    Recurrent depression (HCC)  Family History  Problem Relation Age of Onset   CVA Mother    CAD Mother    Diabetes Mother    COPD Mother    Fibromyalgia Mother    Depression Mother    Hypertension Mother    Emphysema Mother        smoked   Heart attack Mother    Prostate cancer Father     Cancer - Prostate Father    Rheum arthritis Sister    Skin cancer Brother    Kidney Stones Brother    Hernia Brother    Depression Daughter    Healthy Son    Healthy Son    Past Surgical History:  Procedure Laterality Date   ABCESS DRAINAGE Left    under left arm   TONSILLECTOMY  1977   Social History   Social History Narrative   Son and his girlfriend Lives with her-2025 and a dog   Immunization History  Administered Date(s) Administered   Fluad Quad(high Dose 65+) 11/08/2021, 10/27/2022   Fluad Trivalent(High Dose 65+) 11/17/2023   Influenza, High Dose Seasonal PF 12/30/2019   Influenza-Unspecified 01/04/2018, 12/15/2018, 09/11/2020   PNEUMOCOCCAL CONJUGATE-20 11/08/2021   Pneumococcal Polysaccharide-23 08/02/2020   Tdap 11/05/2020   Zoster Recombinant(Shingrix) 03/20/2021, 11/27/2021     Objective: Vital Signs: BP 104/67 (BP Location: Left Arm, Patient Position: Sitting, Cuff Size: Normal)   Pulse 86   Ht 5' 1.5" (1.562 m)   Wt 79 lb 3.2 oz (35.9 kg)   BMI 14.72 kg/m    Physical Exam Vitals and nursing note reviewed.  Constitutional:      Appearance: She is well-developed.  HENT:     Head: Normocephalic and atraumatic.  Eyes:     Conjunctiva/sclera: Conjunctivae normal.  Cardiovascular:     Rate and Rhythm: Normal rate and regular rhythm.     Heart sounds: Normal heart sounds.  Pulmonary:     Effort: Pulmonary effort is normal.     Breath sounds: Normal breath sounds.  Abdominal:     General: Bowel sounds are normal.     Palpations: Abdomen is soft.  Musculoskeletal:     Cervical back: Normal range of motion.  Lymphadenopathy:     Cervical: No cervical adenopathy.  Skin:    General: Skin is warm and dry.     Capillary Refill: Capillary refill takes less than 2 seconds.  Neurological:     Mental Status: She is alert and oriented to person, place, and time.  Psychiatric:        Behavior: Behavior normal.      Musculoskeletal Exam: She had  limited lateral rotation of the cervical spine.  There was no tenderness over thoracic or lumbar spine.  Shoulder joints and elbow joints with good range of motion with nodulosis over bilateral elbows.  She had limited range of motion of bilateral wrist joints with tenosynovitis over bilateral wrist joints.  Synovial thickening was noted over bilateral MCP joints with subluxation and ulnar deviation of MCP joints.  She is unable to extend her MCPs.  Hip joints and knee joints with good range of motion.  There was no tenderness over ankles or MTPs.  CDAI Exam: CDAI Score: 10  Patient Global: 40 / 100; Provider Global: 40 / 100 Swollen: 1 ; Tender: 1  Joint Exam 02/23/2024      Right  Left  Wrist  Swollen Tender        Investigation: No additional findings.  Imaging: No results found.  Recent  Labs: Lab Results  Component Value Date   WBC 12.1 (H) 12/09/2023   HGB 14.9 12/09/2023   PLT 285 12/09/2023   NA 137 12/09/2023   K 4.2 12/09/2023   CL 104 12/09/2023   CO2 26 12/09/2023   GLUCOSE 86 12/09/2023   BUN 20 12/09/2023   CREATININE 0.65 12/09/2023   BILITOT 0.4 12/09/2023   ALKPHOS 124 12/09/2023   AST 23 12/09/2023   ALT 13 12/09/2023   PROT 8.2 (H) 12/09/2023   ALBUMIN 4.0 12/09/2023   CALCIUM 9.2 12/09/2023   GFRAA 107 10/26/2020   QFTBGOLDPLUS NEGATIVE 09/17/2020    Speciality Comments: PLQ Eye Exam: 01/26/2024 [facility] Follow up: 12/27/2024  Humira and Orencia were discontinued in the past due to frequent infections per patient.  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  Tenderness  over  several MCP joints with synovial thickening and ulnar deviation.  She has limited function of her hands.  She is currently on hydroxychloroquine 1 tablet twice a day and sulfasalazine 500 mg 2 tablets twice a day.  I advised her to reduce hydroxychloroquine to 1 tablet daily based on her weight.  Rheumatoid nodulosis (HCC) - Nodules noted on the extensor surface of both elbows.  High risk medication use - Plaquenil 200 mg 1 tablet PO BID and sulfasalazine 500 mg 2 tablets BID.  Patient was advised to reduce hydroxychloroquine to 1 tablet daily based on her weight.  No longer taking prednisone. PLQ Eye Exam: 01/26/2024.  CBC and CMP were normal on December 09, 2023.  I will check labs in a month and then every 5 months.  I will also check hydroxychloroquine level with her next labs.  Primary osteoarthritis of both knees-she denies any discomfort today.  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 She is followed by Dr. Leonides Schanz.Bone marrow biopsy on 10/24/2020 revealed normocellular marrow with polytypic plasmacytosis (5%).  Age-related osteoporosis without current pathological fracture -patient received IV Reclast on December 03, 2023.  She tolerated Reclast without any side effects.  DEXA 02/07/2022: LFN BMD 0.440 with T score -3.7.  Will obtain repeat DEXA scan next year as she has not taken treatment.  No recent falls.Forteo daily injections started 05/29/22. PT D/c forteo due to daily SE-nausea and dizziness.  Daily use of calcium and vitamin D was advised.  Vitamin D deficiency-vitamin D was normal at 79 on November 10, 2023.  Prediabetes  Hyperlipidemia LDL goal <130-I will check lipid panel with her next labs.  Stage 2 moderate COPD by GOLD classification (HCC)  Panlobular emphysema (HCC)  Lung nodules  Tobacco abuse  Kidney stone  Recurrent depression (HCC)  Primary insomnia  Orders: Orders Placed This Encounter  Procedures   Lipid panel   Hydroxychloroquine, Blood    No orders of the defined types were placed in this encounter.    Follow-Up Instructions: Return in about 5 months (around 07/22/2024) for Rheumatoid arthritis, Osteoarthritis, Osteoporosis.   Pollyann Savoy, MD  Note - This record has been created using Animal nutritionist.  Chart creation errors have been sought, but may not always  have been located. Such creation errors do not reflect on  the standard  of medical care.

## 2024-02-09 NOTE — Telephone Encounter (Signed)
Please see very last message on signed encounter.   PT's daughter (DPR)  calling saying she had not heard from [place]. The message chain is very long but it does mention daughters CB # at least twice. When we last called her the daughter states her mom "did not remember this" information (possibly mentally compromised). I read all the information to her. Explained that ANY comparable RX had a high deductible. She then  asked if [facility] had samples and if not could Dr. Delton Coombes provide comparable samples to her mom because she can not afford them.   Daughter's # is [phone]

## 2024-02-10 MED ORDER — BREZTRI AEROSPHERE 160-9-4.8 MCG/ACT IN AERO: 2.0000 | INHALATION_SPRAY | Freq: Two times a day (BID) | RESPIRATORY_TRACT | 0 refills | Status: DC

## 2024-02-10 NOTE — Telephone Encounter (Signed)
I spoke with pt's daughter. I informed her of Dr Kavin Leech note. Pt state she would love to pick up samples of Breztri and PAP for [place] as well. I informed daughter that I would leave the samples up front for her along with PAP. Pt daughter verbalized understanding.   Dr Delton Coombes, are you okay with rx the pt Markus Daft now? Daughter stated the financial aid wanted $600 out of pocket to assist them.

## 2024-02-10 NOTE — Telephone Encounter (Signed)
Abigail Lamb daughter is returning phone call. Abigail Lamb phone number is [phone].

## 2024-02-10 NOTE — Telephone Encounter (Signed)
If they are willing to pick it up, then ok to send script. I was under impression that they didn't think it was possible to purchase

## 2024-02-10 NOTE — Telephone Encounter (Signed)
Would recommend changing to Breztri 2 puffs twice a day since we have samples of this.  Need to work to see if we can get financial assistance for her through the company.  We are not going to be able to keep her adequately stocked just with samples

## 2024-02-10 NOTE — Telephone Encounter (Signed)
ATC X2. Lmtcb

## 2024-02-11 NOTE — Telephone Encounter (Signed)
Spoke to patient's daughter, Rhiannon (DPR). She stated that she picked up [facility] yesterday. Nothing further needed.

## 2024-02-16 ENCOUNTER — Other Ambulatory Visit: Payer: Self-pay | Admitting: Internal Medicine

## 2024-02-16 DIAGNOSIS — M0579 Rheumatoid arthritis with rheumatoid factor of multiple sites without organ or systems involvement: Secondary | ICD-10-CM

## 2024-02-17 ENCOUNTER — Ambulatory Visit: Payer: Medicare Other | Admitting: Internal Medicine

## 2024-02-22 ENCOUNTER — Other Ambulatory Visit: Payer: Self-pay | Admitting: Internal Medicine

## 2024-02-22 DIAGNOSIS — K582 Mixed irritable bowel syndrome: Secondary | ICD-10-CM

## 2024-02-23 ENCOUNTER — Other Ambulatory Visit: Payer: Self-pay | Admitting: *Deleted

## 2024-02-23 ENCOUNTER — Encounter: Payer: Self-pay | Admitting: Rheumatology

## 2024-02-23 ENCOUNTER — Ambulatory Visit: Payer: Medicare Other | Attending: Rheumatology | Admitting: Rheumatology

## 2024-02-23 VITALS — BP 104/67 | HR 86 | Ht 61.5 in | Wt 79.2 lb

## 2024-02-23 DIAGNOSIS — J431 Panlobular emphysema: Secondary | ICD-10-CM

## 2024-02-23 DIAGNOSIS — M0630 Rheumatoid nodule, unspecified site: Secondary | ICD-10-CM | POA: Diagnosis not present

## 2024-02-23 DIAGNOSIS — M170 Bilateral primary osteoarthritis of knee: Secondary | ICD-10-CM

## 2024-02-23 DIAGNOSIS — E559 Vitamin D deficiency, unspecified: Secondary | ICD-10-CM | POA: Diagnosis not present

## 2024-02-23 DIAGNOSIS — Z720 Tobacco use: Secondary | ICD-10-CM

## 2024-02-23 DIAGNOSIS — R7303 Prediabetes: Secondary | ICD-10-CM

## 2024-02-23 DIAGNOSIS — M810 Age-related osteoporosis without current pathological fracture: Secondary | ICD-10-CM | POA: Diagnosis not present

## 2024-02-23 DIAGNOSIS — M0579 Rheumatoid arthritis with rheumatoid factor of multiple sites without organ or systems involvement: Secondary | ICD-10-CM | POA: Diagnosis not present

## 2024-02-23 DIAGNOSIS — R918 Other nonspecific abnormal finding of lung field: Secondary | ICD-10-CM | POA: Diagnosis not present

## 2024-02-23 DIAGNOSIS — F5101 Primary insomnia: Secondary | ICD-10-CM

## 2024-02-23 DIAGNOSIS — J449 Chronic obstructive pulmonary disease, unspecified: Secondary | ICD-10-CM

## 2024-02-23 DIAGNOSIS — Z79899 Other long term (current) drug therapy: Secondary | ICD-10-CM

## 2024-02-23 DIAGNOSIS — E785 Hyperlipidemia, unspecified: Secondary | ICD-10-CM | POA: Diagnosis not present

## 2024-02-23 DIAGNOSIS — D472 Monoclonal gammopathy: Secondary | ICD-10-CM | POA: Diagnosis not present

## 2024-02-23 DIAGNOSIS — F339 Major depressive disorder, recurrent, unspecified: Secondary | ICD-10-CM

## 2024-02-23 DIAGNOSIS — N200 Calculus of kidney: Secondary | ICD-10-CM

## 2024-02-23 MED ORDER — HYDROXYCHLOROQUINE SULFATE 200 MG PO TABS: 200.0000 mg | ORAL_TABLET | Freq: Every day | ORAL | Status: DC

## 2024-02-23 NOTE — Patient Instructions (Signed)
 Standing Labs We placed an order today for your standing lab work.   Please have your standing labs drawn in March and every 3 months  Please have your labs drawn 2 weeks prior to your appointment so that the provider can discuss your lab results at your appointment, if possible.  Please note that you may see your imaging and lab results in MyChart before we have reviewed them. We will contact you once all results are reviewed. Please allow our office up to 72 hours to thoroughly review all of the results before contacting the office for clarification of your results.  WALK-IN LAB HOURS  Monday through Thursday from 8:00 am -12:30 pm and 1:00 pm-5:00 pm and Friday from 8:00 am-12:00 pm.  Patients with office visits requiring labs will be seen before walk-in labs.  You may encounter longer than normal wait times. Please allow additional time. Wait times may be shorter on  Monday and Thursday afternoons.  We do not book appointments for walk-in labs. We appreciate your patience and understanding with our staff.   Labs are drawn by [facility]. Please bring your co-pay at the time of your lab draw.  You may receive a bill from [facility] for your lab work.  Please note if you are on Hydroxychloroquine and and an order has been placed for a Hydroxychloroquine level,  you will need to have it drawn 4 hours or more after your last dose.  If you wish to have your labs drawn at another location, please call the office 24 hours in advance so we can fax the orders.  The office is located at [address], [place], [place] [address]   If you have any questions regarding directions or hours of operation,  please call [phone].   As a reminder, please drink plenty of water prior to coming for your lab work. Thanks!   Vaccines You are taking a medication(s) that can suppress your immune system.  The following immunizations are recommended: Flu annually Covid-19  RSV Td/Tdap (tetanus,  diphtheria, pertussis) every 10 years Pneumonia (Prevnar 15 then Pneumovax 23 at least 1 year apart.  Alternatively, can take Prevnar 20 without needing additional dose) Shingrix: 2 doses from 4 weeks to 6 months apart  Please check with your PCP to make sure you are up to date.   If you have signs or symptoms of an infection or start antibiotics: First, call your PCP for workup of your infection. Hold your medication through the infection, until you complete your antibiotics, and until symptoms resolve if you take the following: Injectable medication (Actemra, Benlysta, Cimzia, Cosentyx, Enbrel, Humira, Kevzara, Orencia, Remicade, Simponi, Stelara, Taltz, Tremfya) Methotrexate Leflunomide (Arava) Mycophenolate (Cellcept) Harriette Ohara, Olumiant, or Rinvoq

## 2024-02-23 NOTE — Addendum Note (Signed)
 Addended by: Metta Clines on: 02/23/2024 03:40 PM   Modules accepted: Orders

## 2024-02-23 NOTE — Telephone Encounter (Signed)
 Patient advised Per Dr. Corliss Skains, patient should decrease her PLQ to 1 tablet po daily due to her weight. With her next labs we will do a PLQ level and a lipid panel. Make sure to tell patient not to take PLQ in the AM for her labs. Verify that patient is taking SSZ 2 tabs po BID. Patient states she is taking the SSZ as prescribed. Patient verbalized understanding. Please review and sign no print.

## 2024-02-23 NOTE — Telephone Encounter (Signed)
 Per Dr. Corliss Skains, patient should decrease her PLQ to 1 tablet po daily due to her weight. With her next labs we will do a PLQ level and a lipid panel. Make sure to tell patient not to take PLQ in the AM for her labs. Verify that patient is taking SSZ 2 tabs po BID  Attempted to contact the patient and left message for patient to call the office.

## 2024-02-25 ENCOUNTER — Other Ambulatory Visit: Payer: Self-pay | Admitting: Physician Assistant

## 2024-02-26 NOTE — Telephone Encounter (Signed)
 Last Fill: 12/04/2023  Labs: 12/09/2023 Total Protein 8.2, WBC 12.1, Neutro Abs 7.9  Next Visit: 07/21/2024  Last Visit: 02/23/2024  DX: Rheumatoid arthritis with rheumatoid factor of multiple sites without organ or systems involvement   Current Dose per office note 02/23/2024: sulfasalazine 500 mg 2 tablets BID   Okay to refill Sulfasalazine?

## 2024-02-29 ENCOUNTER — Ambulatory Visit: Payer: Medicare Other | Admitting: Podiatry

## 2024-02-29 ENCOUNTER — Ambulatory Visit (INDEPENDENT_AMBULATORY_CARE_PROVIDER_SITE_OTHER)

## 2024-02-29 ENCOUNTER — Encounter: Payer: Self-pay | Admitting: Podiatry

## 2024-02-29 DIAGNOSIS — M2042 Other hammer toe(s) (acquired), left foot: Secondary | ICD-10-CM

## 2024-02-29 DIAGNOSIS — M778 Other enthesopathies, not elsewhere classified: Secondary | ICD-10-CM | POA: Diagnosis not present

## 2024-02-29 DIAGNOSIS — M2041 Other hammer toe(s) (acquired), right foot: Secondary | ICD-10-CM | POA: Diagnosis not present

## 2024-02-29 DIAGNOSIS — L97522 Non-pressure chronic ulcer of other part of left foot with fat layer exposed: Secondary | ICD-10-CM | POA: Diagnosis not present

## 2024-02-29 DIAGNOSIS — B351 Tinea unguium: Secondary | ICD-10-CM

## 2024-02-29 DIAGNOSIS — M79675 Pain in left toe(s): Secondary | ICD-10-CM

## 2024-02-29 DIAGNOSIS — M79674 Pain in right toe(s): Secondary | ICD-10-CM | POA: Diagnosis not present

## 2024-02-29 MED ORDER — CICLOPIROX 8 % EX SOLN: Freq: Every day | CUTANEOUS | 2 refills | Status: DC

## 2024-02-29 MED ORDER — MUPIROCIN 2 % EX OINT: 1.0000 | TOPICAL_OINTMENT | Freq: Two times a day (BID) | CUTANEOUS | 2 refills | Status: AC

## 2024-02-29 MED ORDER — AMOXICILLIN-POT CLAVULANATE 875-125 MG PO TABS: 1.0000 | ORAL_TABLET | Freq: Two times a day (BID) | ORAL | 0 refills | Status: DC

## 2024-02-29 NOTE — Patient Instructions (Signed)
 Clean the foot with saline.  Apply a small amount of the mupirocin, antibiotic ointment, to the wound twice a day.  Remain in surgical shoe to decrease pressure.  I have ordered a circulation test to make sure you have enough blood flow to heal the wounds.  This to be done at [facility] and they will contact you to schedule.  Monitor for any signs/symptoms of infection. Call the office immediately if any occur or go directly to the emergency room. Call with any questions/concerns.

## 2024-02-29 NOTE — Progress Notes (Unsigned)
 Subjective:   Patient ID: Abigail Lamb, female   DOB: 73 y.o.   MRN: [id number]   HPI Chief Complaint  Patient presents with   Nail Problem    RM#12 Bilateral nail fungus also has areas on the foot not sure what it is.   73 year old female presents the office today for concerns of nail discoloration and thickening and she cannot able to trim them herself.  She said her toes are curling over.  She has a history of fracture of the left foot but no recent injury.  She also recently noticed a wound on the left great toe which is causing pain.  She not sure how this started.  Denies any drainage or pus or increasing swelling or redness.  Area is tender.  No fevers or chills.   Review of Systems  All other systems reviewed and are negative.  Past Medical History:  Diagnosis Date   Celiac disease    GERD (gastroesophageal reflux disease)    MGUS (monoclonal gammopathy of unknown significance)    per patient    Osteoporosis    per patient, diagnosed by Dr. Darral Dash in [facility]   PUD (peptic ulcer disease)    Pulmonary emphysema (HCC)    Recurrent depression (HCC)     Past Surgical History:  Procedure Laterality Date   ABCESS DRAINAGE Left    under left arm   TONSILLECTOMY  1977     Current Outpatient Medications:    albuterol (VENTOLIN HFA) 108 (90 Base) MCG/ACT inhaler, Take 1-2 puffs every 4-6 hrs prn shortness of breath. Needs ventolin., Disp: , Rfl:    amoxicillin-clavulanate (AUGMENTIN) 875-125 MG tablet, Take 1 tablet by mouth 2 (two) times daily., Disp: 28 tablet, Rfl: 0   Budeson-Glycopyrrol-Formoterol (BREZTRI AEROSPHERE) 160-9-4.8 MCG/ACT AERO, Inhale 2 puffs into the lungs in the morning and at bedtime., Disp: 5.9 g, Rfl: 0   celecoxib (CELEBREX) 50 MG capsule, TAKE 1 CAPSULE BY MOUTH 2 TIMES DAILY., Disp: 60 capsule, Rfl: 1   Cholecalciferol (VITAMIN D3) 125 MCG (5000 UT) CAPS, Take 5,000 Units by mouth daily., Disp: , Rfl:    ciclopirox (PENLAC) 8 % solution, Apply  topically at bedtime. Apply over nail and surrounding skin. Apply daily over previous coat. After seven (7) days, may remove with alcohol and continue cycle., Disp: 6.6 mL, Rfl: 2   clindamycin (CLEOCIN T) 1 % external solution, as needed. , Disp: , Rfl:    clobetasol (OLUX) 0.05 % topical foam, Apply topically as needed., Disp: , Rfl:    clonazePAM (KLONOPIN) 0.5 MG tablet, Take 1 tablet (0.5 mg total) by mouth 2 (two) times daily as needed for anxiety., Disp: 60 tablet, Rfl: 2   colesevelam (WELCHOL) 625 MG tablet, TAKE 3 TABLETS BY MOUTH TWICE A DAY WITH MEAL, Disp: 180 tablet, Rfl: 3   dicyclomine (BENTYL) 10 MG capsule, TAKE 1 CAPSULE (10 MG TOTAL) BY MOUTH 4 TIMES A DAY BEFORE MEALS AND AT BEDTIME, Disp: 360 capsule, Rfl: 0   DULoxetine (CYMBALTA) 30 MG capsule, Take 3 capsules (90 mg total) by mouth daily. For total of 90 mg daily, Disp: 270 capsule, Rfl: 0   fluocinonide-emollient (LIDEX-E) 0.05 % cream, Apply 1 application topically 2 (two) times daily. (Patient taking differently: Apply 1 application  topically as needed.), Disp: 60 g, Rfl: 3   fluticasone (FLONASE) 50 MCG/ACT nasal spray, Place 1 spray into both nostrils daily. (Patient taking differently: Place 1 spray into both nostrils as needed.), Disp: 16 g, Rfl:  11   folic acid (FOLVITE) 1 MG tablet, Take by mouth., Disp: , Rfl:    hydroxychloroquine (PLAQUENIL) 200 MG tablet, TAKE 1 TABLET BY MOUTH TWICE A DAY, Disp: 180 tablet, Rfl: 0   hydroxychloroquine (PLAQUENIL) 200 MG tablet, Take 1 tablet (200 mg total) by mouth daily., Disp: , Rfl:    mupirocin ointment (BACTROBAN) 2 %, Apply 1 Application topically 2 (two) times daily., Disp: 30 g, Rfl: 2   sulfaSALAzine (AZULFIDINE) 500 MG tablet, TAKE 2 TABLETS BY MOUTH TWICE A DAY, Disp: 120 tablet, Rfl: 2   Zoledronic Acid (RECLAST IV), Inject into the vein. Infusion: 12/03/2023, Disp: , Rfl:    famotidine (PEPCID) 40 MG tablet, TAKE 1 TABLET BY MOUTH TWICE A DAY (Patient not taking:  Reported on 02/29/2024), Disp: 180 tablet, Rfl: 1  Allergies  Allergen Reactions   Dust Mite Extract     Multiple environmental allergies.           Objective:  Physical Exam  General: AAO x3, NAD  Dermatological: On the left foot ulceration at the left hallux IPJ, left fifth MTPJ with a preulcerative lesion noted on the left third toe.  On the wound the fifth MPJ does probe down to capsule.  There is probing the hallux wound but is not down to bone today.  There is surrounding localized erythema.  No fluctuance or crepitation.  There is no drainage or pus.  No malodor.  Does appear to be hypertrophic, dystrophic with yellow discoloration and subungual debris present.  Tenderness nails 1-5 bilaterally.      Vascular: Dorsalis Pedis artery and Posterior Tibial artery pedal pulses are 1/4 bilateral with immedate capillary fill time. There is no pain with calf compression, swelling, warmth, erythema.   Neruologic: Sensation decreased with Semmes Weinstein monofilament.  Musculoskeletal: Digital contractures present.      Assessment:   Ulceration left foot, rheumatoid arthritis, symptomatic onychomycosis     Plan:  -Treatment options discussed including all alternatives, risks, and complications -Etiology of symptoms were discussed -X-rays were obtained and reviewed with the patient.  3 views of the feet were obtained.  His chronic changes present with erosions of the fifth MPJ.  On the left medial knee oblique view there is a "c-shaped" the lucency along the IPJ which could be chronic but cannot completely rule out infection.  Arthrodesis present.  Fusion of the first MTPJ noted. -For the wounds we discussed possibility of deeper infection.  I would start antibiotics for prescribed Augmentin.  Recommend daily dressing changes with mupirocin ointment which was also prescribed.  Surgical shoe dispensed for offloading.  I do believe a repeat x-ray next appointment.  Further concern  will be dependent imaging.  Have also ordered ABI to check circulation. Prescribed mobic. Discussed side effects of the medication and directed to stop if any are to occur and call the office.  -Sharply debrided nails x 10 without any complications or bleeding. Penlac for fungus  Return in about 2 weeks (around 03/14/2024) for toe ulcer, x-ray left foot.  Vivi Barrack DPM

## 2024-03-09 ENCOUNTER — Ambulatory Visit (HOSPITAL_COMMUNITY)
Admission: RE | Admit: 2024-03-09 | Discharge: 2024-03-09 | Disposition: A | Discharge status: Home / Self Care | Source: Ambulatory Visit | Attending: Podiatry | Admitting: Podiatry

## 2024-03-09 DIAGNOSIS — L97522 Non-pressure chronic ulcer of other part of left foot with fat layer exposed: Secondary | ICD-10-CM | POA: Insufficient documentation

## 2024-03-09 LAB — VAS US ABI WITH/WO TBI
Left ABI: 1.16
Right ABI: 1.18

## 2024-03-10 ENCOUNTER — Ambulatory Visit (INDEPENDENT_AMBULATORY_CARE_PROVIDER_SITE_OTHER)

## 2024-03-10 ENCOUNTER — Ambulatory Visit: Admitting: Podiatry

## 2024-03-10 DIAGNOSIS — I739 Peripheral vascular disease, unspecified: Secondary | ICD-10-CM | POA: Diagnosis not present

## 2024-03-10 DIAGNOSIS — L97522 Non-pressure chronic ulcer of other part of left foot with fat layer exposed: Secondary | ICD-10-CM | POA: Diagnosis not present

## 2024-03-13 NOTE — Progress Notes (Signed)
 Subjective:   Patient ID: Abigail Lamb, female   DOB: 73 y.o.   MRN: [id number]   HPI Chief Complaint  Patient presents with   Foot Ulcer    RM#13 Follow up on left foot ulcers patient no change.    73 year old female presents the office today for follow-up evaluation of wound to her left foot.  She states they are doing about the same.  She does not report any drainage or pus or increasing swelling or redness.  She has not had any fevers or chills.  She did have her ABIs completed.   Review of Systems  All other systems reviewed and are negative.  Past Medical History:  Diagnosis Date   Celiac disease    GERD (gastroesophageal reflux disease)    MGUS (monoclonal gammopathy of unknown significance)    per patient    Osteoporosis    per patient, diagnosed by Dr. Darral Dash in [facility]   PUD (peptic ulcer disease)    Pulmonary emphysema (HCC)    Recurrent depression (HCC)     Past Surgical History:  Procedure Laterality Date   ABCESS DRAINAGE Left    under left arm   TONSILLECTOMY  1977   Allergies  Allergen Reactions   Dust Mite Extract     Multiple environmental allergies.           Objective:  Physical Exam  General: AAO x3, NAD  Dermatological: Overall exam appears to be unchanged.  On the left foot ulceration at the left hallux IPJ, left fifth MTPJ with a preulcerative lesion noted on the left third toe.  On the wound the fifth MPJ does probe down to capsule.  There is probing the hallux wound but is not down to bone today.  There is localized surrounding erythema.  No fluctuance or crepitation.  There is no drainage or pus.  No malodor.     Vascular: Dorsalis Pedis artery and Posterior Tibial artery pedal pulses are 1/4 bilateral with immedate capillary fill time. There is no pain with calf compression, swelling, warmth, erythema.   Neruologic: Sensation decreased with Semmes Weinstein monofilament.  Musculoskeletal: Digital contractures present.       Assessment:   Ulceration left foot, rheumatoid arthritis     Plan:  -Treatment options discussed including all alternatives, risks, and complications -Etiology of symptoms were discussed -ABIs reviewed.  Referral to vascular surgery -Given ongoing nature of ulcerations I recommended MRI which is ordered today for osteomyelitis.  Difficult to evaluate on x-ray given the rheumatoid arthritis and the erosions noted. -Discussed that she is at risk of toe loss -Continue daily dressing changes with antibiotic ointment. -Monitor for any clinical signs or symptoms of infection and directed to call the office immediately should any occur or go to the ER.  Return in about 3 weeks (around 03/31/2024) for foot ulcers left.  Vivi Barrack DPM

## 2024-03-14 ENCOUNTER — Telehealth: Payer: Self-pay | Admitting: Emergency Medicine

## 2024-03-14 ENCOUNTER — Other Ambulatory Visit (HOSPITAL_COMMUNITY): Payer: Self-pay

## 2024-03-14 ENCOUNTER — Ambulatory Visit: Payer: Medicare Other

## 2024-03-14 ENCOUNTER — Other Ambulatory Visit: Payer: Self-pay

## 2024-03-14 MED ORDER — BREZTRI AEROSPHERE 160-9-4.8 MCG/ACT IN AERO: INHALATION_SPRAY | RESPIRATORY_TRACT | Status: DC

## 2024-03-14 MED ORDER — BREZTRI AEROSPHERE 160-9-4.8 MCG/ACT IN AERO: 2.0000 | INHALATION_SPRAY | Freq: Two times a day (BID) | RESPIRATORY_TRACT | Status: DC

## 2024-03-14 NOTE — Telephone Encounter (Signed)
 Patient is completely out of Breztri. She is out of it completely.   Pharmacy: [facility] on [address]

## 2024-03-14 NOTE — Telephone Encounter (Signed)
 Thank you. She may have a script deductible, or we may be able to find a less expensive option. Will ask our pharmacist to run a test case for most cost effective LABA/LAMA/ICS

## 2024-03-14 NOTE — Telephone Encounter (Signed)
 Spoke w/ patient. I have left 2 samples of breztri up front. Pt states breztri is too expensive and that she cannot breathe without it. Pt wanted me to still call it into pharmacy as she figures out which pharmacy will cover it the cheapest. [facility], preferred pharmacy & samples have been placed up front. Pt verbalized understanding. NFN. Routing to Dr. Verdene Lennert.

## 2024-03-15 ENCOUNTER — Telehealth: Payer: Self-pay

## 2024-03-15 ENCOUNTER — Other Ambulatory Visit (HOSPITAL_COMMUNITY): Payer: Self-pay

## 2024-03-15 NOTE — Telephone Encounter (Signed)
*  sent to office in original message  Per test claims patient does still have a $300+ deductible to meet which is contributing to the high cost. Doing 2 different inhalers to complete triple therapy would be more expensive at this time due to the deductible.   Breztri-$349.27 Trelegy- $349.27  Wixela-$91.37 and Incruse- $349.27

## 2024-03-15 NOTE — Telephone Encounter (Addendum)
 Please advise Dr. Delton Coombes

## 2024-03-15 NOTE — Telephone Encounter (Signed)
 We can try to help defray cost w samples as able. The [employer] is going to be high until she meets this year's deductible.

## 2024-03-15 NOTE — Telephone Encounter (Signed)
 Per test claims patient does still have a $300+ deductible to meet which is contributing to the high cost. Doing 2 different inhalers to complete triple therapy would be more expensive at this time due to the deductible.

## 2024-03-16 ENCOUNTER — Encounter: Payer: Self-pay | Admitting: Vascular Surgery

## 2024-03-16 ENCOUNTER — Ambulatory Visit: Admitting: Vascular Surgery

## 2024-03-16 VITALS — BP 111/68 | HR 100 | Temp 97.7°F | Resp 20 | Ht 61.5 in | Wt 76.3 lb

## 2024-03-16 DIAGNOSIS — L97521 Non-pressure chronic ulcer of other part of left foot limited to breakdown of skin: Secondary | ICD-10-CM

## 2024-03-16 NOTE — Progress Notes (Signed)
 Patient ID: Abigail Lamb, female   DOB: [birth year], 73 y.o.   MRN: [id number]  Reason for Consult: New Patient (Initial Visit)   Referred by Etta Grandchild, MD  Subjective:     HPI:  Abigail Lamb is a 73 y.o. female followed by podiatry for ulcerations of her left foot.  She had a small injury to the left great toe and this is failed to heal.  She recently completed antibiotics and is also using an antibiotic ointment.  She denies any fevers or chills.  She has not gone ABI evaluation now here to discuss options for.  She is slow to walk due to underlying rheumatoid arthritis but denies claudication.  Past Medical History:  Diagnosis Date   Celiac disease    GERD (gastroesophageal reflux disease)    MGUS (monoclonal gammopathy of unknown significance)    per patient    Osteoporosis    per patient, diagnosed by Dr. Darral Dash in [facility]   PUD (peptic ulcer disease)    Pulmonary emphysema (HCC)    Recurrent depression (HCC)    Family History  Problem Relation Age of Onset   CVA Mother    CAD Mother    Diabetes Mother    COPD Mother    Fibromyalgia Mother    Depression Mother    Hypertension Mother    Emphysema Mother        smoked   Heart attack Mother    Prostate cancer Father    Cancer - Prostate Father    Rheum arthritis Sister    Skin cancer Brother    Kidney Stones Brother    Hernia Brother    Depression Daughter    Healthy Son    Healthy Son    Past Surgical History:  Procedure Laterality Date   ABCESS DRAINAGE Left    under left arm   TONSILLECTOMY  1977    Short Social History:  Social History   Tobacco Use   Smoking status: Every Day    Current packs/day: 0.75    Average packs/day: 0.8 packs/day for 49.0 years (36.8 ttl pk-yrs)    Types: Cigarettes    Passive exposure: Current   Smokeless tobacco: Never  Substance Use Topics   Alcohol use: Not Currently    Allergies  Allergen Reactions   Dust Mite Extract     Multiple environmental  allergies.     Current Outpatient Medications  Medication Sig Dispense Refill   albuterol (VENTOLIN HFA) 108 (90 Base) MCG/ACT inhaler Take 1-2 puffs every 4-6 hrs prn shortness of breath. Needs ventolin.     amoxicillin-clavulanate (AUGMENTIN) 875-125 MG tablet Take 1 tablet by mouth 2 (two) times daily. 28 tablet 0   budeson-glycopyrrolate-formoterol (BREZTRI AEROSPHERE) 160-9-4.8 MCG/ACT AERO Inhale 2 puffs into the lungs in the morning and at bedtime.     budeson-glycopyrrolate-formoterol (BREZTRI AEROSPHERE) 160-9-4.8 MCG/ACT AERO 2 Samples 2 each    celecoxib (CELEBREX) 50 MG capsule TAKE 1 CAPSULE BY MOUTH 2 TIMES DAILY. 60 capsule 1   Cholecalciferol (VITAMIN D3) 125 MCG (5000 UT) CAPS Take 5,000 Units by mouth daily.     ciclopirox (PENLAC) 8 % solution Apply topically at bedtime. Apply over nail and surrounding skin. Apply daily over previous coat. After seven (7) days, may remove with alcohol and continue cycle. 6.6 mL 2   clindamycin (CLEOCIN T) 1 % external solution as needed.      clobetasol (OLUX) 0.05 % topical foam Apply topically as needed.  clonazePAM (KLONOPIN) 0.5 MG tablet Take 1 tablet (0.5 mg total) by mouth 2 (two) times daily as needed for anxiety. 60 tablet 2   colesevelam (WELCHOL) 625 MG tablet TAKE 3 TABLETS BY MOUTH TWICE A DAY WITH MEAL 180 tablet 3   dicyclomine (BENTYL) 10 MG capsule TAKE 1 CAPSULE (10 MG TOTAL) BY MOUTH 4 TIMES A DAY BEFORE MEALS AND AT BEDTIME 360 capsule 0   DULoxetine (CYMBALTA) 30 MG capsule Take 3 capsules (90 mg total) by mouth daily. For total of 90 mg daily 270 capsule 0   famotidine (PEPCID) 40 MG tablet TAKE 1 TABLET BY MOUTH TWICE A DAY 180 tablet 1   fluocinonide-emollient (LIDEX-E) 0.05 % cream Apply 1 application topically 2 (two) times daily. (Patient taking differently: Apply 1 application  topically as needed.) 60 g 3   fluticasone (FLONASE) 50 MCG/ACT nasal spray Place 1 spray into both nostrils daily. (Patient taking  differently: Place 1 spray into both nostrils as needed.) 16 g 11   folic acid (FOLVITE) 1 MG tablet Take by mouth.     hydroxychloroquine (PLAQUENIL) 200 MG tablet TAKE 1 TABLET BY MOUTH TWICE A DAY 180 tablet 0   hydroxychloroquine (PLAQUENIL) 200 MG tablet Take 1 tablet (200 mg total) by mouth daily.     mupirocin ointment (BACTROBAN) 2 % Apply 1 Application topically 2 (two) times daily. 30 g 2   sulfaSALAzine (AZULFIDINE) 500 MG tablet TAKE 2 TABLETS BY MOUTH TWICE A DAY 120 tablet 2   Zoledronic Acid (RECLAST IV) Inject into the vein. Infusion: 12/03/2023     No current facility-administered medications for this visit.    Review of Systems  Constitutional: Positive for unexpected weight change.  HENT: HENT negative.  Cardiovascular: Cardiovascular negative.  GI: Gastrointestinal negative.  Skin:       Left foot ulceration Neurological: Neurological negative. Hematologic: Hematologic/lymphatic negative.  Psychiatric: Psychiatric negative.        Objective:  Objective   Vitals:   03/16/24 1457  BP: 111/68  Pulse: 100  Resp: 20  Temp: 97.7 F (36.5 C)  SpO2: 94%  Weight: 76 lb 4.8 oz (34.6 kg)  Height: 5' 1.5" (1.562 m)   Body mass index is 14.18 kg/m.  Physical Exam Cardiovascular:     Rate and Rhythm: Normal rate.     Pulses:          Posterior tibial pulses are 2+ on the right side and 2+ on the left side.  Abdominal:     General: Abdomen is flat.  Skin:    Capillary Refill: Capillary refill takes less than 2 seconds.     Comments: Bandages on left foot ulceration  Neurological:     General: No focal deficit present.     Mental Status: She is alert.  Psychiatric:        Thought Content: Thought content normal.        Judgment: Judgment normal.     Data: ABI Findings:  +---------+------------------+-----+-----------+--------+  Right   Rt Pressure (mmHg)IndexWaveform   Comment   +---------+------------------+-----+-----------+--------+   Brachial 111                                         +---------+------------------+-----+-----------+--------+  PTA     147               1.18 multiphasic          +---------+------------------+-----+-----------+--------+  DP      139               1.11 biphasic             +---------+------------------+-----+-----------+--------+  Great Toe                       Absent               +---------+------------------+-----+-----------+--------+   +---------+------------------+-----+-----------+-------+  Left    Lt Pressure (mmHg)IndexWaveform   Comment  +---------+------------------+-----+-----------+-------+  Brachial 125                                        +---------+------------------+-----+-----------+-------+  PTA     145               1.16 multiphasic         +---------+------------------+-----+-----------+-------+  DP      119               0.95 biphasic            +---------+------------------+-----+-----------+-------+  Great Toe                       Absent              +---------+------------------+-----+-----------+-------+   +-------+-----------+-----------+------------+------------+  ABI/TBIToday's ABIToday's TBIPrevious ABIPrevious TBI  +-------+-----------+-----------+------------+------------+  Right 1.18       0.0                                  +-------+-----------+-----------+------------+------------+  Left  1.16       0.0                                  +-------+-----------+-----------+------------+------------+         Summary:  Right: Resting right ankle-brachial index is within normal range. The  right toe-brachial index is abnormal.   Left: Resting left ankle-brachial index is within normal range. The left  toe-brachial index is abnormal.       Assessment/Plan:     73 year old female with ulceration of her toes on the left foot with absent toe pressures.  Thankfully she  has palpable posterior tibial pulses and hopefully can heal with wound care and protection of her feet alone.  She can see me on as-needed basis.     Maeola Harman MD [facility]

## 2024-03-16 NOTE — Telephone Encounter (Signed)
 Spoke w/ Aurea Graff. Pt is aware that she needs to meet her deductible. Per RB he is ok w/ giving pt breztri samples. Pt stated whenever she picked her first batch of samples up it contained assistance paperwork for inhaler & pt is going to trying to drop it off at our office when the form gets completed. Pt verbalized understanding. Nothing further needed.

## 2024-03-21 ENCOUNTER — Ambulatory Visit: Admitting: Podiatry

## 2024-03-22 ENCOUNTER — Ambulatory Visit: Payer: Self-pay | Admitting: Internal Medicine

## 2024-03-22 ENCOUNTER — Ambulatory Visit: Payer: Medicare Other | Admitting: Internal Medicine

## 2024-03-22 ENCOUNTER — Emergency Department (HOSPITAL_COMMUNITY)

## 2024-03-22 ENCOUNTER — Ambulatory Visit: Payer: Self-pay

## 2024-03-22 ENCOUNTER — Emergency Department (HOSPITAL_COMMUNITY)
Admission: EM | Admit: 2024-03-22 | Discharge: 2024-03-23 | Disposition: A | Discharge status: Home / Self Care | Attending: Emergency Medicine | Admitting: Emergency Medicine

## 2024-03-22 ENCOUNTER — Other Ambulatory Visit: Payer: Self-pay

## 2024-03-22 ENCOUNTER — Encounter (HOSPITAL_COMMUNITY): Payer: Self-pay

## 2024-03-22 DIAGNOSIS — Z79899 Other long term (current) drug therapy: Secondary | ICD-10-CM | POA: Insufficient documentation

## 2024-03-22 DIAGNOSIS — R1032 Left lower quadrant pain: Secondary | ICD-10-CM | POA: Diagnosis not present

## 2024-03-22 DIAGNOSIS — J449 Chronic obstructive pulmonary disease, unspecified: Secondary | ICD-10-CM | POA: Insufficient documentation

## 2024-03-22 DIAGNOSIS — D72829 Elevated white blood cell count, unspecified: Secondary | ICD-10-CM | POA: Diagnosis not present

## 2024-03-22 DIAGNOSIS — R109 Unspecified abdominal pain: Secondary | ICD-10-CM | POA: Diagnosis present

## 2024-03-22 DIAGNOSIS — R9431 Abnormal electrocardiogram [ECG] [EKG]: Secondary | ICD-10-CM | POA: Diagnosis not present

## 2024-03-22 DIAGNOSIS — N200 Calculus of kidney: Secondary | ICD-10-CM | POA: Diagnosis not present

## 2024-03-22 DIAGNOSIS — R079 Chest pain, unspecified: Secondary | ICD-10-CM | POA: Diagnosis not present

## 2024-03-22 DIAGNOSIS — K529 Noninfective gastroenteritis and colitis, unspecified: Secondary | ICD-10-CM | POA: Diagnosis not present

## 2024-03-22 DIAGNOSIS — J439 Emphysema, unspecified: Secondary | ICD-10-CM | POA: Diagnosis not present

## 2024-03-22 DIAGNOSIS — R918 Other nonspecific abnormal finding of lung field: Secondary | ICD-10-CM | POA: Diagnosis not present

## 2024-03-22 LAB — COMPREHENSIVE METABOLIC PANEL
ALT: 22 U/L (ref 0–44)
AST: 31 U/L (ref 15–41)
Albumin: 3.6 g/dL (ref 3.5–5.0)
Alkaline Phosphatase: 80 U/L (ref 38–126)
Anion gap: 18 — ABNORMAL HIGH (ref 5–15)
BUN: 27 mg/dL — ABNORMAL HIGH (ref 8–23)
CO2: 16 mmol/L — ABNORMAL LOW (ref 22–32)
Calcium: 8.9 mg/dL (ref 8.9–10.3)
Chloride: 102 mmol/L (ref 98–111)
Creatinine, Ser: 0.8 mg/dL (ref 0.44–1.00)
GFR, Estimated: >60 mL/min (ref >60)
Glucose, Bld: 89 mg/dL (ref 70–99)
Potassium: 4.4 mmol/L (ref 3.5–5.1)
Sodium: 136 mmol/L (ref 135–145)
Total Bilirubin: 1.1 mg/dL (ref 0.0–1.2)
Total Protein: 8.2 g/dL — ABNORMAL HIGH (ref 6.5–8.1)

## 2024-03-22 LAB — CBC WITH DIFFERENTIAL/PLATELET
Abs Immature Granulocytes: 0.05 10*3/uL (ref 0.00–0.07)
Basophils Absolute: 0.1 10*3/uL (ref 0.0–0.1)
Basophils Relative: 1 %
Eosinophils Absolute: 0.1 10*3/uL (ref 0.0–0.5)
Eosinophils Relative: 1 %
HCT: 47.9 % — ABNORMAL HIGH (ref 36.0–46.0)
Hemoglobin: 15.8 g/dL — ABNORMAL HIGH (ref 12.0–15.0)
Immature Granulocytes: 0 %
Lymphocytes Relative: 31 %
Lymphs Abs: 4 10*3/uL (ref 0.7–4.0)
MCH: 30.8 pg (ref 26.0–34.0)
MCHC: 33 g/dL (ref 30.0–36.0)
MCV: 93.4 fL (ref 80.0–100.0)
Monocytes Absolute: 1.2 10*3/uL — ABNORMAL HIGH (ref 0.1–1.0)
Monocytes Relative: 10 %
Neutro Abs: 7.3 10*3/uL (ref 1.7–7.7)
Neutrophils Relative %: 57 %
Platelets: 248 10*3/uL (ref 150–400)
RBC: 5.13 MIL/uL — ABNORMAL HIGH (ref 3.87–5.11)
RDW: 13.9 % (ref 11.5–15.5)
WBC: 12.8 10*3/uL — ABNORMAL HIGH (ref 4.0–10.5)
nRBC: 0 % (ref 0.0–0.2)

## 2024-03-22 LAB — TROPONIN I (HIGH SENSITIVITY): Troponin I (High Sensitivity): 11 ng/L (ref <18)

## 2024-03-22 LAB — LIPASE, BLOOD: Lipase: 34 U/L (ref 11–51)

## 2024-03-22 MED ORDER — METOCLOPRAMIDE HCL 5 MG/ML IJ SOLN
5.0000 mg | Freq: Once | INTRAMUSCULAR | Status: AC
  Administered 2024-03-22: 5 mg via INTRAVENOUS
  Filled 2024-03-22: qty 2

## 2024-03-22 MED ORDER — IOHEXOL 350 MG/ML SOLN
75.0000 mL | Freq: Once | INTRAVENOUS | Status: AC | PRN
  Administered 2024-03-22: 75 mL via INTRAVENOUS

## 2024-03-22 MED ORDER — MORPHINE SULFATE (PF) 2 MG/ML IV SOLN
2.0000 mg | Freq: Once | INTRAVENOUS | Status: AC
  Administered 2024-03-22: 2 mg via INTRAVENOUS
  Filled 2024-03-22: qty 1

## 2024-03-22 MED ORDER — ONDANSETRON HCL 4 MG/2ML IJ SOLN
4.0000 mg | Freq: Once | INTRAMUSCULAR | Status: AC
Start: 2024-03-22 — End: 2024-03-22
  Administered 2024-03-22: 4 mg via INTRAVENOUS
  Filled 2024-03-22: qty 2

## 2024-03-22 MED ORDER — SODIUM CHLORIDE 0.9 % IV BOLUS
1000.0000 mL | Freq: Once | INTRAVENOUS | Status: AC
  Administered 2024-03-22: 1000 mL via INTRAVENOUS

## 2024-03-22 MED ORDER — ONDANSETRON 4 MG PO TBDP: 4.0000 mg | ORAL_TABLET | Freq: Three times a day (TID) | ORAL | 0 refills | Status: DC | PRN

## 2024-03-22 MED ORDER — LOPERAMIDE HCL 2 MG PO CAPS: 2.0000 mg | ORAL_CAPSULE | Freq: Four times a day (QID) | ORAL | 0 refills | Status: DC | PRN

## 2024-03-22 NOTE — ED Notes (Signed)
 Pt ambulated to the bathroom with minimal assistance. PT was given a gingerale and a pack of graham crackers.

## 2024-03-22 NOTE — Discharge Instructions (Signed)
 Your CT scan shows no acute findings. I would recommend zofran as needed for nausea and vomiting. Drink lots of water and alternate Pedialyte drink. You can try bland foods like toast, crackers, banana, or apple sauce.  You have diarrhea you can take Imodium or Pepto-Bismol.  You can take 4 mg p.o. of Imodium if you have repeat diarrhea you can take 2 mg.  I would recommend following up with primary care doctor in 2 to 3 days to ensure improvement of symptoms.  Return to emergency room if you no longer tolerating oral intake or if pain returns.

## 2024-03-22 NOTE — ED Provider Notes (Incomplete)
 Patient care taken over at shift change. Repeat BMP after second liter of fluids and PO challenge.   Physical Exam  BP 117/66   Pulse 92   Temp 97.6 F (36.4 C) (Oral)   Resp 13   Ht 5\' 1"  (1.549 m)   Wt 34.5 kg   SpO2 97%   BMI 14.36 kg/m   Physical Exam  Procedures  Procedures  ED Course / MDM   Clinical Course as of 03/22/24 2331  Tue Mar 22, 2024  2242 Reassessed, reporting feeling better, but continues to have mild nausea. Will order Reglan. Vitals stables dispo pending scan.  [JB]    Clinical Course User Index [JB] Barrett, Horald Chestnut, PA-C   Medical Decision Making Amount and/or Complexity of Data Reviewed Labs: ordered. Radiology: ordered.  Risk Prescription drug management.   ***

## 2024-03-22 NOTE — Telephone Encounter (Signed)
 Chief Complaint: abdominal pain Symptoms: abd pain, nausea, diarrhea, dry heaving, chills Frequency: 3 days Pertinent Negatives: Patient denies SOB, CP, back pain, urinary symptoms Disposition: [x] ED /[] Urgent Care (no appt availability in office) / [] Appointment(In office/virtual)/ []  [facility]/ [] Home Care/ [] Refused Recommended Disposition /[] [facility]/ []  Follow-up with PCP Additional Notes: Daughter Rhiannon calls on behalf of the pt. Daughter states pt has had abdominal pain, nausea, diarrhea, chills, and dry heaving for 3 days. Pt is sipping on Gatorade but has only had two crackers in the last two days. Pt is still urinating normally. Diarrhea for 3 days, about 7 episodes pure day and it is very watery. Abdominal pain is a 6/10 around the waistband area. Pt very weak. RN advised daughter pt should go to the ED. Daughter agreeable and states she will take her. RN advised daughter to call 911 if the patient develops worsening. Daughter verbalized understanding.    Copied from CRM [id number]. Topic: Clinical - Red Word Triage >> Mar 22, 2024  4:14 PM Almira Coaster wrote: Red Word that prompted transfer to Nurse Triage: Patient's daughter is calling with symptoms of severe pain in the abdomen, diarrhea, tempeture change from hot to cold and dry heaves and lack of eating for the last couple of days. Reason for Disposition  [1] SEVERE pain (e.g., excruciating) AND [2] present > 1 hour  Answer Assessment - Initial Assessment Questions 1. LOCATION: "Where does it hurt?"      Abdominal pain - "in the middle where the waistband hits" 2. RADIATION: "Does the pain shoot anywhere else?" (e.g., chest, back)     No 3. ONSET: "When did the pain begin?" (e.g., minutes, hours or days ago)      3 days  4. SUDDEN: "Gradual or sudden onset?"     Suddenly  5. PATTERN "Does the pain come and go, or is it constant?"    - If it comes and goes: "How long does it last?" "Do you have pain  now?"     (Note: Comes and goes means the pain is intermittent. It goes away completely between bouts.)    - If constant: "Is it getting better, staying the same, or getting worse?"      (Note: Constant means the pain never goes away completely; most serious pain is constant and gets worse.)      Comes and goes 6. SEVERITY: "How bad is the pain?"  (e.g., Scale 1-10; mild, moderate, or severe)    - MILD (1-3): Doesn't interfere with normal activities, abdomen soft and not tender to touch.     - MODERATE (4-7): Interferes with normal activities or awakens from sleep, abdomen tender to touch.     - SEVERE (8-10): Excruciating pain, doubled over, unable to do any normal activities.       6/10 per daughter  73. RECURRENT SYMPTOM: "Have you ever had this type of stomach pain before?" If Yes, ask: "When was the last time?" and "What happened that time?"      Yes - diverticulosis 8. CAUSE: "What do you think is causing the stomach pain?"     Poss diverticulosis  9. RELIEVING/AGGRAVATING FACTORS: "What makes it better or worse?" (e.g., antacids, bending or twisting motion, bowel movement)     Daughter is not sure  10. OTHER SYMPTOMS: "Do you have any other symptoms?" (e.g., back pain, diarrhea, fever, urination pain, vomiting)       Sipping on Gatorade, has had two crackers in the  last two days. Hx of diverticulosis. Daughter states pt is due for a colonoscopy but it is not until April. Approx 7 episodes of diarrhea in the last 24 hours, very watery. Diarrhea for 3 days. Endorses dry heaving, nothing has come up. Daughter states pt has chills, has not taken temperature. No CP or SOB. Only weighs 82 lbs. "She eats well." "No one else has gotten sick", daughter states pt has RA and is susceptible. Daughter states pt is still urinating. Daughter states pt is "very weak", worse than usual. No back pain.  Protocols used: Abdominal Pain - Female-A-AH

## 2024-03-22 NOTE — ED Provider Notes (Signed)
 [place] EMERGENCY DEPARTMENT AT [facility] Provider Note   CSN: [id number] Arrival date & time: 03/22/24  1811     History  Chief Complaint  Patient presents with   Abdominal Pain    Abigail Lamb is a 73 y.o. female patient with past medical history of COPD Gold, depression, rheumatoid arthritis, lichen simplex chronicus, peptic ulcer disease, GERD presenting to emergency room with complaint of 7 days of abdominal pain nausea vomiting and diarrhea.  Patient locates pain to left upper and left lower quadrant.  Pain is intermittent.  She has not been tolerating oral intake the past few days that she does not know if food is aggravating her pain.  Reports it is worse with movement and with walking.  Patient reports that she has had 3-4 episodes of nonbilious nonbloody vomit almost daily.  Has not had any vomiting today.  Reports that she has had diarrhea 1-3 episodes daily has not noticed any abnormal color or blood in stool.  Reports 1 episode of diarrhea today and she is passing gas.  She reports that she was recently on amoxicillin for left great toe ulcer.  She is no longer on the antibiotics.  She denies any recent travel.  No recent surgeries. No fever.    Abdominal Pain      Home Medications Prior to Admission medications   Medication Sig Start Date End Date Taking? Authorizing Provider  albuterol (VENTOLIN HFA) 108 (90 Base) MCG/ACT inhaler Take 1-2 puffs every 4-6 hrs prn shortness of breath. Needs ventolin. 09/07/18   [provider]  amoxicillin-clavulanate (AUGMENTIN) 875-125 MG tablet Take 1 tablet by mouth 2 (two) times daily. 02/29/24   Vivi Barrack, DPM  budeson-glycopyrrolate-formoterol (BREZTRI AEROSPHERE) 160-9-4.8 MCG/ACT AERO Inhale 2 puffs into the lungs in the morning and at bedtime. 03/14/24   Leslye Peer, MD  budeson-glycopyrrolate-formoterol (BREZTRI AEROSPHERE) 160-9-4.8 MCG/ACT AERO 2 Samples 03/14/24   Leslye Peer, MD   celecoxib (CELEBREX) 50 MG capsule TAKE 1 CAPSULE BY MOUTH 2 TIMES DAILY. 02/18/24   Etta Grandchild, MD  Cholecalciferol (VITAMIN D3) 125 MCG (5000 UT) CAPS Take 5,000 Units by mouth daily.    [provider]  ciclopirox (PENLAC) 8 % solution Apply topically at bedtime. Apply over nail and surrounding skin. Apply daily over previous coat. After seven (7) days, may remove with alcohol and continue cycle. 02/29/24   Vivi Barrack, DPM  clindamycin (CLEOCIN T) 1 % external solution as needed.  01/20/20   [provider]  clobetasol (OLUX) 0.05 % topical foam Apply topically as needed. 01/20/20   [provider]  clonazePAM (KLONOPIN) 0.5 MG tablet Take 1 tablet (0.5 mg total) by mouth 2 (two) times daily as needed for anxiety. 12/14/23   Etta Grandchild, MD  colesevelam [facility]) 625 MG tablet TAKE 3 TABLETS BY MOUTH TWICE A DAY WITH MEAL 12/24/23   Etta Grandchild, MD  dicyclomine (BENTYL) 10 MG capsule TAKE 1 CAPSULE (10 MG TOTAL) BY MOUTH 4 TIMES A DAY BEFORE MEALS AND AT BEDTIME 02/23/24   Etta Grandchild, MD  DULoxetine (CYMBALTA) 30 MG capsule Take 3 capsules (90 mg total) by mouth daily. For total of 90 mg daily 02/09/24   Etta Grandchild, MD  famotidine (PEPCID) 40 MG tablet TAKE 1 TABLET BY MOUTH TWICE A DAY 01/16/23   Etta Grandchild, MD  fluocinonide-emollient (LIDEX-E) 0.05 % cream Apply 1 application topically 2 (two) times daily. Patient taking differently: Apply  1 application  topically as needed. 11/05/20   Etta Grandchild, MD  fluticasone (FLONASE) 50 MCG/ACT nasal spray Place 1 spray into both nostrils daily. Patient taking differently: Place 1 spray into both nostrils as needed. 08/19/22   Omar Person, MD  folic acid (FOLVITE) 1 MG tablet Take by mouth. 02/28/19   [provider]  hydroxychloroquine (PLAQUENIL) 200 MG tablet TAKE 1 TABLET BY MOUTH TWICE A DAY 10/23/23   Gearldine Bienenstock, PA-C  hydroxychloroquine (PLAQUENIL) 200 MG tablet Take 1  tablet (200 mg total) by mouth daily. 02/23/24   Pollyann Savoy, MD  mupirocin ointment (BACTROBAN) 2 % Apply 1 Application topically 2 (two) times daily. 02/29/24   Vivi Barrack, DPM  sulfaSALAzine (AZULFIDINE) 500 MG tablet TAKE 2 TABLETS BY MOUTH TWICE A DAY 02/26/24   Pollyann Savoy, MD  Zoledronic Acid (RECLAST IV) Inject into the vein. Infusion: 12/03/2023    [provider]      Allergies    Dust mite extract    Review of Systems   Review of Systems  Gastrointestinal:  Positive for abdominal pain.    Physical Exam Updated Vital Signs BP (!) 117/54 (BP Location: Right Arm)   Pulse (!) 118   Temp 97.8 F (36.6 C)   Resp 14   Ht 5\' 1"  (1.549 m)   Wt 34.5 kg   SpO2 95%   BMI 14.36 kg/m  Physical Exam Vitals and nursing note reviewed.  Constitutional:      General: She is not in acute distress.    Appearance: She is not toxic-appearing.  HENT:     Head: Normocephalic and atraumatic.  Eyes:     General: No scleral icterus.    Conjunctiva/sclera: Conjunctivae normal.  Cardiovascular:     Rate and Rhythm: Normal rate and regular rhythm.     Pulses: Normal pulses.     Heart sounds: Normal heart sounds.  Pulmonary:     Effort: Pulmonary effort is normal. No respiratory distress.     Breath sounds: Normal breath sounds.  Abdominal:     General: Abdomen is flat. Bowel sounds are normal. There is no distension.     Palpations: Abdomen is soft. There is no mass.     Tenderness: There is abdominal tenderness.     Comments: Left abdominal tenderness to palpation.  No obvious overlying rash.  No distention, mass or ascites.  Skin:    General: Skin is warm and dry.     Findings: No lesion.  Neurological:     General: No focal deficit present.     Mental Status: She is alert and oriented to person, place, and time. Mental status is at baseline.     ED Results / Procedures / Treatments   Labs (all labs ordered are listed, but only abnormal results are  displayed) Labs Reviewed  COMPREHENSIVE METABOLIC PANEL - Abnormal; Notable for the following components:      Result Value   CO2 16 (*)    BUN 27 (*)    Total Protein 8.2 (*)    Anion gap 18 (*)    All other components within normal limits  CBC WITH DIFFERENTIAL/PLATELET - Abnormal; Notable for the following components:   WBC 12.8 (*)    RBC 5.13 (*)    Hemoglobin 15.8 (*)    HCT 47.9 (*)    Monocytes Absolute 1.2 (*)    All other components within normal limits  LIPASE, BLOOD  URINALYSIS, ROUTINE W REFLEX  MICROSCOPIC    EKG None  Radiology CT ABDOMEN PELVIS W CONTRAST Result Date: 03/22/2024 CLINICAL DATA:  Left lower quadrant pain EXAM: CT ABDOMEN AND PELVIS WITH CONTRAST TECHNIQUE: Multidetector CT imaging of the abdomen and pelvis was performed using the standard protocol following bolus administration of intravenous contrast. RADIATION DOSE REDUCTION: This exam was performed according to the departmental dose-optimization program which includes automated exposure control, adjustment of the mA and/or kV according to patient size and/or use of iterative reconstruction technique. CONTRAST:  75mL OMNIPAQUE IOHEXOL 350 MG/ML SOLN COMPARISON:  None Available. FINDINGS: Lower chest: Lung bases demonstrate emphysematous changes. No focal infiltrate is seen. Hepatobiliary: No focal liver abnormality is seen. No gallstones, gallbladder wall thickening, or biliary dilatation. Pancreas: Unremarkable. No pancreatic ductal dilatation or surrounding inflammatory changes. Spleen: Normal in size without focal abnormality. Adrenals/Urinary Tract: Right adrenal gland is within normal limits. Left adrenal gland again demonstrates a prominent lesion measuring 2.0 x 1.3 cm in greatest dimension. This is stable dating back to 2022 and felt to be benign in etiology. No further follow-up is recommended. Kidneys demonstrate a normal enhancement pattern bilaterally. Punctate nonobstructing left renal calculi are  noted. No ureteral stone is seen. The bladder is partially distended. Stomach/Bowel: No obstructive or inflammatory changes of the colon are seen. The appendix is not well seen although no inflammatory changes to suggest appendicitis are noted. Small bowel and stomach are within normal limits. Vascular/Lymphatic: Aortic atherosclerosis. No enlarged abdominal or pelvic lymph nodes. Reproductive: Uterus demonstrates some small partially calcified fibroids. No adnexal mass is noted. Other: No abdominal wall hernia or abnormality. No abdominopelvic ascites. Musculoskeletal: No acute or significant osseous findings. IMPRESSION: 1. Stable left adrenal lesion. 2. Punctate nonobstructing left renal calculi. 3. No other focal abnormality is noted. 4. Aortic atherosclerosis. Electronically Signed   By: Alcide Clever M.D.   On: 03/22/2024 22:42   DG Chest 2 View Result Date: 03/22/2024 CLINICAL DATA:  Chest pain for 3 days. EXAM: CHEST - 2 VIEW COMPARISON:  November 17, 2023. FINDINGS: The heart size and mediastinal contours are within normal limits. Both lungs are clear. Hyperexpansion of the lungs is noted. The visualized skeletal structures are unremarkable. IMPRESSION: No active cardiopulmonary disease. Hyperexpansion of the lungs suggesting COPD. Emphysema (ICD10-J43.9). Electronically Signed   By: Lupita Raider M.D.   On: 03/22/2024 20:42    Procedures Procedures    Medications Ordered in ED Medications - No data to display  ED Course/ Medical Decision Making/ A&P Clinical Course as of 03/22/24 2318  Tue Mar 22, 2024  2242 Reassessed, reporting feeling better, but continues to have mild nausea. Will order Reglan. Vitals stables dispo pending scan.  [JB]    Clinical Course User Index [JB] Ardath Lepak, Horald Chestnut, PA-C                                 Medical Decision Making Amount and/or Complexity of Data Reviewed Labs: ordered. Radiology: ordered.  Risk Prescription drug management.   Kenzly Rogoff 73 y.o. presented today for abd pain. Working DDx includes, but not limited to, gastroenteritis, colitis, SBO, appendicitis, cholecystitis, hepatobiliary pathology, gastritis, PUD, ACS, dissection, pancreatitis, nephrolithiasis, AAA, UTI, pyelonephritis.  R/o DDx: These are considered less likely than current impression due to history of present illness, physical exam, labs/imaging findings.  Review of prior external notes: 3, 19, 2025 OV  Pmhx:  COPD Gold, depression, rheumatoid arthritis, lichen simplex chronicus,  peptic ulcer disease, GERD  Unique Tests and My Interpretation:  CBC with mild leukocytosis of 12.8, hemoglobin is 15.8, CMP without significant electrolyte abnormality.  Anion gap of 10.  Troponin is 11.  Lipase 34.  I have ordered C. difficile panel.  Urinalysis pending.  Imaging:  CT Abd/Pelvis with contrast: evaluate for structural/surgical etiology of patients' severe abdominal pain.  Chest x-ray negative.   Problem List / ED Course / Critical interventions / Medication management  Presenting with NVD and abdominal pain. Not tolerating oral intake. Has recent antibiotic use.  Exam patient primarily has left upper quadrant abdominal pain.  No obvious rash, deformity or ascites.  Lungs clear to auscultation bilaterally.  Denies chest pain or shortness of breath.  Does not have any fever or chills.  Symptoms are consistent with gastroenteritis.  Given focal abdominal pain will obtain imaging of abdomen to rule out any acute process.  Denies any urinary symptoms.  She is stable and well-appearing.  Was initially tachycardic in triage but tachycardia improved after brought to room. CBC without leukocytosis and no anemia.  CMP without elevation in liver enzymes.  She has normal kidney function with no AKI.  Does have anion gap which feels likely secondary to starvation ketosis. Lipase normal.  Also awaiting CT scan given focal abdominal tenderness. Patient reports she is  feeling better PO challenging now. Will given send letter. Will recheck BMP. If BMP improved and tolerating oral intake, anticipate stable for discharge home.   I ordered medication including normal saline, Zofran, morphine Reevaluation of the patient after these medicines showed that the patient improved Patients vitals assessed. Upon arrival patient is hemodynamically stable.  I have reviewed the patients home medicines and have made adjustments as needed   Plan: Signed off to [facility] dispo pending PO challenge and recheck BMP.          Final Clinical Impression(s) / ED Diagnoses Final diagnoses:  Gastroenteritis    Rx / DC Orders ED Discharge Orders     None         Raford Pitcher Evalee Jefferson 03/22/24 2331    Gloris Manchester, MD 03/23/24 [phone]

## 2024-03-22 NOTE — ED Triage Notes (Addendum)
 Pt began having abd pain, NVD, for the last week and has been getting worse over the last 3 days. Pt has not been able to keep anything down for 3 days. No emesis today but has has diarrhea today. Pt feeling weak. Denies urinary symptoms. Pain is a dull ache that comes and goes in RLQ and LLQ

## 2024-03-22 NOTE — ED Provider Notes (Signed)
 Patient care taken over at shift change. Repeat BMP after second liter of fluids and PO challenge.   On reevaluation, patient reports pain in the epigastrium area, has a history of ulcers in the past and wonders if it could be related, but overall is feeling better than when she arrived.  She is able to tolerate ginger ale and graham crackers. Physical Exam  BP 117/66   Pulse 92   Temp 97.6 F (36.4 C) (Oral)   Resp 13   Ht 5\' 1"  (1.549 m)   Wt 34.5 kg   SpO2 97%   BMI 14.36 kg/m   Physical Exam  Procedures  Procedures  ED Course / MDM   Clinical Course as of 03/22/24 2331  Tue Mar 22, 2024  2242 Reassessed, reporting feeling better, but continues to have mild nausea. Will order Reglan. Vitals stables dispo pending scan.  [JB]    Clinical Course User Index [JB] Barrett, Horald Chestnut, PA-C   Medical Decision Making Amount and/or Complexity of Data Reviewed Labs: ordered. Radiology: ordered.  Risk Prescription drug management.   Discussed findings of labs and imaging with patient.  I ordered pantoprazole to see if that will help with patient's abdominal pain, she is otherwise feeling better after the fluids and nausea medication.  She is able to eat graham crackers and sip on ginger ale without any nausea or vomiting.  UA resulted and showed rare bacteria and small leukocytes.  Urine sent for culture.  Will not treat for UTI since patient denies any urinary symptoms at this time.  Basic metabolic panel shows improved anion gap.  Patient states abdominal pain improved with pantoprazole.  Prescription sent to the pharmacy as well as prescriptions for Imodium and Zofran.  Patient is stable and safe for discharge home.  Advised close primary care follow-up.  Strict return precautions given.    Maxwell Marion, PA-C 03/23/24 0324    Dione Booze, MD 03/23/24 [phone]

## 2024-03-22 NOTE — Telephone Encounter (Signed)
 Spoke with pt daughter, Rhiannon. Pt was advised to go to ED by another triage nurse today. Pt is worried about going inside the ED due to the risk of catching something else. Pt daughter wants to see if pt can be admitted directly to hospital instead of waiting in ED. Per protocol, no direct admits at this time and pt daughter made aware. Pt daughter states understanding.    Copied from CRM [id number]. Topic: Clinical - Red Word Triage >> Mar 22, 2024  4:14 PM Almira Coaster wrote: Red Word that prompted transfer to Nurse Triage: Patient's daughter is calling with symptoms of severe pain in the abdomen, diarrhea, tempeture change from hot to cold and dry heaves and lack of eating for the last couple of days. Reason for Disposition . Health Information question, no triage required and triager able to answer question  Answer Assessment - Initial Assessment Questions 1. REASON FOR CALL or QUESTION: "What is your reason for calling today?" or "How can I best help you?" or "What question do you have that I can help answer?"     Request for direct admit to hospital for pt  Protocols used: Information Only Call - No Triage-A-AH

## 2024-03-23 LAB — URINALYSIS, ROUTINE W REFLEX MICROSCOPIC
Bilirubin Urine: NEGATIVE
Glucose, UA: NEGATIVE mg/dL
Hgb urine dipstick: NEGATIVE
Ketones, ur: 5 mg/dL — AB
Nitrite: NEGATIVE
Protein, ur: NEGATIVE mg/dL
Specific Gravity, Urine: >1.046 — ABNORMAL HIGH (ref 1.005–1.030)
pH: 5 (ref 5.0–8.0)

## 2024-03-23 LAB — BASIC METABOLIC PANEL
Anion gap: 9 (ref 5–15)
BUN: 20 mg/dL (ref 8–23)
CO2: 22 mmol/L (ref 22–32)
Calcium: 7.5 mg/dL — ABNORMAL LOW (ref 8.9–10.3)
Chloride: 106 mmol/L (ref 98–111)
Creatinine, Ser: 0.76 mg/dL (ref 0.44–1.00)
GFR, Estimated: >60 mL/min (ref >60)
Glucose, Bld: 118 mg/dL — ABNORMAL HIGH (ref 70–99)
Potassium: 3.6 mmol/L (ref 3.5–5.1)
Sodium: 137 mmol/L (ref 135–145)

## 2024-03-23 MED ORDER — PANTOPRAZOLE SODIUM 40 MG PO TBEC: 40.0000 mg | DELAYED_RELEASE_TABLET | Freq: Every day | ORAL | 0 refills | Status: DC

## 2024-03-23 MED ORDER — PANTOPRAZOLE SODIUM 40 MG IV SOLR
40.0000 mg | Freq: Once | INTRAVENOUS | Status: AC
  Administered 2024-03-23: 40 mg via INTRAVENOUS
  Filled 2024-03-23: qty 10

## 2024-03-23 NOTE — ED Notes (Signed)
 PT experienced no n/v/d after eating crackers and drinking a gingerale.

## 2024-03-24 ENCOUNTER — Ambulatory Visit: Admitting: Podiatry

## 2024-03-24 ENCOUNTER — Telehealth: Payer: Self-pay

## 2024-03-24 NOTE — Transitions of Care (Post Inpatient/ED Visit) (Signed)
   03/24/2024  Name: Abigail Lamb MRN: [id number] DOB: 01/11/1951  Today's TOC FU Call Status: Today's TOC FU Call Status:: Unsuccessful Call (1st Attempt) Unsuccessful Call (1st Attempt) Date: 03/24/24  Attempted to reach the patient regarding the most recent Inpatient/ED visit.  Follow Up Plan: Additional outreach attempts will be made to reach the patient to complete the Transitions of Care (Post Inpatient/ED visit) call.   Signature Karena Addison, LPN [facility] Nurse Health Advisor Direct Dial [phone]

## 2024-03-25 LAB — URINE CULTURE: Culture: 40000 — AB

## 2024-03-25 NOTE — Transitions of Care (Post Inpatient/ED Visit) (Signed)
   03/25/2024  Name: Abigail Lamb MRN: [id number] DOB: [birth year]  Today's TOC FU Call Status: Today's TOC FU Call Status:: Unsuccessful Call (2nd Attempt) Unsuccessful Call (1st Attempt) Date: 03/24/24 Unsuccessful Call (2nd Attempt) Date: 03/25/24  Attempted to reach the patient regarding the most recent Inpatient/ED visit.  Follow Up Plan: Additional outreach attempts will be made to reach the patient to complete the Transitions of Care (Post Inpatient/ED visit) call.   Signature Karena Addison, LPN [facility] Nurse Health Advisor Direct Dial [phone]

## 2024-03-26 ENCOUNTER — Telehealth (HOSPITAL_BASED_OUTPATIENT_CLINIC_OR_DEPARTMENT_OTHER): Payer: Self-pay | Admitting: *Deleted

## 2024-03-26 NOTE — Telephone Encounter (Signed)
 Post ED Visit - Positive Culture Follow-up  Culture report reviewed by antimicrobial stewardship pharmacist: [facility] []  Enzo Bi, Pharm.D. []  Celedonio Miyamoto, Pharm.D., BCPS AQ-ID []  Garvin Fila, Pharm.D., BCPS []  Georgina Pillion, [address].D., BCPS []  [place], [address].D., BCPS, AAHIVP []  Estella Husk, Pharm.D., BCPS, AAHIVP []  Lysle Pearl, PharmD, BCPS []  Phillips Climes, PharmD, BCPS []  Agapito Games, PharmD, BCPS []  Verlan Friends, PharmD []  Mervyn Gay, PharmD, BCPS [x]  Melina Modena, PharmD  [facility] []  Len Childs, PharmD []  Greer Pickerel, PharmD []  Adalberto Cole, PharmD []  Perlie Gold, Rph []  Lonell Face) Jean Rosenthal, PharmD []  Earl Many, PharmD []  Junita Push, PharmD []  Dorna Leitz, PharmD []  Terrilee Files, PharmD []  Lynann Beaver, PharmD []  Keturah Barre, PharmD []  Loralee Pacas, PharmD []  Bernadene Person, PharmD   Positive urine culture No further patient follow-up is required at this time.  Abigail Lamb 03/26/2024, 10:10 AM

## 2024-03-29 NOTE — Transitions of Care (Post Inpatient/ED Visit) (Signed)
   03/29/2024  Name: Suleima Ohlendorf MRN: [id number] DOB: Jul 28, 1951  Today's TOC FU Call Status: Today's TOC FU Call Status:: Unsuccessful Call (3rd Attempt) Unsuccessful Call (1st Attempt) Date: 03/24/24 Unsuccessful Call (2nd Attempt) Date: 03/25/24 Unsuccessful Call (3rd Attempt) Date: 03/29/24  Attempted to reach the patient regarding the most recent Inpatient/ED visit.  Follow Up Plan: No further outreach attempts will be made at this time. We have been unable to contact the patient.  Signature Karena Addison, LPN [facility] Nurse Health Advisor Direct Dial [phone]

## 2024-04-04 ENCOUNTER — Encounter: Payer: Medicare Other | Admitting: Internal Medicine

## 2024-04-05 ENCOUNTER — Ambulatory Visit: Admitting: Podiatry

## 2024-04-05 ENCOUNTER — Encounter: Payer: Self-pay | Admitting: Podiatry

## 2024-04-05 DIAGNOSIS — I739 Peripheral vascular disease, unspecified: Secondary | ICD-10-CM | POA: Diagnosis not present

## 2024-04-05 DIAGNOSIS — L97522 Non-pressure chronic ulcer of other part of left foot with fat layer exposed: Secondary | ICD-10-CM | POA: Diagnosis not present

## 2024-04-06 ENCOUNTER — Ambulatory Visit
Admission: RE | Admit: 2024-04-06 | Discharge: 2024-04-06 | Disposition: A | Discharge status: Home / Self Care | Source: Ambulatory Visit | Attending: Podiatry

## 2024-04-06 DIAGNOSIS — L97529 Non-pressure chronic ulcer of other part of left foot with unspecified severity: Secondary | ICD-10-CM | POA: Diagnosis not present

## 2024-04-06 DIAGNOSIS — M79672 Pain in left foot: Secondary | ICD-10-CM | POA: Diagnosis not present

## 2024-04-06 DIAGNOSIS — R936 Abnormal findings on diagnostic imaging of limbs: Secondary | ICD-10-CM | POA: Diagnosis not present

## 2024-04-06 DIAGNOSIS — L97522 Non-pressure chronic ulcer of other part of left foot with fat layer exposed: Secondary | ICD-10-CM

## 2024-04-06 DIAGNOSIS — G8929 Other chronic pain: Secondary | ICD-10-CM | POA: Diagnosis not present

## 2024-04-07 NOTE — Progress Notes (Signed)
 Subjective:   Patient ID: Abigail Lamb, female   DOB: 73 y.o.   MRN: [id number]   HPI Chief Complaint  Patient presents with   Foot Ulcer    RM#11 Left foot ulcer patient states ulcer has changed some but not getting better.     73 year old female presents the office today for follow-up evaluation of wound to her left foot.  States the wounds are doing better.  She is not seeing any drainage or pus.  No increasing swelling or redness.  She is scheduled for an MRI tomorrow.  She has no other concerns today.       Objective:  Physical Exam  General: AAO x3, NAD  Dermatological:  On the left foot ulceration at the left hallux IPJ, left fifth MTPJ with a preulcerative lesion noted on the left third toe.  Overall the wounds appear to be much improved and more superficial.  There is no probing to to bone, undermining or tunneling.  There is localized rim of erythema to the wounds but there is no ascending cellulitis.  There is no increase in temperature.  There is no fluctuation or crepitation.  There is no malodor.  No other open lesions are identified this time.    Vascular: Dorsalis Pedis artery and Posterior Tibial artery pedal pulses are 1/4 bilateral with immedate capillary fill time. There is no pain with calf compression, swelling, warmth, erythema.   Neruologic: Sensation decreased with Semmes Weinstein monofilament.  Musculoskeletal: Digital contractures present.      Assessment:   Ulceration left foot, rheumatoid arthritis     Plan:  -Treatment options discussed including all alternatives, risks, and complications -Etiology of symptoms were discussed -ABIs reviewed.  She was seen by vascular surgery. -Await MRI which is scheduled for tomorrow. -I did debride the wounds today as there is some hyperkeratotic periwound present.  She tolerated well.  Continue with daily dressing changes, offloading. -Monitor for any clinical signs or symptoms of infection and directed to  call the office immediately should any occur or go to the ER.  Return in about 3 weeks (around 04/26/2024) for toe ulcers.  Vivi Barrack DPM

## 2024-04-10 ENCOUNTER — Other Ambulatory Visit: Payer: Self-pay | Admitting: Internal Medicine

## 2024-04-10 DIAGNOSIS — M0579 Rheumatoid arthritis with rheumatoid factor of multiple sites without organ or systems involvement: Secondary | ICD-10-CM

## 2024-04-20 ENCOUNTER — Ambulatory Visit (INDEPENDENT_AMBULATORY_CARE_PROVIDER_SITE_OTHER): Admitting: Internal Medicine

## 2024-04-20 ENCOUNTER — Encounter: Payer: Self-pay | Admitting: Internal Medicine

## 2024-04-20 VITALS — BP 110/76 | HR 80 | Temp 97.4°F | Resp 16 | Ht 61.0 in | Wt 75.6 lb

## 2024-04-20 DIAGNOSIS — L280 Lichen simplex chronicus: Secondary | ICD-10-CM

## 2024-04-20 DIAGNOSIS — J431 Panlobular emphysema: Secondary | ICD-10-CM | POA: Diagnosis not present

## 2024-04-20 DIAGNOSIS — K279 Peptic ulcer, site unspecified, unspecified as acute or chronic, without hemorrhage or perforation: Secondary | ICD-10-CM

## 2024-04-20 MED ORDER — FAMOTIDINE 40 MG PO TABS: 40.0000 mg | ORAL_TABLET | Freq: Two times a day (BID) | ORAL | 1 refills | Status: DC

## 2024-04-20 MED ORDER — ONDANSETRON 4 MG PO TBDP: 4.0000 mg | ORAL_TABLET | Freq: Three times a day (TID) | ORAL | 5 refills | Status: AC | PRN

## 2024-04-20 MED ORDER — PANTOPRAZOLE SODIUM 40 MG PO TBEC
40.0000 mg | DELAYED_RELEASE_TABLET | Freq: Every day | ORAL | 1 refills | Status: DC
Start: 2024-04-20 — End: 2024-08-04

## 2024-04-20 MED ORDER — PREGABALIN 25 MG PO CAPS
25.0000 mg | ORAL_CAPSULE | Freq: Three times a day (TID) | ORAL | 1 refills | Status: DC
Start: 2024-04-20 — End: 2024-09-16

## 2024-04-20 NOTE — Progress Notes (Signed)
 Subjective:  Patient ID: Abigail Lamb, female    DOB: [birth year]  Age: 73 y.o. MRN: [id number]  CC: Gastroesophageal Reflux and COPD   HPI Abigail Lamb presents for f/up -----  Discussed the use of AI scribe software for clinical note transcription with the patient, who gave verbal consent to proceed.  History of Present Illness   She is a 73 year old female who presents with persistent nausea and foot issues.  She experiences persistent nausea, primarily in the mornings, often after waking up and attempting to start her day with coffee. She takes medication for nausea approximately once a day. Her appetite is variable, sometimes coming and going. She has undergone a CT scan of her abdomen and is scheduled for a colonoscopy.  She has a history of foot issues, including a lesion on the side of her foot that has been evaluated by another physician. She has been using a prescribed cream and bandages on the lesion, which initially showed fat tissue but has since closed up. However, she notes that the lesion seems to be getting larger. Additionally, her toes have started curling under, making it difficult for her to clip her toenails, requiring assistance from others. She has dry skin on her feet, which she is actively trying to manage. She is scheduled for an MRI of her foot and has had previous x-rays and films as part of the workup.  She reports symptoms of coughing, wheezing, chest pain, shortness of breath, and abdominal pain, which have not fully resolved and tend to come and go.   She requests a medication for scalp neurodermatitis.       Outpatient Medications Prior to Visit  Medication Sig Dispense Refill   albuterol (VENTOLIN HFA) 108 (90 Base) MCG/ACT inhaler Take 1-2 puffs every 4-6 hrs prn shortness of breath. Needs ventolin.     budeson-glycopyrrolate-formoterol (BREZTRI  AEROSPHERE) 160-9-4.8 MCG/ACT AERO Inhale 2 puffs into the lungs in the morning and at bedtime.      celecoxib  (CELEBREX ) 50 MG capsule TAKE 1 CAPSULE BY MOUTH 2 TIMES DAILY. 60 capsule 1   Cholecalciferol (VITAMIN D3) 125 MCG (5000 UT) CAPS Take 5,000 Units by mouth daily.     ciclopirox  (PENLAC ) 8 % solution Apply topically at bedtime. Apply over nail and surrounding skin. Apply daily over previous coat. After seven (7) days, may remove with alcohol and continue cycle. 6.6 mL 2   clindamycin (CLEOCIN T) 1 % external solution as needed.      clobetasol (OLUX) 0.05 % topical foam Apply topically as needed.     clonazePAM  (KLONOPIN ) 0.5 MG tablet Take 1 tablet (0.5 mg total) by mouth 2 (two) times daily as needed for anxiety. 60 tablet 2   colesevelam  (WELCHOL ) 625 MG tablet TAKE 3 TABLETS BY MOUTH TWICE A DAY WITH MEAL (Patient taking differently: 4 tablets twice daily.) 180 tablet 3   dicyclomine  (BENTYL ) 10 MG capsule TAKE 1 CAPSULE (10 MG TOTAL) BY MOUTH 4 TIMES A DAY BEFORE MEALS AND AT BEDTIME 360 capsule 0   DULoxetine  (CYMBALTA ) 30 MG capsule Take 3 capsules (90 mg total) by mouth daily. For total of 90 mg daily 270 capsule 0   fluocinonide -emollient (LIDEX -E) 0.05 % cream Apply 1 application topically 2 (two) times daily. (Patient taking differently: Apply 1 application  topically as needed.) 60 g 3   fluticasone  (FLONASE ) 50 MCG/ACT nasal spray Place 1 spray into both nostrils daily. (Patient taking differently: Place 1 spray into both nostrils as needed.) 16 g 11  folic acid (FOLVITE) 1 MG tablet Take by mouth.     hydroxychloroquine  (PLAQUENIL ) 200 MG tablet Take 1 tablet (200 mg total) by mouth daily.     mupirocin  ointment (BACTROBAN ) 2 % Apply 1 Application topically 2 (two) times daily. (Patient not taking: Reported on 04/21/2024) 30 g 2   sulfaSALAzine  (AZULFIDINE ) 500 MG tablet TAKE 2 TABLETS BY MOUTH TWICE A DAY 120 tablet 2   Zoledronic  Acid (RECLAST  IV) Inject into the vein. Infusion: 12/03/2023     famotidine  (PEPCID ) 40 MG tablet TAKE 1 TABLET BY MOUTH TWICE A DAY 180 tablet 1    ondansetron  (ZOFRAN -ODT) 4 MG disintegrating tablet Take 1 tablet (4 mg total) by mouth every 8 (eight) hours as needed for nausea or vomiting. 20 tablet 0   pantoprazole  (PROTONIX ) 40 MG tablet Take 1 tablet (40 mg total) by mouth daily for 14 days. 14 tablet 0   amoxicillin -clavulanate (AUGMENTIN ) 875-125 MG tablet Take 1 tablet by mouth 2 (two) times daily. 28 tablet 0   budeson-glycopyrrolate-formoterol (BREZTRI  AEROSPHERE) 160-9-4.8 MCG/ACT AERO 2 Samples 2 each    hydroxychloroquine  (PLAQUENIL ) 200 MG tablet TAKE 1 TABLET BY MOUTH TWICE A DAY 180 tablet 0   loperamide  (IMODIUM ) 2 MG capsule Take 1 capsule (2 mg total) by mouth 4 (four) times daily as needed for diarrhea or loose stools. 12 capsule 0   No facility-administered medications prior to visit.    ROS Review of Systems  Constitutional:  Negative for appetite change, diaphoresis, fatigue and fever.  HENT: Negative.    Eyes: Negative.   Respiratory:  Positive for shortness of breath. Negative for cough, chest tightness and wheezing.   Cardiovascular:  Negative for chest pain, palpitations and leg swelling.  Gastrointestinal:  Positive for nausea. Negative for abdominal pain, blood in stool, constipation and vomiting.  Endocrine: Negative.  Negative for polyuria.  Genitourinary: Negative.  Negative for difficulty urinating.  Musculoskeletal:  Positive for arthralgias. Negative for myalgias.  Skin: Negative.   Neurological: Negative.  Negative for dizziness, weakness and numbness.  Hematological:  Negative for adenopathy. Does not bruise/bleed easily.  Psychiatric/Behavioral:  Positive for confusion and decreased concentration. Negative for sleep disturbance. The patient is not nervous/anxious.     Objective:  BP 110/76 (BP Location: Left Arm, Patient Position: Sitting, Cuff Size: Small)   Pulse 80   Temp (!) 97.4 F (36.3 C) (Oral)   Resp 16   Ht 5\' 1"  (1.549 m)   Wt 75 lb 9.6 oz (34.3 kg)   SpO2 94%   BMI 14.28  kg/m   BP Readings from Last 3 Encounters:  04/20/24 110/76  03/23/24 105/61  03/16/24 111/68    Wt Readings from Last 3 Encounters:  04/21/24 75 lb (34 kg)  04/20/24 75 lb 9.6 oz (34.3 kg)  03/22/24 76 lb (34.5 kg)    Physical Exam Vitals reviewed.  Constitutional:      General: She is not in acute distress.    Appearance: She is cachectic. She is ill-appearing. She is not toxic-appearing or diaphoretic.  HENT:     Nose: Nose normal.     Mouth/Throat:     Mouth: Mucous membranes are moist.  Eyes:     General: No scleral icterus.    Conjunctiva/sclera: Conjunctivae normal.  Cardiovascular:     Rate and Rhythm: Normal rate and regular rhythm.     Heart sounds: No murmur heard.    No friction rub. No gallop.  Pulmonary:     Effort: Pulmonary  effort is normal.     Breath sounds: No stridor. No wheezing, rhonchi or rales.  Abdominal:     General: Abdomen is scaphoid. Bowel sounds are normal. There is no distension.     Palpations: Abdomen is soft. There is no hepatomegaly, splenomegaly or mass.     Tenderness: There is no abdominal tenderness.  Musculoskeletal:        General: Normal range of motion.     Cervical back: Neck supple.     Right lower leg: No edema.     Left lower leg: No edema.  Lymphadenopathy:     Cervical: No cervical adenopathy.  Skin:    General: Skin is warm and dry.     Findings: No rash.     Comments: There are no scalp lesions  Neurological:     General: No focal deficit present.     Mental Status: Mental status is at baseline.  Psychiatric:        Mood and Affect: Mood normal.        Behavior: Behavior normal.     Lab Results  Component Value Date   WBC 12.8 (H) 03/22/2024   HGB 15.8 (H) 03/22/2024   HCT 47.9 (H) 03/22/2024   PLT 248 03/22/2024   GLUCOSE 118 (H) 03/23/2024   CHOL 164 10/27/2022   TRIG 132.0 10/27/2022   HDL 66.20 10/27/2022   LDLCALC 71 10/27/2022   ALT 22 03/22/2024   AST 31 03/22/2024   NA 137 03/23/2024    K 3.6 03/23/2024   CL 106 03/23/2024   CREATININE 0.76 03/23/2024   BUN 20 03/23/2024   CO2 22 03/23/2024   TSH 4.12 05/22/2023   HGBA1C 5.5 08/02/2020    CT ABDOMEN PELVIS W CONTRAST Result Date: 03/22/2024 CLINICAL DATA:  Left lower quadrant pain EXAM: CT ABDOMEN AND PELVIS WITH CONTRAST TECHNIQUE: Multidetector CT imaging of the abdomen and pelvis was performed using the standard protocol following bolus administration of intravenous contrast. RADIATION DOSE REDUCTION: This exam was performed according to the departmental dose-optimization program which includes automated exposure control, adjustment of the mA and/or kV according to patient size and/or use of iterative reconstruction technique. CONTRAST:  75mL OMNIPAQUE  IOHEXOL  350 MG/ML SOLN COMPARISON:  None Available. FINDINGS: Lower chest: Lung bases demonstrate emphysematous changes. No focal infiltrate is seen. Hepatobiliary: No focal liver abnormality is seen. No gallstones, gallbladder wall thickening, or biliary dilatation. Pancreas: Unremarkable. No pancreatic ductal dilatation or surrounding inflammatory changes. Spleen: Normal in size without focal abnormality. Adrenals/Urinary Tract: Right adrenal gland is within normal limits. Left adrenal gland again demonstrates a prominent lesion measuring 2.0 x 1.3 cm in greatest dimension. This is stable dating back to 2022 and felt to be benign in etiology. No further follow-up is recommended. Kidneys demonstrate a normal enhancement pattern bilaterally. Punctate nonobstructing left renal calculi are noted. No ureteral stone is seen. The bladder is partially distended. Stomach/Bowel: No obstructive or inflammatory changes of the colon are seen. The appendix is not well seen although no inflammatory changes to suggest appendicitis are noted. Small bowel and stomach are within normal limits. Vascular/Lymphatic: Aortic atherosclerosis. No enlarged abdominal or pelvic lymph nodes. Reproductive: Uterus  demonstrates some small partially calcified fibroids. No adnexal mass is noted. Other: No abdominal wall hernia or abnormality. No abdominopelvic ascites. Musculoskeletal: No acute or significant osseous findings. IMPRESSION: 1. Stable left adrenal lesion. 2. Punctate nonobstructing left renal calculi. 3. No other focal abnormality is noted. 4. Aortic atherosclerosis. Electronically Signed   By:  Violeta Grey M.D.   On: 03/22/2024 22:42   DG Chest 2 View Result Date: 03/22/2024 CLINICAL DATA:  Chest pain for 3 days. EXAM: CHEST - 2 VIEW COMPARISON:  November 17, 2023. FINDINGS: The heart size and mediastinal contours are within normal limits. Both lungs are clear. Hyperexpansion of the lungs is noted. The visualized skeletal structures are unremarkable. IMPRESSION: No active cardiopulmonary disease. Hyperexpansion of the lungs suggesting COPD. Emphysema (ICD10-J43.9). Electronically Signed   By: Rosalene Colon M.D.   On: 03/22/2024 20:42   DG Foot Complete Left Result Date: 03/11/2024 Please see detailed radiograph report in office note.  VAS US  ABI WITH/WO TBI Result Date: 03/09/2024  LOWER EXTREMITY DOPPLER STUDY Patient Name:  Samamtha Dennen  Date of Exam:   03/09/2024 Medical Rec #: [id number]       Accession #:    [id number] Date of Birth: [birth year]      Patient Gender: F Patient Age:   67 years Exam Location:  [facility] Procedure:      VAS US  ABI WITH/WO TBI Referring Phys: Bobbie Burows --------------------------------------------------------------------------------  Indications: Toe ulcer, left, with fat layer exposed  Performing Technologist: Aloma Arrant RVS  Examination Guidelines: A complete evaluation includes at minimum, Doppler waveform signals and systolic blood pressure reading at the level of bilateral brachial, anterior tibial, and posterior tibial arteries, when vessel segments are accessible. Bilateral testing is considered an integral part of a complete  examination. Photoelectric Plethysmograph (PPG) waveforms and toe systolic pressure readings are included as required and additional duplex testing as needed. Limited examinations for reoccurring indications may be performed as noted.  ABI Findings: +---------+------------------+-----+-----------+--------+ Right    Rt Pressure (mmHg)IndexWaveform   Comment  +---------+------------------+-----+-----------+--------+ Brachial 111                                        +---------+------------------+-----+-----------+--------+ PTA      147               1.18 multiphasic         +---------+------------------+-----+-----------+--------+ DP       139               1.11 biphasic            +---------+------------------+-----+-----------+--------+ Great Toe                       Absent              +---------+------------------+-----+-----------+--------+ +---------+------------------+-----+-----------+-------+ Left     Lt Pressure (mmHg)IndexWaveform   Comment +---------+------------------+-----+-----------+-------+ Brachial 125                                       +---------+------------------+-----+-----------+-------+ PTA      145               1.16 multiphasic        +---------+------------------+-----+-----------+-------+ DP       119               0.95 biphasic           +---------+------------------+-----+-----------+-------+ Great Toe                       Absent             +---------+------------------+-----+-----------+-------+ +-------+-----------+-----------+------------+------------+  ABI/TBIToday's ABIToday's TBIPrevious ABIPrevious TBI +-------+-----------+-----------+------------+------------+ Right  1.18       0.0                                 +-------+-----------+-----------+------------+------------+ Left   1.16       0.0                                 +-------+-----------+-----------+------------+------------+  Summary:  Right: Resting right ankle-brachial index is within normal range. The right toe-brachial index is abnormal. Left: Resting left ankle-brachial index is within normal range. The left toe-brachial index is abnormal. *See table(s) above for measurements and observations.  Electronically signed by Angela Kell MD on 03/09/2024 at 5:15:09 PM.    Final    DG Foot Complete Right Result Date: 02/29/2024 Please see detailed radiograph report in office note.  DG Foot Complete Left Result Date: 02/29/2024 Please see detailed radiograph report in office note.    Assessment & Plan:   Panlobular emphysema (HCC) - Doing well with Breztri .  PUD (peptic ulcer disease) -     Ondansetron ; Take 1 tablet (4 mg total) by mouth every 8 (eight) hours as needed for nausea or vomiting.  Dispense: 20 tablet; Refill: 5 -     Famotidine ; Take 1 tablet (40 mg total) by mouth 2 (two) times daily.  Dispense: 180 tablet; Refill: 1 -     Pantoprazole  Sodium; Take 1 tablet (40 mg total) by mouth daily.  Dispense: 90 tablet; Refill: 1  Neurodermatitis -     Pregabalin ; Take 1 capsule (25 mg total) by mouth 3 (three) times daily. (Patient not taking: Reported on 04/21/2024)  Dispense: 270 capsule; Refill: 1     Follow-up: No follow-ups on file.  Sandra Crouch, MD

## 2024-04-21 ENCOUNTER — Telehealth: Payer: Self-pay | Admitting: *Deleted

## 2024-04-21 ENCOUNTER — Encounter

## 2024-04-21 ENCOUNTER — Ambulatory Visit: Admitting: *Deleted

## 2024-04-21 VITALS — Ht 61.5 in | Wt 75.0 lb

## 2024-04-21 DIAGNOSIS — Z860100 Personal history of colon polyps, unspecified: Secondary | ICD-10-CM

## 2024-04-21 MED ORDER — NA SULFATE-K SULFATE-MG SULF 17.5-3.13-1.6 GM/177ML PO SOLN: 1.0000 | Freq: Once | ORAL | 0 refills | Status: AC

## 2024-04-21 NOTE — Progress Notes (Signed)
 Pt's name and DOB verified at the beginning of the pre-visit wit 2 identifiers  Pt denies any difficulty with ambulating,sitting, laying down or rolling side to side  Pt has no issues with ambulation   Pt has no issues moving head neck or swallowing  No egg or soy allergy known to patient   No issues known to pt with past sedation with any surgeries or procedures  Pt denies having issues being intubated  No FH of Malignant Hyperthermia  Pt is not on diet pills or shots  Pt is not on home 02   Pt is not on blood thinners   Pt denies issues with constipation   Pt is not on dialysis  Pt denise any abnormal heart rhythms   Pt denies any upcoming cardiac testing  Chart not reviewed by CRNA prior to PV  Visit by phone  Pt states weight is 75 lb  IInstructions reviewed. Pt given , [facility] main # and MD on call # prior to instructions.  Pt states understanding of instructions. Instructed to review again prior to procedure. Pt states they will.   I

## 2024-04-26 ENCOUNTER — Ambulatory Visit: Admitting: Podiatry

## 2024-04-29 ENCOUNTER — Encounter: Payer: Self-pay | Admitting: Podiatry

## 2024-04-29 ENCOUNTER — Ambulatory Visit (INDEPENDENT_AMBULATORY_CARE_PROVIDER_SITE_OTHER): Admitting: Podiatry

## 2024-04-29 DIAGNOSIS — L97522 Non-pressure chronic ulcer of other part of left foot with fat layer exposed: Secondary | ICD-10-CM

## 2024-04-29 DIAGNOSIS — M86272 Subacute osteomyelitis, left ankle and foot: Secondary | ICD-10-CM

## 2024-04-29 MED ORDER — AMOXICILLIN-POT CLAVULANATE 875-125 MG PO TABS: 1.0000 | ORAL_TABLET | Freq: Two times a day (BID) | ORAL | 0 refills | Status: DC

## 2024-05-03 NOTE — Progress Notes (Signed)
 Subjective:   Patient ID: Abigail Lamb, female   DOB: 73 y.o.   MRN: [id number]   HPI Chief Complaint  Patient presents with   Foot Ulcer    RM#13 Left foot ulcers follow up patient has noticed pain increase.     73 year old female presents the office today for follow-up evaluation of wound to her left foot.  She thinks they are healing in.  She does get occasional pain but not consistently.  Denies any drainage or pus.  Denies any fevers or chills.       Objective:  Physical Exam  General: AAO x3, NAD  Dermatological:  On the left foot ulceration at the left hallux IPJ, left fifth MTPJ with a preulcerative lesion noted on the left third toe.  There is hyperkeratotic tissue overlying the area of the wound upon debridement superficial area of skin breakdown to the fifth MPJ as well as hallux.  There is no probing to bone, undermining or tunneling.  There is no surrounding erythema, ascending cellulitis.  There is no drainage or pus.  No fluctuation, crepitation, malodor.   Vascular: Dorsalis Pedis artery and Posterior Tibial artery pedal pulses are 1/4 bilateral with immedate capillary fill time. There is no pain with calf compression, swelling, warmth, erythema.   Neruologic: Sensation decreased with Semmes Weinstein monofilament.  Musculoskeletal: Digital contractures present.      Assessment:   Ulceration left foot, rheumatoid arthritis     Plan:  -Treatment options discussed including all alternatives, risks, and complications -Etiology of symptoms were discussed -ABIs reviewed.  She was seen by vascular surgery. - I reviewed the MRI with the patient.  Cannot completely rule out osteomyelitis.  I do think it is more coming from her rheumatoid, erosions.  I ordered a CBC, sed rate, CRP -Amoxicillin  -I sharply debrided the hyperkeratotic lesions to reveal underlying ulcerations which are superficial.  I recommend continue with dressing changes daily and  offloading. -Monitor for any clinical signs or symptoms of infection and directed to call the office immediately should any occur or go to the ER.  Return in about 3 weeks (around 05/20/2024) for toe ulcer left, x-ray.  Charity Conch DPM

## 2024-05-04 ENCOUNTER — Encounter: Payer: Self-pay | Admitting: Internal Medicine

## 2024-05-09 MED ORDER — CELECOXIB 50 MG PO CAPS: 50.0000 mg | ORAL_CAPSULE | Freq: Two times a day (BID) | ORAL | 1 refills | Status: DC

## 2024-05-09 NOTE — Addendum Note (Signed)
 Addended by: Skylynn Burkley E on: 05/09/2024 04:05 PM   Modules accepted: Orders

## 2024-05-17 ENCOUNTER — Encounter: Admitting: Internal Medicine

## 2024-05-18 ENCOUNTER — Ambulatory Visit (INDEPENDENT_AMBULATORY_CARE_PROVIDER_SITE_OTHER)
Admission: RE | Admit: 2024-05-18 | Discharge: 2024-05-18 | Disposition: A | Discharge status: Home / Self Care | Source: Ambulatory Visit | Attending: Internal Medicine | Admitting: Internal Medicine

## 2024-05-18 DIAGNOSIS — Z780 Asymptomatic menopausal state: Secondary | ICD-10-CM | POA: Diagnosis not present

## 2024-05-20 ENCOUNTER — Ambulatory Visit (INDEPENDENT_AMBULATORY_CARE_PROVIDER_SITE_OTHER)

## 2024-05-20 ENCOUNTER — Ambulatory Visit: Admitting: Podiatry

## 2024-05-20 DIAGNOSIS — L97522 Non-pressure chronic ulcer of other part of left foot with fat layer exposed: Secondary | ICD-10-CM | POA: Diagnosis not present

## 2024-05-22 ENCOUNTER — Other Ambulatory Visit: Payer: Self-pay | Admitting: Internal Medicine

## 2024-05-22 ENCOUNTER — Ambulatory Visit: Payer: Self-pay | Admitting: Internal Medicine

## 2024-05-22 DIAGNOSIS — F3341 Major depressive disorder, recurrent, in partial remission: Secondary | ICD-10-CM

## 2024-05-24 NOTE — Progress Notes (Signed)
 Subjective:   Patient ID: Abigail Lamb, female   DOB: 73 y.o.   MRN: [id number]   HPI Chief Complaint  Patient presents with   Foot Ulcer    Patient is here for left foot ulcer.      73 year old female presents the office today for follow-up evaluation of wound to her left foot.  States that she is still change the dressings daily.  She has not seen any drainage or pus.  She does not report any fevers or chills.       Objective:  Physical Exam  General: AAO x3, NAD  Dermatological:  On the left foot ulceration at the left hallux IPJ, left fifth MTPJ with a preulcerative lesion noted on the left third toe.  On the 1st and 5th toe there is hyperkeratotic tissue along the area of the wounds.  Once I debrided this is a superficial area skin breakdown there is no probing to bone, undermining or tunneling.  There is no fluctuation or crepitation.  There is no malodor.         Vascular: Dorsalis Pedis artery and Posterior Tibial artery pedal pulses are 1/4 bilateral with immedate capillary fill time. There is no pain with calf compression, swelling, warmth, erythema.   Neruologic: Sensation decreased with Semmes Weinstein monofilament.  Musculoskeletal: Digital contractures present.      Assessment:   Ulceration left foot, rheumatoid arthritis     Plan:  -Treatment options discussed including all alternatives, risks, and complications -Etiology of symptoms were discussed -Repeat x-rays were obtained and reviewed.  There is no definitive cortical changes suggest also notes number to prior x-rays.  There are erosions of the fifth MTPJ and narrowing of the IPJ's of the toes.  This is more coming from her arthritis as opposed to infection. -ABIs reviewed.  She was seen by vascular surgery. -Postoperatively the hyperkeratotic lesions overlying the area of the wounds with any complications or bleeding.  No purulence or signs of infection.  Recommend continue daily dressing  changes, offloading at all times. - Unfortunately still at risk of amputation given ongoing ulceration -Monitor for any clinical signs or symptoms of infection and directed to call the office immediately should any occur or go to the ER.  Return in about 3 weeks (around 06/10/2024) for ulcer.  Charity Conch DPM

## 2024-05-28 ENCOUNTER — Other Ambulatory Visit: Payer: Self-pay | Admitting: Internal Medicine

## 2024-05-31 ENCOUNTER — Telehealth: Payer: Self-pay | Admitting: Emergency Medicine

## 2024-05-31 MED ORDER — BREZTRI AEROSPHERE 160-9-4.8 MCG/ACT IN AERO
INHALATION_SPRAY | RESPIRATORY_TRACT | Status: DC
Start: 2024-05-31 — End: 2024-07-07

## 2024-05-31 NOTE — Telephone Encounter (Signed)
 Pts daughter presented in office requesting samples of Breztri  for pt as she is out. Per RB previous encounter he is OK with giving pt samples. I provided 2 samples of Breztri .

## 2024-05-31 NOTE — Telephone Encounter (Signed)
 Daughter dropped off AZ&[employer] application for Breztri . It has been placed in the pharmacy box.

## 2024-06-01 NOTE — Telephone Encounter (Signed)
 Inhaler PAP is managed by provider's CMA. Will be placed back in Dr. Lacinda Pica mailbox

## 2024-06-06 ENCOUNTER — Inpatient Hospital Stay: Payer: Medicare Other | Attending: Hematology and Oncology

## 2024-06-06 DIAGNOSIS — Z79899 Other long term (current) drug therapy: Secondary | ICD-10-CM | POA: Diagnosis not present

## 2024-06-06 DIAGNOSIS — F17210 Nicotine dependence, cigarettes, uncomplicated: Secondary | ICD-10-CM | POA: Diagnosis not present

## 2024-06-06 DIAGNOSIS — D472 Monoclonal gammopathy: Secondary | ICD-10-CM | POA: Diagnosis not present

## 2024-06-06 LAB — CBC WITH DIFFERENTIAL (CANCER CENTER ONLY)
Abs Immature Granulocytes: 0.04 10*3/uL (ref 0.00–0.07)
Basophils Absolute: 0.1 10*3/uL (ref 0.0–0.1)
Basophils Relative: 1 %
Eosinophils Absolute: 0.2 10*3/uL (ref 0.0–0.5)
Eosinophils Relative: 2 %
HCT: 44.5 % (ref 36.0–46.0)
Hemoglobin: 14.7 g/dL (ref 12.0–15.0)
Immature Granulocytes: 0 %
Lymphocytes Relative: 28 %
Lymphs Abs: 2.8 10*3/uL (ref 0.7–4.0)
MCH: 31.4 pg (ref 26.0–34.0)
MCHC: 33 g/dL (ref 30.0–36.0)
MCV: 95.1 fL (ref 80.0–100.0)
Monocytes Absolute: 0.8 10*3/uL (ref 0.1–1.0)
Monocytes Relative: 8 %
Neutro Abs: 5.9 10*3/uL (ref 1.7–7.7)
Neutrophils Relative %: 61 %
Platelet Count: 247 10*3/uL (ref 150–400)
RBC: 4.68 MIL/uL (ref 3.87–5.11)
RDW: 14.1 % (ref 11.5–15.5)
WBC Count: 9.9 10*3/uL (ref 4.0–10.5)
nRBC: 0 % (ref 0.0–0.2)

## 2024-06-06 LAB — CMP (CANCER CENTER ONLY)
ALT: 21 U/L (ref 0–44)
AST: 28 U/L (ref 15–41)
Albumin: 3.9 g/dL (ref 3.5–5.0)
Alkaline Phosphatase: 109 U/L (ref 38–126)
Anion gap: 6 (ref 5–15)
BUN: 22 mg/dL (ref 8–23)
CO2: 29 mmol/L (ref 22–32)
Calcium: 9.4 mg/dL (ref 8.9–10.3)
Chloride: 103 mmol/L (ref 98–111)
Creatinine: 0.58 mg/dL (ref 0.44–1.00)
GFR, Estimated: >60 mL/min (ref >60)
Glucose, Bld: 76 mg/dL (ref 70–99)
Potassium: 4.2 mmol/L (ref 3.5–5.1)
Sodium: 138 mmol/L (ref 135–145)
Total Bilirubin: 0.3 mg/dL (ref 0.0–1.2)
Total Protein: 7.6 g/dL (ref 6.5–8.1)

## 2024-06-06 LAB — LACTATE DEHYDROGENASE: LDH: 225 U/L — ABNORMAL HIGH (ref 98–192)

## 2024-06-07 LAB — KAPPA/LAMBDA LIGHT CHAINS
Kappa free light chain: 50.6 mg/L — ABNORMAL HIGH (ref 3.3–19.4)
Kappa, lambda light chain ratio: 2.08 — ABNORMAL HIGH (ref 0.26–1.65)
Lambda free light chains: 24.3 mg/L (ref 5.7–26.3)

## 2024-06-08 ENCOUNTER — Other Ambulatory Visit: Payer: Self-pay

## 2024-06-08 MED ORDER — BREZTRI AEROSPHERE 160-9-4.8 MCG/ACT IN AERO: 2.0000 | INHALATION_SPRAY | Freq: Two times a day (BID) | RESPIRATORY_TRACT | 3 refills | Status: DC

## 2024-06-09 LAB — MULTIPLE MYELOMA PANEL, SERUM
Albumin SerPl Elph-Mcnc: 3.3 g/dL (ref 2.9–4.4)
Albumin/Glob SerPl: 0.9 (ref 0.7–1.7)
Alpha 1: 0.3 g/dL (ref 0.0–0.4)
Alpha2 Glob SerPl Elph-Mcnc: 0.7 g/dL (ref 0.4–1.0)
B-Globulin SerPl Elph-Mcnc: 1.2 g/dL (ref 0.7–1.3)
Gamma Glob SerPl Elph-Mcnc: 1.7 g/dL (ref 0.4–1.8)
Globulin, Total: 3.8 g/dL (ref 2.2–3.9)
IgA: 537 mg/dL — ABNORMAL HIGH (ref 64–422)
IgG (Immunoglobin G), Serum: 1118 mg/dL (ref 586–1602)
IgM (Immunoglobulin M), Srm: 1026 mg/dL — ABNORMAL HIGH (ref 26–217)
M Protein SerPl Elph-Mcnc: 0.8 g/dL — ABNORMAL HIGH
Total Protein ELP: 7.1 g/dL (ref 6.0–8.5)

## 2024-06-13 ENCOUNTER — Ambulatory Visit (INDEPENDENT_AMBULATORY_CARE_PROVIDER_SITE_OTHER): Admitting: Podiatry

## 2024-06-13 DIAGNOSIS — L97522 Non-pressure chronic ulcer of other part of left foot with fat layer exposed: Secondary | ICD-10-CM | POA: Diagnosis not present

## 2024-06-13 NOTE — Patient Instructions (Signed)
 Wash the foot with soap and water, dry well. Apply the ointment followed a bandage, change daily. Wear surgical shoe to help decrease pressure  Monitor for any signs/symptoms of infection. Call the office immediately if any occur or go directly to the emergency room. Call with any questions/concerns.

## 2024-06-13 NOTE — Progress Notes (Unsigned)
 Subjective:   Patient ID: Abigail Lamb, female   DOB: 73 y.o.   MRN: [id number]   HPI Chief Complaint  Patient presents with   Foot Ulcer    RM#11 Follow up left foot ulcer patient states is doing better.    73 year old female presents the office today for follow-up evaluation of wound to her left foot.  States that she is still change the dressings daily.  She does not any drainage or pus or increasing swelling or redness.  No fevers or chills that she reports.  No other concerns.      Objective:  Physical Exam  General: AAO x3, NAD  Dermatological: The wound was present on the left fifth MPJ hyperkeratotic tissue upon debridement.  The wound is healed.  There is no drainage or pus.  There is still superficial wound noted on the left third toe and also ulceration of the left hallux on the IPJ.  There is localized rim of erythema but there is no ascending cellulitis to these 2 wounds.  There is no drainage or pus.  There is no fluctuation or crepitation.  There is no malodor. No new ulcerations noted.   Vascular: Dorsalis Pedis artery and Posterior Tibial artery pedal pulses are 1/4 bilateral with immedate capillary fill time. There is no pain with calf compression, swelling, warmth, erythema.   Neruologic: Sensation decreased with Semmes Weinstein monofilament.  Musculoskeletal: Digital contractures present.      Assessment:   Ulceration left foot, rheumatoid arthritis     Plan:  -Treatment options discussed including all alternatives, risks, and complications -Etiology of symptoms were discussed -Additional debridements daily #3 total scalp without any complications or bleeding.  Continue antibiotic ointment dressing changes daily as well as offloading. -We discussed smoking sensation to help facilitate healing wounds. -She has previously been evaluated by vascular surgery. -Continue offloading -Monitor for any clinical signs or symptoms of infection and directed to call  the office immediately should any occur or go to the ER.  Return in about 2 weeks (around 06/27/2024) for toe ulcer. X-ray next appointment   Charity Conch DPM

## 2024-06-15 ENCOUNTER — Inpatient Hospital Stay: Payer: Medicare Other | Admitting: Physician Assistant

## 2024-06-15 VITALS — BP 124/69 | HR 102 | Temp 97.2°F | Resp 16 | Wt 74.0 lb

## 2024-06-15 DIAGNOSIS — F17210 Nicotine dependence, cigarettes, uncomplicated: Secondary | ICD-10-CM | POA: Diagnosis not present

## 2024-06-15 DIAGNOSIS — D472 Monoclonal gammopathy: Secondary | ICD-10-CM

## 2024-06-15 DIAGNOSIS — Z79899 Other long term (current) drug therapy: Secondary | ICD-10-CM | POA: Diagnosis not present

## 2024-06-15 NOTE — Progress Notes (Signed)
 [facility] [phone]   Fax:([phone]  PROGRESS NOTE  Patient Care Team: Arcadio Knuckles, MD as PCP - General (Internal Medicine) Thurmon Florida., MD (Ophthalmology)  Hematological/Oncological History # IgM Kappa Monoclonal Gammopathy of Undetermined Significance 1) 09/17/2020: SPEP showed M protein 0.8 with IgM specificity on IFE. Found during rheumatologist evaluation for RA.  2) 10/12/2020: establish care with Dr. Rosaline Coma. M protein 0.6, IgM kappa specificity. Kappa 49.3, Lambda 20.3, ratio 2.43.   3) 10/24/2020: Bone marrow biopsy revealed normocellular marrow with polytypic plasmacytosis (5%).   Interval History:  Abigail Lamb 73 y.o. female with medical history significant for IgM kappa monoclonal gammopathy who presents for a follow up visit. The patient's last visit was on 12/16/2023. In the interim since the last visit she has had no major changes in her health.  On exam today Abigail Lamb reports having ongoing fatigue that is intermittent. She reports her appetite is stable without any noted weight loss. She does experience nausea and vomiting that improves with zofran  as needed. She denies any bowel habit changes and shares that she is scheduled for a colonoscopy next month. She has ongoing left should pain that is new. She is under the care of wound management for ulceration of the left foot. She has some vertigo but denies any syncopal episodes. She is not having any fevers, chills, sweat, shortness of breath, chest pain or cough.  A full 10 point ROS is listed below.  MEDICAL HISTORY:  Past Medical History:  Diagnosis Date   Arthritis    Celiac disease    Encephalopathy    As teenager   Gastroparesis    GERD (gastroesophageal reflux disease)    Hyperlipidemia    Hypertension    IBS (irritable bowel syndrome)    MGUS (monoclonal gammopathy of unknown significance)    per patient    MGUS (monoclonal gammopathy of unknown  significance)    Osteoporosis    per patient, diagnosed by Dr. Dama Duffel in [facility]   PUD (peptic ulcer disease)    Pulmonary emphysema (HCC)    Recurrent depression (HCC)    Smoker     SURGICAL HISTORY: Past Surgical History:  Procedure Laterality Date   ABCESS DRAINAGE Left    under left arm   COLONOSCOPY     spinal tap     age 66   TONSILLECTOMY  51    SOCIAL HISTORY: Social History   Socioeconomic History   Marital status: Divorced    Spouse name: Not on file   Number of children: 3   Years of education: Not on file   Highest education level: Associate degree: occupational, Scientist, product/process development, or vocational program  Occupational History   Occupation: RETIRED  Tobacco Use   Smoking status: Every Day    Current packs/day: 0.75    Average packs/day: 0.8 packs/day for 49.0 years (36.8 ttl pk-yrs)    Types: Cigarettes    Passive exposure: Current   Smokeless tobacco: Never  Vaping Use   Vaping status: Never Used  Substance and Sexual Activity   Alcohol use: Not Currently   Drug use: Never   Sexual activity: Not Currently    Partners: Male  Other Topics Concern   Not on file  Social History Narrative   Son and his girlfriend Lives with her-2025 and a dog   Social Drivers of Health   Financial Resource Strain: High Risk (01/21/2024)   Overall Financial Resource Strain (CARDIA)    Difficulty of Paying  Living Expenses: Hard  Food Insecurity: Food Insecurity Present (01/21/2024)   Hunger Vital Sign    Worried About Running Out of Food in the Last Year: Sometimes true    Ran Out of Food in the Last Year: Sometimes true  Transportation Needs: Unmet Transportation Needs (01/21/2024)   PRAPARE - Transportation    Lack of Transportation (Medical): Yes    Lack of Transportation (Non-Medical): Yes  Physical Activity: Unknown (01/21/2024)   Exercise Vital Sign    Days of Exercise per Week: 0 days    Minutes of Exercise per Session: Not on file  Stress: Stress Concern Present  (01/21/2024)   [employer] of Occupational Health - Occupational Stress Questionnaire    Feeling of Stress : Rather much  Social Connections: Socially Isolated (01/21/2024)   Social Connection and Isolation Panel    Frequency of Communication with Friends and Family: More than three times a week    Frequency of Social Gatherings with Friends and Family: More than three times a week    Attends Religious Services: Never    Database administrator or Organizations: No    Attends Banker Meetings: Not on file    Marital Status: Widowed  Intimate Partner Violence: Patient Unable To Answer (01/21/2024)   Humiliation, Afraid, Rape, and Kick questionnaire    Fear of Current or Ex-Partner: Patient unable to answer    Emotionally Abused: Patient unable to answer    Physically Abused: Patient unable to answer    Sexually Abused: Patient unable to answer    FAMILY HISTORY: Family History  Problem Relation Age of Onset   CVA Mother    CAD Mother    Diabetes Mother    COPD Mother    Fibromyalgia Mother    Depression Mother    Hypertension Mother    Emphysema Mother        smoked   Heart attack Mother    Prostate cancer Father    Cancer - Prostate Father    Rheum arthritis Sister    Skin cancer Brother    Kidney Stones Brother    Hernia Brother    Depression Daughter    Healthy Son    Healthy Son    Colon polyps Neg Hx    Colon cancer Neg Hx    Esophageal cancer Neg Hx    Stomach cancer Neg Hx    Rectal cancer Neg Hx     ALLERGIES:  is allergic to dust mite extract.  MEDICATIONS:  Current Outpatient Medications  Medication Sig Dispense Refill   albuterol (VENTOLIN HFA) 108 (90 Base) MCG/ACT inhaler Take 1-2 puffs every 4-6 hrs prn shortness of breath. Needs ventolin.     budeson-glycopyrrolate-formoterol (BREZTRI  AEROSPHERE) 160-9-4.8 MCG/ACT AERO Inhale 2 puffs into the lungs in the morning and at bedtime.     budesonide-glycopyrrolate-formoterol (BREZTRI   AEROSPHERE) 160-9-4.8 MCG/ACT AERO inhaler 2 samples provided     budesonide-glycopyrrolate-formoterol (BREZTRI  AEROSPHERE) 160-9-4.8 MCG/ACT AERO inhaler Inhale 2 puffs into the lungs in the morning and at bedtime. 3 each 3   celecoxib  (CELEBREX ) 50 MG capsule Take 1 capsule (50 mg total) by mouth 2 (two) times daily. 60 capsule 1   Cholecalciferol (VITAMIN D3) 125 MCG (5000 UT) CAPS Take 5,000 Units by mouth daily.     ciclopirox  (PENLAC ) 8 % solution Apply topically at bedtime. Apply over nail and surrounding skin. Apply daily over previous coat. After seven (7) days, may remove with alcohol and continue cycle. 6.6 mL 2  clindamycin (CLEOCIN T) 1 % external solution as needed.      clobetasol (OLUX) 0.05 % topical foam Apply topically as needed.     clonazePAM  (KLONOPIN ) 0.5 MG tablet TAKE 1 TABLET BY MOUTH 2 TIMES DAILY AS NEEDED FOR ANXIETY. 60 tablet 2   colesevelam  (WELCHOL ) 625 MG tablet TAKE 3 TABLETS BY MOUTH TWICE A DAY WITH MEAL (Patient taking differently: 4 tablets twice daily.) 180 tablet 3   dicyclomine  (BENTYL ) 10 MG capsule TAKE 1 CAPSULE (10 MG TOTAL) BY MOUTH 4 TIMES A DAY BEFORE MEALS AND AT BEDTIME 360 capsule 0   DULoxetine  (CYMBALTA ) 30 MG capsule TAKE 3 CAPSULES (90 MG TOTAL) BY MOUTH DAILY. FOR TOTAL OF 90 MG DAILY 270 capsule 0   famotidine  (PEPCID ) 40 MG tablet Take 1 tablet (40 mg total) by mouth 2 (two) times daily. 180 tablet 1   fluocinonide -emollient (LIDEX -E) 0.05 % cream Apply 1 application topically 2 (two) times daily. (Patient taking differently: Apply 1 application  topically as needed.) 60 g 3   fluticasone  (FLONASE ) 50 MCG/ACT nasal spray Place 1 spray into both nostrils daily. (Patient taking differently: Place 1 spray into both nostrils as needed.) 16 g 11   folic acid (FOLVITE) 1 MG tablet Take by mouth.     hydroxychloroquine  (PLAQUENIL ) 200 MG tablet Take 1 tablet (200 mg total) by mouth daily.     mupirocin  ointment (BACTROBAN ) 2 % Apply 1 Application  topically 2 (two) times daily. 30 g 2   ondansetron  (ZOFRAN -ODT) 4 MG disintegrating tablet Take 1 tablet (4 mg total) by mouth every 8 (eight) hours as needed for nausea or vomiting. 20 tablet 5   pantoprazole  (PROTONIX ) 40 MG tablet Take 1 tablet (40 mg total) by mouth daily. 90 tablet 1   pregabalin  (LYRICA ) 25 MG capsule Take 1 capsule (25 mg total) by mouth 3 (three) times daily. 270 capsule 1   sulfaSALAzine  (AZULFIDINE ) 500 MG tablet TAKE 2 TABLETS BY MOUTH TWICE A DAY 120 tablet 2   Zoledronic  Acid (RECLAST  IV) Inject into the vein. Infusion: 12/03/2023     No current facility-administered medications for this visit.    REVIEW OF SYSTEMS:   Constitutional: ( - ) fevers, ( - )  chills , ( - ) night sweats Eyes: ( - ) blurriness of vision, ( - ) double vision, ( - ) watery eyes Ears, nose, mouth, throat, and face: ( - ) mucositis, ( - ) sore throat Respiratory: ( - ) cough, ( - ) dyspnea, ( - ) wheezes Cardiovascular: ( - ) palpitation, ( - ) chest discomfort, ( - ) lower extremity swelling Gastrointestinal:  ( - ) nausea, ( - ) heartburn, ( - ) change in bowel habits Skin: ( - ) abnormal skin rashes Lymphatics: ( - ) new lymphadenopathy, ( - ) easy bruising Neurological: ( - ) numbness, ( - ) tingling, ( - ) new weaknesses Behavioral/Psych: ( - ) mood change, ( - ) new changes  All other systems were reviewed with the patient and are negative.  PHYSICAL EXAMINATION: ECOG PERFORMANCE STATUS: 1 - Symptomatic but completely ambulatory  Vitals:   06/15/24 1255  BP: 124/69  Pulse: (!) 102  Resp: 16  Temp: (!) 97.2 F (36.2 C)  SpO2: 97%      Filed Weights   06/15/24 1255  Weight: 74 lb (33.6 kg)     GENERAL: well appearing elderly Caucasian female in NAD  SKIN: skin color, texture, turgor are normal, no  rashes or significant lesions EYES: conjunctiva are pink and non-injected, sclera clear LUNGS: clear to auscultation and percussion with normal breathing effort HEART:  regular rate & rhythm and no murmurs and no lower extremity edema Musculoskeletal: no cyanosis of digits and no clubbing. Ulnar deviation of fingers bilaterally   PSYCH: alert & oriented x 3, fluent speech NEURO: no focal motor/sensory deficits  LABORATORY DATA:  I have reviewed the data as listed    Latest Ref Rng & Units 06/06/2024    2:11 PM 03/22/2024    6:51 PM 12/09/2023    1:23 PM  CBC  WBC 4.0 - 10.5 K/uL 9.9  12.8  12.1   Hemoglobin 12.0 - 15.0 g/dL 16.1  09.6  04.5   Hematocrit 36.0 - 46.0 % 44.5  47.9  45.9   Platelets 150 - 400 K/uL 247  248  285        Latest Ref Rng & Units 06/06/2024    2:11 PM 03/23/2024   12:59 AM 03/22/2024    6:51 PM  CMP  Glucose 70 - 99 mg/dL 76  409  89   BUN 8 - 23 mg/dL 22  20  27    Creatinine 0.44 - 1.00 mg/dL 8.11  9.14  7.82   Sodium 135 - 145 mmol/L 138  137  136   Potassium 3.5 - 5.1 mmol/L 4.2  3.6  4.4   Chloride 98 - 111 mmol/L 103  106  102   CO2 22 - 32 mmol/L 29  22  16    Calcium 8.9 - 10.3 mg/dL 9.4  7.5  8.9   Total Protein 6.5 - 8.1 g/dL 7.6   8.2   Total Bilirubin 0.0 - 1.2 mg/dL 0.3   1.1   Alkaline Phos 38 - 126 U/L 109   80   AST 15 - 41 U/L 28   31   ALT 0 - 44 U/L 21   22     Lab Results  Component Value Date   MPROTEIN 0.8 (H) 06/06/2024   MPROTEIN 1.0 (H) 12/09/2023   MPROTEIN 0.9 (H) 06/05/2023   Lab Results  Component Value Date   KPAFRELGTCHN 50.6 (H) 06/06/2024   KPAFRELGTCHN 55.1 (H) 12/09/2023   KPAFRELGTCHN 63.4 (H) 06/05/2023   LAMBDASER 24.3 06/06/2024   LAMBDASER 22.7 12/09/2023   LAMBDASER 24.9 06/05/2023   KAPLAMBRATIO 2.08 (H) 06/06/2024   KAPLAMBRATIO 2.43 (H) 12/09/2023   KAPLAMBRATIO 2.55 (H) 06/05/2023    RADIOGRAPHIC STUDIES: DG Bone Density Result Date: 05/20/2024 Table formatting from the original result was not included. Date of study: 05/18/2024 Exam: DUAL X-RAY ABSORPTIOMETRY (DXA) FOR BONE MINERAL DENSITY (BMD) Instrument: [employer] Requesting Provider: PCP Indication:  screening for osteoporosis Comparison:  none (please note that it is not possible to compare data from different instruments) Clinical data: Pt is a 73 y.o. female with history of fracture. Results:  Lumbar spine L1-L4 Femoral neck (FN) T-score   -1.3 RFN: -3.5 LFN: -3.1 Assessment: Patient has OSTEOPOROSIS according to the [facility] classification for osteoporosis (see below). Fracture risk: high Comments: the technical quality of the study is good. In patients older than 75, the high prevalence of degenerative changes in the lumbar spine results in artificially higher bone density readings. WHO criteria for diagnosis of osteoporosis in postmenopausal women and in men 53 y/o or older: - normal: T-score -1.0 to + 1.0 - osteopenia/low bone density: T-score between -2.5 and -1.0 - osteoporosis: T-score below -2.5 - severe osteoporosis: T-score below -  2.5 with history of fragility fracture Note: although not part of the WHO classification, the presence of a fragility fracture, regardless of the T-score, should be considered diagnostic of osteoporosis, provided other causes for the fracture have been excluded. Treatment: The National Osteoporosis Foundation recommends that treatment be considered in postmenopausal women and men age 39 or older with: 1. Hip or vertebral (clinical or morphometric) fracture 2. T-score of - 2.5 or lower at the spine or hip 3. 10-year fracture probability by FRAX of at least 20% for a major osteoporotic fracture and 3% for a hip fracture Followup: Repeat BMD in 2  years or as clinically indicated Interpreted by : Natale Bail, MD [facility]   DG Foot Complete Left Result Date: 05/20/2024 Please see detailed radiograph report in office note.   ASSESSMENT & PLAN Abigail Lamb is a 73 y.o. female with medical history significant for IgM kappa monoclonal gammopathy who presents for a follow up visit.     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 No CRAB criteria met. Bone marrow biopsy on 10/24/2020 revealed normocellular marrow with polytypic plasmacytosis (5%).  PLAN: --Labs from 06/06/2024 showed no cytopenias with WBC 9.9, Hgb 14.7, Plt 247. Creatinine and calcium levels are normal. SPEP showed stable M protein measuring 0.8 g/dL. Kappa light chain stable at 50.6 with ratio of 2.08.  --Most recent bone met survey from 12/25/2023 showed questionable small lytic lesions again noted in proximal right humeral shaft that is unchanged from prior study. Repeat bone met survey due for December 2025.  --Due for 24 hour UPEP now.  --No clinical signs or symptoms of transformation to active myeloma --RTC q 6 months with labs 1 week before   Orders Placed This Encounter  Procedures   DG Bone Survey Met    Standing Status:   Future    Expected Date:   06/22/2024    Expiration Date:   06/15/2025    Reason for Exam (SYMPTOM  OR DIAGNOSIS REQUIRED):   evaluate for lytic lesions    Preferred imaging location?:   [facility]   24-Hr Ur UPEP/UIFE/Light Chains/TP    Standing Status:   Future    Expiration Date:   06/15/2025   All questions were answered. The patient knows to call the clinic with any problems, questions or concerns.   I have spent a total of 25 minutes minutes of face-to-face and non-face-to-face time, preparing to see the patient, performing a medically appropriate examination, counseling and educating the patient, ordering medications/tests/procedures, documenting clinical information in the electronic health record, independently interpreting results and communicating results to the patient, and care coordination.   Wyline Hearing PA-C Dept of Hematology and Oncology [facility] Phone: [phone]   06/15/2024 4:11 PM    Literature Support:  1) Jenine Mix RA, Durie BG, Rajkumar SV, et al. Monoclonal gammopathy of  undetermined significance (MGUS) and smoldering (asymptomatic) multiple myeloma: IMWG consensus perspectives risk factors for progression and guidelines for monitoring and management. Leukemia. 2010;24(6):1121-1127. doi:10.1038/leu.2010.60   --If a patient with apparent MGUS has a serum monoclonal protein >15 g/l, IgA or IgM protein type, or an abnormal FLC ratio, a BM aspirate and biopsy should be carried out at baseline to rule out underlying PC malignancy.   2) Jenine Mix RA, Therneau TM, Rajkumar SV, et al. Prevalence of monoclonal gammopathy of undetermined significance. N Engl J Med 2006;354:1362-1369   --MGUS was found  in 3.2 percent of persons 72 years of age or older and 5.3 percent of persons 34 years of age or older.

## 2024-06-16 ENCOUNTER — Ambulatory Visit (HOSPITAL_COMMUNITY)

## 2024-06-17 ENCOUNTER — Other Ambulatory Visit: Payer: Self-pay | Admitting: *Deleted

## 2024-06-17 MED ORDER — HYDROXYCHLOROQUINE SULFATE 200 MG PO TABS: 200.0000 mg | ORAL_TABLET | Freq: Every day | ORAL | 0 refills | Status: DC

## 2024-06-17 NOTE — Telephone Encounter (Signed)
 Patient contacted the office and requested a refill on PLQ.  Last Fill: 02/23/2024  Eye exam: 01/26/2024    Labs: 06/06/2024 WNL  Next Visit: 07/21/2024  Last Visit: 02/23/2024  DX: Rheumatoid arthritis with rheumatoid factor of multiple sites without organ or systems involvement   Current Dose per lab note 02/25/2024: Per Dr. Alvira Josephs, patient should decrease her PLQ to 1 tablet po daily due to her weight  Okay to refill Plaquenil ?

## 2024-06-27 ENCOUNTER — Ambulatory Visit (INDEPENDENT_AMBULATORY_CARE_PROVIDER_SITE_OTHER)

## 2024-06-27 ENCOUNTER — Ambulatory Visit: Admitting: Podiatry

## 2024-06-27 DIAGNOSIS — L97522 Non-pressure chronic ulcer of other part of left foot with fat layer exposed: Secondary | ICD-10-CM

## 2024-06-27 NOTE — Patient Instructions (Signed)
Monitor for any clinical signs or symptoms of infection and directed to call the office immediately should any occur or go to the ER.  

## 2024-06-29 NOTE — Progress Notes (Signed)
 Subjective:   Patient ID: Abigail Lamb, female   DOB: 73 y.o.   MRN: [id number]   HPI Chief Complaint  Patient presents with   Foot Ulcer    RM#13 Left toe ulcer follow up great toe and third toe.    73 year old female presents the office today for follow-up evaluation of wound to her left foot.  She states the wound is doing better.  She has new concerns that she did fall injuring her left foot.  The pain has not subsided.  No other injuries at this time.      Objective:  Physical Exam  General: AAO x3, NAD  Dermatological: On the dorsal aspect the left foot third, first IPJ's are there is a superficial area of skin breakdown.  No drainage or pus noted today.  Localized erythema but there is a blanchable and there is no increase in temperature.  No fluctuation or crepitation but there is no.  The wounds otherwise have healed.  No other open lesions identified.       Vascular: Dorsalis Pedis artery and Posterior Tibial artery pedal pulses are 1/4 bilateral with immedate capillary fill time. There is no pain with calf compression, swelling, warmth, erythema.   Neruologic: Sensation decreased with Semmes Weinstein monofilament.  Musculoskeletal: Digital contractures present.  Unable to appreciate any area pinpoint tenderness.      Assessment:   Ulceration left foot, rheumatoid arthritis     Plan:  Left foot ulcerations - I did sharply debride the lesion without any complications or bleeding today.  Continue antibiotic ointment dressing changes daily, offloading.  Overall he seem to be doing much better but she is still at high risk of infection and needs to monitor closely for any signs or symptoms of infection or further skin breakdown.  Left foot injury -Symptoms have resolved.  X-rays were obtained and reviewed of the left foot.  Multiple views obtained.  Arthritic changes present with chronic erosions of the fifth MPJ.  There is no evidence of acute fracture noted  otherwise.  Return in about 4 weeks (around 07/25/2024) for ulcer.  Donnice JONELLE Fees DPM

## 2024-07-07 ENCOUNTER — Encounter: Payer: Self-pay | Admitting: Internal Medicine

## 2024-07-07 ENCOUNTER — Ambulatory Visit: Admitting: Internal Medicine

## 2024-07-07 ENCOUNTER — Other Ambulatory Visit: Payer: Self-pay | Admitting: Internal Medicine

## 2024-07-07 VITALS — BP 118/60 | HR 91 | Temp 97.3°F | Resp 19 | Ht 61.5 in | Wt 70.6 lb

## 2024-07-07 DIAGNOSIS — K648 Other hemorrhoids: Secondary | ICD-10-CM | POA: Diagnosis not present

## 2024-07-07 DIAGNOSIS — D122 Benign neoplasm of ascending colon: Secondary | ICD-10-CM

## 2024-07-07 DIAGNOSIS — K621 Rectal polyp: Secondary | ICD-10-CM | POA: Diagnosis not present

## 2024-07-07 DIAGNOSIS — Z860100 Personal history of colon polyps, unspecified: Secondary | ICD-10-CM

## 2024-07-07 DIAGNOSIS — K635 Polyp of colon: Secondary | ICD-10-CM

## 2024-07-07 DIAGNOSIS — Z1211 Encounter for screening for malignant neoplasm of colon: Secondary | ICD-10-CM

## 2024-07-07 DIAGNOSIS — E785 Hyperlipidemia, unspecified: Secondary | ICD-10-CM | POA: Diagnosis not present

## 2024-07-07 HISTORY — PX: COLONOSCOPY: SHX5424

## 2024-07-07 MED ORDER — SODIUM CHLORIDE 0.9 % IV SOLN: 500.0000 mL | Freq: Once | INTRAVENOUS | Status: DC

## 2024-07-07 NOTE — Progress Notes (Unsigned)
 Office Visit Note  Patient: Abigail Lamb             Date of Birth: Feb 29, 1951           MRN: [id number]             PCP: Joshua Debby CROME, MD Referring: Joshua Debby CROME, MD Visit Date: 07/21/2024 Occupation: @GUAROCC @  Subjective:  Medication monitoring   History of Present Illness: Abigail Lamb is a 73 y.o. female with history of seropositive rheumatoid arthritis and osteoarthritis.  Patient remains on Plaquenil  200 mg 1 tablet daily and sulfasalazine  500 mg 2 tablets BID.  She is tolerating combination therapy without any side effects.  Patient continues to experience intermittent flares especially in her hands and wrist joints.  Patient states that she has been taking Celebrex  50 mg twice daily for pain relief.  Patient states that her PCP Dr. Joshua has sent in Lyrica  but she has not yet started.  Patient states that she has been under the care of Dr. Gershon due to 2 ulcers on her toes of the left foot.  She is currently wearing a postop shoe but has had difficulty having these ulcers heal.  Patient states that Dr. Joshua has prescribed Bactrim  which she plans on initiating once she picks up the prescription from the pharmacy.  Patient states that she has not been holding sulfasalazine  while trying to get these ulcers to heal. Patient received the first IV Reclast  infusion on 12/03/2023.  She tolerated Reclast  without any side effects.  She had an updated bone density on 05/18/2024.    Activities of Daily Living:  Patient reports morning stiffness for 2-3 hours.   Patient Reports nocturnal pain.  Difficulty dressing/grooming: Reports Difficulty climbing stairs: Reports Difficulty getting out of chair: Reports Difficulty using hands for taps, buttons, cutlery, and/or writing: Reports  Review of Systems  Constitutional:  Positive for fatigue.  HENT:  Positive for mouth dryness. Negative for mouth sores.   Eyes:  Positive for dryness.  Respiratory:  Positive for shortness of breath.    Cardiovascular:  Positive for palpitations. Negative for chest pain.  Gastrointestinal:  Negative for blood in stool, constipation and diarrhea.  Endocrine: Negative for increased urination.  Genitourinary:  Positive for involuntary urination.  Musculoskeletal:  Positive for joint pain, gait problem, joint pain, joint swelling, myalgias, muscle weakness, morning stiffness, muscle tenderness and myalgias.  Skin:  Positive for rash, hair loss and sensitivity to sunlight. Negative for color change.  Allergic/Immunologic: Positive for susceptible to infections.  Neurological:  Positive for dizziness. Negative for headaches.  Hematological:  Negative for swollen glands.  Psychiatric/Behavioral:  Positive for depressed mood. Negative for sleep disturbance. The patient is nervous/anxious.     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) 11/08/2021   Gastroparesis 11/07/2021   Irritable bowel syndrome with both constipation and diarrhea 11/07/2021   Encounter for general adult medical examination with abnormal findings 11/07/2021   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   Visit for screening mammogram 08/02/2020   Rheumatoid arthritis involving multiple sites with positive rheumatoid factor (HCC) 08/02/2020   Hyperlipidemia LDL goal <130 08/02/2020   Tobacco abuse 08/02/2020  Dupuytren's contracture of right hand 08/02/2020   Pulmonary emphysema (HCC)    GOLD C COPD by GOLD classification (HCC) 01/30/2020   Neurodermatitis 03/28/2019   Primary insomnia 03/28/2019   Recurrent depression (HCC) 03/28/2019    Past  Medical History:  Diagnosis Date   Arthritis    Celiac disease    Encephalopathy    As teenager   Gastroparesis    GERD (gastroesophageal reflux disease)    Hyperlipidemia    Hypertension    IBS (irritable bowel syndrome)    MGUS (monoclonal gammopathy of unknown significance)    per patient    MGUS (monoclonal gammopathy of unknown significance)    Osteoporosis    per patient, diagnosed by Dr. [facility] in [facility]   PUD (peptic ulcer disease)    Pulmonary emphysema (HCC)    Recurrent depression (HCC)    Smoker     Family History  Problem Relation Age of Onset   CVA Mother    CAD Mother    Diabetes Mother    COPD Mother    Fibromyalgia Mother    Depression Mother    Hypertension Mother    Emphysema Mother        smoked   Heart attack Mother    Prostate cancer Father    Cancer - Prostate Father    Rheum arthritis Sister    Skin cancer Brother    Kidney Stones Brother    Hernia Brother    Depression Daughter    Healthy Son    Healthy Son    Colon polyps Neg Hx    Colon cancer Neg Hx    Esophageal cancer Neg Hx    Stomach cancer Neg Hx    Rectal cancer Neg Hx    Past Surgical History:  Procedure Laterality Date   ABCESS DRAINAGE Left    under left arm   COLONOSCOPY     COLONOSCOPY  07/07/2024   spinal tap     age 17   TONSILLECTOMY  38   Social History   Social History Narrative   Son and his girlfriend Lives with her-2025 and a dog   Immunization History  Administered Date(s) Administered   Fluad Quad(high Dose 65+) 11/08/2021, 10/27/2022   Fluad Trivalent(High Dose 65+) 11/17/2023   Influenza, High Dose Seasonal PF 12/30/2019   Influenza-Unspecified 01/04/2018, 12/15/2018, 09/11/2020   PNEUMOCOCCAL CONJUGATE-20 11/08/2021   Pneumococcal Polysaccharide-23 08/02/2020   Tdap 11/05/2020   Zoster Recombinant(Shingrix ) 03/20/2021, 11/27/2021     Objective: Vital Signs: BP 95/61 (BP Location: Left Arm, Patient Position: Sitting, Cuff Size: Normal)    Pulse 85   Resp 16   Ht 5' 1.5 (1.562 m)   Wt 73 lb 9.6 oz (33.4 kg)   BMI 13.68 kg/m    Physical Exam Vitals and nursing note reviewed.  Constitutional:      Appearance: She is well-developed.  HENT:     Head: Normocephalic and atraumatic.  Eyes:     Conjunctiva/sclera: Conjunctivae normal.  Cardiovascular:     Rate and Rhythm: Normal rate and regular rhythm.     Heart sounds: Normal heart sounds.  Pulmonary:     Effort: Pulmonary effort is normal.     Breath sounds: Normal breath sounds.  Abdominal:     General: Bowel sounds are normal.     Palpations: Abdomen is soft.  Musculoskeletal:     Cervical back: Normal range of motion.  Lymphadenopathy:     Cervical: No cervical adenopathy.  Skin:  General: Skin is warm and dry.     Capillary Refill: Capillary refill takes less than 2 seconds.  Neurological:     Mental Status: She is alert and oriented to person, place, and time.  Psychiatric:        Behavior: Behavior normal.      Musculoskeletal Exam: C-spine has limited range of motion with lateral rotation.  Shoulder joints have good range of motion.  No tenderness or swelling along the elbow joint line.  Rheumatoid nodules noted on the extensor surface of both elbows.  Limited range of motion of both wrist joints with tenosynovitis bilaterally.  Synovial thickening of MCP joints with subluxation and ulnar deviation.  Unable to extend MCP joints.  Hip joints have slightly limited range of motion.  Knee joints have good range of motion with no warmth or effusion.  Ankle joints have good range of motion with no tenderness or joint swelling.  CDAI Exam: CDAI Score: -- Patient Global: --; Provider Global: -- Swollen: --; Tender: -- Joint Exam 07/21/2024   No joint exam has been documented for this visit   There is currently no information documented on the homunculus. Go to the Rheumatology activity and complete the homunculus joint exam.  Investigation: No  additional findings.  Imaging: DG Foot Complete Left Result Date: 06/27/2024 Please see detailed radiograph report in office note.   Recent Labs: Lab Results  Component Value Date   WBC 9.9 06/06/2024   HGB 14.7 06/06/2024   PLT 247 06/06/2024   NA 138 06/06/2024   K 4.2 06/06/2024   CL 103 06/06/2024   CO2 29 06/06/2024   GLUCOSE 76 06/06/2024   BUN 22 06/06/2024   CREATININE 0.58 06/06/2024   BILITOT 0.3 06/06/2024   ALKPHOS 109 06/06/2024   AST 28 06/06/2024   ALT 21 06/06/2024   PROT 7.6 06/06/2024   ALBUMIN 3.9 06/06/2024   CALCIUM 9.4 06/06/2024   GFRAA 107 10/26/2020   QFTBGOLDPLUS NEGATIVE 09/17/2020    Speciality Comments: PLQ Eye Exam: 01/26/2024 [facility] Follow up: 12/27/2024  Humira and Orencia were discontinued in the past due to frequent infections per patient.  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  She has been taking Plaquenil  200 mg 1 tablet daily and sulfasalazine  500 mg 2 tablets twice daily.  She is tolerating combination therapy without any side effects.  The dose of sulfasalazine  was reduced after her last office visit due to her weight.  She has been having to take Celebrex  50 mg twice daily to help alleviate her inflammation. Patient has been under the care of Dr. Gershon due to nonhealing ulcers on the left hallux and third toe.  Reviewed Dr. Jameson office visit note from 06/27/2024--there was mild localized erythema but no open lesions were identified.  She has continued to wear the postop shoe.  She was evaluated by Dr. Joshua yesterday  and was prescribed a prescription of Bactrim  which she has not yet initiated.  Discussed the importance of holding sulfasalazine  while taking Bactrim  and she will continue to hold sulfasalazine  until the wound has completely healed.  She will be following up  with Dr. Gershon next week.  Discussed the importance of tobacco cessation for better management of rheumatoid arthritis as well as for better healing for the ulcers on her left foot. She will notify us  if she develops any new or worsening symptoms.  She will follow-up in the office in 5 months or sooner if needed. 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   High risk medication use - Plaquenil  200 mg 1 tablet daily and sulfasalazine  500 mg 2 tablets by mouth twice daily.  Advised to hold sulfasalazine  while taking bactrim  and until the ulcers have healed.  She voiced understanding.  Reduced dose of plaquenil  based on her weight.  No longer taking prednisone . CBC and CMP updated on 06/06/24. Orders for CBC and CMP released today.   PLQ Eye Exam: 01/26/2024 [facility] Follow up: 12/27/2024   - Plan: CBC with Differential/Platelet, Comprehensive metabolic panel with GFR, Iron, TIBC and Ferritin Panel  Primary osteoarthritis of both knees: She has good ROM of both knee joints. No warmth or effusion noted.   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 Federico.Bone marrow biopsy on 10/24/2020 revealed normocellular marrow with polytypic plasmacytosis (5%). Under care of hem/onc.  - Plan: Iron, TIBC and Ferritin Panel  Age-related osteoporosis without current pathological fracture - Forteo  daily injections started 05/29/22. D/c forteo  due to daily SE-nausea and dizziness.  First IV reclast  administered on 12/03/23. Previous DEXA 02/07/2022: LFN BMD 0.440 with T score -3.7. DEXA updated on 05/18/2024: Lumbar spine T-score -1.3.  Right femoral neck T-score -3.5.  Left  femoral neck T-score -3.1. She had a recent fall but no fracture.  Plan to check vitamin D  today. Plan to proceed with next IV Reclast  infusion in December 2025. Due to update DEXA in May 2027.  Vitamin D  deficiency -Plan to check vitamin D  today plan: VITAMIN D  25 Hydroxy (Vit-D Deficiency, Fractures)  Other fatigue -Chronic fatigue --plan to obtain the following labs today for further evaluation.  Plan: VITAMIN D  25 Hydroxy (Vit-D Deficiency, Fractures), Iron, TIBC and Ferritin Panel, Magnesium  Muscle cramping - Plan to check magnesium today. Plan: Magnesium  History of anemia: Plan to obtain the following lab work today.   Other medical conditions are listed as follows:   Prediabetes  Hyperlipidemia LDL goal <130  Stage 2 moderate COPD by GOLD classification (HCC)  Panlobular emphysema (HCC)  Lung nodules  Tobacco abuse: The patient was counseled on the dangers of tobacco use, and was advised to quit.  Reviewed strategies to maximize success, including stress management and support of family/friends.  Patient is currently working with both her PCP and pulmonologist in order to quit smoking.  Kidney stone  Recurrent depression (HCC)  Primary insomnia    Orders: Orders Placed This Encounter  Procedures   CBC with Differential/Platelet   Comprehensive metabolic panel with GFR   VITAMIN D  25 Hydroxy (Vit-D Deficiency, Fractures)   Iron, TIBC and Ferritin Panel   Magnesium   No orders of the defined types were placed in this encounter.   Follow-Up Instructions: Return in about 4 months (around 11/21/2024) for Rheumatoid arthritis, Osteoarthritis.   Waddell CHRISTELLA Craze, PA-C  Note - This record has been created using Dragon software.  Chart creation errors have been sought, but may not always  have been located. Such creation errors do not reflect on  the standard of medical care.

## 2024-07-07 NOTE — Patient Instructions (Addendum)
 Resume previous diet Continue present medications Await pathology results Office visit 09/09/24 at 910 AM with Dr Federico on the 2nd floor  Handouts/information given for polyps and hemorrhoids  YOU HAD AN ENDOSCOPIC PROCEDURE TODAY AT THE [facility]:   Refer to the procedure report that was given to you for any specific questions about what was found during the examination.  If the procedure report does not answer your questions, please call your gastroenterologist to clarify.  If you requested that your care partner not be given the details of your procedure findings, then the procedure report has been included in a sealed envelope for you to review at your convenience later.  YOU SHOULD EXPECT: Some feelings of bloating in the abdomen. Passage of more gas than usual.  Walking can help get rid of the air that was put into your GI tract during the procedure and reduce the bloating. If you had a lower endoscopy (such as a colonoscopy or flexible sigmoidoscopy) you may notice spotting of blood in your stool or on the toilet paper. If you underwent a bowel prep for your procedure, you may not have a normal bowel movement for a few days.  Please Note:  You might notice some irritation and congestion in your nose or some drainage.  This is from the oxygen used during your procedure.  There is no need for concern and it should clear up in a day or so.  SYMPTOMS TO REPORT IMMEDIATELY:  Following lower endoscopy (colonoscopy):  Excessive amounts of blood in the stool  Significant tenderness or worsening of abdominal pains  Swelling of the abdomen that is new, acute  Fever of 100F or higher For urgent or emergent issues, a gastroenterologist can be reached at any hour by calling [phone]. Do not use MyChart messaging for urgent concerns.   DIET:  We do recommend a small meal at first, but then you may proceed to your regular diet.  Drink plenty of fluids but you should avoid  alcoholic beverages for 24 hours.  ACTIVITY:  You should plan to take it easy for the rest of today and you should NOT DRIVE or use heavy machinery until tomorrow (because of the sedation medicines used during the test).    FOLLOW UP: Our staff will call the number listed on your records the next business day following your procedure.  We will call around 7:15- 8:00 am to check on you and address any questions or concerns that you may have regarding the information given to you following your procedure. If we do not reach you, we will leave a message.     If any biopsies were taken you will be contacted by phone or by letter within the next 1-3 weeks.  Please call us  at [phone] if you have not heard about the biopsies in 3 weeks.    SIGNATURES/CONFIDENTIALITY: You and/or your care partner have signed paperwork which will be entered into your electronic medical record.  These signatures attest to the fact that that the information above on your After Visit Summary has been reviewed and is understood.  Full responsibility of the confidentiality of this discharge information lies with you and/or your care-partner.

## 2024-07-07 NOTE — Progress Notes (Signed)
 Called to room to assist during endoscopic procedure.  Patient ID and intended procedure confirmed with present staff. Received instructions for my participation in the procedure from the performing physician.

## 2024-07-07 NOTE — Progress Notes (Signed)
 GASTROENTEROLOGY PROCEDURE H&P NOTE   Primary Care Physician: Joshua Debby CROME, MD    Reason for Procedure:   History of colon polyps  Plan:    Colonoscopy  Patient is appropriate for endoscopic procedure(s) in the ambulatory ([facility]) setting.  The nature of the procedure, as well as the risks, benefits, and alternatives were carefully and thoroughly reviewed with the patient. Ample time for discussion and questions allowed. The patient understood, was satisfied, and agreed to proceed.     HPI: Abigail Lamb is a 73 y.o. female who presents for colonoscopy for history of colon polyps. Denies blood in stools, changes in bowel habits, or unintentional weight loss. Denies family history of colon cancer.  Colonoscopy 05/04/17: Sigmoid colon diverticula. Localized erythema at IC valve that was biopsied. 4-6 mm polyp in the ascending colon removed with forceps. 2-3 mm sigmoid polyp removed. Path: Focal active enteritis at the IC valve that is favored to be mucosal trauma/prolapse. Random colon biopsies that were normal. One tubular adenoma in the ascending colon. Sigmoid colon polyp was benign colonic mucosa.  Past Medical History:  Diagnosis Date   Arthritis    Celiac disease    Encephalopathy    As teenager   Gastroparesis    GERD (gastroesophageal reflux disease)    Hyperlipidemia    Hypertension    IBS (irritable bowel syndrome)    MGUS (monoclonal gammopathy of unknown significance)    per patient    MGUS (monoclonal gammopathy of unknown significance)    Osteoporosis    per patient, diagnosed by Dr. [facility] in [facility]   PUD (peptic ulcer disease)    Pulmonary emphysema (HCC)    Recurrent depression (HCC)    Smoker     Past Surgical History:  Procedure Laterality Date   ABCESS DRAINAGE Left    under left arm   COLONOSCOPY     spinal tap     age 2   TONSILLECTOMY  1977    Prior to Admission medications   Medication Sig Start Date End Date Taking? Authorizing  Provider  albuterol (VENTOLIN HFA) 108 (90 Base) MCG/ACT inhaler Take 1-2 puffs every 4-6 hrs prn shortness of breath. Needs ventolin. 09/07/18   [provider]  budeson-glycopyrrolate-formoterol (BREZTRI  AEROSPHERE) 160-9-4.8 MCG/ACT AERO Inhale 2 puffs into the lungs in the morning and at bedtime. 03/14/24   Shelah Lamar RAMAN, MD  budesonide-glycopyrrolate-formoterol (BREZTRI  AEROSPHERE) 160-9-4.8 MCG/ACT AERO inhaler 2 samples provided 05/31/24   Byrum, Robert S, MD  budesonide-glycopyrrolate-formoterol (BREZTRI  AEROSPHERE) 160-9-4.8 MCG/ACT AERO inhaler Inhale 2 puffs into the lungs in the morning and at bedtime. 06/08/24   Shelah Lamar RAMAN, MD  celecoxib  (CELEBREX ) 50 MG capsule Take 1 capsule (50 mg total) by mouth 2 (two) times daily. 05/09/24   Joshua Debby CROME, MD  Cholecalciferol (VITAMIN D3) 125 MCG (5000 UT) CAPS Take 5,000 Units by mouth daily.    [provider]  ciclopirox  (PENLAC ) 8 % solution Apply topically at bedtime. Apply over nail and surrounding skin. Apply daily over previous coat. After seven (7) days, may remove with alcohol and continue cycle. 02/29/24   Gershon Donnice SAUNDERS, DPM  clindamycin (CLEOCIN T) 1 % external solution as needed.  01/20/20   [provider]  clobetasol (OLUX) 0.05 % topical foam Apply topically as needed. 01/20/20   [provider]  clonazePAM  (KLONOPIN ) 0.5 MG tablet TAKE 1 TABLET BY MOUTH 2 TIMES DAILY AS NEEDED FOR ANXIETY. 06/01/24   Joshua Debby CROME, MD  colesevelam  (  WELCHOL ) 625 MG tablet TAKE 3 TABLETS BY MOUTH TWICE A DAY WITH MEAL Patient taking differently: 4 tablets twice daily. 12/24/23   Joshua Debby CROME, MD  dicyclomine  (BENTYL ) 10 MG capsule TAKE 1 CAPSULE (10 MG TOTAL) BY MOUTH 4 TIMES A DAY BEFORE MEALS AND AT BEDTIME 02/23/24   Joshua Debby CROME, MD  DULoxetine  (CYMBALTA ) 30 MG capsule TAKE 3 CAPSULES (90 MG TOTAL) BY MOUTH DAILY. FOR TOTAL OF 90 MG DAILY 05/25/24   Joshua Debby CROME, MD  famotidine  (PEPCID ) 40 MG tablet  Take 1 tablet (40 mg total) by mouth 2 (two) times daily. 04/20/24   Joshua Debby CROME, MD  fluocinonide -emollient (LIDEX -E) 0.05 % cream Apply 1 application topically 2 (two) times daily. Patient taking differently: Apply 1 application  topically as needed. 11/05/20   Joshua Debby CROME, MD  fluticasone  (FLONASE ) 50 MCG/ACT nasal spray Place 1 spray into both nostrils daily. Patient taking differently: Place 1 spray into both nostrils as needed. 08/19/22   Gladis Leonor HERO, MD  folic acid (FOLVITE) 1 MG tablet Take by mouth. 02/28/19   [provider]  hydroxychloroquine  (PLAQUENIL ) 200 MG tablet Take 1 tablet (200 mg total) by mouth daily. 06/17/24   Cheryl Waddell HERO, PA-C  mupirocin  ointment (BACTROBAN ) 2 % Apply 1 Application topically 2 (two) times daily. 02/29/24   Gershon Donnice SAUNDERS, DPM  ondansetron  (ZOFRAN -ODT) 4 MG disintegrating tablet Take 1 tablet (4 mg total) by mouth every 8 (eight) hours as needed for nausea or vomiting. 04/20/24   Joshua Debby CROME, MD  pantoprazole  (PROTONIX ) 40 MG tablet Take 1 tablet (40 mg total) by mouth daily. 04/20/24 10/17/24  Joshua Debby CROME, MD  pregabalin  (LYRICA ) 25 MG capsule Take 1 capsule (25 mg total) by mouth 3 (three) times daily. 04/20/24   Joshua Debby CROME, MD  sulfaSALAzine  (AZULFIDINE ) 500 MG tablet TAKE 2 TABLETS BY MOUTH TWICE A DAY 02/26/24   Dolphus Reiter, MD  Zoledronic  Acid (RECLAST  IV) Inject into the vein. Infusion: 12/03/2023    [provider]    Current Outpatient Medications  Medication Sig Dispense Refill   albuterol (VENTOLIN HFA) 108 (90 Base) MCG/ACT inhaler Take 1-2 puffs every 4-6 hrs prn shortness of breath. Needs ventolin.     budeson-glycopyrrolate-formoterol (BREZTRI  AEROSPHERE) 160-9-4.8 MCG/ACT AERO Inhale 2 puffs into the lungs in the morning and at bedtime.     budesonide-glycopyrrolate-formoterol (BREZTRI  AEROSPHERE) 160-9-4.8 MCG/ACT AERO inhaler 2 samples provided     budesonide-glycopyrrolate-formoterol  (BREZTRI  AEROSPHERE) 160-9-4.8 MCG/ACT AERO inhaler Inhale 2 puffs into the lungs in the morning and at bedtime. 3 each 3   celecoxib  (CELEBREX ) 50 MG capsule Take 1 capsule (50 mg total) by mouth 2 (two) times daily. 60 capsule 1   Cholecalciferol (VITAMIN D3) 125 MCG (5000 UT) CAPS Take 5,000 Units by mouth daily.     ciclopirox  (PENLAC ) 8 % solution Apply topically at bedtime. Apply over nail and surrounding skin. Apply daily over previous coat. After seven (7) days, may remove with alcohol and continue cycle. 6.6 mL 2   clindamycin (CLEOCIN T) 1 % external solution as needed.      clobetasol (OLUX) 0.05 % topical foam Apply topically as needed.     clonazePAM  (KLONOPIN ) 0.5 MG tablet TAKE 1 TABLET BY MOUTH 2 TIMES DAILY AS NEEDED FOR ANXIETY. 60 tablet 2   colesevelam  (WELCHOL ) 625 MG tablet TAKE 3 TABLETS BY MOUTH TWICE A DAY WITH MEAL (Patient taking differently: 4 tablets twice daily.) 180 tablet 3  dicyclomine  (BENTYL ) 10 MG capsule TAKE 1 CAPSULE (10 MG TOTAL) BY MOUTH 4 TIMES A DAY BEFORE MEALS AND AT BEDTIME 360 capsule 0   DULoxetine  (CYMBALTA ) 30 MG capsule TAKE 3 CAPSULES (90 MG TOTAL) BY MOUTH DAILY. FOR TOTAL OF 90 MG DAILY 270 capsule 0   famotidine  (PEPCID ) 40 MG tablet Take 1 tablet (40 mg total) by mouth 2 (two) times daily. 180 tablet 1   fluocinonide -emollient (LIDEX -E) 0.05 % cream Apply 1 application topically 2 (two) times daily. (Patient taking differently: Apply 1 application  topically as needed.) 60 g 3   fluticasone  (FLONASE ) 50 MCG/ACT nasal spray Place 1 spray into both nostrils daily. (Patient taking differently: Place 1 spray into both nostrils as needed.) 16 g 11   folic acid (FOLVITE) 1 MG tablet Take by mouth.     hydroxychloroquine  (PLAQUENIL ) 200 MG tablet Take 1 tablet (200 mg total) by mouth daily. 90 tablet 0   mupirocin  ointment (BACTROBAN ) 2 % Apply 1 Application topically 2 (two) times daily. 30 g 2   ondansetron  (ZOFRAN -ODT) 4 MG disintegrating tablet  Take 1 tablet (4 mg total) by mouth every 8 (eight) hours as needed for nausea or vomiting. 20 tablet 5   pantoprazole  (PROTONIX ) 40 MG tablet Take 1 tablet (40 mg total) by mouth daily. 90 tablet 1   pregabalin  (LYRICA ) 25 MG capsule Take 1 capsule (25 mg total) by mouth 3 (three) times daily. 270 capsule 1   sulfaSALAzine  (AZULFIDINE ) 500 MG tablet TAKE 2 TABLETS BY MOUTH TWICE A DAY 120 tablet 2   Zoledronic  Acid (RECLAST  IV) Inject into the vein. Infusion: 12/03/2023     Current Facility-Administered Medications  Medication Dose Route Frequency Provider Last Rate Last Admin   0.9 %  sodium chloride  infusion  500 mL Intravenous Once Federico Rosario BROCKS, MD        Allergies as of 07/07/2024 - Review Complete 07/07/2024  Allergen Reaction Noted   Dust mite extract  09/17/2020    Family History  Problem Relation Age of Onset   CVA Mother    CAD Mother    Diabetes Mother    COPD Mother    Fibromyalgia Mother    Depression Mother    Hypertension Mother    Emphysema Mother        smoked   Heart attack Mother    Prostate cancer Father    Cancer - Prostate Father    Rheum arthritis Sister    Skin cancer Brother    Kidney Stones Brother    Hernia Brother    Depression Daughter    Healthy Son    Healthy Son    Colon polyps Neg Hx    Colon cancer Neg Hx    Esophageal cancer Neg Hx    Stomach cancer Neg Hx    Rectal cancer Neg Hx     Social History   Socioeconomic History   Marital status: Divorced    Spouse name: Not on file   Number of children: 3   Years of education: Not on file   Highest education level: Associate degree: occupational, Scientist, product/process development, or vocational program  Occupational History   Occupation: RETIRED  Tobacco Use   Smoking status: Every Day    Current packs/day: 0.75    Average packs/day: 0.8 packs/day for 49.0 years (36.8 ttl pk-yrs)    Types: Cigarettes    Passive exposure: Current   Smokeless tobacco: Never  Vaping Use   Vaping status: Never Used  Substance and Sexual Activity   Alcohol use: Not Currently   Drug use: Never   Sexual activity: Not Currently    Partners: Male  Other Topics Concern   Not on file  Social History Narrative   Son and his girlfriend Lives with her-2025 and a dog   Social Drivers of Health   Financial Resource Strain: High Risk (01/21/2024)   Overall Financial Resource Strain (CARDIA)    Difficulty of Paying Living Expenses: Hard  Food Insecurity: Food Insecurity Present (01/21/2024)   Hunger Vital Sign    Worried About Running Out of Food in the Last Year: Sometimes true    Ran Out of Food in the Last Year: Sometimes true  Transportation Needs: Unmet Transportation Needs (01/21/2024)   PRAPARE - Transportation    Lack of Transportation (Medical): Yes    Lack of Transportation (Non-Medical): Yes  Physical Activity: Unknown (01/21/2024)   Exercise Vital Sign    Days of Exercise per Week: 0 days    Minutes of Exercise per Session: Not on file  Stress: Stress Concern Present (01/21/2024)   [employer] of Occupational Health - Occupational Stress Questionnaire    Feeling of Stress : Rather much  Social Connections: Socially Isolated (01/21/2024)   Social Connection and Isolation Panel    Frequency of Communication with Friends and Family: More than three times a week    Frequency of Social Gatherings with Friends and Family: More than three times a week    Attends Religious Services: Never    Database administrator or Organizations: No    Attends Banker Meetings: Not on file    Marital Status: Widowed  Intimate Partner Violence: Patient Unable To Answer (01/21/2024)   Humiliation, Afraid, Rape, and Kick questionnaire    Fear of Current or Ex-Partner: Patient unable to answer    Emotionally Abused: Patient unable to answer    Physically Abused: Patient unable to answer    Sexually Abused: Patient unable to answer    Physical Exam: Vital signs in last 24 hours: There were no  vitals taken for this visit. GEN: NAD EYE: Sclerae anicteric ENT: MMM CV: Non-tachycardic Pulm: No increased work of breathing GI: Soft, NT/ND NEURO:  Alert & Oriented   Estefana Kidney, MD [facility]  07/07/2024 10:00 AM

## 2024-07-07 NOTE — Progress Notes (Signed)
 Pt's states no medical or surgical changes since previsit or office visit.

## 2024-07-07 NOTE — Progress Notes (Signed)
 A/o x 3, VSS, gd SR's, pleased with anesthesia, report to RN

## 2024-07-07 NOTE — Op Note (Signed)
  [facility] Patient Name: Abigail Lamb Procedure Date: 07/07/2024 9:52 AM MRN: [id number] Endoscopist: Rosario Estefana Kidney , , [id number] Age: 73 Referring MD:  Date of Birth: May 20, 1951 Gender: Female Account #: [id number] Procedure:                Colonoscopy Indications:              High risk colon cancer surveillance: Personal                            history of colonic polyps Medicines:                Monitored Anesthesia Care Procedure:                Pre-Anesthesia Assessment:                           - Prior to the procedure, a History and Physical                            was performed, and patient medications and                            allergies were reviewed. The patient's tolerance of                            previous anesthesia was also reviewed. The risks                            and benefits of the procedure and the sedation                            options and risks were discussed with the patient.                            All questions were answered, and informed consent                            was obtained. Prior Anticoagulants: The patient has                            taken no anticoagulant or antiplatelet agents. ASA                            Grade Assessment: III - A patient with severe                            systemic disease. After reviewing the risks and                            benefits, the patient was deemed in satisfactory                            condition to undergo the procedure.  After obtaining informed consent, the colonoscope                            was passed under direct vision. Throughout the                            procedure, the patient's blood pressure, pulse, and                            oxygen saturations were monitored continuously. The                            [id number]-HQ190L Colonoscope [id number] was introduced                            through the anus and advanced  to the the terminal                            ileum. The colonoscopy was performed without                            difficulty. The patient tolerated the procedure                            well. The quality of the bowel preparation was                            excellent. The terminal ileum, ileocecal valve,                            appendiceal orifice, and rectum were photographed. Scope In: 10:34:25 AM Scope Out: 11:03:56 AM Scope Withdrawal Time: 0 hours 20 minutes 2 seconds  Total Procedure Duration: 0 hours 29 minutes 31 seconds  Findings:                 The terminal ileum appeared normal.                           Three sessile polyps were found in the ascending                            colon. The polyps were 3 to 14 mm in size. These                            polyps were removed with a cold snare. Resection                            and retrieval were complete.                           Two sessile polyps were found in the rectum and                            sigmoid colon. The polyps were 3 to  4 mm in size.                            These polyps were removed with a cold snare.                            Resection and retrieval were complete.                           Non-bleeding internal hemorrhoids were found during                            retroflexion. Complications:            No immediate complications. Estimated Blood Loss:     Estimated blood loss was minimal. Impression:               - The examined portion of the ileum was normal.                           - Three 3 to 14 mm polyps in the ascending colon,                            removed with a cold snare. Resected and retrieved.                           - Two 3 to 4 mm polyps in the rectum and in the                            sigmoid colon, removed with a cold snare. Resected                            and retrieved.                           - Non-bleeding internal hemorrhoids. Recommendation:            - Discharge patient to home (with escort).                           - Await pathology results.                           - Return to GI clinic in 1-2 months to discuss                            weight loss symptoms.                           - The findings and recommendations were discussed                            with the patient. Dr Estefana Federico Rosario Estefana Federico,  07/07/2024 11:11:37 AM

## 2024-07-08 ENCOUNTER — Telehealth: Payer: Self-pay

## 2024-07-08 NOTE — Telephone Encounter (Signed)
 Left message on answering machine.

## 2024-07-13 ENCOUNTER — Ambulatory Visit: Payer: Self-pay | Admitting: Internal Medicine

## 2024-07-13 ENCOUNTER — Encounter: Payer: Self-pay | Admitting: Nurse Practitioner

## 2024-07-13 ENCOUNTER — Ambulatory Visit: Payer: Medicare Other | Admitting: Nurse Practitioner

## 2024-07-13 VITALS — BP 112/70 | HR 100 | Ht 61.0 in | Wt 73.4 lb

## 2024-07-13 DIAGNOSIS — F17210 Nicotine dependence, cigarettes, uncomplicated: Secondary | ICD-10-CM

## 2024-07-13 DIAGNOSIS — J449 Chronic obstructive pulmonary disease, unspecified: Secondary | ICD-10-CM | POA: Diagnosis not present

## 2024-07-13 DIAGNOSIS — Z720 Tobacco use: Secondary | ICD-10-CM

## 2024-07-13 DIAGNOSIS — R058 Other specified cough: Secondary | ICD-10-CM | POA: Diagnosis not present

## 2024-07-13 LAB — SURGICAL PATHOLOGY

## 2024-07-13 NOTE — Patient Instructions (Addendum)
 Continue Breztri  2 puffs Twice daily. Brush tongue and rinse mouth afterwards Continue Albuterol inhaler 2 puffs every 6 hours as needed for shortness of breath or wheezing. Notify if symptoms persist despite rescue inhaler/neb use.  Continue flonase  nasal spray 2 sprays each nostril daily  Continue claritin 1 tab daily as needed for allergies   Referral back to the lung cancer screening program - [phone] Call them to get your referral set up again.  You'll be due in December for your scan  Keep working on quitting smoking  Follow up in 6 months with Abigail Lamb. If symptoms do not improve or worsen, please contact office for sooner follow up or seek emergency care.

## 2024-07-13 NOTE — Progress Notes (Unsigned)
 @Patient  ID: Abigail Lamb, female    DOB: 08/08/1951, 73 y.o.   MRN: [id number]  Chief Complaint  Patient presents with   Follow-up   COPD    Patient states her breathing has improved     Referring provider: Joshua Debby CROME, MD  HPI: 73 year old female, active smoker followed for COPD and history of lung nodules.  She is a patient of Dr. Lanny and last seen in office 01/14/2024.  Past medical history significant for idiopathic hypertension, IBS, rheumatoid arthritis, depression.  TEST/EVENTS:  07/24/2022 PFT: FVC 78, FEV1 60, ratio 61, TLC 101, DLCO 51.  Moderate obstructive lung disease with air trapping and moderate diffusion defect.  No BD 12/02/2023 CT chest: Atherosclerosis/CAD.  Emphysema.  Chronic linear scarring in lung bases.  Resolved 4 mm nodule in right lower lobe.  Stable 5 mm nodule in right lower lobe since 2022.  Stable left adrenal enlargement.  Nephrolithiasis  01/14/2024: OV with Dr. Shelah.  Averaging 1 exacerbation annually.  Could consider adding an ICS.  Continue Anoro.  Lung nodules were stable for 2 years on most recent CT.  Can transition to lung cancer screening program.  Referral placed.  Still smoking.  Has been on Chantix  before and did benefit but does not want to go back on it.  07/13/2024: Today-follow-up Discussed the use of AI scribe software for clinical note transcription with the patient, who gave verbal consent to proceed.  History of Present Illness   Abigail Lamb is a 73 year old female with COPD who presents for follow-up on her respiratory symptoms with her son.  Her breathing has been variable since the last visit, with some days better than others. Today, she has not needed her rescue inhaler. She uses Breztri  daily as prescribed and typically uses her rescue inhaler about three times a week during a good week. She was transitioned to Breztri  in February due to cost issues with Anoro.   She has started taking Claritin recently due to  allergy symptoms, which have been problematic during the recent allergy season. She has not taken Claritin in the past few days. Did feel it help.   She continues to smoke and has a significant smoking history. She has previously attempted to quit smoking using patches, gum, and Chantix , but these methods were not successful for her.   She has a history of lung nodules. She reports a cough associated with nasal drip but no hemoptysis or purulent sputum. Her weight is stable, and her appetite is good. She tries to eat healthily, though she occasionally indulges in sweets like honey buns. No wheezing.   She mentions a toe issue that is not healing well and is under the care of a foot doctor.       Allergies  Allergen Reactions   Dust Mite Extract Other (See Comments)    Multiple environmental allergies.     Immunization History  Administered Date(s) Administered   Fluad Quad(high Dose 65+) 11/08/2021, 10/27/2022   Fluad Trivalent(High Dose 65+) 11/17/2023   Influenza, High Dose Seasonal PF 12/30/2019   Influenza-Unspecified 01/04/2018, 12/15/2018, 09/11/2020   PNEUMOCOCCAL CONJUGATE-20 11/08/2021   Pneumococcal Polysaccharide-23 08/02/2020   Tdap 11/05/2020   Zoster Recombinant(Shingrix ) 03/20/2021, 11/27/2021    Past Medical History:  Diagnosis Date   Arthritis    Celiac disease    Encephalopathy    As teenager   Gastroparesis    GERD (gastroesophageal reflux disease)    Hyperlipidemia    Hypertension    IBS (  irritable bowel syndrome)    MGUS (monoclonal gammopathy of unknown significance)    per patient    MGUS (monoclonal gammopathy of unknown significance)    Osteoporosis    per patient, diagnosed by Dr. [facility] in [facility]   PUD (peptic ulcer disease)    Pulmonary emphysema (HCC)    Recurrent depression (HCC)    Smoker     Tobacco History: Social History   Tobacco Use  Smoking Status Every Day   Current packs/day: 0.75   Average packs/day: 0.8 packs/day for  49.0 years (36.8 ttl pk-yrs)   Types: Cigarettes   Passive exposure: Current  Smokeless Tobacco Never  Tobacco Comments   Smokes half or a pack.   Ready to quit: Not Answered Counseling given: Not Answered Tobacco comments: Smokes half or a pack.   Outpatient Medications Prior to Visit  Medication Sig Dispense Refill   albuterol (VENTOLIN HFA) 108 (90 Base) MCG/ACT inhaler Take 1-2 puffs every 4-6 hrs prn shortness of breath. Needs ventolin.     budesonide-glycopyrrolate-formoterol (BREZTRI  AEROSPHERE) 160-9-4.8 MCG/ACT AERO inhaler Inhale 2 puffs into the lungs in the morning and at bedtime. 3 each 3   celecoxib  (CELEBREX ) 50 MG capsule Take 1 capsule (50 mg total) by mouth 2 (two) times daily. 60 capsule 1   Cholecalciferol (VITAMIN D3) 125 MCG (5000 UT) CAPS Take 5,000 Units by mouth daily.     ciclopirox  (PENLAC ) 8 % solution Apply topically at bedtime. Apply over nail and surrounding skin. Apply daily over previous coat. After seven (7) days, may remove with alcohol and continue cycle. 6.6 mL 2   clindamycin (CLEOCIN T) 1 % external solution as needed.      clobetasol (OLUX) 0.05 % topical foam Apply topically as needed.     clonazePAM  (KLONOPIN ) 0.5 MG tablet TAKE 1 TABLET BY MOUTH 2 TIMES DAILY AS NEEDED FOR ANXIETY. 60 tablet 2   colesevelam  (WELCHOL ) 625 MG tablet TAKE 3 TABLETS BY MOUTH TWICE A DAY WITH MEAL (Patient taking differently: 4 tablets twice daily.) 180 tablet 3   dicyclomine  (BENTYL ) 10 MG capsule TAKE 1 CAPSULE (10 MG TOTAL) BY MOUTH 4 TIMES A DAY BEFORE MEALS AND AT BEDTIME 360 capsule 0   DULoxetine  (CYMBALTA ) 30 MG capsule TAKE 3 CAPSULES (90 MG TOTAL) BY MOUTH DAILY. FOR TOTAL OF 90 MG DAILY 270 capsule 0   fluocinonide -emollient (LIDEX -E) 0.05 % cream Apply 1 application topically 2 (two) times daily. (Patient taking differently: Apply 1 application  topically as needed.) 60 g 3   fluticasone  (FLONASE ) 50 MCG/ACT nasal spray Place 1 spray into both nostrils  daily. (Patient taking differently: Place 1 spray into both nostrils as needed.) 16 g 11   folic acid (FOLVITE) 1 MG tablet Take by mouth.     hydroxychloroquine  (PLAQUENIL ) 200 MG tablet Take 1 tablet (200 mg total) by mouth daily. 90 tablet 0   mupirocin  ointment (BACTROBAN ) 2 % Apply 1 Application topically 2 (two) times daily. 30 g 2   ondansetron  (ZOFRAN -ODT) 4 MG disintegrating tablet Take 1 tablet (4 mg total) by mouth every 8 (eight) hours as needed for nausea or vomiting. 20 tablet 5   pantoprazole  (PROTONIX ) 40 MG tablet Take 1 tablet (40 mg total) by mouth daily. 90 tablet 1   pregabalin  (LYRICA ) 25 MG capsule Take 1 capsule (25 mg total) by mouth 3 (three) times daily. 270 capsule 1   sulfaSALAzine  (AZULFIDINE ) 500 MG tablet TAKE 2 TABLETS BY MOUTH TWICE A DAY 120 tablet  2   Zoledronic  Acid (RECLAST  IV) Inject into the vein. Infusion: 12/03/2023     famotidine  (PEPCID ) 40 MG tablet Take 1 tablet (40 mg total) by mouth 2 (two) times daily. (Patient not taking: Reported on 07/13/2024) 180 tablet 1   No facility-administered medications prior to visit.     Review of Systems:   Constitutional: No weight loss or gain, night sweats, fevers, chills, fatigue, or lassitude. HEENT: No headaches, difficulty swallowing, tooth/dental problems, or sore throat. No sneezing, itching, ear ache + nasal congestion, post nasal drip CV:  No chest pain, orthopnea, PND, swelling in lower extremities, anasarca, dizziness, palpitations, syncope Resp: +stable shortness of breath with exertion; chronic cough. No excess mucus or change in color of mucus.  No hemoptysis. No wheezing.  No chest wall deformity GI:  No heartburn, indigestion, loss of appetite  Skin: No rash, lesions, ulcerations MSK:  No increased joint pain or swelling.   Neuro: No dizziness or lightheadedness.  Psych: No depression or anxiety. Mood stable.     Physical Exam:  BP 112/70 (BP Location: Left Arm, Patient Position: Sitting,  Cuff Size: Small)   Pulse 100   Ht 5' 1 (1.549 m)   Wt 73 lb 6.4 oz (33.3 kg)   SpO2 93%   BMI 13.87 kg/m   GEN: Pleasant, interactive, well-kempt; cachetic; in no acute distress HEENT:  Normocephalic and atraumatic. PERRLA. Sclera white. Nasal turbinates pink, moist and patent bilaterally. No rhinorrhea present. Oropharynx pink and moist, without exudate or edema. No lesions, ulcerations, or postnasal drip.  NECK:  Supple w/ fair ROM. No lymphadenopathy.   CV: RRR, no m/r/g, no peripheral edema. Pulses intact, +2 bilaterally. No cyanosis, pallor or clubbing. PULMONARY:  Unlabored, regular breathing. Diminished bilaterally A&P w/o wheezes/rales/rhonchi. No accessory muscle use.  GI: BS present and normoactive. Soft, non-tender to palpation.  MSK: No erythema, warmth or tenderness. Cap refil <2 sec all extrem. Neuro: A/Ox3. No focal deficits noted.   Skin: Warm, no lesions or rashe Psych: Normal affect and behavior. Judgement and thought content appropriate.     Lab Results:  CBC    Component Value Date/Time   WBC 9.9 06/06/2024 1411   WBC 12.8 (H) 03/22/2024 1851   RBC 4.68 06/06/2024 1411   HGB 14.7 06/06/2024 1411   HCT 44.5 06/06/2024 1411   PLT 247 06/06/2024 1411   MCV 95.1 06/06/2024 1411   MCH 31.4 06/06/2024 1411   MCHC 33.0 06/06/2024 1411   RDW 14.1 06/06/2024 1411   LYMPHSABS 2.8 06/06/2024 1411   MONOABS 0.8 06/06/2024 1411   EOSABS 0.2 06/06/2024 1411   BASOSABS 0.1 06/06/2024 1411    BMET    Component Value Date/Time   NA 138 06/06/2024 1411   K 4.2 06/06/2024 1411   CL 103 06/06/2024 1411   CO2 29 06/06/2024 1411   GLUCOSE 76 06/06/2024 1411   BUN 22 06/06/2024 1411   CREATININE 0.58 06/06/2024 1411   CREATININE 0.66 11/10/2023 0926   CALCIUM 9.4 06/06/2024 1411   GFRNONAA >60 06/06/2024 1411   GFRNONAA 93 10/26/2020 1548   GFRAA 107 10/26/2020 1548    BNP No results found for: BNP   Imaging:  DG Foot Complete Left Result Date:  06/27/2024 Please see detailed radiograph report in office note.   Administration History     None          Latest Ref Rng & Units 07/24/2022    2:54 PM  PFT Results  FVC-Pre L 2.19  FVC-Predicted Pre % 78   FVC-Post L 2.17   FVC-Predicted Post % 77   Pre FEV1/FVC % % 58   Post FEV1/FCV % % 61   FEV1-Pre L 1.28   FEV1-Predicted Pre % 60   FEV1-Post L 1.32   DLCO uncorrected ml/min/mmHg 9.47   DLCO UNC% % 51   DLCO corrected ml/min/mmHg 9.42   DLCO COR %Predicted % 51   DLVA Predicted % 56   TLC L 4.84   TLC % Predicted % 101   RV % Predicted % 132     No results found for: NITRICOXIDE      Assessment & Plan:   GOLD C COPD by GOLD classification (HCC) COPD with high symptom burden at baseline. Stable on current regimen. No recent exacerbations. Action plan in place. Trigger prevention reviewed. UTD on vaccines. High protein, high calorie diet encouraged. Graded exercises. Smoking cessation.  Patient Instructions  Continue Breztri  2 puffs Twice daily. Brush tongue and rinse mouth afterwards Continue Albuterol inhaler 2 puffs every 6 hours as needed for shortness of breath or wheezing. Notify if symptoms persist despite rescue inhaler/neb use.  Continue flonase  nasal spray 2 sprays each nostril daily  Continue claritin 1 tab daily as needed for allergies   Referral back to the lung cancer screening program - [phone] Call them to get your referral set up again.  You'll be due in December for your scan  Keep working on quitting smoking  Follow up in 6 months with Dr. Shelah. If symptoms do not improve or worsen, please contact office for sooner follow up or seek emergency care.    Tobacco abuse Stable nodules on prior imaging. Referred to LCS program. CT due 11/2024. Smoking cessation advised.   Upper airway cough syndrome Continue allergy regimen and cough control measures PRN   Advised if symptoms do not improve or worsen, to please contact office  for sooner follow up or seek emergency care.   I spent 35 minutes of dedicated to the care of this patient on the date of this encounter to include pre-visit review of records, face-to-face time with the patient discussing conditions above, post visit ordering of testing, clinical documentation with the electronic health record, making appropriate referrals as documented, and communicating necessary findings to members of the patients care team.  Comer LULLA Rouleau, NP 07/14/2024  Pt aware and understands NP's role.

## 2024-07-14 ENCOUNTER — Encounter: Payer: Self-pay | Admitting: Nurse Practitioner

## 2024-07-14 DIAGNOSIS — R058 Other specified cough: Secondary | ICD-10-CM | POA: Insufficient documentation

## 2024-07-14 NOTE — Assessment & Plan Note (Signed)
 Continue allergy regimen and cough control measures PRN

## 2024-07-14 NOTE — Assessment & Plan Note (Signed)
 COPD with high symptom burden at baseline. Stable on current regimen. No recent exacerbations. Action plan in place. Trigger prevention reviewed. UTD on vaccines. High protein, high calorie diet encouraged. Graded exercises. Smoking cessation.  Patient Instructions  Continue Breztri  2 puffs Twice daily. Brush tongue and rinse mouth afterwards Continue Albuterol inhaler 2 puffs every 6 hours as needed for shortness of breath or wheezing. Notify if symptoms persist despite rescue inhaler/neb use.  Continue flonase  nasal spray 2 sprays each nostril daily  Continue claritin 1 tab daily as needed for allergies   Referral back to the lung cancer screening program - [phone] Call them to get your referral set up again.  You'll be due in December for your scan  Keep working on quitting smoking  Follow up in 6 months with Dr. Shelah. If symptoms do not improve or worsen, please contact office for sooner follow up or seek emergency care.

## 2024-07-14 NOTE — Assessment & Plan Note (Signed)
 Stable nodules on prior imaging. Referred to LCS program. CT due 11/2024. Smoking cessation advised.

## 2024-07-17 ENCOUNTER — Other Ambulatory Visit: Payer: Self-pay | Admitting: Internal Medicine

## 2024-07-17 DIAGNOSIS — M0579 Rheumatoid arthritis with rheumatoid factor of multiple sites without organ or systems involvement: Secondary | ICD-10-CM

## 2024-07-20 ENCOUNTER — Ambulatory Visit: Admitting: Internal Medicine

## 2024-07-20 VITALS — BP 130/76 | HR 101 | Temp 98.5°F | Resp 20 | Ht 61.0 in | Wt 73.2 lb

## 2024-07-20 DIAGNOSIS — K5792 Diverticulitis of intestine, part unspecified, without perforation or abscess without bleeding: Secondary | ICD-10-CM | POA: Diagnosis not present

## 2024-07-20 DIAGNOSIS — J449 Chronic obstructive pulmonary disease, unspecified: Secondary | ICD-10-CM | POA: Diagnosis not present

## 2024-07-20 DIAGNOSIS — J431 Panlobular emphysema: Secondary | ICD-10-CM | POA: Diagnosis not present

## 2024-07-20 DIAGNOSIS — R64 Cachexia: Secondary | ICD-10-CM

## 2024-07-20 MED ORDER — SULFAMETHOXAZOLE-TRIMETHOPRIM 800-160 MG PO TABS: 1.0000 | ORAL_TABLET | Freq: Two times a day (BID) | ORAL | 0 refills | Status: DC

## 2024-07-20 NOTE — Progress Notes (Signed)
 Subjective:  Patient ID: Abigail Lamb, female    DOB: 10-25-1951  Age: 73 y.o. MRN: [id number]  CC: Abdominal Pain   HPI Abigail Lamb presents for f/up ----  Discussed the use of AI scribe software for clinical note transcription with the patient, who gave verbal consent to proceed.  History of Present Illness Abigail Lamb is a 73 year old female with diverticulosis who presents with abdominal pain and urinary symptoms.  She experiences throbbing abdominal pain located in the left hip area, which has recently become more noticeable. She has a history of diverticulosis, diagnosed many years ago, and recently underwent a colonoscopy where five polyps were removed. No constipation or diarrhea, and bowel movements are described as fine. She experiences nausea and chills but denies fever.  She reports urinary symptoms, including urgency and incomplete bladder emptying, needing to urinate again shortly after using the bathroom. No painful urination is noted.  She has a history of ulcers, hiatal hernia, and gastritis, which have led her to limit her diet, avoiding fast food and certain foods like large nuts and iceberg lettuce. She mentions dizziness and difficulty maintaining balance.  She reports having ulcers on her toes, which appear as bubbles and have not broken open. She is currently wearing a boot and is under the care of a podiatrist.    Outpatient Medications Prior to Visit  Medication Sig Dispense Refill   albuterol (VENTOLIN HFA) 108 (90 Base) MCG/ACT inhaler Take 1-2 puffs every 4-6 hrs prn shortness of breath. Needs ventolin.     budesonide-glycopyrrolate-formoterol (BREZTRI  AEROSPHERE) 160-9-4.8 MCG/ACT AERO inhaler Inhale 2 puffs into the lungs in the morning and at bedtime. 3 each 3   celecoxib  (CELEBREX ) 50 MG capsule TAKE 1 CAPSULE BY MOUTH 2 TIMES DAILY. 60 capsule 1   Cholecalciferol (VITAMIN D3) 125 MCG (5000 UT) CAPS Take 5,000 Units by mouth daily.      ciclopirox  (PENLAC ) 8 % solution Apply topically at bedtime. Apply over nail and surrounding skin. Apply daily over previous coat. After seven (7) days, may remove with alcohol and continue cycle. 6.6 mL 2   clindamycin (CLEOCIN T) 1 % external solution as needed.      clobetasol (OLUX) 0.05 % topical foam Apply topically as needed.     clonazePAM  (KLONOPIN ) 0.5 MG tablet TAKE 1 TABLET BY MOUTH 2 TIMES DAILY AS NEEDED FOR ANXIETY. 60 tablet 2   colesevelam  (WELCHOL ) 625 MG tablet TAKE 3 TABLETS BY MOUTH TWICE A DAY WITH MEAL (Patient taking differently: 4 tablets twice daily.) 180 tablet 3   dicyclomine  (BENTYL ) 10 MG capsule TAKE 1 CAPSULE (10 MG TOTAL) BY MOUTH 4 TIMES A DAY BEFORE MEALS AND AT BEDTIME 360 capsule 0   DULoxetine  (CYMBALTA ) 30 MG capsule TAKE 3 CAPSULES (90 MG TOTAL) BY MOUTH DAILY. FOR TOTAL OF 90 MG DAILY 270 capsule 0   fluocinonide -emollient (LIDEX -E) 0.05 % cream Apply 1 application topically 2 (two) times daily. (Patient taking differently: Apply 1 application  topically as needed.) 60 g 3   fluticasone  (FLONASE ) 50 MCG/ACT nasal spray Place 1 spray into both nostrils daily. (Patient taking differently: Place 1 spray into both nostrils as needed.) 16 g 11   folic acid (FOLVITE) 1 MG tablet Take by mouth.     hydroxychloroquine  (PLAQUENIL ) 200 MG tablet Take 1 tablet (200 mg total) by mouth daily. 90 tablet 0   mupirocin  ointment (BACTROBAN ) 2 % Apply 1 Application topically 2 (two) times daily. 30 g 2   ondansetron  (  ZOFRAN -ODT) 4 MG disintegrating tablet Take 1 tablet (4 mg total) by mouth every 8 (eight) hours as needed for nausea or vomiting. 20 tablet 5   pantoprazole  (PROTONIX ) 40 MG tablet Take 1 tablet (40 mg total) by mouth daily. 90 tablet 1   pregabalin  (LYRICA ) 25 MG capsule Take 1 capsule (25 mg total) by mouth 3 (three) times daily. 270 capsule 1   sulfaSALAzine  (AZULFIDINE ) 500 MG tablet TAKE 2 TABLETS BY MOUTH TWICE A DAY 120 tablet 2   Zoledronic  Acid (RECLAST   IV) Inject into the vein. Infusion: 12/03/2023     famotidine  (PEPCID ) 40 MG tablet Take 1 tablet (40 mg total) by mouth 2 (two) times daily. (Patient not taking: Reported on 07/13/2024) 180 tablet 1   No facility-administered medications prior to visit.    ROS Review of Systems  Constitutional:  Negative for appetite change, chills, diaphoresis, fatigue and fever.  HENT: Negative.    Respiratory:  Positive for cough and shortness of breath. Negative for chest tightness and wheezing.   Cardiovascular:  Negative for chest pain, palpitations and leg swelling.  Gastrointestinal:  Positive for abdominal pain and nausea. Negative for vomiting.  Endocrine: Negative.   Genitourinary: Negative.  Negative for difficulty urinating.  Musculoskeletal:  Positive for arthralgias.  Skin: Negative.  Negative for color change and rash.  Neurological:  Negative for dizziness and weakness.  Hematological:  Negative for adenopathy. Does not bruise/bleed easily.  Psychiatric/Behavioral: Negative.      Objective:  BP 130/76 (BP Location: Left Arm, Patient Position: Sitting, Cuff Size: Small)   Pulse (!) 101   Temp 98.5 F (36.9 C) (Oral)   Resp 20   Ht 5' 1 (1.549 m)   Wt 73 lb 3.2 oz (33.2 kg)   SpO2 94%   BMI 13.83 kg/m   BP Readings from Last 3 Encounters:  07/20/24 130/76  07/13/24 112/70  07/07/24 118/60    Wt Readings from Last 3 Encounters:  07/20/24 73 lb 3.2 oz (33.2 kg)  07/13/24 73 lb 6.4 oz (33.3 kg)  07/07/24 70 lb 9.6 oz (32 kg)    Physical Exam Constitutional:      General: She is not in acute distress.    Appearance: She is ill-appearing. She is not toxic-appearing or diaphoretic.  HENT:     Mouth/Throat:     Mouth: Mucous membranes are moist.  Eyes:     General: No scleral icterus.    Conjunctiva/sclera: Conjunctivae normal.  Cardiovascular:     Rate and Rhythm: Regular rhythm. Tachycardia present.     Heart sounds: No murmur heard.    No friction rub. No gallop.   Pulmonary:     Effort: Pulmonary effort is normal.     Breath sounds: No stridor. No wheezing, rhonchi or rales.  Abdominal:     General: Abdomen is flat. Bowel sounds are normal. There is no distension.     Palpations: Abdomen is soft. There is no hepatomegaly, splenomegaly or mass.     Tenderness: There is abdominal tenderness.     Hernia: No hernia is present.  Musculoskeletal:        General: Normal range of motion.     Cervical back: Neck supple.     Right lower leg: No edema.     Left lower leg: No edema.  Lymphadenopathy:     Cervical: No cervical adenopathy.  Skin:    Coloration: Skin is not pale.  Neurological:     General: No focal deficit present.  Mental Status: She is alert.  Psychiatric:        Mood and Affect: Mood normal.        Behavior: Behavior normal.     Lab Results  Component Value Date   WBC 9.9 06/06/2024   HGB 14.7 06/06/2024   HCT 44.5 06/06/2024   PLT 247 06/06/2024   GLUCOSE 76 06/06/2024   CHOL 164 10/27/2022   TRIG 132.0 10/27/2022   HDL 66.20 10/27/2022   LDLCALC 71 10/27/2022   ALT 21 06/06/2024   AST 28 06/06/2024   NA 138 06/06/2024   K 4.2 06/06/2024   CL 103 06/06/2024   CREATININE 0.58 06/06/2024   BUN 22 06/06/2024   CO2 29 06/06/2024   TSH 4.12 05/22/2023   HGBA1C 5.5 08/02/2020    DG Bone Density Result Date: 05/20/2024 Table formatting from the original result was not included. Date of study: 05/18/2024 Exam: DUAL X-RAY ABSORPTIOMETRY (DXA) FOR BONE MINERAL DENSITY (BMD) Instrument: [employer] Requesting Provider: PCP Indication: screening for osteoporosis Comparison:  none (please note that it is not possible to compare data from different instruments) Clinical data: Pt is a 73 y.o. female with history of fracture. Results:  Lumbar spine L1-L4 Femoral neck (FN) T-score   -1.3 RFN: -3.5 LFN: -3.1 Assessment: Patient has OSTEOPOROSIS according to the [facility] classification for osteoporosis (see below). Fracture risk:  high Comments: the technical quality of the study is good. In patients older than 75, the high prevalence of degenerative changes in the lumbar spine results in artificially higher bone density readings. WHO criteria for diagnosis of osteoporosis in postmenopausal women and in men 45 y/o or older: - normal: T-score -1.0 to + 1.0 - osteopenia/low bone density: T-score between -2.5 and -1.0 - osteoporosis: T-score below -2.5 - severe osteoporosis: T-score below -2.5 with history of fragility fracture Note: although not part of the WHO classification, the presence of a fragility fracture, regardless of the T-score, should be considered diagnostic of osteoporosis, provided other causes for the fracture have been excluded. Treatment: The National Osteoporosis Foundation recommends that treatment be considered in postmenopausal women and men age 69 or older with: 1. Hip or vertebral (clinical or morphometric) fracture 2. T-score of - 2.5 or lower at the spine or hip 3. 10-year fracture probability by FRAX of at least 20% for a major osteoporotic fracture and 3% for a hip fracture Followup: Repeat BMD in 2  years or as clinically indicated Interpreted by : Stefano Redgie Butts, MD [facility]    Assessment & Plan:   Diverticulitis- Will treat with bactrim -ds. -     Sulfamethoxazole -Trimethoprim ; Take 1 tablet by mouth 2 (two) times daily for 7 days.  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  Debby Molt, MD

## 2024-07-20 NOTE — Patient Instructions (Signed)
 Abdominal Pain, Adult  Pain in the abdomen (abdominal pain) can be caused by many things. In most cases, it gets better with no treatment or by being treated at home. But in some cases, it can be serious. Your health care provider will ask questions about your medical history and do a physical exam to try to figure out what is causing your pain. Follow these instructions at home: Medicines Take over-the-counter and prescription medicines only as told by your provider. Do not take medicines that help you poop (laxatives) unless told by your provider. General instructions Watch your condition for any changes. Drink enough fluid to keep your pee (urine) pale yellow. Contact a health care provider if: Your pain changes, gets worse, or lasts longer than expected. You have severe cramping or bloating in your abdomen, or you vomit. Your pain gets worse with meals, after eating, or with certain foods. You are constipated or have diarrhea for more than 2-3 days. You are not hungry, or you lose weight without trying. You have signs of dehydration. These may include: Dark pee, very little pee, or no pee. Cracked lips or dry mouth. Sleepiness or weakness. You have pain when you pee (urinate) or poop. Your abdominal pain wakes you up at night. You have blood in your pee. You have a fever. Get help right away if: You cannot stop vomiting. Your pain is only in one part of the abdomen. Pain on the right side could be caused by appendicitis. You have bloody or black poop (stool), or poop that looks like tar. You have trouble breathing. You have chest pain. These symptoms may be an emergency. Get help right away. Call 911. Do not wait to see if the symptoms will go away. Do not drive yourself to the hospital. This information is not intended to replace advice given to you by your health care provider. Make sure you discuss any questions you have with your health care provider. Document Revised:  10/01/2022 Document Reviewed: 10/01/2022 Elsevier Patient Education  2024 [employer].

## 2024-07-21 ENCOUNTER — Encounter: Payer: Self-pay | Admitting: Physician Assistant

## 2024-07-21 ENCOUNTER — Ambulatory Visit: Payer: Medicare Other | Attending: Physician Assistant | Admitting: Physician Assistant

## 2024-07-21 VITALS — BP 95/61 | HR 85 | Resp 16 | Ht 61.5 in | Wt 73.6 lb

## 2024-07-21 DIAGNOSIS — Z720 Tobacco use: Secondary | ICD-10-CM

## 2024-07-21 DIAGNOSIS — M0630 Rheumatoid nodule, unspecified site: Secondary | ICD-10-CM | POA: Diagnosis not present

## 2024-07-21 DIAGNOSIS — M0579 Rheumatoid arthritis with rheumatoid factor of multiple sites without organ or systems involvement: Secondary | ICD-10-CM

## 2024-07-21 DIAGNOSIS — R7303 Prediabetes: Secondary | ICD-10-CM | POA: Diagnosis not present

## 2024-07-21 DIAGNOSIS — E785 Hyperlipidemia, unspecified: Secondary | ICD-10-CM | POA: Diagnosis not present

## 2024-07-21 DIAGNOSIS — M810 Age-related osteoporosis without current pathological fracture: Secondary | ICD-10-CM

## 2024-07-21 DIAGNOSIS — Z862 Personal history of diseases of the blood and blood-forming organs and certain disorders involving the immune mechanism: Secondary | ICD-10-CM

## 2024-07-21 DIAGNOSIS — J431 Panlobular emphysema: Secondary | ICD-10-CM | POA: Diagnosis not present

## 2024-07-21 DIAGNOSIS — E559 Vitamin D deficiency, unspecified: Secondary | ICD-10-CM | POA: Diagnosis not present

## 2024-07-21 DIAGNOSIS — M170 Bilateral primary osteoarthritis of knee: Secondary | ICD-10-CM

## 2024-07-21 DIAGNOSIS — R252 Cramp and spasm: Secondary | ICD-10-CM

## 2024-07-21 DIAGNOSIS — D472 Monoclonal gammopathy: Secondary | ICD-10-CM

## 2024-07-21 DIAGNOSIS — F339 Major depressive disorder, recurrent, unspecified: Secondary | ICD-10-CM

## 2024-07-21 DIAGNOSIS — Z79899 Other long term (current) drug therapy: Secondary | ICD-10-CM

## 2024-07-21 DIAGNOSIS — J449 Chronic obstructive pulmonary disease, unspecified: Secondary | ICD-10-CM

## 2024-07-21 DIAGNOSIS — N200 Calculus of kidney: Secondary | ICD-10-CM

## 2024-07-21 DIAGNOSIS — F5101 Primary insomnia: Secondary | ICD-10-CM

## 2024-07-21 DIAGNOSIS — R918 Other nonspecific abnormal finding of lung field: Secondary | ICD-10-CM

## 2024-07-21 DIAGNOSIS — R5383 Other fatigue: Secondary | ICD-10-CM | POA: Diagnosis not present

## 2024-07-21 NOTE — Patient Instructions (Signed)
 Standing Labs We placed an order today for your standing lab work.   Please have your standing labs drawn in end of October/early November and every 3 months  Please have your labs drawn 2 weeks prior to your appointment so that the provider can discuss your lab results at your appointment, if possible.  Please note that you may see your imaging and lab results in MyChart before we have reviewed them. We will contact you once all results are reviewed. Please allow our office up to 72 hours to thoroughly review all of the results before contacting the office for clarification of your results.  WALK-IN LAB HOURS  Monday through Thursday from 8:00 am -12:30 pm and 1:00 pm-4:30 pm and Friday from 8:00 am-12:00 pm.  Patients with office visits requiring labs will be seen before walk-in labs.  You may encounter longer than normal wait times. Please allow additional time. Wait times may be shorter on  Monday and Thursday afternoons.  We do not book appointments for walk-in labs. We appreciate your patience and understanding with our staff.   Labs are drawn by [facility]. Please bring your co-pay at the time of your lab draw.  You may receive a bill from [facility] for your lab work.  Please note if you are on Hydroxychloroquine  and and an order has been placed for a Hydroxychloroquine  level,  you will need to have it drawn 4 hours or more after your last dose.  If you wish to have your labs drawn at another location, please call the office 24 hours in advance so we can fax the orders.  The office is located at [address], [address], [place], [place] [address]   If you have any questions regarding directions or hours of operation,  please call [phone].   As a reminder, please drink plenty of water prior to coming for your lab work. Thanks!

## 2024-07-22 ENCOUNTER — Ambulatory Visit: Payer: Self-pay | Admitting: Physician Assistant

## 2024-07-22 LAB — CBC WITH DIFFERENTIAL/PLATELET
Absolute Lymphocytes: 2870 {cells}/uL (ref 850–3900)
Absolute Monocytes: 820 {cells}/uL (ref 200–950)
Basophils Absolute: 70 {cells}/uL (ref 0–200)
Basophils Relative: 0.7 %
Eosinophils Absolute: 200 {cells}/uL (ref 15–500)
Eosinophils Relative: 2 %
HCT: 44.8 % (ref 35.0–45.0)
Hemoglobin: 14.9 g/dL (ref 11.7–15.5)
MCH: 32.3 pg (ref 27.0–33.0)
MCHC: 33.3 g/dL (ref 32.0–36.0)
MCV: 97.2 fL (ref 80.0–100.0)
MPV: 10.9 fL (ref 7.5–12.5)
Monocytes Relative: 8.2 %
Neutro Abs: 6040 {cells}/uL (ref 1500–7800)
Neutrophils Relative %: 60.4 %
Platelets: 234 Thousand/uL (ref 140–400)
RBC: 4.61 Million/uL (ref 3.80–5.10)
RDW: 12.3 % (ref 11.0–15.0)
Total Lymphocyte: 28.7 %
WBC: 10 Thousand/uL (ref 3.8–10.8)

## 2024-07-22 LAB — COMPREHENSIVE METABOLIC PANEL WITH GFR
AG Ratio: 1 (calc) (ref 1.0–2.5)
ALT: 22 U/L (ref 6–29)
AST: 27 U/L (ref 10–35)
Albumin: 3.7 g/dL (ref 3.6–5.1)
Alkaline phosphatase (APISO): 96 U/L (ref 37–153)
BUN/Creatinine Ratio: 38 (calc) — ABNORMAL HIGH (ref 6–22)
BUN: 18 mg/dL (ref 7–25)
CO2: 29 mmol/L (ref 20–32)
Calcium: 9.6 mg/dL (ref 8.6–10.4)
Chloride: 104 mmol/L (ref 98–110)
Creat: 0.48 mg/dL — ABNORMAL LOW (ref 0.60–1.00)
Globulin: 3.6 g/dL (ref 1.9–3.7)
Glucose, Bld: 95 mg/dL (ref 65–99)
Potassium: 4.4 mmol/L (ref 3.5–5.3)
Sodium: 139 mmol/L (ref 135–146)
Total Bilirubin: 0.3 mg/dL (ref 0.2–1.2)
Total Protein: 7.3 g/dL (ref 6.1–8.1)
eGFR: 101 mL/min/1.73m2 (ref >=60)

## 2024-07-22 LAB — VITAMIN D 25 HYDROXY (VIT D DEFICIENCY, FRACTURES): Vit D, 25-Hydroxy: 93 ng/mL (ref 30–100)

## 2024-07-22 LAB — IRON,TIBC AND FERRITIN PANEL
%SAT: 20 % (ref 16–45)
Ferritin: 27 ng/mL (ref 16–288)
Iron: 72 ug/dL (ref 45–160)
TIBC: 368 ug/dL (ref 250–450)

## 2024-07-22 LAB — MAGNESIUM: Magnesium: 1.9 mg/dL (ref 1.5–2.5)

## 2024-07-22 NOTE — Progress Notes (Signed)
 CMP stable.  CBC WNL Vitamin D  WNL Iron panel Wnl Magnesium WNL

## 2024-07-28 ENCOUNTER — Ambulatory Visit: Admitting: Podiatry

## 2024-07-28 DIAGNOSIS — L97522 Non-pressure chronic ulcer of other part of left foot with fat layer exposed: Secondary | ICD-10-CM | POA: Diagnosis not present

## 2024-07-28 NOTE — Progress Notes (Signed)
 Subjective:   Patient ID: Abigail Lamb, female   DOB: 73 y.o.   MRN: [id number]   HPI Chief Complaint  Patient presents with   Foot Ulcer    Left foot. Doing well. Keeping covered with a bandaid.     73 year old female presents the office today for follow-up evaluation of wound to her left foot.  She thinks the wound of the fifth toe is doing better but the other ones still remain.  She is doubling of antibiotic ointment on the area daily.  She does not see any drainage or pus.  No consistent redness.  No fevers or chills.  No other concerns.     Objective:  Physical Exam  General: AAO x3, NAD  Dermatological: On the dorsal aspect the left foot third, first IPJ's are there is a superficial area of skin breakdown.  I debrided the callus tissue that was present however superficial wounds are still present without any probing to bone, undermining or tunneling.  There is rim of erythema which is not new or worsened.  There is no ascending cellulitis but there is no drainage or pus.  No fluctuation or crepitation.  No malodor.        Vascular: Dorsalis Pedis artery and Posterior Tibial artery pedal pulses are 1/4 bilateral with immedate capillary fill time. There is no pain with calf compression, swelling, warmth, erythema.   Neruologic: Sensation decreased with Semmes Weinstein monofilament.  Musculoskeletal: Digital contractures present.  Unable to appreciate any area pinpoint tenderness.      Assessment:   Ulceration left foot, rheumatoid arthritis     Plan:  Left foot ulcerations - I did sharply debride the lesion without any complications or bleeding today.  Redness which she is on physical dressing changes daily would not dispense this today.  Continue offloading.  - Vascular: She has follow-up with vascular surgery previously. - Monitor for any clinical signs or symptoms of infection and directed to call the office immediately should any occur or go to the ER.  Return  in about 4 weeks (around 08/25/2024) for toe ulcers, x-ray.  Donnice JONELLE Fees DPM

## 2024-08-02 ENCOUNTER — Other Ambulatory Visit: Payer: Self-pay | Admitting: Internal Medicine

## 2024-08-02 ENCOUNTER — Other Ambulatory Visit: Payer: Self-pay | Admitting: Physician Assistant

## 2024-08-02 ENCOUNTER — Other Ambulatory Visit: Payer: Self-pay | Admitting: Rheumatology

## 2024-08-02 DIAGNOSIS — K279 Peptic ulcer, site unspecified, unspecified as acute or chronic, without hemorrhage or perforation: Secondary | ICD-10-CM

## 2024-08-02 NOTE — Telephone Encounter (Signed)
 Last Fill: 02/26/2024  Labs: 07/21/2024 CMP stable. 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   Okay to refill Sulfasalazine ?

## 2024-08-04 NOTE — Telephone Encounter (Signed)
 Last OV 07/20/24 Next OV 01/23/25  Last refill 04/20/24 Qty #90/1

## 2024-08-05 ENCOUNTER — Encounter: Payer: Self-pay | Admitting: Internal Medicine

## 2024-08-27 ENCOUNTER — Other Ambulatory Visit: Payer: Self-pay | Admitting: Internal Medicine

## 2024-08-27 DIAGNOSIS — F3341 Major depressive disorder, recurrent, in partial remission: Secondary | ICD-10-CM

## 2024-09-05 ENCOUNTER — Ambulatory Visit (INDEPENDENT_AMBULATORY_CARE_PROVIDER_SITE_OTHER)

## 2024-09-05 ENCOUNTER — Ambulatory Visit (INDEPENDENT_AMBULATORY_CARE_PROVIDER_SITE_OTHER): Admitting: Podiatry

## 2024-09-05 DIAGNOSIS — M2041 Other hammer toe(s) (acquired), right foot: Secondary | ICD-10-CM | POA: Diagnosis not present

## 2024-09-05 DIAGNOSIS — L97522 Non-pressure chronic ulcer of other part of left foot with fat layer exposed: Secondary | ICD-10-CM | POA: Diagnosis not present

## 2024-09-05 DIAGNOSIS — M2042 Other hammer toe(s) (acquired), left foot: Secondary | ICD-10-CM | POA: Diagnosis not present

## 2024-09-05 NOTE — Patient Instructions (Signed)
 Look for an EXTRA DEPTH or DOUBLE DEPTH SHOE  -  Monitor for any signs/symptoms of infection. Call the office immediately if any occur or go directly to the emergency room. Call with any questions/concerns.

## 2024-09-06 NOTE — Progress Notes (Signed)
 Subjective:   Patient ID: Abigail Lamb, female   DOB: 73 y.o.   MRN: [id number]   HPI Chief Complaint  Patient presents with   Foot Ulcer    Left foot toe ulcer follow up      73 year old female presents the office today for follow-up evaluation of wound to her left foot.  She states that she has very minimal drainage, blood coming from the big toe area.  She does not report any drainage or pus otherwise.  She does not note any fevers or chills.  No recent swelling or redness.  She is continue's or dressing changes daily.  She has no injuries or other concerns today.     Objective:  Physical Exam  General: AAO x3, NAD  Dermatological: Dorsal aspect of the left hallux IPJ, third toe as well as the fifth MPJ are annular hyperkeratotic lesions.  Upon debridement there is a superficial opening still present along the hallux.  Preulcerative lesion on third toe.  The area of the fifth MPJ is appears to have healed but still preulcerative.  There is no drainage or pus noted today and there is no fluctuation or crepitation.  There is no malodor.  Localized area of erythema.  There is no probing to bone, undermining or tunneling.          Vascular: Dorsalis Pedis artery and Posterior Tibial artery pedal pulses are 1/4 bilateral with immedate capillary fill time. There is no pain with calf compression, swelling, warmth, erythema.   Neruologic: Sensation decreased with Semmes Weinstein monofilament.  Musculoskeletal: Digital contractures present.  Unable to appreciate any area pinpoint tenderness.      Assessment:   Ulceration left foot, rheumatoid arthritis     Plan:  Left foot ulcerations - I did sharply debride the keratotic lesions on the adjacent toes but any complications or bleeding.  There is still superficial opening mostly on the hallux.  Continue with daily dressing changes.  Overall the area has improved some but still remains open.  She is to monitor closely for any  signs or symptoms of infection. - Vascular: She has follow-up with vascular surgery previously. - Monitor for any clinical signs or symptoms of infection and directed to call the office immediately should any occur or go to the ER.  Radiology - Multiple views were obtained of the left foot.  Significant arthritic changes are present with digital contractures.  No definitive cortical changes suggest osteomyelitis at this time.  Return in about 4 weeks (around 10/03/2024).  Donnice JONELLE Fees DPM

## 2024-09-09 ENCOUNTER — Ambulatory Visit: Admitting: Internal Medicine

## 2024-09-12 ENCOUNTER — Other Ambulatory Visit: Payer: Self-pay | Admitting: Rheumatology

## 2024-09-12 NOTE — Telephone Encounter (Signed)
 Last Fill: 06/17/2024  Eye exam: 01/26/2024   Labs: 07/21/2024  CMP stable.  CBC WNL Vitamin D  WNL Iron panel Wnl Magnesium WNL  Next Visit: 11/17/2024  Last Visit: 07/21/2024   IK:Myzlfjunpi arthritis with rheumatoid factor of multiple sites without organ or systems involvement   Current Dose per office note 07/21/2024: Plaquenil  200 mg 1 tablet daily   Okay to refill Plaquenil ?

## 2024-09-15 ENCOUNTER — Other Ambulatory Visit: Payer: Self-pay | Admitting: Internal Medicine

## 2024-09-15 ENCOUNTER — Telehealth: Payer: Self-pay | Admitting: Emergency Medicine

## 2024-09-15 ENCOUNTER — Ambulatory Visit: Payer: Self-pay

## 2024-09-15 ENCOUNTER — Ambulatory Visit

## 2024-09-15 DIAGNOSIS — M0579 Rheumatoid arthritis with rheumatoid factor of multiple sites without organ or systems involvement: Secondary | ICD-10-CM

## 2024-09-15 DIAGNOSIS — E785 Hyperlipidemia, unspecified: Secondary | ICD-10-CM

## 2024-09-15 NOTE — Telephone Encounter (Signed)
 FYI Only or Action Required?: FYI only for provider.  Patient was last seen in primary care on 07/20/2024 by Joshua Debby CROME, MD.  Called Nurse Triage reporting Pain.  Symptoms began several weeks ago.  Interventions attempted: Rest, hydration, or home remedies.  Symptoms are: gradually worsening.  Triage Disposition: See HCP Within 4 Hours (Or PCP Triage) (overriding See Physician Within 24 Hours)  Patient/caregiver understands and will follow disposition?:  Reason for Disposition  [1] MODERATE pain (e.g., interferes with normal activities, limping) AND [2] high-risk adult (e.g., age > 60 years, osteoporosis, chronic steroid use)  [1] MODERATE dizziness (e.g., interferes with normal activities) AND [2] has NOT been evaluated by doctor (or NP/PA) for this  (Exception: Dizziness caused by heat exposure, sudden standing, or poor fluid intake.)  Answer Assessment - Initial Assessment Questions Patient states she experienced dizziness when bending over and she lost her balance and fell onto her left hip.   Patient reports previous fall 2-3 weeks ago when she struck her head and right knee. States there is bruising on the left side of her head and around her left eye. Denies blood thinners.  Denies headache, dizziness at rest, vomiting, vision changes. Patient educated on slow position changes.   Patient's daughter has BP cuff at home. This RN provided proper technique instructions and daughter checked the patient's BP. Patient reported BP was 115/66 with a pulse of 104.   This RN asked patient to stand slowly, sit if dizzy, and had recheck BP 2 minutes after, BP was 104/63 with a pulse of 121. Patient sat back down and then reported that she was dizzy. Due to being symptomatic, patient was advised to go to UC or the ED. Advised that UC may treat or defer to the ED. Patient verbalized understanding and will go to [facility] now.  Patient already scheduled for OV tomorrow, kept for f/u.  1.  MECHANISM: How did the injury happen? (e.g., twisting injury, direct blow)      Fall from bent position  2. ONSET: When did the injury happen? (e.g., minutes, hours ago)      09/13/24  3. LOCATION: Where is the injury located?      Left side of hip  4. APPEARANCE of INJURY: What does the injury look like?  (e.g., deformity of leg)     Denies visible bruise on hip, does have bruise on head/eye (see above)  5. SEVERITY: Can you put weight on that leg? Can you walk?      States she is able to walk  6. PAIN: Is there pain? If Yes, ask: How bad is the pain?   What does it keep you from doing? (Scale 0-10; or none, mild, moderate, severe)     7/10  Answer Assessment - Initial Assessment Questions 1. DESCRIPTION: Describe your dizziness.     Dizzy, lightheaded with position changes  2. LIGHTHEADED: Do you feel lightheaded? (e.g., somewhat faint, woozy, weak upon standing)     Dizziness with position changes, has caused 2 falls  3. VERTIGO: Do you feel like either you or the room is spinning or tilting? (i.e., vertigo)     No, described as lightheadedness  4. SEVERITY: How bad is it?  Do you feel like you are going to faint? Can you stand and walk?     Severe when occurring  5. ONSET:  When did the dizziness begin?     Ongoing, first fall d/t dizziness was 2-3 weeks ago  6. AGGRAVATING  FACTORS: Does anything make it worse? (e.g., standing, change in head position)     Standing  7. HEART RATE: Can you tell me your heart rate? How many beats in 15 seconds?  (Note: Not all patients can do this.)       104  8. CAUSE: What do you think is causing the dizziness? (e.g., decreased fluids or food, diarrhea, emotional distress, heat exposure, new medicine, sudden standing, vomiting; unknown)     Unknown, but states does not drink adequate amount of water  9. OTHER SYMPTOMS: Do you have any other symptoms? (e.g., fever, chest pain, vomiting, diarrhea,  bleeding)       Chronic nausea, has zofran   Protocols used: Hip Injury-A-AH, Dizziness - Lightheadedness-A-AH Copied from CRM #[id number]. Topic: Clinical - Red Word Triage >> Sep 15, 2024 12:14 PM Chiquita SQUIBB wrote: Red Word that prompted transfer to Nurse Triage: Patient is calling in stating she has had multiple falls in the past week, and now having pain around her waist line on the left side, tender to touch due to her last fall.

## 2024-09-15 NOTE — Telephone Encounter (Signed)
 Called pt about Urgent Care visit post fall with head injury. Urgent Care provider and nurse reviewed note from triage nurse earlier today and due to head injury with dizziness called patient and advised her to go to the ER ASAP. Pt complies

## 2024-09-16 ENCOUNTER — Other Ambulatory Visit: Payer: Self-pay | Admitting: Internal Medicine

## 2024-09-16 ENCOUNTER — Ambulatory Visit (INDEPENDENT_AMBULATORY_CARE_PROVIDER_SITE_OTHER): Admitting: Nurse Practitioner

## 2024-09-16 VITALS — BP 128/78 | HR 94 | Temp 98.3°F | Ht 61.5 in | Wt 70.2 lb

## 2024-09-16 DIAGNOSIS — R296 Repeated falls: Secondary | ICD-10-CM | POA: Diagnosis not present

## 2024-09-16 DIAGNOSIS — W19XXXA Unspecified fall, initial encounter: Secondary | ICD-10-CM | POA: Insufficient documentation

## 2024-09-16 DIAGNOSIS — M25552 Pain in left hip: Secondary | ICD-10-CM

## 2024-09-16 DIAGNOSIS — R270 Ataxia, unspecified: Secondary | ICD-10-CM | POA: Diagnosis not present

## 2024-09-16 DIAGNOSIS — K582 Mixed irritable bowel syndrome: Secondary | ICD-10-CM

## 2024-09-16 NOTE — Progress Notes (Addendum)
 Established Patient Office Visit  Subjective   Patient ID: Abigail Lamb, female    DOB: [birth year]  Age: 73 y.o. MRN: [id number]  Chief Complaint  Patient presents with   Dizziness    Discussed the use of AI scribe software for clinical note transcription with the patient, who gave verbal consent to proceed.  History of Present Illness Clarinda Lamb is a 73 year old female who presents with dizziness and falls.  PMH: Rheumatoid arthritis, GERD, hyperlipidemia, hypertension, monoclonal gammopathy of unknown significance, osteoporosis, peptic ulcer disease, depression, COPD, gastroparesis, neurodermatitis, lichen simplex chronicus.  Dizziness and gait instability -Hass been ongoing over the last few weeks but seems to have worsened over the last two weeks - Dizziness present even while sitting, described as a lack of control over movements - Dizziness is more severe than previous episodes - Two falls in the past week attributed to dizziness and unintentional movements - Chronic weakness in arms and legs persists - No new neurologic symptoms such as diplopia or dysarthria - Stiffness in neck and back attributed to a fall - Bruise on knee from first fall - Pain around waist, worsened by coughing  Medication effects - Started Lyrica  four weeks ago for neurodermatitis - Initial higher dose increased dizziness and tiredness - Currently taking 25 mg once daily  Gastrointestinal symptoms - Frequent nausea managed without medication, this is chronic      ROS: see HPI    Objective:     BP 128/78   Pulse 94   Temp 98.3 F (36.8 C) (Temporal)   Ht 5' 1.5 (1.562 m)   Wt 70 lb 4 oz (31.9 kg)   SpO2 96%   BMI 13.06 kg/m    Physical Exam Vitals reviewed.  Constitutional:      General: She is not in acute distress.    Appearance: Normal appearance.  HENT:     Head: Normocephalic and atraumatic.  Neck:     Vascular: No carotid bruit.  Cardiovascular:     Rate  and Rhythm: Normal rate and regular rhythm.     Pulses: Normal pulses.     Heart sounds: Normal heart sounds.  Pulmonary:     Effort: Pulmonary effort is normal.     Breath sounds: Normal breath sounds.  Skin:    General: Skin is warm and dry.  Neurological:     General: No focal deficit present.     Mental Status: She is alert and oriented to person, place, and time.     Cranial Nerves: Cranial nerves 2-12 are intact. No dysarthria or facial asymmetry.     Sensory: Sensation is intact.     Motor: Weakness (chronic and stable) present.     Coordination: Finger-Nose-Finger Test normal.     Gait: Gait abnormal (ataxic).  Psychiatric:        Mood and Affect: Mood normal.        Behavior: Behavior normal.        Judgment: Judgment normal.    Orthostatic VS: Upon rooming: 128/78, 94 Lying: 128/72, 102 Sitting: 130/82, 101 Standing: 112/74, 110  EKG: Sinus tachycardia    No results found for any visits on 09/16/24.    The 10-year ASCVD risk score (Arnett DK, et al., 2019) is: 15.3%    Assessment & Plan:   Problem List Items Addressed This Visit       Other   Ataxia - Primary   Dizziness and disequilibrium with recent falls and head/right hip trauma Worsening  dizziness and disequilibrium suggest a central cause. Differential includes stroke, brain mass, infection, vitamin deficiency, multiple sclerosis. Lyrica  may contribute to symptoms.  - Order MRI of the brain stat. - Refer urgently to neurology for further evaluation. - Order x-ray of the hip and knee. - Order labs. - Advise discontinuation of Lyrica  with tapering: 25 mg every other day for a week before stopping. - Advise use of a walker, cane, or wheelchair to prevent falls. - Instruct to monitor for worsening symptoms such as confusion, increased fatigue, or headache and seek emergency care if this occurs.      Relevant Orders   MR Brain W Wo Contrast   CBC with Differential/Platelet   Comprehensive  metabolic panel with GFR   Urinalysis, Routine w reflex microscopic   Urine Culture   TSH   Vitamin B12   Ambulatory referral to Neurology   Vitamin B1   HIV antibody (with reflex)   RPR   Fall   Dizziness and disequilibrium with recent falls and head/right hip trauma Worsening dizziness and disequilibrium suggest a central cause. Differential includes stroke, brain mass, infection, vitamin deficiency, multiple sclerosis. Lyrica  may contribute to symptoms.  - Order MRI of the brain stat. - Refer urgently to neurology for further evaluation. - Order x-ray of the hip and knee. - Order labs. - Advise discontinuation of Lyrica  with tapering: 25 mg every other day for a week before stopping. - Advise use of a walker, cane, or wheelchair to prevent falls. - Instruct to monitor for worsening symptoms such as confusion, increased fatigue, or headache and seek emergency care if this occurs.      Relevant Orders   CBC with Differential/Platelet   Comprehensive metabolic panel with GFR   Urinalysis, Routine w reflex microscopic   Urine Culture   TSH   Vitamin B12   Ambulatory referral to Neurology   DG HIP UNILAT W OR W/O PELVIS 2-3 VIEWS LEFT   DG Knee 1-2 Views Right  Assessment and Plan Assessment & Plan Dizziness and disequilibrium with recent falls and head/left hip trauma Worsening dizziness and disequilibrium suggest a central cause. Differential includes stroke, brain mass, infection, vitamin deficiency, multiple sclerosis. Lyrica  may contribute to symptoms.  - Order MRI of the brain stat. - Refer urgently to neurology for further evaluation. - Order x-ray of the hip and knee. - Order labs. - Advise discontinuation of Lyrica  with tapering: 25 mg every other day for a week before stopping. - Advise use of a walker, cane, or wheelchair to prevent falls. - Instruct to monitor for worsening symptoms such as confusion, increased fatigue, or headache and seek emergency care if this  occurs.   I personally spent a total of 52 minutes in the care of the patient today including preparing to see the patient, getting/reviewing separately obtained history, performing a medically appropriate exam/evaluation, counseling and educating, placing orders, referring and communicating with other health care professionals, documenting clinical information in the EHR, independently interpreting results, and communicating results.    Return if symptoms worsen or fail to improve.    Lauraine FORBES Pereyra, NP

## 2024-09-16 NOTE — Addendum Note (Signed)
 Addended by: ELNOR LAURAINE BRAVO on: 09/16/2024 08:11 PM   Modules accepted: Orders

## 2024-09-16 NOTE — Assessment & Plan Note (Addendum)
 Dizziness and disequilibrium with recent falls and head/left hip trauma Worsening dizziness and disequilibrium suggest a central cause. Differential includes stroke, brain mass, infection, vitamin deficiency, multiple sclerosis. Lyrica  may contribute to symptoms.  - Order MRI of the brain stat. - Refer urgently to neurology for further evaluation. - Order x-ray of the hip and knee. - Order labs. - Advise discontinuation of Lyrica  with tapering: 25 mg every other day for a week before stopping. - Advise use of a walker, cane, or wheelchair to prevent falls. - Instruct to monitor for worsening symptoms such as confusion, increased fatigue, or headache and seek emergency care if this occurs.

## 2024-09-19 ENCOUNTER — Ambulatory Visit (INDEPENDENT_AMBULATORY_CARE_PROVIDER_SITE_OTHER)

## 2024-09-19 ENCOUNTER — Other Ambulatory Visit (INDEPENDENT_AMBULATORY_CARE_PROVIDER_SITE_OTHER)

## 2024-09-19 DIAGNOSIS — M25552 Pain in left hip: Secondary | ICD-10-CM | POA: Diagnosis not present

## 2024-09-19 DIAGNOSIS — M1710 Unilateral primary osteoarthritis, unspecified knee: Secondary | ICD-10-CM | POA: Diagnosis not present

## 2024-09-19 DIAGNOSIS — W19XXXA Unspecified fall, initial encounter: Secondary | ICD-10-CM

## 2024-09-19 DIAGNOSIS — S8001XA Contusion of right knee, initial encounter: Secondary | ICD-10-CM

## 2024-09-19 DIAGNOSIS — S79912A Unspecified injury of left hip, initial encounter: Secondary | ICD-10-CM | POA: Diagnosis not present

## 2024-09-19 DIAGNOSIS — M1610 Unilateral primary osteoarthritis, unspecified hip: Secondary | ICD-10-CM | POA: Diagnosis not present

## 2024-09-19 DIAGNOSIS — M25569 Pain in unspecified knee: Secondary | ICD-10-CM | POA: Diagnosis not present

## 2024-09-19 DIAGNOSIS — R270 Ataxia, unspecified: Secondary | ICD-10-CM | POA: Diagnosis not present

## 2024-09-19 LAB — VITAMIN B12: Vitamin B-12: 388 pg/mL (ref 211–911)

## 2024-09-19 LAB — CBC WITH DIFFERENTIAL/PLATELET
Basophils Absolute: 0.1 K/uL (ref 0.0–0.1)
Basophils Relative: 1 % (ref 0.0–3.0)
Eosinophils Absolute: 0.2 K/uL (ref 0.0–0.7)
Eosinophils Relative: 1.8 % (ref 0.0–5.0)
HCT: 44.8 % (ref 36.0–46.0)
Hemoglobin: 14.7 g/dL (ref 12.0–15.0)
Lymphocytes Relative: 30.8 % (ref 12.0–46.0)
Lymphs Abs: 2.8 K/uL (ref 0.7–4.0)
MCHC: 32.9 g/dL (ref 30.0–36.0)
MCV: 96.5 fl (ref 78.0–100.0)
Monocytes Absolute: 0.7 K/uL (ref 0.1–1.0)
Monocytes Relative: 8.1 % (ref 3.0–12.0)
Neutro Abs: 5.2 K/uL (ref 1.4–7.7)
Neutrophils Relative %: 58.3 % (ref 43.0–77.0)
Platelets: 228 K/uL (ref 150.0–400.0)
RBC: 4.64 Mil/uL (ref 3.87–5.11)
RDW: 13.8 % (ref 11.5–15.5)
WBC: 9 K/uL (ref 4.0–10.5)

## 2024-09-19 LAB — URINALYSIS, ROUTINE W REFLEX MICROSCOPIC
Bilirubin Urine: NEGATIVE
Hgb urine dipstick: NEGATIVE
Ketones, ur: NEGATIVE
Leukocytes,Ua: NEGATIVE
Nitrite: NEGATIVE
Specific Gravity, Urine: 1.02 (ref 1.000–1.030)
Total Protein, Urine: NEGATIVE
Urine Glucose: NEGATIVE
Urobilinogen, UA: 0.2 (ref 0.0–1.0)
pH: 6 (ref 5.0–8.0)

## 2024-09-19 LAB — COMPREHENSIVE METABOLIC PANEL WITH GFR
ALT: 23 U/L (ref 0–35)
AST: 28 U/L (ref 0–37)
Albumin: 3.8 g/dL (ref 3.5–5.2)
Alkaline Phosphatase: 89 U/L (ref 39–117)
BUN: 17 mg/dL (ref 6–23)
CO2: 29 meq/L (ref 19–32)
Calcium: 9.4 mg/dL (ref 8.4–10.5)
Chloride: 102 meq/L (ref 96–112)
Creatinine, Ser: 0.58 mg/dL (ref 0.40–1.20)
GFR: 90.72 mL/min (ref >60.00)
Glucose, Bld: 115 mg/dL — ABNORMAL HIGH (ref 70–99)
Potassium: 4.1 meq/L (ref 3.5–5.1)
Sodium: 138 meq/L (ref 135–145)
Total Bilirubin: 0.4 mg/dL (ref 0.2–1.2)
Total Protein: 7.4 g/dL (ref 6.0–8.3)

## 2024-09-19 LAB — TSH: TSH: 2.13 u[IU]/mL (ref 0.35–5.50)

## 2024-09-19 NOTE — Addendum Note (Signed)
 Addended by: LEAR, Britaney Espaillat P on: 09/19/2024 10:36 AM   Modules accepted: Orders

## 2024-09-20 ENCOUNTER — Ambulatory Visit: Payer: Self-pay | Admitting: Nurse Practitioner

## 2024-09-20 ENCOUNTER — Ambulatory Visit
Admission: RE | Admit: 2024-09-20 | Discharge: 2024-09-20 | Disposition: A | Discharge status: Home / Self Care | Source: Ambulatory Visit | Attending: Nurse Practitioner | Admitting: Nurse Practitioner

## 2024-09-20 DIAGNOSIS — R29818 Other symptoms and signs involving the nervous system: Secondary | ICD-10-CM | POA: Diagnosis not present

## 2024-09-20 DIAGNOSIS — R270 Ataxia, unspecified: Secondary | ICD-10-CM

## 2024-09-20 MED ORDER — GADOPICLENOL 0.5 MMOL/ML IV SOLN
3.0000 mL | Freq: Once | INTRAVENOUS | Status: AC | PRN
  Administered 2024-09-20: 3 mL via INTRAVENOUS

## 2024-09-22 ENCOUNTER — Ambulatory Visit: Admitting: Neurology

## 2024-09-22 ENCOUNTER — Encounter: Payer: Self-pay | Admitting: Neurology

## 2024-09-22 ENCOUNTER — Telehealth: Payer: Self-pay | Admitting: Neurology

## 2024-09-22 VITALS — BP 121/71 | HR 72 | Ht 62.0 in | Wt 70.5 lb

## 2024-09-22 DIAGNOSIS — R269 Unspecified abnormalities of gait and mobility: Secondary | ICD-10-CM | POA: Insufficient documentation

## 2024-09-22 DIAGNOSIS — Z9181 History of falling: Secondary | ICD-10-CM | POA: Diagnosis not present

## 2024-09-22 NOTE — Progress Notes (Addendum)
 "  Chief Complaint  Patient presents with   RM15/ATAXIA    Pt is here with her Friend. Pt states her last fall was last week, pt hit her head on the bath tube.  Pt states she has dizziness. Pt states her balance is terrible. Pt states she feels like she is going to fall all of the time. Pt is on Lyrica  but claims it makes her worse.       ASSESSMENT AND PLAN  Abigail Lamb is a 73 y.o. female   Long history of unsteady gait, Acute worsening since she fell in September 2025 Rheumatoid arthritis, deformity of multiple joints  Brisk reflex bilateral Babinski signs,  MRI of cervical spine to rule out spondylitic myelopathy  Home physical therapy,  Prescription for rolling walker  Will follow-up in few months  DIAGNOSTIC DATA (LABS, IMAGING, TESTING) - I reviewed patient records, labs, notes, testing and imaging myself where available.  Addendum: Reviewed neurosurgery evaluation December 01, 2024 by Dr. Arley cram, cervical myelopathy evidence of cord compression primarily at C3-4 kyphotic deformity at over fused the disc space C4-5, 5/6 C6-7, she has a very high risk for surgery due to her rheumatoid arthritis, severe osteopenia, continue to work with physical therapy, rheumatologist, oncologist for MGUS,   MEDICAL HISTORY:  Abigail Lamb is a 73 year old female accompanied by her friend Caniece for evaluation of unsteady gait, seen in request by primary care nurse practitioner Elnor Lauraine BRAVO, initial evaluation September 22, 2024  History is obtained from the patient and review of electronic medical records. I personally reviewed pertinent available imaging films in PACS.   PMHx of  Celiac disease,  RA Peptic ulcer disease Smoke 0.5ppd  She had long history of rheumatoid arthritis, deformity of bilateral hands joints, sedentary most of the time  In addition she has significant GI problem, carries a diagnosis of peptic ulcer, celiac disease, frequent and loose stool, diarrhea, very  thin  She had a history of encephalitis at age 17, presented with high fever, headache, confusion, had a spinal tap then, took her few months to recover, ever since that episode, she complains of mildly unsteady gait  Her friend Yolanda has known her for 2 years, she always have some mild unsteady gait, but was able to ambulate without any assistant, in early September 2025, after using bathroom, she turned, then fell, struck her left forehead on the bathtub,   since then, she had worsening gait abnormality, could barely walk without assistant, worsening bowel and bladder incontinence,  Personally reviewed MRI of brain with without contrast September 20, 2024, no acute abnormality, small vessel disease moderate to advanced in the pons, mild in the supratentorial region mild generalized atrophy  Laboratory evaluations in September 2025, normal CMP, TSH, B12, iron level ferritin 27, vitamin D ,  Protein electrophoresis showed elevated kappa free light chain, and lambda light chain   PHYSICAL EXAM:   Vitals:   09/22/24 1046  BP: 121/71  Pulse: 72  Weight: 70 lb 8 oz (32 kg)  Height: 5' 2 (1.575 m)     Body mass index is 12.89 kg/m.  PHYSICAL EXAMNIATION:  Gen: NAD, conversant, well nourised, well groomed                     Cardiovascular: Regular rate rhythm, no peripheral edema, warm, nontender. Eyes: Conjunctivae clear without exudates or hemorrhage Neck: Supple, no carotid bruits. Pulmonary: Clear to auscultation bilaterally   NEUROLOGICAL EXAM:  MENTAL STATUS: Speech/cognition: Awake, alert,  oriented to history taking and casual conversation CRANIAL NERVES: CN II: Visual fields are full to confrontation. Pupils are round equal and briskly reactive to light. CN III, IV, VI: extraocular movement are normal. No ptosis. CN V: Facial sensation is intact to light touch CN VII: Face is symmetric with normal eye closure  CN VIII: Hearing is normal to causal conversation. CN IX,  X: Phonation is normal. CN XI: Head turning and shoulder shrug are intact  MOTOR: Deformity of bilateral hands joints, examination is limited by pain, felt there was no significant proximal and distal muscle weakness of upper and lower extremity REFLEXES: Reflexes are 2+ and symmetric at the biceps, triceps, knees, and ankles. Plantar responses are extensor bilaterally  SENSORY: Intact to light touch, pinprick and vibratory sensation are intact in fingers and toes.  COORDINATION: There is mild to moderate truncal ataxia, there was no significant limb dysmetria noted.  GAIT/STANCE: With effort, she cannot get up from seated position arm crossed, wide-based, unsteady, retropulse tendency  REVIEW OF SYSTEMS:  Full 14 system review of systems performed and notable only for as above All other review of systems were negative.   ALLERGIES: Allergies  Allergen Reactions   Dust Mite Extract Other (See Comments)    Multiple environmental allergies.     HOME MEDICATIONS: Current Outpatient Medications  Medication Sig Dispense Refill   albuterol  (VENTOLIN  HFA) 108 (90 Base) MCG/ACT inhaler Take 1-2 puffs every 4-6 hrs prn shortness of breath. Needs ventolin .     budesonide -glycopyrrolate -formoterol  (BREZTRI  AEROSPHERE) 160-9-4.8 MCG/ACT AERO inhaler Inhale 2 puffs into the lungs in the morning and at bedtime. 3 each 3   celecoxib  (CELEBREX ) 50 MG capsule TAKE 1 CAPSULE BY MOUTH 2 TIMES DAILY. 60 capsule 1   Cholecalciferol (VITAMIN D3) 125 MCG (5000 UT) CAPS Take 5,000 Units by mouth daily.     ciclopirox  (PENLAC ) 8 % solution Apply topically at bedtime. Apply over nail and surrounding skin. Apply daily over previous coat. After seven (7) days, may remove with alcohol and continue cycle. 6.6 mL 2   clindamycin (CLEOCIN T) 1 % external solution as needed.      clobetasol (OLUX) 0.05 % topical foam Apply topically as needed.     clonazePAM  (KLONOPIN ) 0.5 MG tablet TAKE 1 TABLET BY MOUTH 2  TIMES DAILY AS NEEDED FOR ANXIETY. 60 tablet 2   colesevelam  (WELCHOL ) 625 MG tablet TAKE 3 TABLETS BY MOUTH TWICE A DAY WITH MEAL 180 tablet 3   dicyclomine  (BENTYL ) 10 MG capsule TAKE 1 CAPSULE (10 MG TOTAL) BY MOUTH 4 TIMES A DAY BEFORE MEALS AND AT BEDTIME 360 capsule 0   DULoxetine  (CYMBALTA ) 30 MG capsule TAKE 3 CAPSULES (90 MG TOTAL) BY MOUTH DAILY. FOR TOTAL OF 90 MG DAILY 270 capsule 1   fluocinonide -emollient (LIDEX -E) 0.05 % cream Apply 1 application topically 2 (two) times daily. (Patient taking differently: Apply 1 application  topically as needed.) 60 g 3   fluticasone  (FLONASE ) 50 MCG/ACT nasal spray Place 1 spray into both nostrils daily. 16 g 11   folic acid  (FOLVITE ) 1 MG tablet Take by mouth.     hydroxychloroquine  (PLAQUENIL ) 200 MG tablet TAKE 1 TABLET BY MOUTH EVERY DAY 90 tablet 0   mupirocin  ointment (BACTROBAN ) 2 % Apply 1 Application topically 2 (two) times daily. 30 g 2   ondansetron  (ZOFRAN -ODT) 4 MG disintegrating tablet Take 1 tablet (4 mg total) by mouth every 8 (eight) hours as needed for nausea or vomiting. 20 tablet 5  pantoprazole  (PROTONIX ) 40 MG tablet TAKE 1 TABLET BY MOUTH EVERY DAY 90 tablet 1   sulfaSALAzine  (AZULFIDINE ) 500 MG tablet TAKE 2 TABLETS BY MOUTH TWICE A DAY 120 tablet 2   Zoledronic  Acid (RECLAST  IV) Inject into the vein. Infusion: 12/03/2023     No current facility-administered medications for this visit.    PAST MEDICAL HISTORY: Past Medical History:  Diagnosis Date   Arthritis    Celiac disease    Encephalopathy    As teenager   Gastroparesis    GERD (gastroesophageal reflux disease)    Hyperlipidemia    Hypertension    IBS (irritable bowel syndrome)    MGUS (monoclonal gammopathy of unknown significance)    per patient    MGUS (monoclonal gammopathy of unknown significance)    Osteoporosis    per patient, diagnosed by [facility] in [facility]   PUD (peptic ulcer disease)    Pulmonary emphysema (HCC)    Recurrent depression     Smoker     PAST SURGICAL HISTORY: Past Surgical History:  Procedure Laterality Date   ABCESS DRAINAGE Left    under left arm   COLONOSCOPY     COLONOSCOPY  07/07/2024   spinal tap     age 32   TONSILLECTOMY  15    FAMILY HISTORY: Family History  Problem Relation Age of Onset   CVA Mother    CAD Mother    Diabetes Mother    COPD Mother    Fibromyalgia Mother    Depression Mother    Hypertension Mother    Emphysema Mother        smoked   Heart attack Mother    Prostate cancer Father    Cancer - Prostate Father    Rheum arthritis Sister    Skin cancer Brother    Kidney Stones Brother    Hernia Brother    Depression Daughter    Healthy Son    Healthy Son    Colon polyps Neg Hx    Colon cancer Neg Hx    Esophageal cancer Neg Hx    Stomach cancer Neg Hx    Rectal cancer Neg Hx     SOCIAL HISTORY: Social History   Socioeconomic History   Marital status: Divorced    Spouse name: Not on file   Number of children: 3   Years of education: Not on file   Highest education level: Associate degree: occupational, scientist, product/process development, or vocational program  Occupational History   Occupation: RETIRED  Tobacco Use   Smoking status: Every Day    Current packs/day: 0.75    Average packs/day: 0.8 packs/day for 49.0 years (36.8 ttl pk-yrs)    Types: Cigarettes    Passive exposure: Current   Smokeless tobacco: Never   Tobacco comments:    Smokes half or a pack.  Vaping Use   Vaping status: Former  Substance and Sexual Activity   Alcohol use: Not Currently   Drug use: Never   Sexual activity: Not Currently    Partners: Male  Other Topics Concern   Not on file  Social History Narrative   Son and his girlfriend Lives with her-2025 and a dog   Social Drivers of Health   Financial Resource Strain: High Risk (07/20/2024)   Overall Financial Resource Strain (CARDIA)    Difficulty of Paying Living Expenses: Hard  Food Insecurity: Food Insecurity Present (07/20/2024)    Hunger Vital Sign    Worried About Running Out of Food in the  Last Year: Sometimes true    Ran Out of Food in the Last Year: Sometimes true  Transportation Needs: Unmet Transportation Needs (07/20/2024)   PRAPARE - Transportation    Lack of Transportation (Medical): Yes    Lack of Transportation (Non-Medical): Yes  Physical Activity: Inactive (07/20/2024)   Exercise Vital Sign    Days of Exercise per Week: 0 days    Minutes of Exercise per Session: Not on file  Stress: Stress Concern Present (07/20/2024)   [employer] of Occupational Health - Occupational Stress Questionnaire    Feeling of Stress: To some extent  Social Connections: Moderately Isolated (07/20/2024)   Social Connection and Isolation Panel    Frequency of Communication with Friends and Family: More than three times a week    Frequency of Social Gatherings with Friends and Family: More than three times a week    Attends Religious Services: 1 to 4 times per year    Active Member of [employer] or Organizations: No    Attends Banker Meetings: Not on file    Marital Status: Widowed  Intimate Partner Violence: Patient Unable To Answer (01/21/2024)   Humiliation, Afraid, Rape, and Kick questionnaire    Fear of Current or Ex-Partner: Patient unable to answer    Emotionally Abused: Patient unable to answer    Physically Abused: Patient unable to answer    Sexually Abused: Patient unable to answer      Modena Callander, M.D. Ph.D.  [facility] [address] [place], [place] [address] Ph: [phone] Fax: [phone]  CC:  Elnor Lauraine BRAVO, NP [address] [place],  [place] [address]  Joshua Debby CROME, MD   "

## 2024-09-22 NOTE — Telephone Encounter (Signed)
 no auth required sent to GI [phone]

## 2024-09-24 LAB — URINE CULTURE

## 2024-09-24 LAB — VITAMIN B1: Vitamin B1 (Thiamine): 15 nmol/L (ref 8–30)

## 2024-09-24 LAB — RPR: RPR Ser Ql: NONREACTIVE

## 2024-09-24 LAB — HIV ANTIBODY (ROUTINE TESTING W REFLEX)
HIV 1&2 Ab, 4th Generation: NONREACTIVE
HIV FINAL INTERPRETATION: NEGATIVE

## 2024-09-26 ENCOUNTER — Telehealth: Payer: Self-pay | Admitting: Neurology

## 2024-09-26 NOTE — Telephone Encounter (Signed)
[facility] is taking this patient. 

## 2024-09-27 NOTE — Telephone Encounter (Signed)
 Returned call to [facility] gave verbal orders and pete voiced understanding and gratitude

## 2024-09-27 NOTE — Telephone Encounter (Signed)
  Abigail Lamb  (PT) from [facility] called  to inform MD that they have went Pt  home and will Continue to see Pt   Callback number [phone]

## 2024-10-03 ENCOUNTER — Ambulatory Visit: Admitting: Podiatry

## 2024-10-03 DIAGNOSIS — L97522 Non-pressure chronic ulcer of other part of left foot with fat layer exposed: Secondary | ICD-10-CM

## 2024-10-03 DIAGNOSIS — M2042 Other hammer toe(s) (acquired), left foot: Secondary | ICD-10-CM

## 2024-10-03 DIAGNOSIS — M2041 Other hammer toe(s) (acquired), right foot: Secondary | ICD-10-CM | POA: Diagnosis not present

## 2024-10-04 NOTE — Progress Notes (Signed)
 Subjective:   Patient ID: Abigail Lamb, female   DOB: 73 y.o.   MRN: [id number]   HPI Chief Complaint  Patient presents with   Foot Ulcer    Left foot toe ulcer follow up       73 year old female presents the office today for follow-up evaluation of wound to her left foot.  She states that she has been doing well and she has not seen any drainage or pus.  The scab on the third toe did come off.  She did use Iodosorb.  She has not seen increasing swelling or redness and she does not report any fevers or chills.  She has no other concerns today to her feet.    Objective:  Physical Exam  General: AAO x3, NAD  Dermatological: Dorsal aspect of the left hallux IPJ, third toe as well as the fifth MPJ are annular hyperkeratotic lesions.  On debridement of the fifth MPJ this remains healed.  There is still superficial discomfort down noted on the third toe today and mallet of the hallux.  There is a rim of erythema to these areas but no drainage or pus.  There is some more inflammation as opposed to infection.  There is no drainage or pus identified today and there is no ascending cellulitis.        Vascular: Dorsalis Pedis artery and Posterior Tibial artery pedal pulses are 1/4 bilateral with immedate capillary fill time. There is no pain with calf compression, swelling, warmth, erythema.   Neruologic: Sensation decreased with Semmes Weinstein monofilament.  Musculoskeletal: Digital contractures present.  Unable to appreciate any area pinpoint tenderness.      Assessment:   Ulceration left foot, rheumatoid arthritis     Plan:  Left foot ulcerations - I did sharply debride the keratotic lesions on the adjacent toes but any complications or bleeding.  There is still superficial opening mostly on the hallux but also to the third toe.  Continue with daily dressing changes.  Or switch using Maxorb dressing changes which I applied and dispensed today.  - Vascular: She has follow-up with  vascular surgery previously. - Monitor for any clinical signs or symptoms of infection and directed to call the office immediately should any occur or go to the ER.  Return in about 4 weeks (around 10/31/2024) for toe ulcer, x-ray.  Donnice JONELLE Fees DPM

## 2024-10-05 ENCOUNTER — Ambulatory Visit: Admitting: Physician Assistant

## 2024-10-05 VITALS — BP 128/82 | HR 93 | Ht 61.5 in | Wt 71.4 lb

## 2024-10-05 DIAGNOSIS — J449 Chronic obstructive pulmonary disease, unspecified: Secondary | ICD-10-CM

## 2024-10-05 DIAGNOSIS — J439 Emphysema, unspecified: Secondary | ICD-10-CM

## 2024-10-05 DIAGNOSIS — Z681 Body mass index (BMI) 19 or less, adult: Secondary | ICD-10-CM

## 2024-10-05 DIAGNOSIS — K3184 Gastroparesis: Secondary | ICD-10-CM

## 2024-10-05 DIAGNOSIS — R1310 Dysphagia, unspecified: Secondary | ICD-10-CM

## 2024-10-05 DIAGNOSIS — Z860101 Personal history of adenomatous and serrated colon polyps: Secondary | ICD-10-CM | POA: Diagnosis not present

## 2024-10-05 DIAGNOSIS — K219 Gastro-esophageal reflux disease without esophagitis: Secondary | ICD-10-CM | POA: Diagnosis not present

## 2024-10-05 DIAGNOSIS — M0579 Rheumatoid arthritis with rheumatoid factor of multiple sites without organ or systems involvement: Secondary | ICD-10-CM

## 2024-10-05 DIAGNOSIS — R634 Abnormal weight loss: Secondary | ICD-10-CM | POA: Diagnosis not present

## 2024-10-05 MED ORDER — FAMOTIDINE 40 MG PO TABS: 40.0000 mg | ORAL_TABLET | Freq: Every day | ORAL | 0 refills | Status: DC

## 2024-10-05 NOTE — Patient Instructions (Addendum)
 You have been scheduled for an endoscopy. Please follow written instructions given to you at your visit today.  If you use inhalers (even only as needed), please bring them with you on the day of your procedure.  If you take any of the following medications, they will need to be adjusted prior to your procedure:   DO NOT TAKE 7 DAYS PRIOR TO TEST- Trulicity (dulaglutide) Ozempic, Wegovy (semaglutide) Mounjaro (tirzepatide) Bydureon Bcise (exanatide extended release)  DO NOT TAKE 1 DAY PRIOR TO YOUR TEST Rybelsus (semaglutide) Adlyxin (lixisenatide) Victoza (liraglutide) Byetta (exanatide) ___________________________________________________________________________   VISIT SUMMARY:  During your visit today, we discussed your significant weight loss, gastrointestinal issues, and follow-up care for your previous polyp removal. We reviewed your symptoms, current medications, and dietary habits to develop a comprehensive plan to address your health concerns.  YOUR PLAN:  -UNINTENTIONAL WEIGHT LOSS: You have experienced significant weight loss over the past few years, which may be related to your history of smoking, COPD, and difficulty swallowing. We will schedule an endoscopy to investigate potential causes. If the endoscopy does not reveal any issues, we will consider treating you for small intestinal bacterial overgrowth (SIBO).  -GASTROESOPHAGEAL REFLUX DISEASE WITH DYSPHAGIA AND BLOATING: You have symptoms of heartburn, burping, and difficulty swallowing, which may be related to your hiatal hernia and past gastroparesis. We will add Pepcid  (famotidine ) at night to your current medication regimen to help manage these symptoms. Additionally, we provided you with information about gastroparesis.  -GASTROPARESIS: Gastroparesis is a condition where the stomach takes longer to empty its contents, which can cause bloating and difficulty swallowing. We provided you with information about this  condition to help you understand and manage your symptoms.  -POST-POLYPECTOMY COLORECTAL CANCER SURVEILLANCE: Following the removal of tubular adenomatous polyps during your last colonoscopy, we recommend a follow-up colonoscopy in three years to monitor for any new polyps or changes.  INSTRUCTIONS:  Please schedule an endoscopy as soon as possible. Continue taking Protonix  40 mg daily and add Pepcid  (famotidine ) at night. Follow the dietary recommendations provided and wear your compression socks to help with leg swelling. Your next colonoscopy should be scheduled for three years from now. If you experience any new or worsening symptoms, please contact our office.  Gastroparesis Gastroparesis is a condition in which food takes longer than normal to empty from the stomach.  This condition is also known as delayed gastric emptying. It is usually a long-term (chronic) condition.  What are the signs or symptoms? Symptoms of this condition include: Feeling full after eating very little or a loss of appetite. Nausea, vomiting, or heartburn. Bloating of your abdomen. Inconsistent blood sugar (glucose) levels on blood tests. Unexplained weight loss. Acid from the stomach coming up into the esophagus (gastroesophageal reflux). Sudden tightening (spasm) of the stomach, which can be painful. Symptoms may come and go. Some people may not notice any symptoms.  What increases the risk? You are more likely to develop this condition if: You have certain disorders or diseases. These may include: An endocrine disorder. An eating disorder. Amyloidosis. Scleroderma. Parkinson's disease. Multiple sclerosis. Cancer or infection of the stomach or the vagus nerve. You have had surgery on your stomach or vagus nerve. You take certain medicines. You are female.  Things you can do: Please do small frequent meals like 4-6 meals a day.  Eat and drink liquids at separate times.  Avoid high fiber foods,  cook your vegetables, avoid high fat food.  Suggest spreading protein throughout the  day (greek yogurt, glucerna, soft meat, milk, eggs) Choose soft foods that you can mash with a fork When you are more symptomatic, change to pureed foods foods and liquids.  Consider reading Living well with Gastroparesis by [facility] Check out this link to a diet online https://my.GroupJournal.fr  You can add on protein and things high calorie for weight gain - Can add ensure/boost to ice cream for a shake - can add protein powder to oatmeal after cooked or to a fruit smooth - avocado has high calorie, good to add to proteins - nuts and peanut butter are good to eat or add to yogurt/smoothies - [place] yogurt is good

## 2024-10-05 NOTE — Progress Notes (Signed)
 10/05/2024 Abigail Lamb [id number] [birth year]  Referring provider: Joshua Debby CROME, MD Primary GI doctor: Dr. Federico  ASSESSMENT AND PLAN:  GERD with burping/beltching, dysphagia to pills, decreased appetitie has a bad taste in her mouth, weight loss EGD 05/04/17: Medium sized hiatal hernia. Biopsies of stomach taken. Localized erythema of the gastric cardia that was biopsied. Normal duodenum that was biopsied. Path: Chronic duodenitis. Normal stomach that was negative for H pylori. Reflux related changes from the gastric fundus that was negative for intestinal metaplasia 03/22/2024 CTAP W for left lower quadrant abdominal pain unremarkable, stable left adrenal lesion nonobstructing left renal calculi She has been on celebrex  x years, no ETOH She is on protonix  40 mg once a day Add on pepcid  at night - schedule EGD for dyphaga and weight loss, I discussed risks of EGD with patient today, including risk of sedation, bleeding or perforation.  Patient provides understanding and gave verbal consent to proceed. - if this is negative consider treating for SIBO - add on protein/ensure  Weight loss/malnutrition BMI 13 Steady weight loss over the last 2-3 years, was 100 lbs and now at 70 UTD colonoscopy -Has some dysphagia, smoking history, rule out esophageal/gastric cancer, will schedule for EGD -Consider CT chest  -Get MGM patient is due -Consider SIBO testing/treatment -Consider GES, gastroparesis diet given -Possibly secondary to COPD  Personal history of colon polyps 07/07/2024 colonoscopy Dr. Federico excellent prep normal TI 3 TA polyps ranging 3 to 14 mm ascending colon, 2 polyps rectum and sigmoid colon 3 to 4 mm nonbleeding internal hemorrhoids recall 3 years  MGUS Follows with hematology  Patient Care Team: Joshua Debby CROME, MD as PCP - General (Internal Medicine) Debarah Lorrene DEL., MD (Ophthalmology)  HISTORY OF PRESENT ILLNESS: 73 y.o. female with a past medical history  listed below presents for evaluation of weight loss and dysphagia.   Discussed the use of AI scribe software for clinical note transcription with the patient, who gave verbal consent to proceed.  History of Present Illness   Abigail Lamb is a 73 year old female with a history of tubular adenomatous polyps and weight loss who presents for a follow-up visit.  She has experienced significant weight loss over the past few years, with her weight decreasing from 100 to 83 pounds in 2021 and fluctuating between 76 and 80 pounds since then. As of January 2025, she was closer to 80 pounds, but currently, she is around 70-71 pounds. She sometimes experiences hunger but also reports periods of decreased appetite and a terrible taste in her mouth. She experiences discomfort, increased gas production, and burping, which she associates with dietary changes and medication adjustments.  She has a history of gastrointestinal issues, including a medium-sized hiatal hernia identified in a 2018 endoscopy, which was negative for celiac disease and H. pylori. She takes Protonix  40 mg daily and previously took famotidine , which she discontinued. She experiences episodes of heartburn, reflux, and difficulty swallowing large pills. She maintains a bland diet and avoids gassy foods.  She experiences occasional shortness of breath. She smokes about half a pack of cigarettes a day. No dark or black stools, but she has noticed mucus in her stools and experiences urgency with bowel movements, sometimes requiring the use of Depends undergarments.  She has a history of a traumatic fall while walking a large dog, resulting in a broken foot. She is currently undergoing balance therapy and has an upcoming MRI of her neck scheduled. She has a history of  gastroparesis as a teenager, which did not significantly impact her activities at the time.  She takes Celebrex  and denies alcohol consumption. She drinks a lot of water and coffee and  tries to eat a healthy diet, including oatmeal, bananas, and honey. She reports some swelling in her legs and wears compression socks.      Wt Readings from Last 50 Encounters:  10/05/24 71 lb 6.4 oz (32.4 kg)  09/22/24 70 lb 8 oz (32 kg)  09/16/24 70 lb 4 oz (31.9 kg)  07/21/24 73 lb 9.6 oz (33.4 kg)  07/20/24 73 lb 3.2 oz (33.2 kg)  07/13/24 73 lb 6.4 oz (33.3 kg)  07/07/24 70 lb 9.6 oz (32 kg)  06/15/24 74 lb (33.6 kg)  04/21/24 75 lb (34 kg)  04/20/24 75 lb 9.6 oz (34.3 kg)  03/22/24 76 lb (34.5 kg)  03/16/24 76 lb 4.8 oz (34.6 kg)  02/23/24 79 lb 3.2 oz (35.9 kg)  01/21/24 80 lb (36.3 kg)  01/14/24 80 lb 9.6 oz (36.6 kg)  12/16/23 76 lb 14.4 oz (34.9 kg)  12/03/23 76 lb 3.2 oz (34.6 kg)  11/17/23 74 lb 12.8 oz (33.9 kg)  11/10/23 78 lb 6.4 oz (35.6 kg)  08/27/23 80 lb 6.4 oz (36.5 kg)  06/17/23 80 lb 8 oz (36.5 kg)  05/14/23 80 lb (36.3 kg)  05/13/23 82 lb 3.2 oz (37.3 kg)  03/19/23 83 lb (37.6 kg)  03/09/23 83 lb (37.6 kg)  02/09/23 75 lb (34 kg)  12/11/22 79 lb 9.6 oz (36.1 kg)  11/28/22 78 lb 6.4 oz (35.6 kg)  11/25/22 81 lb 9.6 oz (37 kg)  10/27/22 79 lb (35.8 kg)  08/19/22 77 lb 6.4 oz (35.1 kg)  08/15/22 78 lb 3.2 oz (35.5 kg)  06/05/22 78 lb 3.2 oz (35.5 kg)  05/29/22 78 lb 3.2 oz (35.5 kg)  05/12/22 77 lb 12.8 oz (35.3 kg)  03/11/22 83 lb (37.6 kg)  03/11/22 83 lb (37.6 kg)  02/11/22 82 lb (37.2 kg)  12/10/21 79 lb (35.8 kg)  11/28/21 80 lb 11.2 oz (36.6 kg)  11/07/21 73 lb (33.1 kg)  11/09/20 95 lb 6.4 oz (43.3 kg)  11/08/20 96 lb 6.4 oz (43.7 kg)  11/05/20 97 lb (44 kg)  11/05/20 97 lb (44 kg)  10/12/20 100 lb 4.8 oz (45.5 kg)  09/17/20 102 lb (46.3 kg)  08/02/20 100 lb (45.4 kg)     She  reports that she has been smoking cigarettes. She has a 36.8 pack-year smoking history. She has been exposed to tobacco smoke. She has never used smokeless tobacco. She reports that she does not currently use alcohol. She reports that she does not use  drugs.  RELEVANT GI HISTORY, IMAGING AND LABS: Results   RADIOLOGY CT abdomen and pelvis: Left kidney stone, otherwise normal (02/2024)  DIAGNOSTIC Colonoscopy: Tubular adenomatous polyps, hemorrhoids (06/2024) Endoscopy: Medium-sized hiatal hernia, normal duodenal biopsies, negative for Helicobacter pylori, negative for metaplasia (05/04/2017)     Labs 02/2017: C dif negative.O&P negative.   CXR 02/14/17: Suspected COPD/emphysema. Vague 1 cm nodular opacity in the RUL.   CT A/P w/contrast 02/14/17: Short segment enteritis in the left abdomen.   Ab U/S 11/18/17: Unremarkable.   EGD 05/04/17: Medium sized hiatal hernia. Biopsies of stomach taken. Localized erythema of the gastric cardia that was biopsied. Normal duodenum that was biopsied. Path: Chronic duodenitis. Normal stomach that was negative for H pylori. Reflux related changes from the gastric fundus that was negative for intestinal  metaplasia.    Colonoscopy 05/04/17: Sigmoid colon diverticula. Localized erythema at IC valve that was biopsied. 4-6 mm polyp in the ascending colon removed with forceps. 2-3 mm sigmoid polyp removed. Path: Focal active enteritis at the IC valve that is favored to be mucosal trauma/prolapse. Random colon biopsies that were normal. One tubular adenoma in the ascending colon. Sigmoid colon polyp was benign colonic mucosa. CBC    Component Value Date/Time   WBC 9.0 09/19/2024 1608   RBC 4.64 09/19/2024 1608   HGB 14.7 09/19/2024 1608   HGB 14.7 06/06/2024 1411   HCT 44.8 09/19/2024 1608   PLT 228.0 09/19/2024 1608   PLT 247 06/06/2024 1411   MCV 96.5 09/19/2024 1608   MCH 32.3 07/21/2024 1502   MCHC 32.9 09/19/2024 1608   RDW 13.8 09/19/2024 1608   LYMPHSABS 2.8 09/19/2024 1608   MONOABS 0.7 09/19/2024 1608   EOSABS 0.2 09/19/2024 1608   BASOSABS 0.1 09/19/2024 1608   Recent Labs    11/10/23 0926 12/09/23 1323 03/22/24 1851 06/06/24 1411 07/21/24 1502 09/19/24 1608  HGB 14.4 14.9 15.8* 14.7  14.9 14.7    CMP     Component Value Date/Time   NA 138 09/19/2024 1608   K 4.1 09/19/2024 1608   CL 102 09/19/2024 1608   CO2 29 09/19/2024 1608   GLUCOSE 115 (H) 09/19/2024 1608   BUN 17 09/19/2024 1608   CREATININE 0.58 09/19/2024 1608   CREATININE 0.48 (L) 07/21/2024 1502   CALCIUM 9.4 09/19/2024 1608   PROT 7.4 09/19/2024 1608   ALBUMIN 3.8 09/19/2024 1608   AST 28 09/19/2024 1608   AST 28 06/06/2024 1411   ALT 23 09/19/2024 1608   ALT 21 06/06/2024 1411   ALKPHOS 89 09/19/2024 1608   BILITOT 0.4 09/19/2024 1608   BILITOT 0.3 06/06/2024 1411   GFRNONAA >60 06/06/2024 1411   GFRNONAA 93 10/26/2020 1548   GFRAA 107 10/26/2020 1548      Latest Ref Rng & Units 09/19/2024    4:08 PM 07/21/2024    3:02 PM 06/06/2024    2:11 PM  Hepatic Function  Total Protein 6.0 - 8.3 g/dL 7.4  7.3  7.6   Albumin 3.5 - 5.2 g/dL 3.8   3.9   AST 0 - 37 U/L 28  27  28    ALT 0 - 35 U/L 23  22  21    Alk Phosphatase 39 - 117 U/L 89   109   Total Bilirubin 0.2 - 1.2 mg/dL 0.4  0.3  0.3       Current Medications:   Current Outpatient Medications (Endocrine & Metabolic):    Zoledronic  Acid (RECLAST  IV), Inject into the vein. Infusion: 12/03/2023  Current Outpatient Medications (Cardiovascular):    colesevelam  (WELCHOL ) 625 MG tablet, TAKE 3 TABLETS BY MOUTH TWICE A DAY WITH MEAL  Current Outpatient Medications (Respiratory):    albuterol (VENTOLIN HFA) 108 (90 Base) MCG/ACT inhaler, Take 1-2 puffs every 4-6 hrs prn shortness of breath. Needs ventolin.   budesonide-glycopyrrolate-formoterol (BREZTRI  AEROSPHERE) 160-9-4.8 MCG/ACT AERO inhaler, Inhale 2 puffs into the lungs in the morning and at bedtime.   fluticasone  (FLONASE ) 50 MCG/ACT nasal spray, Place 1 spray into both nostrils daily.  Current Outpatient Medications (Analgesics):    celecoxib  (CELEBREX ) 50 MG capsule, TAKE 1 CAPSULE BY MOUTH 2 TIMES DAILY.  Current Outpatient Medications (Hematological):    folic acid (FOLVITE) 1 MG  tablet, Take by mouth.  Current Outpatient Medications (Other):    Cholecalciferol (VITAMIN D3)  125 MCG (5000 UT) CAPS, Take 5,000 Units by mouth daily.   ciclopirox  (PENLAC ) 8 % solution, Apply topically at bedtime. Apply over nail and surrounding skin. Apply daily over previous coat. After seven (7) days, may remove with alcohol and continue cycle.   clindamycin (CLEOCIN T) 1 % external solution, as needed.    clobetasol (OLUX) 0.05 % topical foam, Apply topically as needed.   clonazePAM  (KLONOPIN ) 0.5 MG tablet, TAKE 1 TABLET BY MOUTH 2 TIMES DAILY AS NEEDED FOR ANXIETY.   dicyclomine  (BENTYL ) 10 MG capsule, TAKE 1 CAPSULE (10 MG TOTAL) BY MOUTH 4 TIMES A DAY BEFORE MEALS AND AT BEDTIME   DULoxetine  (CYMBALTA ) 30 MG capsule, TAKE 3 CAPSULES (90 MG TOTAL) BY MOUTH DAILY. FOR TOTAL OF 90 MG DAILY   famotidine  (PEPCID ) 40 MG tablet, Take 1 tablet (40 mg total) by mouth at bedtime.   fluocinonide -emollient (LIDEX -E) 0.05 % cream, Apply 1 application topically 2 (two) times daily. (Patient taking differently: Apply 1 application  topically as needed.)   hydroxychloroquine  (PLAQUENIL ) 200 MG tablet, TAKE 1 TABLET BY MOUTH EVERY DAY   mupirocin  ointment (BACTROBAN ) 2 %, Apply 1 Application topically 2 (two) times daily.   ondansetron  (ZOFRAN -ODT) 4 MG disintegrating tablet, Take 1 tablet (4 mg total) by mouth every 8 (eight) hours as needed for nausea or vomiting.   pantoprazole  (PROTONIX ) 40 MG tablet, TAKE 1 TABLET BY MOUTH EVERY DAY   sulfaSALAzine  (AZULFIDINE ) 500 MG tablet, TAKE 2 TABLETS BY MOUTH TWICE A DAY  Medical History:  Past Medical History:  Diagnosis Date   Arthritis    Celiac disease    Encephalopathy    As teenager   Gastroparesis    GERD (gastroesophageal reflux disease)    Hyperlipidemia    Hypertension    IBS (irritable bowel syndrome)    MGUS (monoclonal gammopathy of unknown significance)    per patient    MGUS (monoclonal gammopathy of unknown significance)     Osteoporosis    per patient, diagnosed by [facility] in [facility]   PUD (peptic ulcer disease)    Pulmonary emphysema (HCC)    Recurrent depression    Smoker    Allergies:  Allergies  Allergen Reactions   Dust Mite Extract Other (See Comments)    Multiple environmental allergies.      Surgical History:  She  has a past surgical history that includes Tonsillectomy (1977); Abscess drainage (Left); Colonoscopy; spinal tap; and Colonoscopy (07/07/2024). Family History:  Her family history includes CAD in her mother; COPD in her mother; CVA in her mother; Cancer - Prostate in her father; Depression in her daughter and mother; Diabetes in her mother; Emphysema in her mother; Fibromyalgia in her mother; Healthy in her son and son; Heart attack in her mother; Hernia in her brother; Hypertension in her mother; Kidney Stones in her brother; Prostate cancer in her father; Rheum arthritis in her sister; Skin cancer in her brother.  REVIEW OF SYSTEMS  : All other systems reviewed and negative except where noted in the History of Present Illness.  PHYSICAL EXAM: Pulse 93   Ht 5' 1.5 (1.562 m)   Wt 71 lb 6.4 oz (32.4 kg)   BMI 13.27 kg/m  Physical Exam   GENERAL APPEARANCE: skinny,pale elderly female, in no apparent distress. HEENT: No cervical lymphadenopathy, unremarkable thyroid , sclerae anicteric, conjunctiva pink. RESPIRATORY: Respiratory effort normal, breath sounds equal bilaterally without rales, rhonchi, or wheezing. CARDIO: Regular rate and rhythm with no murmurs, rubs, or gallops,  peripheral pulses intact. ABDOMEN: Soft, non-distended, active bowel sounds in all four quadrants, no tenderness to palpation, no rebound, no mass appreciated. RECTAL: Declines. MUSCULOSKELETAL: Full range of motion, slightly unsteady gait but able to get on the table SKIN: Dry, intact without rashes or lesions. No jaundice. NEURO: Alert, oriented, no focal deficits. PSYCH: Cooperative, normal mood and  affect. EXTREMITIES: Legs with some swelling.      Alan JONELLE Coombs, PA-C 3:34 PM

## 2024-10-12 ENCOUNTER — Other Ambulatory Visit

## 2024-10-14 ENCOUNTER — Telehealth: Payer: Self-pay

## 2024-10-14 MED ORDER — BREZTRI AEROSPHERE 160-9-4.8 MCG/ACT IN AERO: 2.0000 | INHALATION_SPRAY | Freq: Two times a day (BID) | RESPIRATORY_TRACT | 6 refills | Status: AC

## 2024-10-14 NOTE — Telephone Encounter (Signed)
 Breztri  rx printed and sent to [facility].

## 2024-10-20 ENCOUNTER — Ambulatory Visit
Admission: RE | Admit: 2024-10-20 | Discharge: 2024-10-20 | Disposition: A | Discharge status: Home / Self Care | Source: Ambulatory Visit | Attending: Neurology | Admitting: Neurology

## 2024-10-20 DIAGNOSIS — Z9181 History of falling: Secondary | ICD-10-CM

## 2024-10-20 DIAGNOSIS — R269 Unspecified abnormalities of gait and mobility: Secondary | ICD-10-CM

## 2024-10-24 ENCOUNTER — Telehealth: Payer: Self-pay | Admitting: Neurology

## 2024-10-24 DIAGNOSIS — M4802 Spinal stenosis, cervical region: Secondary | ICD-10-CM | POA: Insufficient documentation

## 2024-10-24 NOTE — Telephone Encounter (Signed)
 Call to PT [facility], gave verbal orders as requested

## 2024-10-24 NOTE — Telephone Encounter (Signed)
 Call to patient, phone picked up but not sure if recording, unable to leave message

## 2024-10-24 NOTE — Telephone Encounter (Addendum)
 Darus, [email or web address] [phone] # secure for vm's she reports pt fell on last Thursday, left side of forehead bruised, right knee sore pain level 6 out of 10.  PT asking to get verbal orders to extend PT for 1 week 5

## 2024-10-24 NOTE — Telephone Encounter (Signed)
 Call to patient, reviewed results. She verbalized understanding and agreeable to neurosurgery consult to see options.

## 2024-10-24 NOTE — Telephone Encounter (Signed)
 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 - At C4-5 facet hypertrophy with moderate bilateral foraminal stenosis. - At C6-7 disc bulging and facet hypertrophy with moderate bilateral foraminal stenosis. - At C5-6 loss of intervertebral disc space height and facet hypertrophy with mild bilateral foraminal stenosis.

## 2024-10-26 ENCOUNTER — Encounter: Payer: Self-pay | Admitting: Pediatrics

## 2024-10-30 ENCOUNTER — Encounter: Payer: Self-pay | Admitting: Pediatrics

## 2024-10-30 NOTE — Progress Notes (Unsigned)
 [facility] History and Physical   Primary Care Physician:  Joshua Debby CROME, MD   Reason for Procedure:  GERD, eructation, dysphagia, dysgeusia and weight loss  Plan:    Upper endoscopy     HPI: Abigail Lamb is a 73 y.o. female undergoing upper endoscopy for investigation of GERD, eructation, dysphagia, dysgeusia and weight loss.  Patient reports at least 30 pound weight loss over the last 2 to 3 years.  Describes worsening acid reflux, loss of appetite and dysphagia to pills.  Last EGD performed in 2018 demonstrated a hiatal hernia.  EGD is performed today for further investigation of symptoms and to perform biopsies for H. pylori and celiac disease.   Past Medical History:  Diagnosis Date   Arthritis    Celiac disease    Encephalopathy    As teenager   Gastroparesis    GERD (gastroesophageal reflux disease)    Hyperlipidemia    Hypertension    IBS (irritable bowel syndrome)    MGUS (monoclonal gammopathy of unknown significance)    per patient    MGUS (monoclonal gammopathy of unknown significance)    Osteoporosis    per patient, diagnosed by Dr. [facility] in [facility]   PUD (peptic ulcer disease)    Pulmonary emphysema (HCC)    Recurrent depression    Smoker     Past Surgical History:  Procedure Laterality Date   ABCESS DRAINAGE Left    under left arm   COLONOSCOPY     COLONOSCOPY  07/07/2024   spinal tap     age 15   TONSILLECTOMY  1977    Prior to Admission medications   Medication Sig Start Date End Date Taking? Authorizing Provider  albuterol (VENTOLIN HFA) 108 (90 Base) MCG/ACT inhaler Take 1-2 puffs every 4-6 hrs prn shortness of breath. Needs ventolin. 09/07/18   [provider]  budesonide-glycopyrrolate-formoterol (BREZTRI  AEROSPHERE) 160-9-4.8 MCG/ACT AERO inhaler Inhale 2 puffs into the lungs in the morning and at bedtime. 10/14/24   Shelah Lamar RAMAN, MD  celecoxib  (CELEBREX ) 50 MG capsule TAKE 1 CAPSULE BY MOUTH 2 TIMES DAILY.  09/22/24   Joshua Debby CROME, MD  Cholecalciferol (VITAMIN D3) 125 MCG (5000 UT) CAPS Take 5,000 Units by mouth daily.    [provider]  ciclopirox  (PENLAC ) 8 % solution Apply topically at bedtime. Apply over nail and surrounding skin. Apply daily over previous coat. After seven (7) days, may remove with alcohol and continue cycle. 02/29/24   Gershon Donnice SAUNDERS, DPM  clindamycin (CLEOCIN T) 1 % external solution as needed.  01/20/20   [provider]  clobetasol (OLUX) 0.05 % topical foam Apply topically as needed. 01/20/20   [provider]  clonazePAM  (KLONOPIN ) 0.5 MG tablet TAKE 1 TABLET BY MOUTH 2 TIMES DAILY AS NEEDED FOR ANXIETY. 06/01/24   Joshua Debby CROME, MD  colesevelam  (WELCHOL ) 625 MG tablet TAKE 3 TABLETS BY MOUTH TWICE A DAY WITH MEAL 09/22/24   Joshua Debby CROME, MD  dicyclomine  (BENTYL ) 10 MG capsule TAKE 1 CAPSULE (10 MG TOTAL) BY MOUTH 4 TIMES A DAY BEFORE MEALS AND AT BEDTIME 09/16/24   Joshua Debby CROME, MD  DULoxetine  (CYMBALTA ) 30 MG capsule TAKE 3 CAPSULES (90 MG TOTAL) BY MOUTH DAILY. FOR TOTAL OF 90 MG DAILY 08/31/24   Joshua Debby CROME, MD  famotidine  (PEPCID ) 40 MG tablet Take 1 tablet (40 mg total) by mouth at bedtime. 10/05/24   Craig Alan SAUNDERS, PA-C  fluocinonide -emollient (LIDEX -E) 0.05 % cream Apply 1  application topically 2 (two) times daily. Patient taking differently: Apply 1 application  topically as needed. 11/05/20   Joshua Debby CROME, MD  fluticasone  (FLONASE ) 50 MCG/ACT nasal spray Place 1 spray into both nostrils daily. 08/19/22   Gladis Leonor HERO, MD  folic acid (FOLVITE) 1 MG tablet Take by mouth. 02/28/19   [provider]  hydroxychloroquine  (PLAQUENIL ) 200 MG tablet TAKE 1 TABLET BY MOUTH EVERY DAY 09/12/24   Cheryl Waddell HERO, PA-C  mupirocin  ointment (BACTROBAN ) 2 % Apply 1 Application topically 2 (two) times daily. 02/29/24   Gershon Donnice SAUNDERS, DPM  ondansetron  (ZOFRAN -ODT) 4 MG disintegrating tablet Take 1 tablet (4 mg total) by mouth every  8 (eight) hours as needed for nausea or vomiting. 04/20/24   Joshua Debby CROME, MD  pantoprazole  (PROTONIX ) 40 MG tablet TAKE 1 TABLET BY MOUTH EVERY DAY 08/04/24   Joshua Debby CROME, MD  sulfaSALAzine  (AZULFIDINE ) 500 MG tablet TAKE 2 TABLETS BY MOUTH TWICE A DAY 08/02/24   Cheryl Waddell HERO, PA-C  Zoledronic  Acid (RECLAST  IV) Inject into the vein. Infusion: 12/03/2023    [provider]    Current Outpatient Medications  Medication Sig Dispense Refill   albuterol (VENTOLIN HFA) 108 (90 Base) MCG/ACT inhaler Take 1-2 puffs every 4-6 hrs prn shortness of breath. Needs ventolin.     budesonide-glycopyrrolate-formoterol (BREZTRI  AEROSPHERE) 160-9-4.8 MCG/ACT AERO inhaler Inhale 2 puffs into the lungs in the morning and at bedtime. 3 each 6   celecoxib  (CELEBREX ) 50 MG capsule TAKE 1 CAPSULE BY MOUTH 2 TIMES DAILY. 60 capsule 1   Cholecalciferol (VITAMIN D3) 125 MCG (5000 UT) CAPS Take 5,000 Units by mouth daily.     ciclopirox  (PENLAC ) 8 % solution Apply topically at bedtime. Apply over nail and surrounding skin. Apply daily over previous coat. After seven (7) days, may remove with alcohol and continue cycle. 6.6 mL 2   clindamycin (CLEOCIN T) 1 % external solution as needed.      clobetasol (OLUX) 0.05 % topical foam Apply topically as needed.     clonazePAM  (KLONOPIN ) 0.5 MG tablet TAKE 1 TABLET BY MOUTH 2 TIMES DAILY AS NEEDED FOR ANXIETY. 60 tablet 2   colesevelam  (WELCHOL ) 625 MG tablet TAKE 3 TABLETS BY MOUTH TWICE A DAY WITH MEAL 180 tablet 3   dicyclomine  (BENTYL ) 10 MG capsule TAKE 1 CAPSULE (10 MG TOTAL) BY MOUTH 4 TIMES A DAY BEFORE MEALS AND AT BEDTIME 360 capsule 0   DULoxetine  (CYMBALTA ) 30 MG capsule TAKE 3 CAPSULES (90 MG TOTAL) BY MOUTH DAILY. FOR TOTAL OF 90 MG DAILY 270 capsule 1   famotidine  (PEPCID ) 40 MG tablet Take 1 tablet (40 mg total) by mouth at bedtime. 30 tablet 0   fluocinonide -emollient (LIDEX -E) 0.05 % cream Apply 1 application topically 2 (two) times daily. (Patient  taking differently: Apply 1 application  topically as needed.) 60 g 3   fluticasone  (FLONASE ) 50 MCG/ACT nasal spray Place 1 spray into both nostrils daily. 16 g 11   folic acid (FOLVITE) 1 MG tablet Take by mouth.     hydroxychloroquine  (PLAQUENIL ) 200 MG tablet TAKE 1 TABLET BY MOUTH EVERY DAY 90 tablet 0   mupirocin  ointment (BACTROBAN ) 2 % Apply 1 Application topically 2 (two) times daily. 30 g 2   ondansetron  (ZOFRAN -ODT) 4 MG disintegrating tablet Take 1 tablet (4 mg total) by mouth every 8 (eight) hours as needed for nausea or vomiting. 20 tablet 5   pantoprazole  (PROTONIX ) 40 MG tablet TAKE 1 TABLET BY  MOUTH EVERY DAY 90 tablet 1   sulfaSALAzine  (AZULFIDINE ) 500 MG tablet TAKE 2 TABLETS BY MOUTH TWICE A DAY 120 tablet 2   Zoledronic  Acid (RECLAST  IV) Inject into the vein. Infusion: 12/03/2023     No current facility-administered medications for this visit.    Allergies as of 11/02/2024 - Review Complete 10/05/2024  Allergen Reaction Noted   Dust mite extract Other (See Comments) 09/17/2020    Family History  Problem Relation Age of Onset   CVA Mother    CAD Mother    Diabetes Mother    COPD Mother    Fibromyalgia Mother    Depression Mother    Hypertension Mother    Emphysema Mother        smoked   Heart attack Mother    Prostate cancer Father    Cancer - Prostate Father    Rheum arthritis Sister    Skin cancer Brother    Kidney Stones Brother    Hernia Brother    Depression Daughter    Healthy Son    Healthy Son    Colon polyps Neg Hx    Colon cancer Neg Hx    Esophageal cancer Neg Hx    Stomach cancer Neg Hx    Rectal cancer Neg Hx     Social History   Socioeconomic History   Marital status: Divorced    Spouse name: Not on file   Number of children: 3   Years of education: Not on file   Highest education level: Associate degree: occupational, scientist, product/process development, or vocational program  Occupational History   Occupation: RETIRED  Tobacco Use   Smoking status:  Every Day    Current packs/day: 0.75    Average packs/day: 0.8 packs/day for 49.0 years (36.8 ttl pk-yrs)    Types: Cigarettes    Passive exposure: Current   Smokeless tobacco: Never   Tobacco comments:    Smokes half or a pack.  Vaping Use   Vaping status: Former  Substance and Sexual Activity   Alcohol use: Not Currently   Drug use: Never   Sexual activity: Not Currently    Partners: Male  Other Topics Concern   Not on file  Social History Narrative   Son and his girlfriend Lives with her-2025 and a dog   Social Drivers of Health   Financial Resource Strain: High Risk (07/20/2024)   Overall Financial Resource Strain (CARDIA)    Difficulty of Paying Living Expenses: Hard  Food Insecurity: Food Insecurity Present (07/20/2024)   Hunger Vital Sign    Worried About Running Out of Food in the Last Year: Sometimes true    Ran Out of Food in the Last Year: Sometimes true  Transportation Needs: Unmet Transportation Needs (07/20/2024)   PRAPARE - Transportation    Lack of Transportation (Medical): Yes    Lack of Transportation (Non-Medical): Yes  Physical Activity: Inactive (07/20/2024)   Exercise Vital Sign    Days of Exercise per Week: 0 days    Minutes of Exercise per Session: Not on file  Stress: Stress Concern Present (07/20/2024)   [employer] of Occupational Health - Occupational Stress Questionnaire    Feeling of Stress: To some extent  Social Connections: Moderately Isolated (07/20/2024)   Social Connection and Isolation Panel    Frequency of Communication with Friends and Family: More than three times a week    Frequency of Social Gatherings with Friends and Family: More than three times a week    Attends Religious Services:  1 to 4 times per year    Active Member of Clubs or Organizations: No    Attends Banker Meetings: Not on file    Marital Status: Widowed  Intimate Partner Violence: Patient Unable To Answer (01/21/2024)   Humiliation, Afraid, Rape,  and Kick questionnaire    Fear of Current or Ex-Partner: Patient unable to answer    Emotionally Abused: Patient unable to answer    Physically Abused: Patient unable to answer    Sexually Abused: Patient unable to answer    Review of Systems:  All other review of systems negative except as mentioned in the HPI.  Physical Exam: Vital signs There were no vitals taken for this visit.  General:   Alert,  Well-developed, well-nourished, pleasant and cooperative in NAD Airway:  Mallampati  Lungs:  Clear throughout to auscultation.   Heart:  Regular rate and rhythm; no murmurs, clicks, rubs,  or gallops. Abdomen:  Soft, nontender and nondistended. Normal bowel sounds.   Neuro/Psych:  Normal mood and affect. A and O x 3  Inocente Hausen, MD [facility]

## 2024-10-31 ENCOUNTER — Ambulatory Visit: Admitting: Podiatry

## 2024-11-02 ENCOUNTER — Encounter: Payer: Self-pay | Admitting: Pediatrics

## 2024-11-02 ENCOUNTER — Ambulatory Visit: Admitting: Pediatrics

## 2024-11-02 VITALS — BP 128/77 | HR 107 | Temp 98.2°F | Resp 17 | Ht 61.0 in | Wt 71.0 lb

## 2024-11-02 DIAGNOSIS — R112 Nausea with vomiting, unspecified: Secondary | ICD-10-CM

## 2024-11-02 DIAGNOSIS — K2289 Other specified disease of esophagus: Secondary | ICD-10-CM

## 2024-11-02 DIAGNOSIS — R634 Abnormal weight loss: Secondary | ICD-10-CM

## 2024-11-02 DIAGNOSIS — K449 Diaphragmatic hernia without obstruction or gangrene: Secondary | ICD-10-CM | POA: Diagnosis not present

## 2024-11-02 DIAGNOSIS — K219 Gastro-esophageal reflux disease without esophagitis: Secondary | ICD-10-CM | POA: Diagnosis not present

## 2024-11-02 DIAGNOSIS — R1310 Dysphagia, unspecified: Secondary | ICD-10-CM | POA: Diagnosis not present

## 2024-11-02 DIAGNOSIS — R142 Eructation: Secondary | ICD-10-CM

## 2024-11-02 MED ORDER — SODIUM CHLORIDE 0.9 % IV SOLN: 500.0000 mL | Freq: Once | INTRAVENOUS | Status: DC

## 2024-11-02 NOTE — Patient Instructions (Signed)
 Discharge instructions given. Biopsies taken. Handouts on Dilatation diet and Hiatal Hernia. Resume previous medications. YOU HAD AN ENDOSCOPIC PROCEDURE TODAY AT THE [facility]:   Refer to the procedure report that was given to you for any specific questions about what was found during the examination.  If the procedure report does not answer your questions, please call your gastroenterologist to clarify.  If you requested that your care partner not be given the details of your procedure findings, then the procedure report has been included in a sealed envelope for you to review at your convenience later.  YOU SHOULD EXPECT: Some feelings of bloating in the abdomen. Passage of more gas than usual.  Walking can help get rid of the air that was put into your GI tract during the procedure and reduce the bloating. If you had a lower endoscopy (such as a colonoscopy or flexible sigmoidoscopy) you may notice spotting of blood in your stool or on the toilet paper. If you underwent a bowel prep for your procedure, you may not have a normal bowel movement for a few days.  Please Note:  You might notice some irritation and congestion in your nose or some drainage.  This is from the oxygen used during your procedure.  There is no need for concern and it should clear up in a day or so.  SYMPTOMS TO REPORT IMMEDIATELY:   Following upper endoscopy (EGD)  Vomiting of blood or coffee ground material  New chest pain or pain under the shoulder blades  Painful or persistently difficult swallowing  New shortness of breath  Fever of 100F or higher  Black, tarry-looking stools  For urgent or emergent issues, a gastroenterologist can be reached at any hour by calling [phone]. Do not use MyChart messaging for urgent concerns.    DIET:  We do recommend a small meal at first, but then you may proceed to your regular diet.  Drink plenty of fluids but you should avoid alcoholic beverages for 24  hours.  ACTIVITY:  You should plan to take it easy for the rest of today and you should NOT DRIVE or use heavy machinery until tomorrow (because of the sedation medicines used during the test).    FOLLOW UP: Our staff will call the number listed on your records the next business day following your procedure.  We will call around 7:15- 8:00 am to check on you and address any questions or concerns that you may have regarding the information given to you following your procedure. If we do not reach you, we will leave a message.     If any biopsies were taken you will be contacted by phone or by letter within the next 1-3 weeks.  Please call us  at [phone] if you have not heard about the biopsies in 3 weeks.    SIGNATURES/CONFIDENTIALITY: You and/or your care partner have signed paperwork which will be entered into your electronic medical record.  These signatures attest to the fact that that the information above on your After Visit Summary has been reviewed and is understood.  Full responsibility of the confidentiality of this discharge information lies with you and/or your care-partner.

## 2024-11-02 NOTE — Op Note (Signed)
 [facility] Patient Name: Abigail Lamb Procedure Date: 11/02/2024 2:53 PM MRN: [id number] Endoscopist: Inocente Hausen , MD, [id number] Age: 73 Referring MD:  Date of Birth: 04-02-1951 Gender: Female Account #: [id number] Procedure:                Upper GI endoscopy Indications:              Follow-up of gastro-esophageal reflux disease,                            Dysphagia, Eructation, Nausea, Weight loss Medicines:                Monitored Anesthesia Care Procedure:                Pre-Anesthesia Assessment:                           - Prior to the procedure, a History and Physical                            was performed, and patient medications and                            allergies were reviewed. The patient's tolerance of                            previous anesthesia was also reviewed. The risks                            and benefits of the procedure and the sedation                            options and risks were discussed with the patient.                            All questions were answered, and informed consent                            was obtained. Prior Anticoagulants: The patient has                            taken no anticoagulant or antiplatelet agents. ASA                            Grade Assessment: III - A patient with severe                            systemic disease. After reviewing the risks and                            benefits, the patient was deemed in satisfactory                            condition to undergo the procedure.  After obtaining informed consent, the endoscope was                            passed under direct vision. Throughout the                            procedure, the patient's blood pressure, pulse, and                            oxygen saturations were monitored continuously. The                            GIF [id number] #[id number] was introduced through the                            mouth, and  advanced to the second part of duodenum.                            The upper GI endoscopy was accomplished without                            difficulty. The patient tolerated the procedure                            well. Scope In: Scope Out: Findings:                 No endoscopic abnormality was evident in the                            esophagus to explain the patient's complaint of                            dysphagia. It was decided, however, to proceed with                            dilation of the entire esophagus. A guidewire was                            placed and the scope was withdrawn. Dilation was                            performed with a Savary dilator with no resistance                            at 16 mm and 17 mm.                           Two tongues of salmon-colored mucosa were present                            in the lower esophagus. No other visible  abnormalities were present. Biopsies were taken                            with a cold forceps for histology.                           The gastric body, gastric antrum, cardia (on                            retroflexion) and gastric fundus (on retroflexion)                            were normal. Biopsies were taken with a cold                            forceps for Helicobacter pylori testing.                           A small hiatal hernia was present.                           The duodenal bulb and second portion of the                            duodenum were normal. Biopsies for histology were                            taken with a cold forceps for evaluation of celiac                            disease. Complications:            No immediate complications. Estimated blood loss:                            Minimal. Estimated Blood Loss:     Estimated blood loss was minimal. Estimated blood                            loss was minimal. Impression:               - No endoscopic  esophageal abnormality to explain                            patient's dysphagia. Esophagus dilated. Dilated.                           - Salmon-colored mucosa suspicious for                            short-segment Barrett's esophagus. Biopsied.                           - Normal gastric body, antrum, cardia and gastric  fundus. Biopsied.                           - Small hiatal hernia.                           - Normal duodenal bulb and second portion of the                            duodenum. Biopsied. Recommendation:           - Discharge patient to home (ambulatory).                           - Await pathology results.                           - The findings and recommendations were discussed                            with the patient's family.                           - Patient has a contact number available for                            emergencies. The signs and symptoms of potential                            delayed complications were discussed with the                            patient. Return to normal activities tomorrow.                            Written discharge instructions were provided to the                            patient. Inocente Hausen, MD 11/02/2024 3:18:13 PM This report has been signed electronically.

## 2024-11-02 NOTE — Progress Notes (Signed)
 Report given to PACU, vss

## 2024-11-02 NOTE — Progress Notes (Signed)
 Pt's states no medical or surgical changes since previsit or office visit.

## 2024-11-02 NOTE — Progress Notes (Signed)
 Robinul 0.1 mg IV given due large amount of secretions upon assessment.  MD made aware, vss

## 2024-11-02 NOTE — Progress Notes (Signed)
 Called to room to assist during endoscopic procedure.  Patient ID and intended procedure confirmed with present staff. Received instructions for my participation in the procedure from the performing physician.

## 2024-11-03 ENCOUNTER — Telehealth: Payer: Self-pay

## 2024-11-03 NOTE — Progress Notes (Unsigned)
 Office Visit Note  Patient: Abigail Lamb             Date of Birth: 1951-05-04           MRN: [id number]             PCP: Joshua Debby CROME, MD Referring: Joshua Debby CROME, MD Visit Date: 11/17/2024 Occupation: Data Unavailable  Subjective:  Difficulty with balance   History of Present Illness: Abigail Lamb is a 73 y.o. female with history of rheumatoid arthritis and osteoarthritis.  Patient remains on  Plaquenil  200 mg 1 tablet daily and sulfasalazine  500 mg 2 tablets by mouth twice daily.  She is tolerating combination therapy without any side effects and has not had any gaps in therapy.  She has had several new concerns since her last office visit.  She has been experiencing increased neck pain, dizziness, and issues with balance.  Patient has had several falls since her last visit.  Patient is scheduled to see Dr. Onetha on 11/29/2024 to discuss the next steps in treatment.  She has been working with home physical therapy 1-2 times per week and has not made much progress.  Patient has remained under the care of Dr. Gershon for non-healing ulcer on the left foot. She denies any signs of infection.     Activities of Daily Living:  Patient reports morning stiffness for 2 hours.   Patient Denies nocturnal pain.  Difficulty dressing/grooming: Denies Difficulty climbing stairs: Reports Difficulty getting out of chair: Reports Difficulty using hands for taps, buttons, cutlery, and/or writing: Reports  Review of Systems  Constitutional:  Positive for fatigue.  HENT:  Positive for mouth dryness. Negative for mouth sores.   Eyes:  Positive for dryness.  Respiratory:  Positive for shortness of breath.   Cardiovascular:  Positive for chest pain and palpitations.  Gastrointestinal:  Negative for blood in stool, constipation and diarrhea.  Endocrine: Positive for increased urination.  Genitourinary:  Positive for involuntary urination.  Musculoskeletal:  Positive for joint pain, gait  problem, joint pain, joint swelling, myalgias, muscle weakness, morning stiffness, muscle tenderness and myalgias.  Skin:  Positive for color change, rash and sensitivity to sunlight. Negative for hair loss.  Allergic/Immunologic: Positive for susceptible to infections.  Neurological:  Positive for dizziness and headaches.  Hematological:  Negative for swollen glands.  Psychiatric/Behavioral:  Positive for depressed mood. Negative for sleep disturbance. The patient is nervous/anxious.     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) 11/08/2021   Gastroparesis 11/07/2021   Irritable bowel syndrome with both constipation and diarrhea 11/07/2021   Encounter for general adult medical examination with abnormal findings 11/07/2021   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   Visit for screening mammogram 08/02/2020   Rheumatoid arthritis involving multiple sites with positive rheumatoid factor (HCC) 08/02/2020   Hyperlipidemia LDL goal <130 08/02/2020   Tobacco abuse 08/02/2020   Dupuytren's contracture of right hand 08/02/2020   Pulmonary emphysema (HCC)    GOLD C COPD by GOLD classification (HCC)  01/30/2020   Neurodermatitis 03/28/2019   Primary insomnia 03/28/2019   Recurrent depression 03/28/2019    Past Medical History:  Diagnosis Date   Arthritis    Celiac disease    COPD (chronic obstructive pulmonary disease) (HCC)    Encephalopathy    As teenager   Gastroparesis     GERD (gastroesophageal reflux disease)    Hyperlipidemia    Hypertension    IBS (irritable bowel syndrome)    MGUS (monoclonal gammopathy of unknown significance)    per patient    MGUS (monoclonal gammopathy of unknown significance)    Osteoporosis    per patient, diagnosed by [facility] in [facility]   PUD (peptic ulcer disease)    Pulmonary emphysema (HCC)    Recurrent depression    Smoker     Family History  Problem Relation Age of Onset   CVA Mother    CAD Mother    Diabetes Mother    COPD Mother    Fibromyalgia Mother    Depression Mother    Hypertension Mother    Emphysema Mother        smoked   Heart attack Mother    Prostate cancer Father    Cancer - Prostate Father    Rheum arthritis Sister    Skin cancer Brother    Kidney Stones Brother    Hernia Brother    Depression Daughter    Healthy Son    Healthy Son    Colon polyps Neg Hx    Colon cancer Neg Hx    Esophageal cancer Neg Hx    Stomach cancer Neg Hx    Rectal cancer Neg Hx    Past Surgical History:  Procedure Laterality Date   ABCESS DRAINAGE Left    under left arm   COLONOSCOPY     COLONOSCOPY  07/07/2024   spinal tap     age 6   TONSILLECTOMY  83   Social History   Tobacco Use   Smoking status: Every Day    Current packs/day: 0.75    Average packs/day: 0.8 packs/day for 49.0 years (36.8 ttl pk-yrs)    Types: Cigarettes    Passive exposure: Current   Smokeless tobacco: Never   Tobacco comments:    Smokes half or a pack.  Vaping Use   Vaping status: Former  Substance Use Topics   Alcohol use: Not Currently   Drug use: Not Currently   Social History   Social History Narrative   Son and his girlfriend Lives with her-2025 and a dog     Immunization History  Administered Date(s) Administered   Fluad Quad(high Dose 65+) 11/08/2021, 10/27/2022   Fluad Trivalent(High Dose 65+) 11/17/2023   INFLUENZA, HIGH DOSE SEASONAL PF 12/30/2019   Influenza-Unspecified 01/04/2018,  12/15/2018, 09/11/2020   PNEUMOCOCCAL CONJUGATE-20 11/08/2021   Pneumococcal Polysaccharide-23 08/02/2020   Tdap 11/05/2020   Zoster Recombinant(Shingrix ) 03/20/2021, 11/27/2021     Objective: Vital Signs: BP 103/67   Pulse 93   Temp (!) 97.4 F (36.3 C)   Resp 16   Ht 5' 1.5 (1.562 m)   Wt 71 lb 6.4 oz (32.4 kg)   BMI 13.27 kg/m    Physical Exam Vitals and nursing note reviewed.  Constitutional:      Appearance: She is well-developed.  HENT:     Head: Normocephalic and atraumatic.  Eyes:     Conjunctiva/sclera: Conjunctivae normal.  Cardiovascular:     Rate and Rhythm: Normal rate and regular rhythm.  Heart sounds: Normal heart sounds.  Pulmonary:     Effort: Pulmonary effort is normal.     Breath sounds: Normal breath sounds.  Abdominal:     General: Bowel sounds are normal.     Palpations: Abdomen is soft.  Musculoskeletal:     Cervical back: Normal range of motion.  Lymphadenopathy:     Cervical: No cervical adenopathy.  Skin:    General: Skin is warm and dry.     Capillary Refill: Capillary refill takes less than 2 seconds.  Neurological:     Mental Status: She is alert and oriented to person, place, and time.  Psychiatric:        Behavior: Behavior normal.      Musculoskeletal Exam: C-spine limited ROM.  Shoulder joints, elbow joints, wrist joints, MCPs, PIPs, DIPs have good range of motion with no synovitis.  Rheumatoid nodules noted on the extensor surface of both elbows.  Limited range of motion of both wrist joints with synovial thickening bilaterally.  Warmth and mild inflammation on the dorsal aspect of the left wrist noted.  Synovial thickening of all MCP joints with subluxation and ulnar deviation.  Inability to extend MCP joints. Knee joints have good range of motion no warmth or effusion.  Ankle joints have good range of motion no tenderness or joint swelling.    CDAI Exam: CDAI Score: -- Patient Global: --; Provider Global: -- Swollen: --;  Tender: -- Joint Exam 11/17/2024   No joint exam has been documented for this visit   There is currently no information documented on the homunculus. Go to the Rheumatology activity and complete the homunculus joint exam.  Investigation: No additional findings.  Imaging: MR CERVICAL SPINE WO CONTRAST Result Date: 10/22/2024 Table formatting from the original result was not included. [facility] NEURO[facility] REPORT STUDY DATE: 10/20/24 PATIENT NAME: Telia Amundson DOB: [birth year] MRN: [id number] ORDERING CLINICIAN: Onita Duos, MD CLINICAL HISTORY: 73 y.o. year old female with: 1. Gait abnormality  2. Risk for falls   EXAM: MR CERVICAL SPINE WO CONTRAST TECHNIQUE: MRI of the cervical spine was obtained utilizing multiplanar, multiecho pulse sequences. CONTRAST: Diagnostic Product Medications (last 72 hours)   None   COMPARISON: none IMAGING SITE: [facility] AT [address] [place] [address] FINDINGS: On sagittal views the vertebral bodies have normal height and alignment.  Reversal normal cervical curvature at C5-C6.  Degenerative spondylosis and disc bulging from C3-4 down to C6-7.  The spinal cord is normal in size and appearance. The posterior fossa, pituitary gland and paraspinal soft tissues are unremarkable.  On axial views: C2-3 no spinal stenosis or foraminal narrowing C3-4 disc bulging facet hypertrophy with moderate-severe spinal stenosis and severe bilateral foraminal stenosis; haziness STIR hyperintense signal on sagittal views C4-5 facet hypertrophy with moderate bilateral foraminal stenosis C5-6 loss of intervertebral disc space height and facet hypertrophy with mild bilateral foraminal stenosis C6-7 disc bulging and facet hypertrophy with moderate bilateral foraminal stenosis C7-T1 no spinal stenosis or foraminal narrowing Limited views of the soft tissues of the head and neck are unremarkable.   MRI  cervical spine (without) demonstrating: - At C3-4 disc bulging facet hypertrophy with moderate-severe spinal stenosis and severe bilateral foraminal stenosis; haziness STIR hyperintense signal on sagittal views. - At C4-5 facet hypertrophy with moderate bilateral foraminal stenosis. - At C6-7 disc bulging and facet hypertrophy with moderate bilateral foraminal stenosis. - At C5-6 loss of intervertebral disc space height and facet hypertrophy  with mild bilateral foraminal stenosis. INTERPRETING PHYSICIAN: EDUARD FABIENE HANLON, MD Certified in Neurology, Neurophysiology and Neuroimaging [facility] [address] [place], [place] [address] [phone]   Recent Labs: Lab Results  Component Value Date   WBC 9.0 09/19/2024   HGB 14.7 09/19/2024   PLT 228.0 09/19/2024   NA 138 09/19/2024   K 4.1 09/19/2024   CL 102 09/19/2024   CO2 29 09/19/2024   GLUCOSE 115 (H) 09/19/2024   BUN 17 09/19/2024   CREATININE 0.58 09/19/2024   BILITOT 0.4 09/19/2024   ALKPHOS 89 09/19/2024   AST 28 09/19/2024   ALT 23 09/19/2024   PROT 7.4 09/19/2024   ALBUMIN 3.8 09/19/2024   CALCIUM 9.4 09/19/2024   GFRAA 107 10/26/2020   QFTBGOLDPLUS NEGATIVE 09/17/2020    Speciality Comments: PLQ Eye Exam: 01/26/2024 [facility] Follow up: 12/27/2024  Humira and Orencia were discontinued in the past due to frequent infections per patient.  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  Mild inflammation and warmth of the left wrist noted today.  Synovial thickening and ulnar deviation of the MCP joints.  Patient  continues to experience intermittent flares despite remaining on Plaquenil  200 mg 1 tablet daily and sulfasalazine  500 mg 2 tablets by mouth twice daily.  Overall her symptoms have been stable on the current treatment regimen.  She is taking Celebrex  50 mg twice daily for pain relief.   Medication options remain limited.  She has a nonhealing ulcer on the left foot and remains under the close care of Dr. Gershon.  No current infection.  Discussed the risk for immunosuppression while taking sulfasalazine . No medication changes will be made at this time.  She was advised to notify us  if she develops any signs or symptoms of a flare.  She will follow up in 3 months or sooner if needed.  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   High risk medication use - Plaquenil  200 mg 1 tablet daily and sulfasalazine  500 mg 2 tablets by mouth twice daily. CBC and CMP updated on 09/19/24.  Orders for CBC and CMP were released today. Discussed the importance of holding sulfasalazine  if she develops signs or symptoms of an infection and to resume once the infection has completely cleared.   - Plan: Comprehensive metabolic panel with GFR, CBC with Differential/Platelet  Primary osteoarthritis of both knees: She has good range of motion of both knee joints on examination today.  No warmth or effusion noted.  She is using a walker to assist with ambulation today.  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 Federico.BM biopsy 10/24/2020 revealed normocellular marrow with polytypic plasmacytosis (5%).  Age-related osteoporosis without current pathological fracture:  Forteo  daily injections started 05/29/22. D/c forteo  due to daily SE-nausea and dizziness.  First IV reclast  administered on 12/03/23. Previous DEXA 02/07/2022: LFN BMD 0.440 with T score -3.7. DEXA updated on 05/18/2024: Lumbar spine T-score -1.3.  Right femoral neck T-score -3.5.  Left  femoral neck T-score -3.1. She had a recent fall but no fracture.  Vitamin D  was 93 on 07/21/2024. Plan to  proceed with next IV Reclast  infusion in December 2025.  Plan to update CBC and CMP today prior to scheduling next Reclast  infusion. Due to update DEXA in May 2027.  Vitamin D  deficiency: Vitamin D  was 93 on 07/21/2024.  Neck pain: MRI of the cervical spine updated on 10/20/2024: C3-C4 disc bulging facet hypertrophy with moderate to severe spinal stenosis and severe bilateral foraminal stenosis.  C4-C5 facet hypertrophy with moderate bilateral foraminal stenosis.  C6-C7 disc bulging and facet hypertrophy with moderate bilateral foraminal stenosis.  C5-C6 loss of intervertebral disc space height and facet hypertrophy with bilateral foraminal stenosis.   She has limited ROM with discomfort.  Issues with balance and more frequent falls.  Using a walker to assist with ambulation.  She is scheduled to see Dr. Onetha on 11/29/24.  She has been working with home PT 1-2 times per week.  She continues to have issues with balance and is using a walker to assist with ambulation.   Other medical conditions are listed as follows:  Prediabetes  Hyperlipidemia LDL goal <130  Stage 2 moderate COPD by GOLD classification (HCC)  Panlobular emphysema (HCC)  Lung nodules  Tobacco abuse  Kidney stone  Recurrent depression  Primary insomnia  Other fatigue  Muscle cramping  History of anemia  Orders: Orders Placed This Encounter  Procedures   Comprehensive metabolic panel with GFR   CBC with Differential/Platelet   Meds ordered this encounter  Medications   sulfaSALAzine  (AZULFIDINE ) 500 MG tablet    Sig: Take 2 tablets (1,000 mg total) by mouth 2 (two) times daily.    Dispense:  120 tablet    Refill:  2   hydroxychloroquine  (PLAQUENIL ) 200 MG tablet    Sig: Take 1 tablet (200 mg total) by mouth daily.    Dispense:  90 tablet    Refill:  0     Follow-Up Instructions: Return in about  3 months (around 02/17/2025) for Rheumatoid arthritis.   Waddell CHRISTELLA Craze, PA-C  Note - This record has been created using Dragon software.  Chart creation errors have been sought, but may not always  have been located. Such creation errors do not reflect on  the standard of medical care.

## 2024-11-03 NOTE — Telephone Encounter (Signed)
 Follow up call to pt, no answer.

## 2024-11-07 ENCOUNTER — Ambulatory Visit: Payer: Self-pay | Admitting: Pediatrics

## 2024-11-07 LAB — SURGICAL PATHOLOGY

## 2024-11-10 NOTE — Telephone Encounter (Signed)
 Patient's daughter called in stated patient would like to be referred to same neurosurgeon daughter sees, Dr. Arley Helling at [facility]. Can we reroute this referral to them?  Thank you

## 2024-11-12 ENCOUNTER — Other Ambulatory Visit: Payer: Self-pay | Admitting: Physician Assistant

## 2024-11-14 ENCOUNTER — Telehealth: Payer: Self-pay | Admitting: Pharmacist

## 2024-11-14 NOTE — Telephone Encounter (Signed)
 Patient due for second Reclast  infusion on 12/02/2024  Has upcoming OV on 11/17/2024. Will need CMP updated Vitamin D  on 07/21/2024 wnl  Sherry Pennant, PharmD, MPH, BCPS, CPP Clinical Pharmacist [facility])

## 2024-11-16 ENCOUNTER — Telehealth: Payer: Self-pay | Admitting: Neurology

## 2024-11-16 NOTE — Telephone Encounter (Signed)
 Returned call to Nov[place], PT, gave verbal orders as requested due to issues with insurance

## 2024-11-16 NOTE — Addendum Note (Signed)
 Addended by: JOSHUA MAURILIO CROME on: 11/16/2024 10:34 AM   Modules accepted: Orders

## 2024-11-16 NOTE — Telephone Encounter (Signed)
 PT Abigail Lamb reports that initial VO were for 1 week 5, due to a conflict with insurance it wasn't until week 3 that they went out twice to make up for missed initial visits. On next week there will be an extended visit for Home Health PT if there are questions she can be reached at [phone] vm secure

## 2024-11-17 ENCOUNTER — Ambulatory Visit: Attending: Physician Assistant | Admitting: Physician Assistant

## 2024-11-17 ENCOUNTER — Encounter: Payer: Self-pay | Admitting: Physician Assistant

## 2024-11-17 VITALS — BP 103/67 | HR 93 | Temp 97.4°F | Resp 16 | Ht 61.5 in | Wt 71.4 lb

## 2024-11-17 DIAGNOSIS — R252 Cramp and spasm: Secondary | ICD-10-CM

## 2024-11-17 DIAGNOSIS — E785 Hyperlipidemia, unspecified: Secondary | ICD-10-CM

## 2024-11-17 DIAGNOSIS — R7303 Prediabetes: Secondary | ICD-10-CM

## 2024-11-17 DIAGNOSIS — F339 Major depressive disorder, recurrent, unspecified: Secondary | ICD-10-CM

## 2024-11-17 DIAGNOSIS — Z720 Tobacco use: Secondary | ICD-10-CM

## 2024-11-17 DIAGNOSIS — D472 Monoclonal gammopathy: Secondary | ICD-10-CM

## 2024-11-17 DIAGNOSIS — M810 Age-related osteoporosis without current pathological fracture: Secondary | ICD-10-CM

## 2024-11-17 DIAGNOSIS — F5101 Primary insomnia: Secondary | ICD-10-CM

## 2024-11-17 DIAGNOSIS — M0630 Rheumatoid nodule, unspecified site: Secondary | ICD-10-CM

## 2024-11-17 DIAGNOSIS — J449 Chronic obstructive pulmonary disease, unspecified: Secondary | ICD-10-CM

## 2024-11-17 DIAGNOSIS — M0579 Rheumatoid arthritis with rheumatoid factor of multiple sites without organ or systems involvement: Secondary | ICD-10-CM

## 2024-11-17 DIAGNOSIS — J431 Panlobular emphysema: Secondary | ICD-10-CM

## 2024-11-17 DIAGNOSIS — Z79899 Other long term (current) drug therapy: Secondary | ICD-10-CM | POA: Diagnosis not present

## 2024-11-17 DIAGNOSIS — E559 Vitamin D deficiency, unspecified: Secondary | ICD-10-CM

## 2024-11-17 DIAGNOSIS — M170 Bilateral primary osteoarthritis of knee: Secondary | ICD-10-CM | POA: Diagnosis not present

## 2024-11-17 DIAGNOSIS — M542 Cervicalgia: Secondary | ICD-10-CM

## 2024-11-17 DIAGNOSIS — Z862 Personal history of diseases of the blood and blood-forming organs and certain disorders involving the immune mechanism: Secondary | ICD-10-CM

## 2024-11-17 DIAGNOSIS — R5383 Other fatigue: Secondary | ICD-10-CM

## 2024-11-17 DIAGNOSIS — N200 Calculus of kidney: Secondary | ICD-10-CM

## 2024-11-17 DIAGNOSIS — R918 Other nonspecific abnormal finding of lung field: Secondary | ICD-10-CM

## 2024-11-17 LAB — COMPREHENSIVE METABOLIC PANEL WITH GFR
AG Ratio: 1.1 (calc) (ref 1.0–2.5)
ALT: 20 U/L (ref 6–29)
AST: 29 U/L (ref 10–35)
Albumin: 3.7 g/dL (ref 3.6–5.1)
Alkaline phosphatase (APISO): 106 U/L (ref 37–153)
BUN: 24 mg/dL (ref 7–25)
CO2: 33 mmol/L — ABNORMAL HIGH (ref 20–32)
Calcium: 9.8 mg/dL (ref 8.6–10.4)
Chloride: 102 mmol/L (ref 98–110)
Creat: 0.6 mg/dL (ref 0.60–1.00)
Globulin: 3.4 g/dL (ref 1.9–3.7)
Glucose, Bld: 89 mg/dL (ref 65–99)
Potassium: 4.7 mmol/L (ref 3.5–5.3)
Sodium: 139 mmol/L (ref 135–146)
Total Bilirubin: 0.3 mg/dL (ref 0.2–1.2)
Total Protein: 7.1 g/dL (ref 6.1–8.1)
eGFR: 95 mL/min/1.73m2 (ref >=60)

## 2024-11-17 LAB — CBC WITH DIFFERENTIAL/PLATELET
Absolute Lymphocytes: 3541 {cells}/uL (ref 850–3900)
Absolute Monocytes: 1055 {cells}/uL — ABNORMAL HIGH (ref 200–950)
Basophils Absolute: 78 {cells}/uL (ref 0–200)
Basophils Relative: 0.7 %
Eosinophils Absolute: 255 {cells}/uL (ref 15–500)
Eosinophils Relative: 2.3 %
HCT: 44.9 % (ref 35.0–45.0)
Hemoglobin: 14.5 g/dL (ref 11.7–15.5)
MCH: 31.4 pg (ref 27.0–33.0)
MCHC: 32.3 g/dL (ref 32.0–36.0)
MCV: 97.2 fL (ref 80.0–100.0)
MPV: 10.5 fL (ref 7.5–12.5)
Monocytes Relative: 9.5 %
Neutro Abs: 6172 {cells}/uL (ref 1500–7800)
Neutrophils Relative %: 55.6 %
Platelets: 247 Thousand/uL (ref 140–400)
RBC: 4.62 Million/uL (ref 3.80–5.10)
RDW: 11.9 % (ref 11.0–15.0)
Total Lymphocyte: 31.9 %
WBC: 11.1 Thousand/uL — ABNORMAL HIGH (ref 3.8–10.8)

## 2024-11-17 MED ORDER — HYDROXYCHLOROQUINE SULFATE 200 MG PO TABS: 200.0000 mg | ORAL_TABLET | Freq: Every day | ORAL | 0 refills | Status: AC

## 2024-11-17 MED ORDER — SULFASALAZINE 500 MG PO TABS: 1000.0000 mg | ORAL_TABLET | Freq: Two times a day (BID) | ORAL | 2 refills | Status: AC

## 2024-11-18 ENCOUNTER — Other Ambulatory Visit: Payer: Self-pay | Admitting: Pharmacist

## 2024-11-18 ENCOUNTER — Ambulatory Visit: Payer: Self-pay | Admitting: Physician Assistant

## 2024-11-18 ENCOUNTER — Telehealth (HOSPITAL_COMMUNITY): Payer: Self-pay | Admitting: Pharmacy Technician

## 2024-11-18 ENCOUNTER — Other Ambulatory Visit (HOSPITAL_COMMUNITY): Payer: Self-pay | Admitting: Physician Assistant

## 2024-11-18 NOTE — Progress Notes (Signed)
 Next infusion for Zoledronic  acid (Reclast ) IV (G6510) due on 12/02/24 and due for updated orders. Provider: Maya Nash, MD and Waddell Craze, PA-C Diagnosis: osteoporosis  Dose: 5mg  IV every 12 months  Last Clinic Visit: 11/17/2024 Next Clinic Visit: 04/25/2025  Last infusion: 12/03/2023  Labs: calcimu and renal function on 11/17/2024 wnl, vitamin D  on 07/21/24 wnl  Orders placed for Zoledronic  acid (Reclast ) IV (J3489) x 1 dose along with premedication of acetaminophen  650mg  p.o. and diphenhydramine  25mg  p.o. to be administered 30 minutes before medication infusion.  Orders placed for [facility] [phone]) [place]. Infusion center will reach out to schedule once benefits are verified.  Sherry Pennant, PharmD, MPH, BCPS, CPP Clinical Pharmacist [facility])

## 2024-11-18 NOTE — Telephone Encounter (Signed)
 Auth Submission: APPROVED Site of care: [facility] Payer: [employer] MEDICARE Medication & CPT/J Code(s) submitted: Reclast  (Zolendronic acid) [id number] Diagnosis Code:  Route of submission (phone, fax, portal):  Phone # Fax # Auth type: Buy/Bill HB Units/visits requested: 5mg  x 1 dose, every 12 months Reference number: [id number] Approval from: 11/18/24 to 11/18/25     Dagoberto Armour, CPhT [facility] Phone: [phone] 11/18/2024

## 2024-11-18 NOTE — Progress Notes (Signed)
 WBC count is borderline elevated, absolute monocytes are slightly elevated. Rest of CBC WNL.  We will continue to monitor.   CMP stable.

## 2024-11-23 ENCOUNTER — Other Ambulatory Visit: Payer: Self-pay | Admitting: Internal Medicine

## 2024-11-23 DIAGNOSIS — M0579 Rheumatoid arthritis with rheumatoid factor of multiple sites without organ or systems involvement: Secondary | ICD-10-CM

## 2024-12-01 ENCOUNTER — Inpatient Hospital Stay

## 2024-12-08 ENCOUNTER — Ambulatory Visit (HOSPITAL_COMMUNITY)
Admission: RE | Admit: 2024-12-08 | Discharge: 2024-12-08 | Disposition: A | Discharge status: Home / Self Care | Source: Ambulatory Visit | Attending: Physician Assistant | Admitting: Physician Assistant

## 2024-12-08 VITALS — BP 122/54 | HR 80 | Temp 98.1°F | Resp 16

## 2024-12-08 DIAGNOSIS — M810 Age-related osteoporosis without current pathological fracture: Secondary | ICD-10-CM | POA: Diagnosis present

## 2024-12-08 MED ORDER — ACETAMINOPHEN 325 MG PO TABS: 650.0000 mg | ORAL_TABLET | Freq: Once | ORAL | Status: DC

## 2024-12-08 MED ORDER — SODIUM CHLORIDE 0.9 % IV SOLN: INTRAVENOUS | Status: DC

## 2024-12-08 MED ORDER — ZOLEDRONIC ACID 5 MG/100ML IV SOLN
5.0000 mg | Freq: Once | INTRAVENOUS | Status: AC
  Administered 2024-12-08: 5 mg via INTRAVENOUS

## 2024-12-08 MED ORDER — ZOLEDRONIC ACID 5 MG/100ML IV SOLN
INTRAVENOUS | Status: AC
  Filled 2024-12-08: qty 100

## 2024-12-08 MED ORDER — DIPHENHYDRAMINE HCL 25 MG PO CAPS: 25.0000 mg | ORAL_CAPSULE | Freq: Once | ORAL | Status: DC

## 2024-12-13 ENCOUNTER — Other Ambulatory Visit: Payer: Self-pay | Admitting: Internal Medicine

## 2024-12-14 ENCOUNTER — Ambulatory Visit: Admitting: Physician Assistant

## 2024-12-15 ENCOUNTER — Other Ambulatory Visit: Payer: Self-pay | Admitting: Physician Assistant

## 2024-12-15 ENCOUNTER — Telehealth: Payer: Self-pay

## 2024-12-15 NOTE — Telephone Encounter (Signed)
 Copied from CRM #[id number]. Topic: Clinical - Prescription Issue >> Dec 15, 2024  2:55 PM Rea ORN wrote: Reason for CRM: Pt's clonazePAM  (KLONOPIN ) 0.5 MG tablet was refused due to pt needing an appointment. Pt scheduled and appt for 1/22 and is asking for enough meds to get her to that appt since she is out. Please send rx to [id number] - [place], [place] - [address]. Pt placed on wait list to get sooner appt.   Please call back [phone]

## 2024-12-16 ENCOUNTER — Other Ambulatory Visit: Payer: Self-pay

## 2024-12-16 NOTE — Telephone Encounter (Signed)
 Medication refill request has been sent to Dr. Yetta Barre

## 2024-12-17 MED ORDER — CLONAZEPAM 0.5 MG PO TABS: 0.5000 mg | ORAL_TABLET | Freq: Two times a day (BID) | ORAL | 0 refills | Status: DC | PRN

## 2024-12-27 ENCOUNTER — Ambulatory Visit: Payer: Self-pay

## 2024-12-27 NOTE — Telephone Encounter (Signed)
 FYI Only or Action Required?: FYI only for provider: appointment scheduled on 1/2.  Patient was last seen in primary care on 09/16/2024 by Elnor Lauraine BRAVO, NP.  Called Nurse Triage reporting Cough.  Symptoms began several days ago.  Interventions attempted: Nothing.  Symptoms are: unchanged.  Triage Disposition: See Physician Within 24 Hours  Patient/caregiver understands and will follow disposition?: Yes, will follow disposition  Copied from CRM #[id number]. Topic: Clinical - Red Word Triage >> Dec 27, 2024  9:58 AM Antony RAMAN wrote: Red Word that prompted transfer to Nurse Triage: hot and cold, hard time breathing, no energy. Has copd >> Dec 27, 2024 10:03 AM Antony S wrote: Call dropped while waiting, called back and no answer. Posted in red word chat Reason for Disposition  [1] Known COPD or other severe lung disease (i.e., bronchiectasis, cystic fibrosis, lung surgery) AND [2] symptoms getting worse (i.e., increased sputum purulence or amount, increased breathing difficulty  Answer Assessment - Initial Assessment Questions 1. ONSET: When did the cough begin?      Few days 2. SEVERITY: How bad is the cough today?      moderate 3. SPUTUM: Describe the color of your sputum (e.g., none, dry cough; clear, white, yellow, green)     clear 4. HEMOPTYSIS: Are you coughing up any blood? If Yes, ask: How much? (e.g., flecks, streaks, tablespoons, etc.)     denies 5. DIFFICULTY BREATHING: Are you having difficulty breathing? If Yes, ask: How bad is it? (e.g., mild, moderate, severe)      Per daughter is who is not with the pt, moderate to severe-still smoking 6. FEVER: Do you have a fever? If Yes, ask: What is your temperature, how was it measured, and when did it start?     Hot and cold chills, no temp taken 8. LUNG HISTORY: Do you have any history of lung disease?  (e.g., pulmonary embolus, asthma, emphysema)     COPD 10. OTHER SYMPTOMS: Do you have any other  symptoms? (e.g., runny nose, wheezing, chest pain)       Denies  Caller unsure if pt is having worsening SOB, states d/t COPD pt is always SOB. Confirms that pt has still been sitting outside and smoking. Advised caller that pt should be seen today at [facility] or ED if she is stating that she is more SOB than normal. Caller understands.  Protocols used: Cough - Acute Productive-A-AH

## 2024-12-27 NOTE — Telephone Encounter (Signed)
 Patient has an appointment scheduled for Urgent care and to see Boby Mackintosh NP-C

## 2024-12-28 ENCOUNTER — Other Ambulatory Visit: Payer: Self-pay

## 2024-12-28 ENCOUNTER — Emergency Department (HOSPITAL_COMMUNITY)

## 2024-12-28 ENCOUNTER — Emergency Department (HOSPITAL_COMMUNITY)
Admission: EM | Admit: 2024-12-28 | Discharge: 2024-12-28 | Discharge status: Against Medical Advice | Attending: Emergency Medicine | Admitting: Emergency Medicine

## 2024-12-28 ENCOUNTER — Ambulatory Visit (HOSPITAL_COMMUNITY)

## 2024-12-28 DIAGNOSIS — R531 Weakness: Secondary | ICD-10-CM | POA: Diagnosis not present

## 2024-12-28 DIAGNOSIS — Z5321 Procedure and treatment not carried out due to patient leaving prior to being seen by health care provider: Secondary | ICD-10-CM | POA: Diagnosis not present

## 2024-12-28 DIAGNOSIS — W1839XA Other fall on same level, initial encounter: Secondary | ICD-10-CM | POA: Diagnosis not present

## 2024-12-28 DIAGNOSIS — R0602 Shortness of breath: Secondary | ICD-10-CM | POA: Diagnosis not present

## 2024-12-28 DIAGNOSIS — M25552 Pain in left hip: Secondary | ICD-10-CM | POA: Diagnosis present

## 2024-12-28 LAB — CBC WITH DIFFERENTIAL/PLATELET
Abs Immature Granulocytes: 0.03 K/uL (ref 0.00–0.07)
Basophils Absolute: 0.1 K/uL (ref 0.0–0.1)
Basophils Relative: 1 %
Eosinophils Absolute: 0.1 K/uL (ref 0.0–0.5)
Eosinophils Relative: 1 %
HCT: 46.7 % — ABNORMAL HIGH (ref 36.0–46.0)
Hemoglobin: 15.8 g/dL — ABNORMAL HIGH (ref 12.0–15.0)
Immature Granulocytes: 0 %
Lymphocytes Relative: 28 %
Lymphs Abs: 2.7 K/uL (ref 0.7–4.0)
MCH: 32.6 pg (ref 26.0–34.0)
MCHC: 33.8 g/dL (ref 30.0–36.0)
MCV: 96.5 fL (ref 80.0–100.0)
Monocytes Absolute: 1.3 K/uL — ABNORMAL HIGH (ref 0.1–1.0)
Monocytes Relative: 13 %
Neutro Abs: 5.4 K/uL (ref 1.7–7.7)
Neutrophils Relative %: 57 %
Platelets: 54 K/uL — ABNORMAL LOW (ref 150–400)
RBC: 4.84 MIL/uL (ref 3.87–5.11)
RDW: 13.2 % (ref 11.5–15.5)
WBC: 9.5 K/uL (ref 4.0–10.5)
nRBC: 0 % (ref 0.0–0.2)

## 2024-12-28 LAB — URINALYSIS, ROUTINE W REFLEX MICROSCOPIC
Bilirubin Urine: NEGATIVE
Glucose, UA: NEGATIVE mg/dL
Hgb urine dipstick: NEGATIVE
Ketones, ur: NEGATIVE mg/dL
Nitrite: NEGATIVE
Protein, ur: 100 mg/dL — AB
Specific Gravity, Urine: 1.016 (ref 1.005–1.030)
pH: 6 (ref 5.0–8.0)

## 2024-12-28 LAB — COMPREHENSIVE METABOLIC PANEL WITH GFR
ALT: 54 U/L — ABNORMAL HIGH (ref 0–44)
AST: 40 U/L (ref 15–41)
Albumin: 3.9 g/dL (ref 3.5–5.0)
Alkaline Phosphatase: 121 U/L (ref 38–126)
Anion gap: 11 (ref 5–15)
BUN: 22 mg/dL (ref 8–23)
CO2: 28 mmol/L (ref 22–32)
Calcium: 9.9 mg/dL (ref 8.9–10.3)
Chloride: 99 mmol/L (ref 98–111)
Creatinine, Ser: 0.61 mg/dL (ref 0.44–1.00)
GFR, Estimated: >60 mL/min
Glucose, Bld: 105 mg/dL — ABNORMAL HIGH (ref 70–99)
Potassium: 4.7 mmol/L (ref 3.5–5.1)
Sodium: 137 mmol/L (ref 135–145)
Total Bilirubin: 0.4 mg/dL (ref 0.0–1.2)
Total Protein: 8.1 g/dL (ref 6.5–8.1)

## 2024-12-28 LAB — RESP PANEL BY RT-PCR (RSV, FLU A&B, COVID)  RVPGX2
Influenza A by PCR: NEGATIVE
Influenza B by PCR: NEGATIVE
Resp Syncytial Virus by PCR: NEGATIVE
SARS Coronavirus 2 by RT PCR: NEGATIVE

## 2024-12-28 LAB — CBG MONITORING, ED: Glucose-Capillary: 122 mg/dL — ABNORMAL HIGH (ref 70–99)

## 2024-12-28 NOTE — ED Provider Triage Note (Signed)
 Emergency Medicine Provider Triage Evaluation Note  Abigail Lamb , a 73 y.o. female  was evaluated in triage.  Pt presents with a constellation of complaints. Declining health for several months, with increasing generalized weakness over the last 2 weeks. Multiple falls at home due to weakness. Uses walker at baseline. Known DDD of lumbar spine, rheumatoid arthirits, osteoporosis  Review of Systems  Positive: Left hip pain, cough, intermittent chest discomfort Negative: Fever, chills  Physical Exam  BP (!) 106/95 (BP Location: Left Arm)   Pulse (!) 114   Temp 98.2 F (36.8 C)   Resp 18   Ht 5' 2 (1.575 m)   Wt 32.7 kg   SpO2 92%   BMI 13.17 kg/m  Gen:   Awake, no distress   Resp:  Normal effort  MSK:   Moves extremities without difficulty  Other:    Medical Decision Making  Medically screening exam initiated at 5:06 PM.  Appropriate orders placed.  Abigail Lamb was informed that the remainder of the evaluation will be completed by another provider, this initial triage assessment does not replace that evaluation, and the importance of remaining in the ED until their evaluation is complete.     Claudene Lenis, NP 12/28/24 1721

## 2024-12-28 NOTE — ED Triage Notes (Signed)
 PT here for many things. Weakness on left side and having trouble walking for months. Has left sided pain in all joints as well as right knee. Fell months ago and was xrayed and cleared but still having pain. Patient walks with walker at baseline.

## 2024-12-28 NOTE — ED Triage Notes (Addendum)
 Pt arrives POV, fell about 5 weeks ago and has had ongoing pain in her L hip. Pt increasingly unsteady on her feet, multiple falls I the last few weeks. Pain became unbearable yesterday and decided she needed to be seen. Uses walker at baseline.   Additional reports of weakness and productive cough for the past week.

## 2024-12-28 NOTE — ED Notes (Addendum)
 Patients daughter said her mom has an appointment with her doctor on Friday so they are leaving

## 2024-12-30 ENCOUNTER — Emergency Department (HOSPITAL_COMMUNITY)

## 2024-12-30 ENCOUNTER — Encounter (HOSPITAL_COMMUNITY): Payer: Self-pay | Admitting: Family Medicine

## 2024-12-30 ENCOUNTER — Observation Stay (HOSPITAL_COMMUNITY): Admission: EM | Admit: 2024-12-30 | Source: Home / Self Care

## 2024-12-30 ENCOUNTER — Ambulatory Visit: Admitting: Family Medicine

## 2024-12-30 DIAGNOSIS — N390 Urinary tract infection, site not specified: Secondary | ICD-10-CM | POA: Diagnosis not present

## 2024-12-30 DIAGNOSIS — F17210 Nicotine dependence, cigarettes, uncomplicated: Secondary | ICD-10-CM | POA: Diagnosis not present

## 2024-12-30 DIAGNOSIS — R2689 Other abnormalities of gait and mobility: Secondary | ICD-10-CM | POA: Diagnosis not present

## 2024-12-30 DIAGNOSIS — R0602 Shortness of breath: Secondary | ICD-10-CM | POA: Diagnosis present

## 2024-12-30 DIAGNOSIS — I10 Essential (primary) hypertension: Secondary | ICD-10-CM | POA: Insufficient documentation

## 2024-12-30 DIAGNOSIS — J9601 Acute respiratory failure with hypoxia: Principal | ICD-10-CM | POA: Insufficient documentation

## 2024-12-30 DIAGNOSIS — M069 Rheumatoid arthritis, unspecified: Secondary | ICD-10-CM | POA: Diagnosis present

## 2024-12-30 DIAGNOSIS — J441 Chronic obstructive pulmonary disease with (acute) exacerbation: Secondary | ICD-10-CM | POA: Diagnosis not present

## 2024-12-30 DIAGNOSIS — K589 Irritable bowel syndrome without diarrhea: Secondary | ICD-10-CM | POA: Diagnosis not present

## 2024-12-30 DIAGNOSIS — M0579 Rheumatoid arthritis with rheumatoid factor of multiple sites without organ or systems involvement: Secondary | ICD-10-CM | POA: Diagnosis not present

## 2024-12-30 DIAGNOSIS — Z79899 Other long term (current) drug therapy: Secondary | ICD-10-CM | POA: Diagnosis not present

## 2024-12-30 DIAGNOSIS — J439 Emphysema, unspecified: Secondary | ICD-10-CM | POA: Insufficient documentation

## 2024-12-30 HISTORY — DX: Lichen simplex chronicus: L28.0

## 2024-12-30 HISTORY — DX: Rheumatoid vasculitis with rheumatoid arthritis of unspecified site: M05.20

## 2024-12-30 LAB — CBC
HCT: 47 % — ABNORMAL HIGH (ref 36.0–46.0)
Hemoglobin: 14.9 g/dL (ref 12.0–15.0)
MCH: 31.7 pg (ref 26.0–34.0)
MCHC: 31.7 g/dL (ref 30.0–36.0)
MCV: 100 fL (ref 80.0–100.0)
Platelets: 84 K/uL — ABNORMAL LOW (ref 150–400)
RBC: 4.7 MIL/uL (ref 3.87–5.11)
RDW: 13.2 % (ref 11.5–15.5)
WBC: 19.2 K/uL — ABNORMAL HIGH (ref 4.0–10.5)
nRBC: 0 % (ref 0.0–0.2)

## 2024-12-30 LAB — BASIC METABOLIC PANEL WITH GFR
Anion gap: 9 (ref 5–15)
BUN: 20 mg/dL (ref 8–23)
CO2: 29 mmol/L (ref 22–32)
Calcium: 9.1 mg/dL (ref 8.9–10.3)
Chloride: 103 mmol/L (ref 98–111)
Creatinine, Ser: 0.61 mg/dL (ref 0.44–1.00)
GFR, Estimated: >60 mL/min
Glucose, Bld: 122 mg/dL — ABNORMAL HIGH (ref 70–99)
Potassium: 4.4 mmol/L (ref 3.5–5.1)
Sodium: 141 mmol/L (ref 135–145)

## 2024-12-30 LAB — URINALYSIS, ROUTINE W REFLEX MICROSCOPIC
Bilirubin Urine: NEGATIVE
Glucose, UA: NEGATIVE mg/dL
Hgb urine dipstick: NEGATIVE
Ketones, ur: NEGATIVE mg/dL
Nitrite: NEGATIVE
Protein, ur: 100 mg/dL — AB
Specific Gravity, Urine: 1.02 (ref 1.005–1.030)
WBC, UA: >50 WBC/hpf (ref 0–5)
pH: 5 (ref 5.0–8.0)

## 2024-12-30 LAB — D-DIMER, QUANTITATIVE: D-Dimer, Quant: 5.58 ug{FEU}/mL — ABNORMAL HIGH (ref 0.00–0.50)

## 2024-12-30 LAB — RESP PANEL BY RT-PCR (RSV, FLU A&B, COVID)  RVPGX2
Influenza A by PCR: NEGATIVE
Influenza B by PCR: NEGATIVE
Resp Syncytial Virus by PCR: NEGATIVE
SARS Coronavirus 2 by RT PCR: NEGATIVE

## 2024-12-30 MED ORDER — IOHEXOL 350 MG/ML SOLN
75.0000 mL | Freq: Once | INTRAVENOUS | Status: AC | PRN
  Administered 2024-12-30: 75 mL via INTRAVENOUS

## 2024-12-30 MED ORDER — AZITHROMYCIN 250 MG PO TABS
500.0000 mg | ORAL_TABLET | Freq: Every day | ORAL | Status: AC
  Administered 2024-12-31 – 2025-01-01 (×2): 500 mg via ORAL
  Filled 2024-12-30 (×2): qty 2

## 2024-12-30 MED ORDER — CELECOXIB 100 MG PO CAPS
100.0000 mg | ORAL_CAPSULE | Freq: Every day | ORAL | Status: DC
  Administered 2024-12-30: 100 mg via ORAL
  Filled 2024-12-30: qty 1

## 2024-12-30 MED ORDER — SODIUM CHLORIDE 0.9 % IV SOLN: 250.0000 mL | INTRAVENOUS | Status: AC | PRN

## 2024-12-30 MED ORDER — DULOXETINE HCL 60 MG PO CPEP
90.0000 mg | ORAL_CAPSULE | Freq: Every day | ORAL | Status: DC
  Administered 2024-12-30 – 2025-01-01 (×3): 90 mg via ORAL
  Filled 2024-12-30: qty 1
  Filled 2024-12-30: qty 3
  Filled 2024-12-30: qty 1

## 2024-12-30 MED ORDER — SODIUM CHLORIDE 0.9% FLUSH
3.0000 mL | Freq: Two times a day (BID) | INTRAVENOUS | Status: DC
  Administered 2024-12-30 – 2025-01-02 (×6): 3 mL via INTRAVENOUS

## 2024-12-30 MED ORDER — SULFASALAZINE 500 MG PO TABS
1000.0000 mg | ORAL_TABLET | Freq: Two times a day (BID) | ORAL | Status: DC
  Administered 2024-12-30 – 2025-01-02 (×6): 1000 mg via ORAL
  Filled 2024-12-30 (×6): qty 2

## 2024-12-30 MED ORDER — MAGNESIUM SULFATE IN D5W 1-5 GM/100ML-% IV SOLN
1.0000 g | Freq: Once | INTRAVENOUS | Status: AC
  Administered 2024-12-30: 1 g via INTRAVENOUS
  Filled 2024-12-30: qty 100

## 2024-12-30 MED ORDER — FOLIC ACID 1 MG PO TABS
1.0000 mg | ORAL_TABLET | Freq: Every day | ORAL | Status: DC
  Administered 2024-12-31 – 2025-01-02 (×3): 1 mg via ORAL
  Filled 2024-12-30 (×3): qty 1

## 2024-12-30 MED ORDER — ACETAMINOPHEN 650 MG RE SUPP: 650.0000 mg | Freq: Four times a day (QID) | RECTAL | Status: DC | PRN

## 2024-12-30 MED ORDER — SODIUM CHLORIDE 0.9 % IV SOLN
500.0000 mg | INTRAVENOUS | Status: AC
  Administered 2024-12-30: 500 mg via INTRAVENOUS
  Filled 2024-12-30: qty 5

## 2024-12-30 MED ORDER — PANTOPRAZOLE SODIUM 40 MG PO TBEC
40.0000 mg | DELAYED_RELEASE_TABLET | Freq: Every day | ORAL | Status: DC
  Administered 2024-12-31 – 2025-01-02 (×3): 40 mg via ORAL
  Filled 2024-12-30 (×3): qty 1

## 2024-12-30 MED ORDER — POLYETHYLENE GLYCOL 3350 17 G PO PACK: 17.0000 g | PACK | Freq: Every day | ORAL | Status: DC | PRN

## 2024-12-30 MED ORDER — METHYLPREDNISOLONE SODIUM SUCC 40 MG IJ SOLR
40.0000 mg | Freq: Two times a day (BID) | INTRAMUSCULAR | Status: AC
  Administered 2024-12-30 – 2024-12-31 (×2): 40 mg via INTRAVENOUS
  Filled 2024-12-30 (×2): qty 1

## 2024-12-30 MED ORDER — HYDROXYZINE HCL 10 MG PO TABS
10.0000 mg | ORAL_TABLET | Freq: Three times a day (TID) | ORAL | Status: DC | PRN
  Administered 2024-12-30 – 2025-01-01 (×4): 10 mg via ORAL
  Filled 2024-12-30 (×4): qty 1

## 2024-12-30 MED ORDER — ENOXAPARIN SODIUM 30 MG/0.3ML IJ SOSY
30.0000 mg | PREFILLED_SYRINGE | INTRAMUSCULAR | Status: DC
  Administered 2024-12-30 – 2025-01-01 (×3): 30 mg via SUBCUTANEOUS
  Filled 2024-12-30 (×3): qty 0.3

## 2024-12-30 MED ORDER — BUDESON-GLYCOPYRROL-FORMOTEROL 160-9-4.8 MCG/ACT IN AERO
2.0000 | INHALATION_SPRAY | Freq: Two times a day (BID) | RESPIRATORY_TRACT | Status: DC
  Administered 2024-12-31 – 2025-01-02 (×5): 2 via RESPIRATORY_TRACT
  Filled 2024-12-30: qty 5.9

## 2024-12-30 MED ORDER — SODIUM CHLORIDE 0.9 % IV SOLN
1.0000 g | Freq: Once | INTRAVENOUS | Status: AC
  Administered 2024-12-30: 1 g via INTRAVENOUS
  Filled 2024-12-30: qty 10

## 2024-12-30 MED ORDER — ACETAMINOPHEN 325 MG PO TABS
650.0000 mg | ORAL_TABLET | Freq: Four times a day (QID) | ORAL | Status: DC | PRN
  Administered 2024-12-31: 650 mg via ORAL
  Filled 2024-12-30: qty 2

## 2024-12-30 MED ORDER — ONDANSETRON 4 MG PO TBDP
4.0000 mg | ORAL_TABLET | Freq: Three times a day (TID) | ORAL | Status: DC | PRN
  Administered 2024-12-31 – 2025-01-01 (×2): 4 mg via ORAL
  Filled 2024-12-30 (×2): qty 1

## 2024-12-30 MED ORDER — CLONAZEPAM 0.5 MG PO TABS: 0.5000 mg | ORAL_TABLET | Freq: Two times a day (BID) | ORAL | Status: DC | PRN

## 2024-12-30 MED ORDER — FLUTICASONE PROPIONATE 50 MCG/ACT NA SUSP
1.0000 | Freq: Every day | NASAL | Status: DC
  Administered 2024-12-31 – 2025-01-02 (×3): 1 via NASAL
  Filled 2024-12-30: qty 16

## 2024-12-30 MED ORDER — METHYLPREDNISOLONE SODIUM SUCC 125 MG IJ SOLR
125.0000 mg | Freq: Once | INTRAMUSCULAR | Status: AC
  Administered 2024-12-30: 125 mg via INTRAVENOUS
  Filled 2024-12-30: qty 2

## 2024-12-30 MED ORDER — ALBUTEROL SULFATE (2.5 MG/3ML) 0.083% IN NEBU: 10.0000 mg | INHALATION_SOLUTION | Freq: Once | RESPIRATORY_TRACT | Status: DC

## 2024-12-30 MED ORDER — DICYCLOMINE HCL 10 MG PO CAPS
10.0000 mg | ORAL_CAPSULE | Freq: Three times a day (TID) | ORAL | Status: DC
  Administered 2024-12-30 – 2025-01-02 (×10): 10 mg via ORAL
  Filled 2024-12-30 (×10): qty 1

## 2024-12-30 MED ORDER — SODIUM CHLORIDE 0.9% FLUSH
3.0000 mL | Freq: Two times a day (BID) | INTRAVENOUS | Status: DC
  Administered 2024-12-30 – 2025-01-01 (×5): 3 mL via INTRAVENOUS

## 2024-12-30 MED ORDER — PREDNISONE 20 MG PO TABS
40.0000 mg | ORAL_TABLET | Freq: Every day | ORAL | Status: DC
  Administered 2025-01-01 – 2025-01-02 (×2): 40 mg via ORAL
  Filled 2024-12-30 (×2): qty 2

## 2024-12-30 MED ORDER — MAGNESIUM SULFATE 50 % IJ SOLN: 1.0000 g | Freq: Once | INTRAMUSCULAR | Status: DC

## 2024-12-30 MED ORDER — FAMOTIDINE 20 MG PO TABS
40.0000 mg | ORAL_TABLET | Freq: Every day | ORAL | Status: DC
  Administered 2024-12-30 – 2025-01-01 (×3): 40 mg via ORAL
  Filled 2024-12-30 (×3): qty 2

## 2024-12-30 MED ORDER — SODIUM CHLORIDE 0.9% FLUSH: 3.0000 mL | INTRAVENOUS | Status: DC | PRN

## 2024-12-30 MED ORDER — IPRATROPIUM-ALBUTEROL 0.5-2.5 (3) MG/3ML IN SOLN
3.0000 mL | Freq: Once | RESPIRATORY_TRACT | Status: AC
  Administered 2024-12-30: 3 mL via RESPIRATORY_TRACT
  Filled 2024-12-30: qty 3

## 2024-12-30 NOTE — H&P (Signed)
 " History and Physical    Patient: Abigail Lamb FMW:[id number] DOB: 10/05/1951 DOA: 12/30/2024 DOS: the patient was seen and examined on 12/30/2024 PCP: Joshua Debby CROME, MD  Patient coming from: Home  Chief Complaint:  Chief Complaint  Patient presents with   Shortness of Breath   HPI:  74 year old woman PMH including emphysema, COPD, smoker, presented with shortness of breath for the last several days, fatigue, chills.  Not responsive to inhaler.  Noted to have SpO2 89% on room air per EMS.  COVID, flu, RSV negative.  CT chest no PE.  Admitted for COPD exacerbation, acute hypoxic respiratory failure.  Patient reports about 5 days of increased shortness of breath and cough.  Smoker but would like to quit, intends to quit as of today.  Motivated to return home to see her grandson.  Multiple other complaints including ambulatory dysfunction with multiple falls.  Receiving physical therapy at home already.  Lives with her son, his girlfriend and their child.  Also reports bilateral lower extremity neuropathy and wounds of the left toes, followed by Dr. Gershon.  Review of Systems:  Multiple chronic complaints including blurred vision and sometimes, recent sore throat from endoscopy, chronic skin problems, chronic muscular complaints, chest pain from anxiety, chronic nausea  Past Medical History:  Diagnosis Date   Arthritis    Celiac disease    COPD (chronic obstructive pulmonary disease) (HCC)    Encephalopathy    As teenager   Gastroparesis    GERD (gastroesophageal reflux disease)    Hyperlipidemia    Hypertension    IBS (irritable bowel syndrome)    MGUS (monoclonal gammopathy of unknown significance)    per patient    MGUS (monoclonal gammopathy of unknown significance)    Neurodermatitis    Osteoporosis    per patient, diagnosed by Dr. [facility] in [facility]   PUD (peptic ulcer disease)    Pulmonary emphysema (HCC)    Recurrent depression    Rheumatoid arteritis (HCC)     Smoker    Past Surgical History:  Procedure Laterality Date   ABCESS DRAINAGE Left    under left arm   COLONOSCOPY     COLONOSCOPY  07/07/2024   spinal tap     age 74   TONSILLECTOMY  71   Social History:  reports that she has been smoking cigarettes. She has a 36.8 pack-year smoking history. She has been exposed to tobacco smoke. She has never used smokeless tobacco. She reports that she does not currently use alcohol. She reports that she does not currently use drugs.  Allergies[1]  Family History  Problem Relation Age of Onset   CVA Mother    CAD Mother    Diabetes Mother    COPD Mother    Fibromyalgia Mother    Depression Mother    Hypertension Mother    Emphysema Mother        smoked   Heart attack Mother    Prostate cancer Father    Cancer - Prostate Father    Rheum arthritis Sister    Skin cancer Brother    Kidney Stones Brother    Hernia Brother    Depression Daughter    Healthy Son    Healthy Son    Colon polyps Neg Hx    Colon cancer Neg Hx    Esophageal cancer Neg Hx    Stomach cancer Neg Hx    Rectal cancer Neg Hx     Prior to Admission medications  Medication  Sig Start Date End Date Taking? Authorizing Provider  albuterol (VENTOLIN HFA) 108 (90 Base) MCG/ACT inhaler Take 1-2 puffs every 4-6 hrs prn shortness of breath. Needs ventolin. 09/07/18   [provider]  budesonide-glycopyrrolate-formoterol (BREZTRI  AEROSPHERE) 160-9-4.8 MCG/ACT AERO inhaler Inhale 2 puffs into the lungs in the morning and at bedtime. 10/14/24   Shelah Lamar RAMAN, MD  celecoxib  (CELEBREX ) 50 MG capsule TAKE 1 CAPSULE BY MOUTH 2 TIMES DAILY. 11/28/24   Joshua Debby CROME, MD  Cholecalciferol (VITAMIN D3) 125 MCG (5000 UT) CAPS Take 5,000 Units by mouth daily.    [provider]  ciclopirox  (PENLAC ) 8 % solution Apply topically at bedtime. Apply over nail and surrounding skin. Apply daily over previous coat. After seven (7) days, may remove with alcohol and continue  cycle. 02/29/24   Gershon Donnice SAUNDERS, DPM  clindamycin (CLEOCIN T) 1 % external solution as needed.  01/20/20   [provider]  clobetasol (OLUX) 0.05 % topical foam Apply topically as needed. 01/20/20   [provider]  clonazePAM  (KLONOPIN ) 0.5 MG tablet Take 1 tablet (0.5 mg total) by mouth 2 (two) times daily as needed for anxiety. 12/17/24   Joshua Debby CROME, MD  colesevelam  (WELCHOL ) 625 MG tablet TAKE 3 TABLETS BY MOUTH TWICE A DAY WITH MEAL 09/22/24   Joshua Debby CROME, MD  dicyclomine  (BENTYL ) 10 MG capsule TAKE 1 CAPSULE (10 MG TOTAL) BY MOUTH 4 TIMES A DAY BEFORE MEALS AND AT BEDTIME 09/16/24   Joshua Debby CROME, MD  DULoxetine  (CYMBALTA ) 30 MG capsule TAKE 3 CAPSULES (90 MG TOTAL) BY MOUTH DAILY. FOR TOTAL OF 90 MG DAILY 08/31/24   Joshua Debby CROME, MD  famotidine  (PEPCID ) 40 MG tablet TAKE 1 TABLET BY MOUTH EVERYDAY AT BEDTIME 12/15/24   Collier, Amanda R, PA-C  fluocinonide -emollient (LIDEX -E) 0.05 % cream Apply 1 application topically 2 (two) times daily. Patient taking differently: Apply 1 application  topically as needed. 11/05/20   Joshua Debby CROME, MD  fluticasone  (FLONASE ) 50 MCG/ACT nasal spray Place 1 spray into both nostrils daily. 08/19/22   Gladis Leonor HERO, MD  folic acid (FOLVITE) 1 MG tablet Take by mouth. 02/28/19   [provider]  hydroxychloroquine  (PLAQUENIL ) 200 MG tablet Take 1 tablet (200 mg total) by mouth daily. 11/17/24   Cheryl Waddell HERO, PA-C  mupirocin  ointment (BACTROBAN ) 2 % Apply 1 Application topically 2 (two) times daily. 02/29/24   Gershon Donnice SAUNDERS, DPM  ondansetron  (ZOFRAN -ODT) 4 MG disintegrating tablet Take 1 tablet (4 mg total) by mouth every 8 (eight) hours as needed for nausea or vomiting. 04/20/24   Joshua Debby CROME, MD  pantoprazole  (PROTONIX ) 40 MG tablet TAKE 1 TABLET BY MOUTH EVERY DAY 08/04/24   Joshua Debby CROME, MD  sulfaSALAzine  (AZULFIDINE ) 500 MG tablet Take 2 tablets (1,000 mg total) by mouth 2 (two) times daily. 11/17/24   Cheryl Waddell HERO, PA-C  Zoledronic  Acid (RECLAST  IV) Inject into the vein. Infusion: 12/03/2023    [provider]    Physical Exam: Vitals:   12/30/24 1035 12/30/24 1738  BP: (!) 148/68 121/65  Pulse: 100 91  Resp: 20 20  Temp: 98 F (36.7 C) 97.9 F (36.6 C)  TempSrc: Oral Oral  SpO2: 96% 97%   Physical Exam Vitals reviewed.  Constitutional:      General: She is not in acute distress.    Appearance: She is ill-appearing (chronically). She is not toxic-appearing.  Cardiovascular:     Rate and Rhythm: Normal  rate and regular rhythm.     Heart sounds: No murmur heard. Pulmonary:     Effort: Pulmonary effort is normal. No respiratory distress.     Breath sounds: No wheezing, rhonchi or rales.  Abdominal:     General: There is no distension.     Palpations: Abdomen is soft.     Tenderness: There is no abdominal tenderness.  Musculoskeletal:     Right lower leg: No edema.     Left lower leg: No edema.  Neurological:     Mental Status: She is alert.  Psychiatric:        Mood and Affect: Mood normal.        Behavior: Behavior normal.     Data Reviewed: BMP unremarkable WBC elevated 19.2, 9.5 December 31 D-dimer elevated 5.58 Influenza, RSV, COVID-negative Abnormal urinalysis Chest x-ray hyperinflated lungs, no acute infiltrate CTA chest no PE.  Emphysema predominantly upper lobes.  Hyperinflation of lungs. EKG independently reviewed showed sinus rhythm  Assessment and Plan: Acute hypoxic respiratory failure COPD exacerbation Emphysema Smoker Supplemental oxygen, wean as tolerated.  Bronchodilators, steroids, antibiotics.  Leukocytosis noted but she has no signs or symptoms to suggest infection and her CTA chest showed no infiltrate.  Monitor clinically. Stop smoking.  Does not want nicotine patch.  Chronic ambulatory dysfunction Multiple falls at home, already receiving physical therapy at home  Rheumatoid arthritis   Irritable bowel syndrome Continue  chronic regimen  Advance Care Planning: Full code confirmed with patient  Consults: none  Family Communication: none present or requestd  Severity of Illness: The appropriate patient status for this patient is OBSERVATION. Observation status is judged to be reasonable and necessary in order to provide the required intensity of service to ensure the patient's safety. The patient's presenting symptoms, physical exam findings, and initial radiographic and laboratory data in the context of their medical condition is felt to place them at decreased risk for further clinical deterioration. Furthermore, it is anticipated that the patient will be medically stable for discharge from the hospital within 2 midnights of admission.   Author: Toribio Door, MD 12/30/2024 6:36 PM  For on call review [email or web address].     [1]  Allergies Allergen Reactions   Dust Mite Extract Other (See Comments)    Multiple environmental allergies.    "

## 2024-12-30 NOTE — ED Provider Notes (Signed)
 " [facility] AT [facility] Provider Note   CSN: [id number] Arrival date & time: 12/30/24  1025     Patient presents with: Shortness of Breath   Abigail Lamb is a 74 y.o. female.   The history is provided by the patient, the EMS personnel and medical records. No language interpreter was used.  Shortness of Breath    74 year old female with history of heavy tobacco use, pulmonary emphysema, COPD, brought here via EMS from home with complaints of shortness of breath.  Patient states for the past 5 days she has had progressive worsening shortness of breath.  Shortness of breath seems to be brought on with any activities and short walk.  She endorsed having persistent cough, having some chills, generalized fatigue and decrease in appetite.  She does not endorse any fever but does endorse having persistent nausea.  She is using her rescue inhaler at least 4 times a day as well as her other breathing treatment.  She is a smoker and states she is actively trying to quit.  She denies any hemoptysis, abdominal pain, dysuria.  She does not wear oxygen.  Per EMS, patient was noted to have an O2 sats of 89% on room air and therefore patient was given 1 L of nasal cannula for comfort.  Prior to Admission medications  Medication Sig Start Date End Date Taking? Authorizing Provider  albuterol  (VENTOLIN  HFA) 108 (90 Base) MCG/ACT inhaler Take 1-2 puffs every 4-6 hrs prn shortness of breath. Needs ventolin . 09/07/18   [provider]  budesonide -glycopyrrolate -formoterol  (BREZTRI  AEROSPHERE) 160-9-4.8 MCG/ACT AERO inhaler Inhale 2 puffs into the lungs in the morning and at bedtime. 10/14/24   Shelah Lamar RAMAN, MD  celecoxib  (CELEBREX ) 50 MG capsule TAKE 1 CAPSULE BY MOUTH 2 TIMES DAILY. 11/28/24   Joshua Debby CROME, MD  Cholecalciferol (VITAMIN D3) 125 MCG (5000 UT) CAPS Take 5,000 Units by mouth daily.    [provider]  ciclopirox  (PENLAC ) 8 % solution Apply  topically at bedtime. Apply over nail and surrounding skin. Apply daily over previous coat. After seven (7) days, may remove with alcohol and continue cycle. 02/29/24   Gershon Donnice SAUNDERS, DPM  clindamycin (CLEOCIN T) 1 % external solution as needed.  01/20/20   [provider]  clobetasol (OLUX) 0.05 % topical foam Apply topically as needed. 01/20/20   [provider]  clonazePAM  (KLONOPIN ) 0.5 MG tablet Take 1 tablet (0.5 mg total) by mouth 2 (two) times daily as needed for anxiety. 12/17/24   Joshua Debby CROME, MD  colesevelam  (WELCHOL ) 625 MG tablet TAKE 3 TABLETS BY MOUTH TWICE A DAY WITH MEAL 09/22/24   Joshua Debby CROME, MD  dicyclomine  (BENTYL ) 10 MG capsule TAKE 1 CAPSULE (10 MG TOTAL) BY MOUTH 4 TIMES A DAY BEFORE MEALS AND AT BEDTIME 09/16/24   Joshua Debby CROME, MD  DULoxetine  (CYMBALTA ) 30 MG capsule TAKE 3 CAPSULES (90 MG TOTAL) BY MOUTH DAILY. FOR TOTAL OF 90 MG DAILY 08/31/24   Joshua Debby CROME, MD  famotidine  (PEPCID ) 40 MG tablet TAKE 1 TABLET BY MOUTH EVERYDAY AT BEDTIME 12/15/24   Collier, Amanda R, PA-C  fluocinonide -emollient (LIDEX -E) 0.05 % cream Apply 1 application topically 2 (two) times daily. Patient taking differently: Apply 1 application  topically as needed. 11/05/20   Joshua Debby CROME, MD  fluticasone  (FLONASE ) 50 MCG/ACT nasal spray Place 1 spray into both nostrils daily. 08/19/22   Gladis Leonor HERO, MD  folic acid  (FOLVITE ) 1 MG tablet  Take by mouth. 02/28/19   [provider]  hydroxychloroquine  (PLAQUENIL ) 200 MG tablet Take 1 tablet (200 mg total) by mouth daily. 11/17/24   Cheryl Waddell HERO, PA-C  mupirocin  ointment (BACTROBAN ) 2 % Apply 1 Application topically 2 (two) times daily. 02/29/24   Gershon Donnice SAUNDERS, DPM  ondansetron  (ZOFRAN -ODT) 4 MG disintegrating tablet Take 1 tablet (4 mg total) by mouth every 8 (eight) hours as needed for nausea or vomiting. 04/20/24   Joshua Debby CROME, MD  pantoprazole  (PROTONIX ) 40 MG tablet TAKE 1 TABLET BY MOUTH EVERY DAY  08/04/24   Joshua Debby CROME, MD  sulfaSALAzine  (AZULFIDINE ) 500 MG tablet Take 2 tablets (1,000 mg total) by mouth 2 (two) times daily. 11/17/24   Cheryl Waddell HERO, PA-C  Zoledronic  Acid (RECLAST  IV) Inject into the vein. Infusion: 12/03/2023    [provider]    Allergies: Dust mite extract    Review of Systems  Respiratory:  Positive for shortness of breath.   All other systems reviewed and are negative.   Updated Vital Signs BP (!) 148/68 (BP Location: Right Arm)   Pulse 100   Temp 98 F (36.7 C) (Oral)   Resp 20   SpO2 96%   Physical Exam Vitals and nursing note reviewed.  Constitutional:      General: She is not in acute distress.    Appearance: She is well-developed.     Comments: [name] female who does not appear to be in any acute respiratory discomfort.  She is able to speak in complete sentences.  HENT:     Head: Atraumatic.     Mouth/Throat:     Comments: Mouth is dry Eyes:     Conjunctiva/sclera: Conjunctivae normal.  Cardiovascular:     Rate and Rhythm: Tachycardia present.     Pulses: Normal pulses.     Heart sounds: Normal heart sounds.  Pulmonary:     Effort: Pulmonary effort is normal.     Breath sounds: Wheezing present.     Comments: Diminished breath sounds with faint wheezes Musculoskeletal:     Cervical back: Neck supple.     Right lower leg: No edema.     Left lower leg: No edema.  Skin:    Findings: No rash.  Neurological:     Mental Status: She is alert.  Psychiatric:        Mood and Affect: Mood normal.     (all labs ordered are listed, but only abnormal results are displayed) Labs Reviewed  BASIC METABOLIC PANEL WITH GFR - Abnormal; Notable for the following components:      Result Value   Glucose, Bld 122 (*)    All other components within normal limits  CBC - Abnormal; Notable for the following components:   WBC 19.2 (*)    HCT 47.0 (*)    Platelets 84 (*)    All other components within normal limits   URINALYSIS, ROUTINE W REFLEX MICROSCOPIC - Abnormal; Notable for the following components:   Color, Urine AMBER (*)    APPearance HAZY (*)    Protein, ur 100 (*)    Leukocytes,Ua LARGE (*)    Bacteria, UA RARE (*)    All other components within normal limits  D-DIMER, QUANTITATIVE - Abnormal; Notable for the following components:   D-Dimer, Quant 5.58 (*)    All other components within normal limits  RESP PANEL BY RT-PCR (RSV, FLU A&B, COVID)  [id number]    EKG: None ED ECG REPORT  Date: 12/30/2024  Rate: 64  Rhythm: normal sinus rhythm  QRS Axis: normal  Intervals: normal  ST/T Wave abnormalities: normal  Conduction Disutrbances:nonspecific intraventricular conduction delay  Narrative Interpretation:   Old EKG Reviewed: unchanged  I have personally reviewed the EKG tracing and agree with the computerized printout as noted.   Radiology: CT Angio Chest PE W and/or Wo Contrast Result Date: 12/30/2024 CLINICAL DATA:  Shortness of breath, positive D-dimer EXAM: CT ANGIOGRAPHY CHEST WITH CONTRAST TECHNIQUE: Multidetector CT imaging of the chest was performed using the standard protocol during bolus administration of intravenous contrast. Multiplanar CT image reconstructions and MIPs were obtained to evaluate the vascular anatomy. RADIATION DOSE REDUCTION: This exam was performed according to the departmental dose-optimization program which includes automated exposure control, adjustment of the mA and/or kV according to patient size and/or use of iterative reconstruction technique. CONTRAST:  75mL OMNIPAQUE  IOHEXOL  350 MG/ML SOLN COMPARISON:  December 02, 2023 FINDINGS: Cardiovascular: Satisfactory opacification of the pulmonary arteries to the segmental level. No evidence of pulmonary embolism. Normal heart size. No pericardial effusion. Mediastinum/Nodes: No enlarged mediastinal, hilar, or axillary lymph nodes. Thyroid  gland, trachea, and esophagus demonstrate no significant findings.  Lungs/Pleura: No pneumothorax or pleural effusion is noted. Emphysematous disease is noted predominantly in the upper lobes. Hyperinflation of the lungs is noted. No pulmonary consolidation is noted. Upper Abdomen: No acute abnormality. Musculoskeletal: No chest wall abnormality. No acute or significant osseous findings. Review of the MIP images confirms the above findings. IMPRESSION: 1. No definite evidence of pulmonary embolus. 2. Emphysematous disease is noted predominantly in the upper lobes. Hyperinflation of the lungs is noted. Emphysema (ICD10-J43.9). Electronically Signed   By: Lynwood Landy Raddle M.D.   On: 12/30/2024 15:45   DG Chest 2 View Result Date: 12/30/2024 CLINICAL DATA:  Shortness of breath. EXAM: CHEST - 2 VIEW COMPARISON:  12/28/2024. FINDINGS: The heart size and mediastinal contours are unchanged. Emphysema. No focal consolidation, pleural effusion, or pneumothorax. No acute osseous abnormality. IMPRESSION: 1. No acute cardiopulmonary findings. 2. Emphysema. Electronically Signed   By: Harrietta Sherry M.D.   On: 12/30/2024 12:31   DG Chest 2 View Result Date: 12/28/2024 CLINICAL DATA:  Shortness of breath and cough. EXAM: CHEST - 2 VIEW COMPARISON:  Chest radiograph dated 03/22/2024. FINDINGS: Background of emphysema. No focal consolidation, pleural effusion, pneumothorax. The cardiac silhouette is within normal limits. No acute osseous pathology. IMPRESSION: No active cardiopulmonary disease. Emphysema. Electronically Signed   By: Vanetta Chou M.D.   On: 12/28/2024 18:50     .Critical Care  Performed by: Nivia Colon, PA-C Authorized by: Nivia Colon, PA-C   Critical care provider statement:    Critical care time (minutes):  30   Critical care was time spent personally by me on the following activities:  Development of treatment plan with patient or surrogate, discussions with consultants, evaluation of patient's response to treatment, examination of patient, ordering and  review of laboratory studies, ordering and review of radiographic studies, ordering and performing treatments and interventions, pulse oximetry, re-evaluation of patient's condition and review of old charts    Medications Ordered in the ED  albuterol  (PROVENTIL ) (2.5 MG/3ML) 0.083% nebulizer solution 10 mg (has no administration in time range)  ipratropium-albuterol  (DUONEB) 0.5-2.5 (3) MG/3ML nebulizer solution 3 mL (3 mLs Nebulization Given 12/30/24 1312)  methylPREDNISolone  sodium succinate (SOLU-MEDROL ) 125 mg/2 mL injection 125 mg (125 mg Intravenous Given 12/30/24 1311)  magnesium  sulfate IVPB 1 g 100 mL (0 g Intravenous Stopped 12/30/24 1430)  iohexol  (OMNIPAQUE ) 350 MG/ML injection 75 mL (75 mLs Intravenous Contrast Given 12/30/24 1437)                                    Medical Decision Making Amount and/or Complexity of Data Reviewed Labs: ordered. Radiology: ordered.  Risk Prescription drug management.   BP (!) 148/68 (BP Location: Right Arm)   Pulse 100   Temp 98 F (36.7 C) (Oral)   Resp 20   SpO2 96%   24:50 PM  74 year old female with history of heavy tobacco use, pulmonary emphysema, COPD, brought here via EMS from home with complaints of shortness of breath.  Patient states for the past 5 days she has had progressive worsening shortness of breath.  Shortness of breath seems to be brought on with any activities and short walk.  She endorsed having persistent cough, having some chills, generalized fatigue and decrease in appetite.  She does not endorse any fever but does endorse having persistent nausea.  She is using her rescue inhaler at least 4 times a day as well as her other breathing treatment.  She is a smoker and states she is actively trying to quit.  She denies any hemoptysis, abdominal pain, dysuria.  She does not wear oxygen.  Per EMS, patient was noted to have an O2 sats of 89% on room air and therefore patient was given 1 L of nasal cannula for comfort.  On exam,  this is a chronically frail appearing female laying in bed appears to be in no acute respiratory discomfort.  She is wearing supplemental oxygen at 1 L.  She does have some diminished breath sounds with some faint wheezes heard.  She does not appear to be fluid overloaded.  She is maintaining appropriate mentation and able to speak in complete sentences.  -Labs ordered, independently viewed and interpreted by me.  Labs remarkable for elevated BMI of 5.58, will obtain chest CT angiogram to rule out PE.  Urinalysis shows large leukocyte esterase as well as greater than 50 WBC.  Since patient have an elevated white count and also endorsed having some chills and generalized fatigue and decreased appetite, will treat for suspected UTI with Rocephin .  She is without any urinary complaints at this time.  Patient tested negative for COVID, flu, RSV.  Her white count is 19.2 -The patient was maintained on a cardiac monitor.  I personally viewed and interpreted the cardiac monitored which showed an underlying rhythm of: Sinus tachycardia -Imaging independently viewed and interpreted by me and I agree with radiologist's interpretation.  Result remarkable for chest CT angiogram showed no evidence of PE.  Patient does have emphysematous disease and hyperinflation of the lungs.  He will continue with breathing treatment. -This patient presents to the ED for concern of shortness of breath, this involves an extensive number of treatment options, and is a complaint that carries with it a high risk of complications and morbidity.  The differential diagnosis includes viral illness, asthma exacerbation, COPD exacerbation, PE, pleurisy, CHF, PTX, anemia -Co morbidities that complicate the patient evaluation includes tobacco use, emphysema, COPD -Treatment includes Solu-Medrol , magnesium , DuoNeb, Rocephin  -Reevaluation of the patient after these medicines showed that the patient improved -PCP office notes or outside notes  reviewed -Discussion with specialist [facility] Dr. Jadine who agrees to see and will admit pt for acute respiratory failure with hypoxia 2/2 COPD exacerbation -Escalation to admission/observation considered: patient  will be admitted.      Final diagnoses:  Acute respiratory failure with hypoxia (HCC)  COPD exacerbation (HCC)  Acute lower UTI    ED Discharge Orders     None          Nivia Colon, PA-C 12/30/24 1801    Neysa Caron PARAS, DO 01/01/25 [id number]  "

## 2024-12-30 NOTE — ED Triage Notes (Signed)
 BIB EMS from home for SOB and congestion. Given a neb - patient was at 89% on room air and does not wear O2. Does regularly smoke.  20g PIV LAC, given 500cc LR. Patient 97% on RA, given 1L [place] for comfort.

## 2024-12-30 NOTE — ED Notes (Signed)
 Patient transported to X-ray

## 2024-12-30 NOTE — Hospital Course (Addendum)
 74 year old woman PMH including emphysema, COPD, smoker, presented with shortness of breath for the last several days, fatigue, chills.  Not responsive to inhaler.  Noted to have SpO2 89% on room air per EMS.  COVID, flu, RSV negative.  CT chest no PE.  Consultants None   Procedures/Events 1/2 admit for acute hypoxia, COPD exacerbation

## 2024-12-31 ENCOUNTER — Other Ambulatory Visit: Payer: Self-pay

## 2024-12-31 DIAGNOSIS — J9601 Acute respiratory failure with hypoxia: Secondary | ICD-10-CM | POA: Diagnosis not present

## 2024-12-31 DIAGNOSIS — J441 Chronic obstructive pulmonary disease with (acute) exacerbation: Secondary | ICD-10-CM | POA: Diagnosis not present

## 2024-12-31 DIAGNOSIS — F17210 Nicotine dependence, cigarettes, uncomplicated: Secondary | ICD-10-CM | POA: Diagnosis not present

## 2024-12-31 MED ORDER — CELECOXIB 100 MG PO CAPS
100.0000 mg | ORAL_CAPSULE | Freq: Two times a day (BID) | ORAL | Status: DC
  Administered 2024-12-31 – 2025-01-02 (×5): 100 mg via ORAL
  Filled 2024-12-31 (×5): qty 1

## 2024-12-31 MED ORDER — ALBUTEROL SULFATE (2.5 MG/3ML) 0.083% IN NEBU
2.5000 mg | INHALATION_SOLUTION | RESPIRATORY_TRACT | Status: DC | PRN
  Filled 2024-12-31: qty 3

## 2024-12-31 NOTE — Evaluation (Signed)
 Physical Therapy Evaluation Patient Details Name: Abigail Lamb MRN: [id number] DOB: 03-Feb-1951 Today's Date: 12/31/2024  History of Present Illness  Abigail Lamb is a 74 y.o. female presents with complaints of shortness of breath; 12/30/24 admitted for COPD exacerbation, acute hypoxic respiratory failure. PMH:  heavy tobacco use, pulmonary emphysema, COPD, IBS, osteoporosis  Clinical Impression  Pt admitted with above diagnosis. PTA, pt reports ind with self care, lives in 2nd floor apartment that family assists pt up/down stairs at baseline, family provides transportation and completes household chores, pt reports supv with RW, endorses unsteadiness with multiple falls over the past 2 months with family assisting her back up, active with HH PT and OT, not on home O2. On eval, pt with generalized weakness, needing CGA to mod A with transfers and ambulation while using RW. Pt generally unsteady with RW, while standing at bathroom sink pt with posterior LOB requiring mod A to prevent fall backwards, pt heavily reliant on BUE support. Pt then amb out into hallway, reports shortness of breath and fatigue with distance so return to room, requiring min to mod A for steadying, vitals stable on RA and pt reports improvement in symptoms with seated rest. On RA throughout eval, SpO2 >92%, though noted to have dyspnea and coughing at times- notified RN. Recommend resume HHPT services and continued 24/7 assist from family and pt verbalizes agreement.  Pt currently with functional limitations due to the deficits listed below (see PT Problem List). Pt will benefit from acute skilled PT to increase their independence and safety with mobility to allow discharge.           If plan is discharge home, recommend the following: A little help with walking and/or transfers;A little help with bathing/dressing/bathroom;Assistance with cooking/housework;Assist for transportation;Help with stairs or ramp for entrance   Can  travel by private vehicle        Equipment Recommendations None recommended by PT  Recommendations for Other Services       Functional Status Assessment Patient has had a recent decline in their functional status and demonstrates the ability to make significant improvements in function in a reasonable and predictable amount of time.     Precautions / Restrictions Precautions Precautions: Fall Recall of Precautions/Restrictions: Intact Restrictions Weight Bearing Restrictions Per Provider Order: No      Mobility  Bed Mobility Overal bed mobility: Needs Assistance Bed Mobility: Supine to Sit     Supine to sit: Supervision, HOB elevated, Used rails     General bed mobility comments: slight increased effort, using bedrail and elevated HOB to mobilize to bedside, cues as needed, SUPV    Transfers Overall transfer level: Needs assistance Equipment used: Rolling walker (2 wheels) Transfers: Sit to/from Stand Sit to Stand: Contact guard assist           General transfer comment: BUE assisting to power to stand from bed and toilet, slow to rise, reliant on BUE assisting to power up, BLE braced to assist    Ambulation/Gait Ambulation/Gait assistance: Min assist, Mod assist Gait Distance (Feet): 15 Feet (+30) Assistive device: Rolling walker (2 wheels) Gait Pattern/deviations: Decreased step length - right, Decreased step length - left, Shuffle, Ataxic, Trunk flexed, Narrow base of support, Drifts right/left Gait velocity: decreased     General Gait Details: forward flexed trunk, short bil steps with shuffling step progression, at times ataxic step progression and then at times appears more in control and normal, narrow BOS, min A for general unsteadiness but wtih distance pt  fatigues requiring mod A to steady; 15 ft to restroom then additional 30 ft out into hallway  Stairs            Wheelchair Mobility     Tilt Bed    Modified Rankin (Stroke Patients  Only)       Balance Overall balance assessment: Needs assistance, History of Falls Sitting-balance support: Feet supported Sitting balance-Leahy Scale: Fair Sitting balance - Comments: not challenged   Standing balance support: During functional activity, Bilateral upper extremity supported, Reliant on assistive device for balance Standing balance-Leahy Scale: Poor Standing balance comment: reliant on BUE support and external support                             Pertinent Vitals/Pain Pain Assessment Pain Assessment: Faces Faces Pain Scale: Hurts even more Pain Location: R knee, L hip, back Pain Descriptors / Indicators: Discomfort, Sore Pain Intervention(s): Limited activity within patient's tolerance, Monitored during session, Repositioned    Home Living Family/patient expects to be discharged to:: Private residence Living Arrangements: Children (son, his girlfriend, 7month old grandson) Available Help at Discharge: Family Type of Home: Apartment (2nd floor apartment) Home Access: Stairs to enter Entrance Stairs-Rails: Doctor, General Practice of Steps: flight   Home Layout: One level Home Equipment: Agricultural Consultant (2 wheels) Additional Comments: pt reports active with HH PT and OT services    Prior Function Prior Level of Function : Independent/Modified Independent;Needs assist;History of Falls (last six months)             Mobility Comments: pt reports using RW for household and limited community distances, has 24/7 supv from family (son, son's girlfriend, daughter) ADLs Comments: family provides transportation, ind with self care but needing increased assist for past ~2 months due to increased falls, family completes household chores     Extremity/Trunk Assessment   Upper Extremity Assessment Upper Extremity Assessment: Overall WFL for tasks assessed (finger ulnar drift deformity, reports numbness in hands)    Lower Extremity  Assessment Lower Extremity Assessment: Generalized weakness (AROM WFL, strength grossly 3+/5, reports numbness throughout bil feet)    Cervical / Trunk Assessment Cervical / Trunk Assessment: Kyphotic  Communication   Communication Communication: No apparent difficulties    Cognition Arousal: Alert Behavior During Therapy: WFL for tasks assessed/performed   PT - Cognitive impairments: No apparent impairments                         Following commands: Intact       Cueing       General Comments General comments (skin integrity, edema, etc.): Pt on RA with SpO2 97% at rest, SpO2 94-95% with ambulation, return to 96% when seated in recliner; pt reports fatigued with ambulation, HR noted 108 and BP 150/61 once return to sitting- notified RN    Exercises     Assessment/Plan    PT Assessment Patient needs continued PT services  PT Problem List Decreased strength;Decreased activity tolerance;Decreased balance;Decreased mobility;Decreased cognition;Decreased knowledge of use of DME;Decreased safety awareness;Cardiopulmonary status limiting activity;Pain       PT Treatment Interventions DME instruction;Gait training;Stair training;Functional mobility training;Therapeutic activities;Therapeutic exercise;Balance training;Neuromuscular re-education;Patient/family education    PT Goals (Current goals can be found in the Care Plan section)  Acute Rehab PT Goals Patient Stated Goal: return home with family support PT Goal Formulation: With patient Time For Goal Achievement: 01/14/25 Potential to Achieve Goals: Good  Frequency Min 3X/week     Co-evaluation               AM-PAC PT 6 Clicks Mobility  Outcome Measure Help needed turning from your back to your side while in a flat bed without using bedrails?: A Little Help needed moving from lying on your back to sitting on the side of a flat bed without using bedrails?: A Little Help needed moving to and from a  bed to a chair (including a wheelchair)?: A Little Help needed standing up from a chair using your arms (e.g., wheelchair or bedside chair)?: A Little Help needed to walk in hospital room?: A Lot Help needed climbing 3-5 steps with a railing? : A Lot 6 Click Score: 16    End of Session Equipment Utilized During Treatment: Gait belt;Oxygen Activity Tolerance: Patient tolerated treatment well Patient left: in chair;with call bell/phone within reach;with chair alarm set Nurse Communication: Mobility status;Other (comment) (SpO2) PT Visit Diagnosis: Unsteadiness on feet (R26.81);Muscle weakness (generalized) (M62.81);History of falling (Z91.81)    Time: 9099-9067 PT Time Calculation (min) (ACUTE ONLY): 32 min   Charges:   PT Evaluation $PT Eval Moderate Complexity: 1 Mod PT Treatments $Gait Training: 8-22 mins PT General Charges $$ ACUTE PT VISIT: 1 Visit         Tori Delaney Perona PT, DPT 12/31/2024, 9:48 AM

## 2024-12-31 NOTE — Progress Notes (Signed)
" °  Progress Note   Patient: Abigail Lamb FMW:[id number] DOB: 03/07/1951 DOA: 12/30/2024     0 DOS: the patient was seen and examined on 12/31/2024   Brief hospital course: 74 year old woman PMH including emphysema, COPD, smoker, presented with shortness of breath for the last several days, fatigue, chills.  Not responsive to inhaler.  Noted to have SpO2 89% on room air per EMS.  COVID, flu, RSV negative.  CT chest no PE.  Consultants None   Procedures/Events 1/2 admit for acute hypoxia, COPD exacerbation  Assessment and Plan: Acute hypoxic respiratory failure COPD exacerbation Emphysema Smoker Leukocytosis noted but she has no signs or symptoms to suggest infection and her CTA chest showed no infiltrate.  Stop smoking.  Does not want nicotine patch. Somewhat improved.  Wean oxygen as tolerated.  Continue bronchodilators, antibiotics, steroids.   Chronic ambulatory dysfunction Multiple falls at home, already receiving physical therapy at home Plan to resume home health on discharge   Rheumatoid arthritis     Irritable bowel syndrome Continue chronic regimen      Subjective:  Breathing a little bit better today.  Tolerating diet.  Physical Exam: Vitals:   12/30/24 2348 12/31/24 0404 12/31/24 0750 12/31/24 0752  BP:  (!) 128/59  123/75  Pulse:  83  84  Resp:    18  Temp:  97.8 F (36.6 C)  97.8 F (36.6 C)  TempSrc:  Oral  Oral  SpO2:  98% 100% 100%  Weight: 36.3 kg     Height: 5' 2 (1.575 m)      Physical Exam Vitals reviewed.  Constitutional:      General: She is not in acute distress.    Appearance: She is not ill-appearing or toxic-appearing.  Cardiovascular:     Rate and Rhythm: Normal rate and regular rhythm.     Heart sounds: No murmur heard. Pulmonary:     Effort: Pulmonary effort is normal. No respiratory distress.     Breath sounds: No wheezing, rhonchi or rales.  Neurological:     Mental Status: She is alert.  Psychiatric:        Mood and  Affect: Mood normal.        Behavior: Behavior normal.     Data Reviewed: No new data  Family Communication: none  Disposition: Status is: Observation      Time spent: 20 minutes  Author: Toribio Door, MD 12/31/2024 10:27 AM  For on call review [email or web address]    "

## 2024-12-31 NOTE — Care Management Obs Status (Signed)
 MEDICARE OBSERVATION STATUS NOTIFICATION   Patient Details  Name: Abigail Lamb MRN: [id number] Date of Birth: 1951-03-03   Medicare Observation Status Notification Given:  Yes    Sonda Manuella Quill, RN 12/31/2024, 4:48 PM

## 2024-12-31 NOTE — TOC Initial Note (Addendum)
 Transition of Care [facility]) - Initial/Assessment Note    Patient Details  Name: Abigail Lamb MRN: [id number] Date of Birth: 1951-04-23  [facility] CM/SW Contact:    Sonda Manuella Quill, RN Phone Number: 12/31/2024, 5:50 PM  Clinical Narrative:                 REcc for HHPT; spoke w/ pt in room; pt said she lives at home w/ her grandson and family; she plans to return w/ their support at d/c; pt identified POC dtr Rhiannon Nola [phone]); she will provide transportation; insurance/PCP verified; pt said she has difficulty paying food, utilities and gas; she denied other SDOH risks; she has walker; pt also said she has HHPT w/ [facility]; she would like to con't services w/ agency at d/c; pt does not have home oxygen; pt agreed to receive resources for social services and financial assistance; pt verbalized understanding she will make her own appt w/ agencies of choice; reosurces placed in d/c instructions; LVM for Amy Hyatt at [place] to see if pt active w/ agency; awaiting return call.  Expected Discharge Plan: Home w Home Health Services Barriers to Discharge: Continued Medical Work up   Patient Goals and CMS Choice Patient states their goals for this hospitalization and ongoing recovery are:: home     [place] ownership interest in [facility]provided to:: Patient    Expected Discharge Plan and Services   Discharge Planning Services: CM Consult   Living arrangements for the past 2 months: Apartment                 DME Arranged: N/A DME Agency: NA       HH Arranged: NA HH Agency: NA        Prior Living Arrangements/Services Living arrangements for the past 2 months: Apartment Lives with:: Relatives Patient language and need for interpreter reviewed:: Yes Do you feel safe going back to the place where you live?: Yes      Need for Family Participation in Patient Care: Yes (Comment) Care giver support system in place?: Yes (comment) Current  home services: DME (walker, HHPT w/ [email or web address]) Criminal Activity/Legal Involvement Pertinent to Current Situation/Hospitalization: No - Comment as needed  Activities of Daily Living   ADL Screening (condition at time of admission) Independently performs ADLs?: Yes (appropriate for developmental age) Is the patient deaf or have difficulty hearing?: No Does the patient have difficulty seeing, even when wearing glasses/contacts?: No Does the patient have difficulty concentrating, remembering, or making decisions?: No  Permission Sought/Granted Permission sought to share information with : Case Manager Permission granted to share information with : Yes, Verbal Permission Granted  Share Information with NAME: Case Manager     Permission granted to share info w Relationship: Rhiannon Nola (dtr) [phone]     Emotional Assessment Appearance:: Appears stated age Attitude/Demeanor/Rapport: Gracious Affect (typically observed): Accepting Orientation: : Oriented to Self, Oriented to Place, Oriented to  Time, Oriented to Situation Alcohol / Substance Use: Not Applicable Psych Involvement: No (comment)  Admission diagnosis:  COPD exacerbation (HCC) [J44.1] Acute lower UTI [N39.0] Acute respiratory failure with hypoxia (HCC) [J96.01] Patient Active Problem List   Diagnosis Date Noted   COPD exacerbation (HCC) 12/30/2024   Acute respiratory failure with hypoxia (HCC) 12/30/2024   Cervical stenosis of spinal canal 10/24/2024   Gait abnormality 09/22/2024   Risk for falls 09/22/2024   Ataxia 09/16/2024   Fall 09/16/2024   Diverticulitis 07/20/2024   Upper airway cough syndrome  07/14/2024   Need for immunization against influenza 11/17/2023   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 05/13/2023   Lung nodule 06/10/2022   Age-related osteoporosis without current pathological fracture 03/11/2022   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) 11/08/2021    Gastroparesis 11/07/2021   IBS (irritable bowel syndrome) 11/07/2021   Encounter for general adult medical examination with abnormal findings 11/07/2021   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   Visit for screening mammogram 08/02/2020   Rheumatoid arthritis involving multiple sites with positive rheumatoid factor (HCC) 08/02/2020   Hyperlipidemia LDL goal <130 08/02/2020   Tobacco abuse 08/02/2020   Dupuytren's contracture of right hand 08/02/2020   Pulmonary emphysema (HCC)    GOLD C COPD by GOLD classification (HCC) 01/30/2020   Cigarette smoker 01/30/2020   Neurodermatitis 03/28/2019   Primary insomnia 03/28/2019   Recurrent depression 03/28/2019   Rheumatoid arthritis involving multiple sites (HCC) 03/28/2019   PCP:  Joshua Debby CROME, MD Pharmacy:   [id number] [address] [place], [place] - [address] [place] [address] Phone: [phone] Fax: [phone]     Social Drivers of Health (SDOH) Social History: SDOH Screenings   Food Insecurity: Food Insecurity Present (12/31/2024)  Housing: Low Risk (12/31/2024)  Transportation Needs: No Transportation Needs (12/31/2024)  Recent Concern: Transportation Needs - Unmet Transportation Needs (12/31/2024)  Utilities: At Risk (12/31/2024)  Alcohol Screen: Low Risk (03/19/2023)  Depression (PHQ2-9): High Risk (09/16/2024)  Financial Resource Strain: High Risk (07/20/2024)  Physical Activity: Inactive (07/20/2024)  Social Connections: Moderately Isolated (12/31/2024)  Stress: Stress Concern Present (07/20/2024)  Tobacco Use: High Risk (12/30/2024)  Health Literacy: Adequate Health Literacy (01/21/2024)   SDOH Interventions: Food Insecurity Interventions: [employer] Provided, Inpatient TOC Housing Interventions: Intervention Not Indicated, Inpatient TOC Transportation Interventions: Intervention Not Indicated, Inpatient TOC Utilities Interventions: [employer] Provided, Inpatient TOC   Readmission Risk  Interventions     No data to display

## 2025-01-01 DIAGNOSIS — J9601 Acute respiratory failure with hypoxia: Secondary | ICD-10-CM | POA: Diagnosis not present

## 2025-01-01 DIAGNOSIS — J441 Chronic obstructive pulmonary disease with (acute) exacerbation: Secondary | ICD-10-CM | POA: Diagnosis not present

## 2025-01-01 NOTE — Progress Notes (Signed)
 Mobility Specialist - Progress Note  (RA) Pre-mobility: 90 bpm HR, 93% SpO2 During mobility: 115 bpm HR, 92% SpO2 Post-mobility: 97 bpm HR, 92% SPO2   01/01/25 1555  Mobility  Activity Ambulated with assistance  Level of Assistance Minimal assist, patient does 75% or more  Assistive Device Front wheel walker  Distance Ambulated (ft) 80 ft  Range of Motion/Exercises Active  Activity Response Tolerated fair  Mobility visit 1 Mobility  Mobility Specialist Start Time (ACUTE ONLY) 1535  Mobility Specialist Stop Time (ACUTE ONLY) 1555  Mobility Specialist Time Calculation (min) (ACUTE ONLY) 20 min   Pt was found in bed and agreeable to mobilize. Stated feeling fearful of falling and reassured pt. Grew fatigued towards EOS. Assisted to bathroom prior to returning to bed. Was left with all needs met. Call bell in reach.   Erminio Leos,  Mobility Specialist Can be reached via Secure Chat

## 2025-01-01 NOTE — Progress Notes (Signed)
" °  Progress Note   Patient: Abigail Lamb FMW:[id number] DOB: Dec 25, 1951 DOA: 12/30/2024     0 DOS: the patient was seen and examined on 01/01/2025   Brief hospital course: 74 year old woman PMH including emphysema, COPD, smoker, presented with shortness of breath for the last several days, fatigue, chills.  Not responsive to inhaler.  Noted to have SpO2 89% on room air per EMS.  COVID, flu, RSV negative.  CT chest no PE.  Consultants None   Procedures/Events 1/2 admit for acute hypoxia, COPD exacerbation  Assessment and Plan: Acute hypoxic respiratory failure COPD exacerbation Emphysema Smoker Leukocytosis noted but she has no signs or symptoms to suggest infection and her CTA chest showed no infiltrate.  Stop smoking.  Does not want nicotine patch. Slowly improving.  Has been successfully weaned off oxygen.  Continue bronchodilators, antibiotics, steroids.   Chronic ambulatory dysfunction Multiple falls at home, already receiving physical therapy at home Plan to resume home health on discharge   Rheumatoid arthritis     Irritable bowel syndrome Continue chronic regimen  Anticipate discharge home tomorrow    Subjective:  Somewhat better today, does get short of breath with movement  Physical Exam: Vitals:   12/31/24 1153 12/31/24 2036 12/31/24 2042 01/01/25 0522  BP: 137/71 122/60  125/60  Pulse: 84 76  76  Resp: 16 18  16   Temp: 98 F (36.7 C) 98.4 F (36.9 C)  97.9 F (36.6 C)  TempSrc:  Oral    SpO2: 97% 97% 96% 96%  Weight:      Height:       Physical Exam Vitals reviewed.  Constitutional:      General: She is not in acute distress.    Appearance: She is not ill-appearing or toxic-appearing.  Cardiovascular:     Rate and Rhythm: Normal rate and regular rhythm.     Heart sounds: No murmur heard. Pulmonary:     Effort: Pulmonary effort is normal. No respiratory distress.     Breath sounds: No wheezing, rhonchi or rales.  Neurological:     Mental  Status: She is alert.  Psychiatric:        Mood and Affect: Mood normal.        Behavior: Behavior normal.     Data Reviewed: No new data  Family Communication: None  Disposition: Status is: Observation      Time spent: 20 minutes  Author: Toribio Door, MD 01/01/2025 8:20 AM  For on call review [email or web address]    "

## 2025-01-02 ENCOUNTER — Telehealth: Payer: Self-pay | Admitting: Internal Medicine

## 2025-01-02 ENCOUNTER — Other Ambulatory Visit (HOSPITAL_COMMUNITY): Payer: Self-pay

## 2025-01-02 DIAGNOSIS — J9601 Acute respiratory failure with hypoxia: Secondary | ICD-10-CM | POA: Diagnosis not present

## 2025-01-02 DIAGNOSIS — J441 Chronic obstructive pulmonary disease with (acute) exacerbation: Secondary | ICD-10-CM | POA: Diagnosis not present

## 2025-01-02 MED ORDER — ALUM & MAG HYDROXIDE-SIMETH 200-200-20 MG/5ML PO SUSP
15.0000 mL | Freq: Four times a day (QID) | ORAL | Status: DC | PRN
  Administered 2025-01-02: 15 mL via ORAL
  Filled 2025-01-02: qty 30

## 2025-01-02 MED ORDER — HYDROXYZINE HCL 10 MG PO TABS
10.0000 mg | ORAL_TABLET | Freq: Every day | ORAL | 0 refills | Status: DC | PRN
  Filled 2025-01-02: qty 10, 10d supply, fill #0

## 2025-01-02 MED ORDER — PREDNISONE 10 MG PO TABS
ORAL_TABLET | ORAL | 0 refills | Status: AC
  Filled 2025-01-02: qty 21, 9d supply, fill #0

## 2025-01-02 MED ORDER — SULFAMETHOXAZOLE-TRIMETHOPRIM 800-160 MG PO TABS
1.0000 | ORAL_TABLET | Freq: Two times a day (BID) | ORAL | 0 refills | Status: AC
  Filled 2025-01-02: qty 6, 3d supply, fill #0

## 2025-01-02 NOTE — Discharge Summary (Signed)
 " Physician Discharge Summary   Patient: Abigail Lamb MRN: [id number] DOB: 1951/01/30  Admit date:     12/30/2024  Discharge date: 01/02/2025  Discharge Physician: Toribio Door   PCP: Joshua Debby CROME, MD   Recommendations at discharge:  Follow-up COPD exacerbation, urinary symptoms  Discharge Diagnoses: Principal Problem:   COPD exacerbation (HCC) Active Problems:   Cigarette smoker   Rheumatoid arthritis involving multiple sites (HCC)   IBS (irritable bowel syndrome)   Acute respiratory failure with hypoxia (HCC)  Resolved Problems:   * No resolved hospital problems. *  Hospital Course: 74 year old woman PMH including emphysema, COPD, smoker, presented with shortness of breath for the last several days, fatigue, chills.  Not responsive to inhaler.  Noted to have SpO2 89% on room air per EMS.  COVID, flu, RSV negative.  CT chest no PE.  Admitted for COPD exacerbation and hypoxia.  Hypoxia resolved.  Improved with standard therapy.  Discharged home in good condition.  Consultants None   Procedures/Events 1/2 admit for acute hypoxia, COPD exacerbation  Acute hypoxic respiratory failure COPD exacerbation Emphysema Smoker Leukocytosis noted but she has no signs or symptoms to suggest infection and her CTA chest showed no infiltrate.  Stop smoking.  Does not want nicotine patch. Slowly improving.  Has been successfully weaned off oxygen.  Continue bronchodilators, antibiotics, steroids.  UTI In the emergency department she was diagnosed with a UTI, however her clinical picture is confusing as she has had difficulty initiating urinary stream and dysuria for 6 months.  However her urinalysis was significantly abnormal compared to a few days prior and perhaps her symptoms had worsened somewhat, therefore she was treated empirically with 3 days of antibiotics. She reported discomfort in the external vaginal area and was concerned about chronic skin condition there.  Examination  documented as below.  Unrevealing.   Chronic ambulatory dysfunction Multiple falls at home, already receiving physical therapy at home Plan to resume home health on discharge   Rheumatoid arthritis     Irritable bowel syndrome Continue chronic regimen  Disposition: Home health Diet recommendation:  Regular diet DISCHARGE MEDICATION: Allergies as of 01/02/2025       Reactions   Pregabalin  Other (See Comments)   Made the patient not feel right   Dust Mite Extract Other (See Comments)   Multiple environmental allergies, also        Medication List     STOP taking these medications    ciclopirox  8 % solution Commonly known as: PENLAC        TAKE these medications    albuterol  108 (90 Base) MCG/ACT inhaler Commonly known as: VENTOLIN  HFA Inhale 2 puffs into the lungs every 4 (four) hours as needed for shortness of breath or wheezing.   Breztri  Aerosphere 160-9-4.8 MCG/ACT Aero inhaler Generic drug: budesonide -glycopyrrolate -formoterol  Inhale 2 puffs into the lungs in the morning and at bedtime.   celecoxib  50 MG capsule Commonly known as: CELEBREX  TAKE 1 CAPSULE BY MOUTH 2 TIMES DAILY.   clindamycin 1 % external solution Commonly known as: CLEOCIN T Apply 1 Application topically 2 (two) times daily as needed (for irritation- affected area).   clonazePAM  0.5 MG tablet Commonly known as: KLONOPIN  Take 1 tablet (0.5 mg total) by mouth 2 (two) times daily as needed for anxiety.   colesevelam  625 MG tablet Commonly known as: WELCHOL  TAKE 3 TABLETS BY MOUTH TWICE A DAY WITH MEAL What changed: See the new instructions.   dicyclomine  10 MG capsule Commonly known as: BENTYL  TAKE  1 CAPSULE (10 MG TOTAL) BY MOUTH 4 TIMES A DAY BEFORE MEALS AND AT BEDTIME   DULoxetine  30 MG capsule Commonly known as: CYMBALTA  TAKE 3 CAPSULES (90 MG TOTAL) BY MOUTH DAILY. FOR TOTAL OF 90 MG DAILY What changed:  how much to take when to take this additional instructions    famotidine  40 MG tablet Commonly known as: PEPCID  TAKE 1 TABLET BY MOUTH EVERYDAY AT BEDTIME What changed: See the new instructions.   fluocinonide -emollient 0.05 % cream Commonly known as: LIDEX -E Apply 1 application topically 2 (two) times daily. What changed:  when to take this reasons to take this   fluticasone  50 MCG/ACT nasal spray Commonly known as: FLONASE  Place 1 spray into both nostrils daily. What changed:  when to take this reasons to take this   folic acid  1 MG tablet Commonly known as: FOLVITE  Take 1 mg by mouth daily.   hydroxychloroquine  200 MG tablet Commonly known as: PLAQUENIL  Take 1 tablet (200 mg total) by mouth daily.   mupirocin  ointment 2 % Commonly known as: BACTROBAN  Apply 1 Application topically 2 (two) times daily. What changed:  when to take this reasons to take this   ondansetron  4 MG disintegrating tablet Commonly known as: ZOFRAN -ODT Take 1 tablet (4 mg total) by mouth every 8 (eight) hours as needed for nausea or vomiting. What changed: reasons to take this   pantoprazole  40 MG tablet Commonly known as: PROTONIX  TAKE 1 TABLET BY MOUTH EVERY DAY What changed: when to take this   predniSONE  10 MG tablet Commonly known as: DELTASONE  Take 4 tablets by mouth daily with breakfast for 3 days, THEN 2 tablets daily for 3 days, THEN 1 tablet daily for 3 days. Start taking on: January 02, 2025   RECLAST  IV Inject into the vein. Infusion: 12/03/2023   sulfamethoxazole -trimethoprim  800-160 MG tablet Commonly known as: BACTRIM  DS Take 1 tablet by mouth 2 (two) times daily for 3 days. Notes to patient: Take with food   sulfaSALAzine  500 MG tablet Commonly known as: AZULFIDINE  Take 2 tablets (1,000 mg total) by mouth 2 (two) times daily.   Vitamin D3 125 MCG (5000 UT) Caps Take 5,000 Units by mouth daily.        Contact information for follow-up providers     Shelah Lamar RAMAN, MD Follow up.   Specialty: Pulmonary Disease Why:  Office will contact you with an appointment Contact information: [address] [place] [address] [phone]              Contact information for after-discharge care     Home Medical Care     [facility]) .   Service: Home Health Services Contact information: [address] [place]  [phone]                    Reports chronic dysuria and difficulty initiating stream with some irritation external vagina.  Has chronic skin condition.  Requested examination.  Exam conducted with chaperone bedside RN Mylinda Lars Discharge Exam: Fredricka Weights   12/30/24 2348  Weight: 36.3 kg   Physical Exam Vitals reviewed.  Constitutional:      General: She is not in acute distress.    Appearance: She is not ill-appearing or toxic-appearing.  Cardiovascular:     Rate and Rhythm: Normal rate and regular rhythm.     Heart sounds: No murmur heard. Pulmonary:     Effort: Pulmonary effort is  normal. No respiratory distress.     Breath sounds: No wheezing, rhonchi or rales.  Genitourinary:    Comments: External genitalia appear unremarkable, perhaps there is some hypertrophic skin left labia majora.  No erythema. Neurological:     Mental Status: She is alert.  Psychiatric:        Mood and Affect: Mood normal.        Behavior: Behavior normal.      Condition at discharge: good  The results of significant diagnostics from this hospitalization (including imaging, microbiology, ancillary and laboratory) are listed below for reference.   Imaging Studies: CT Angio Chest PE W and/or Wo Contrast Result Date: 12/30/2024 CLINICAL DATA:  Shortness of breath, positive D-dimer EXAM: CT ANGIOGRAPHY CHEST WITH CONTRAST TECHNIQUE: Multidetector CT imaging of the chest was performed using the standard protocol during bolus administration of intravenous contrast. Multiplanar CT image reconstructions and MIPs were obtained  to evaluate the vascular anatomy. RADIATION DOSE REDUCTION: This exam was performed according to the departmental dose-optimization program which includes automated exposure control, adjustment of the mA and/or kV according to patient size and/or use of iterative reconstruction technique. CONTRAST:  75mL OMNIPAQUE  IOHEXOL  350 MG/ML SOLN COMPARISON:  December 02, 2023 FINDINGS: Cardiovascular: Satisfactory opacification of the pulmonary arteries to the segmental level. No evidence of pulmonary embolism. Normal heart size. No pericardial effusion. Mediastinum/Nodes: No enlarged mediastinal, hilar, or axillary lymph nodes. Thyroid  gland, trachea, and esophagus demonstrate no significant findings. Lungs/Pleura: No pneumothorax or pleural effusion is noted. Emphysematous disease is noted predominantly in the upper lobes. Hyperinflation of the lungs is noted. No pulmonary consolidation is noted. Upper Abdomen: No acute abnormality. Musculoskeletal: No chest wall abnormality. No acute or significant osseous findings. Review of the MIP images confirms the above findings. IMPRESSION: 1. No definite evidence of pulmonary embolus. 2. Emphysematous disease is noted predominantly in the upper lobes. Hyperinflation of the lungs is noted. Emphysema (ICD10-J43.9). Electronically Signed   By: Lynwood Landy Raddle M.D.   On: 12/30/2024 15:45   DG Chest 2 View Result Date: 12/30/2024 CLINICAL DATA:  Shortness of breath. EXAM: CHEST - 2 VIEW COMPARISON:  12/28/2024. FINDINGS: The heart size and mediastinal contours are unchanged. Emphysema. No focal consolidation, pleural effusion, or pneumothorax. No acute osseous abnormality. IMPRESSION: 1. No acute cardiopulmonary findings. 2. Emphysema. Electronically Signed   By: Harrietta Sherry M.D.   On: 12/30/2024 12:31   DG Chest 2 View Result Date: 12/28/2024 CLINICAL DATA:  Shortness of breath and cough. EXAM: CHEST - 2 VIEW COMPARISON:  Chest radiograph dated 03/22/2024. FINDINGS:  Background of emphysema. No focal consolidation, pleural effusion, pneumothorax. The cardiac silhouette is within normal limits. No acute osseous pathology. IMPRESSION: No active cardiopulmonary disease. Emphysema. Electronically Signed   By: Vanetta Chou M.D.   On: 12/28/2024 18:50    Microbiology: Results for orders placed or performed during the hospital encounter of 12/30/24  Resp panel by RT-PCR (RSV, Flu A&B, Covid) Anterior Nasal Swab     Status: None   Collection Time: 12/30/24 12:46 PM   Specimen: Anterior Nasal Swab  Result Value Ref Range Status   SARS Coronavirus 2 by RT PCR NEGATIVE NEGATIVE Final    Comment: (NOTE) SARS-CoV-2 target nucleic acids are NOT DETECTED.  The SARS-CoV-2 RNA is generally detectable in upper respiratory specimens during the acute phase of infection. The lowest concentration of SARS-CoV-2 viral copies this assay can detect is 138 copies/mL. A negative result does not preclude SARS-Cov-2 infection and should not be used  as the sole basis for treatment or other patient management decisions. A negative result may occur with  improper specimen collection/handling, submission of specimen other than nasopharyngeal swab, presence of viral mutation(s) within the areas targeted by this assay, and inadequate number of viral copies(<138 copies/mL). A negative result must be combined with clinical observations, patient history, and epidemiological information. The expected result is Negative.  Fact Sheet for Patients:  bloggercourse.com  Fact Sheet for Healthcare Providers:  seriousbroker.it  This test is no t yet approved or cleared by the [place]  FDA and  has been authorized for detection and/or diagnosis of SARS-CoV-2 by FDA under an Emergency Use Authorization (EUA). This EUA will remain  in effect (meaning this test can be used) for the duration of the COVID-19 declaration under Section  564(b)(1) of the Act, 21 U.S.C.section 360bbb-3(b)(1), unless the authorization is terminated  or revoked sooner.       Influenza A by PCR NEGATIVE NEGATIVE Final   Influenza B by PCR NEGATIVE NEGATIVE Final    Comment: (NOTE) The Xpert Xpress SARS-CoV-2/FLU/RSV plus assay is intended as an aid in the diagnosis of influenza from Nasopharyngeal swab specimens and should not be used as a sole basis for treatment. Nasal washings and aspirates are unacceptable for Xpert Xpress SARS-CoV-2/FLU/RSV testing.  Fact Sheet for Patients: bloggercourse.com  Fact Sheet for Healthcare Providers: seriousbroker.it  This test is not yet approved or cleared by the [place]  FDA and has been authorized for detection and/or diagnosis of SARS-CoV-2 by FDA under an Emergency Use Authorization (EUA). This EUA will remain in effect (meaning this test can be used) for the duration of the COVID-19 declaration under Section 564(b)(1) of the Act, 21 U.S.C. section 360bbb-3(b)(1), unless the authorization is terminated or revoked.     Resp Syncytial Virus by PCR NEGATIVE NEGATIVE Final    Comment: (NOTE) Fact Sheet for Patients: bloggercourse.com  Fact Sheet for Healthcare Providers: seriousbroker.it  This test is not yet approved or cleared by the [place]  FDA and has been authorized for detection and/or diagnosis of SARS-CoV-2 by FDA under an Emergency Use Authorization (EUA). This EUA will remain in effect (meaning this test can be used) for the duration of the COVID-19 declaration under Section 564(b)(1) of the Act, 21 U.S.C. section 360bbb-3(b)(1), unless the authorization is terminated or revoked.  Performed at [facility], [address], [place], [place] [address]     Labs: CBC: Recent Labs  Lab 12/28/24 1634 12/30/24 1144  WBC 9.5 19.2*  NEUTROABS 5.4  --    HGB 15.8* 14.9  HCT 46.7* 47.0*  MCV 96.5 100.0  PLT 54* 84*   Basic Metabolic Panel: Recent Labs  Lab 12/28/24 1633 12/30/24 1144  NA 137 141  K 4.7 4.4  CL 99 103  CO2 28 29  GLUCOSE 105* 122*  BUN 22 20  CREATININE 0.61 0.61  CALCIUM 9.9 9.1   Liver Function Tests: Recent Labs  Lab 12/28/24 1633  AST 40  ALT 54*  ALKPHOS 121  BILITOT 0.4  PROT 8.1  ALBUMIN 3.9   CBG: Recent Labs  Lab 12/28/24 1619  GLUCAP 122*    Discharge time spent: greater than 30 minutes.  Signed: Toribio Door, MD [facility] 01/02/2025 "

## 2025-01-02 NOTE — Telephone Encounter (Unsigned)
 Copied from CRM [id number]. Topic: Clinical - Medication Refill >> Jan 02, 2025 12:21 PM Darshell M wrote: Medication:  hydrOXYzine  (ATARAX ) tablet 10 mg, 1 tablet 3x per day  Patient was taking Clonizipam but this was discontinued in the hospital since it was causing patient to be unsteady. Patient discharged 01/02/2025. Patient now prescribed hydroxizine, 1 tablet, 3 times per day. Patient requesting new prescription for hydroxizine. Appointment scheduled 01/19/2025 with PCP. Patient's daughter, Lona  [phone]   [id number] [place], [place] - [address] [place] [address] Phone: [phone] Fax: [phone]   Has the patient contacted their pharmacy? Yes (Agent: If no, request that the patient contact the pharmacy for the refill. If patient does not wish to contact the pharmacy document the reason why and proceed with request.) (Agent: If yes, when and what did the pharmacy advise?)  This is the patient's preferred pharmacy: No Pharmacies Listed Is this the correct pharmacy for this prescription? Yes If no, delete pharmacy and type the correct one.   Has the prescription been filled recently? No  Is the patient out of the medication? Yes  Has the patient been seen for an appointment in the last year OR does the patient have an upcoming appointment? Yes  Can we respond through MyChart? Yes  Agent: Please be advised that Rx refills may take up to 3 business days. We ask that you follow-up with your pharmacy.

## 2025-01-02 NOTE — Progress Notes (Signed)
 Discharge meds in a secure bag delivered to patient by this RN

## 2025-01-02 NOTE — TOC Transition Note (Signed)
 Transition of Care [facility]) - Discharge Note   Patient Details  Name: Abigail Lamb MRN: [id number] Date of Birth: 12/29/1951  [facility] CM/SW Contact:  Tawni CHRISTELLA Eva, LCSW Phone Number: 01/02/2025, 11:24 AM   Clinical Narrative:     Pt to d/c home with home health services. Pt was accepted by [facility], contact information has been added to pt's aVS. ICM sign off.   Final next level of care: Home w Home Health Services Barriers to Discharge: Barriers Resolved   Patient Goals and CMS Choice Patient states their goals for this hospitalization and ongoing recovery are:: home CMS Medicare.gov Compare Post Acute Care list provided to:: Patient Choice offered to / list presented to : Patient [place] ownership interest in [facility].provided to:: Patient    Discharge Placement                    Patient and family notified of of transfer: 01/02/25  Discharge Plan and Services Additional resources added to the After Visit Summary for     Discharge Planning Services: CM Consult            DME Arranged: N/A DME Agency: NA       HH Arranged: NA HH Agency: NA        Social Drivers of Health (SDOH) Interventions SDOH Screenings   Food Insecurity: Food Insecurity Present (12/31/2024)  Housing: Low Risk (12/31/2024)  Transportation Needs: No Transportation Needs (12/31/2024)  Recent Concern: Transportation Needs - Unmet Transportation Needs (12/31/2024)  Utilities: At Risk (12/31/2024)  Alcohol Screen: Low Risk (03/19/2023)  Depression (PHQ2-9): High Risk (09/16/2024)  Financial Resource Strain: High Risk (07/20/2024)  Physical Activity: Inactive (07/20/2024)  Social Connections: Moderately Isolated (12/31/2024)  Stress: Stress Concern Present (07/20/2024)  Tobacco Use: High Risk (12/30/2024)  Health Literacy: Adequate Health Literacy (01/21/2024)     Readmission Risk Interventions     No data to display

## 2025-01-02 NOTE — Progress Notes (Signed)
 AVS reprinted & reviewed w/ patient with added antibiotic. Patient verbalized an understanding- daughter enroute. Patient dressed for discharge to home

## 2025-01-04 ENCOUNTER — Ambulatory Visit: Admitting: Emergency Medicine

## 2025-01-04 ENCOUNTER — Encounter: Payer: Self-pay | Admitting: Emergency Medicine

## 2025-01-04 VITALS — BP 101/60 | HR 100 | Ht 61.5 in | Wt 71.0 lb

## 2025-01-04 DIAGNOSIS — R911 Solitary pulmonary nodule: Secondary | ICD-10-CM | POA: Diagnosis not present

## 2025-01-04 DIAGNOSIS — J449 Chronic obstructive pulmonary disease, unspecified: Secondary | ICD-10-CM | POA: Diagnosis not present

## 2025-01-04 DIAGNOSIS — F17210 Nicotine dependence, cigarettes, uncomplicated: Secondary | ICD-10-CM | POA: Diagnosis not present

## 2025-01-04 MED ORDER — HYDROXYZINE HCL 50 MG PO TABS: 50.0000 mg | ORAL_TABLET | Freq: Three times a day (TID) | ORAL | 0 refills | Status: DC | PRN

## 2025-01-04 NOTE — Assessment & Plan Note (Signed)
 Recent hospitalization for exacerbation, improved.  Remains quite limited.  There is a connection between her anxiety state and her dyspnea.  She tried Atarax  when she was admitted the hospital and felt that it benefited her.  She asked me today if we could refill it.  I will give her a 1 month supply and have her follow-up with her primary physician to discuss whether it has been beneficial and whether it would be safe to continue.  Please continue Breztri  2 puffs twice a day.  Rinse and gargle after using. Keep your albuterol  available to use 2 puffs if needed for shortness of breath, chest tightness, wheezing. Get your flu shot as planned.  Would recommend that you also get the RSV vaccine.  We discussed the COVID-19 vaccine and you have decided to defer Try using Atarax  3 times a day to see how you benefit.  Discussed this with your primary care doctor when you have that appointment.  If you do benefit then you can discuss together whether this is a medication you should continue going forward. Follow in our office in 6 months.  Please call sooner if you have any problems.

## 2025-01-04 NOTE — Telephone Encounter (Signed)
 Please advise

## 2025-01-04 NOTE — Progress Notes (Signed)
 "  Subjective:    Patient ID: Abigail Lamb, female    DOB: 03-May-1951, 74 y.o.   MRN: [id number]  HPI  ROV 01/04/2025 --74 year old woman, active smoker (75 pack years) with a history of COPD/emphysema, MGUS, RA on immunosuppression (hydroxychloroquine ).  I have seen her for COPD, tobacco use and pulmonary nodular disease.  Her CT scan of the chest had been stable and we transition her over to the lung cancer screening program. She was in the emergency department on 1/2 with flaring COPD symptoms and hypoxemia.  Treated with Bactrim  and prednisone . COVID and flu were negative.  Maintenance regimen is Breztri , albuterol  as needed which she uses 1-2x a day.   She is on Flonase  as needed. She is feeling better - continues to smoke.   CT-PA 12/30/2024 reviewed by me shows no pulmonary embolism, no enlarged mediastinal or hilar adenopathy, emphysematous disease bilaterally with hyperinflation.  No infiltrates.  No significant change in small scattered pulmonary nodules  Review of Systems As per HPI  Past Medical History:  Diagnosis Date   Arthritis    Celiac disease    COPD (chronic obstructive pulmonary disease) (HCC)    Encephalopathy    As teenager   Gastroparesis    GERD (gastroesophageal reflux disease)    Hyperlipidemia    Hypertension    IBS (irritable bowel syndrome)    MGUS (monoclonal gammopathy of unknown significance)    per patient    MGUS (monoclonal gammopathy of unknown significance)    Neurodermatitis    Osteoporosis    per patient, diagnosed by Dr. [facility] in [facility]   PUD (peptic ulcer disease)    Pulmonary emphysema (HCC)    Recurrent depression    Rheumatoid arteritis (HCC)    Smoker      Family History  Problem Relation Age of Onset   CVA Mother    CAD Mother    Diabetes Mother    COPD Mother    Fibromyalgia Mother    Depression Mother    Hypertension Mother    Emphysema Mother        smoked   Heart attack Mother    Prostate cancer Father    Cancer -  Prostate Father    Rheum arthritis Sister    Skin cancer Brother    Kidney Stones Brother    Hernia Brother    Depression Daughter    Healthy Son    Healthy Son    Colon polyps Neg Hx    Colon cancer Neg Hx    Esophageal cancer Neg Hx    Stomach cancer Neg Hx    Rectal cancer Neg Hx      Social History   Socioeconomic History   Marital status: Divorced    Spouse name: Not on file   Number of children: 3   Years of education: Not on file   Highest education level: Associate degree: occupational, scientist, product/process development, or vocational program  Occupational History   Occupation: RETIRED  Tobacco Use   Smoking status: Every Day    Current packs/day: 0.75    Average packs/day: 0.8 packs/day for 49.0 years (36.8 ttl pk-yrs)    Types: Cigarettes    Passive exposure: Current   Smokeless tobacco: Never   Tobacco comments:    States she quit smoking 01/03/2025. Marcus HERO CMA verified 01/04/2025.  Vaping Use   Vaping status: Former  Substance and Sexual Activity   Alcohol use: Not Currently   Drug use: Not Currently   Sexual  activity: Not Currently    Partners: Male  Other Topics Concern   Not on file  Social History Narrative   Son and his girlfriend Lives with her-2025 and a dog   Social Drivers of Health   Tobacco Use: High Risk (01/04/2025)   Patient History    Smoking Tobacco Use: Every Day    Smokeless Tobacco Use: Never    Passive Exposure: Current  Financial Resource Strain: High Risk (07/20/2024)   Overall Financial Resource Strain (CARDIA)    Difficulty of Paying Living Expenses: Hard  Food Insecurity: Food Insecurity Present (12/31/2024)   Epic    Worried About Programme Researcher, Broadcasting/film/video in the Last Year: Sometimes true    Ran Out of Food in the Last Year: Never true  Transportation Needs: No Transportation Needs (12/31/2024)   Epic    Lack of Transportation (Medical): No    Lack of Transportation (Non-Medical): No  Recent Concern: Transportation Needs - Unmet Transportation Needs  (12/31/2024)   Epic    Lack of Transportation (Medical): Yes    Lack of Transportation (Non-Medical): Yes  Physical Activity: Inactive (07/20/2024)   Exercise Vital Sign    Days of Exercise per Week: 0 days    Minutes of Exercise per Session: Not on file  Stress: Stress Concern Present (07/20/2024)   [employer] of Occupational Health - Occupational Stress Questionnaire    Feeling of Stress: To some extent  Social Connections: Moderately Isolated (12/31/2024)   Social Connection and Isolation Panel    Frequency of Communication with Friends and Family: More than three times a week    Frequency of Social Gatherings with Friends and Family: More than three times a week    Attends Religious Services: 1 to 4 times per year    Active Member of [employer] or Organizations: No    Attends Banker Meetings: Never    Marital Status: Widowed  Intimate Partner Violence: Not At Risk (12/31/2024)   Epic    Fear of Current or Ex-Partner: No    Emotionally Abused: No    Physically Abused: No    Sexually Abused: No  Depression (PHQ2-9): High Risk (09/16/2024)   Depression (PHQ2-9)    PHQ-2 Score: 15  Alcohol Screen: Low Risk (03/19/2023)   Alcohol Screen    Last Alcohol Screening Score (AUDIT): 0  Housing: Low Risk (12/31/2024)   Epic    Unable to Pay for Housing in the Last Year: No    Number of Times Moved in the Last Year: 0    Homeless in the Last Year: No  Utilities: At Risk (12/31/2024)   Epic    Threatened with loss of utilities: Yes  Health Literacy: Adequate Health Literacy (01/21/2024)   B1300 Health Literacy    Frequency of need for help with medical instructions: Never     Allergies  Allergen Reactions   Pregabalin  Other (See Comments)    Made the patient not feel right   Dust Mite Extract Other (See Comments)    Multiple environmental allergies, also     Outpatient Medications Prior to Visit  Medication Sig Dispense Refill   albuterol  (VENTOLIN  HFA) 108 (90 Base)  MCG/ACT inhaler Inhale 2 puffs into the lungs every 4 (four) hours as needed for shortness of breath or wheezing.     budesonide -glycopyrrolate -formoterol  (BREZTRI  AEROSPHERE) 160-9-4.8 MCG/ACT AERO inhaler Inhale 2 puffs into the lungs in the morning and at bedtime. 3 each 6   celecoxib  (CELEBREX ) 50 MG capsule TAKE 1  CAPSULE BY MOUTH 2 TIMES DAILY. 60 capsule 1   Cholecalciferol (VITAMIN D3) 125 MCG (5000 UT) CAPS Take 5,000 Units by mouth daily.     clindamycin (CLEOCIN T) 1 % external solution Apply 1 Application topically 2 (two) times daily as needed (for irritation- affected area).     clonazePAM  (KLONOPIN ) 0.5 MG tablet Take 1 tablet (0.5 mg total) by mouth 2 (two) times daily as needed for anxiety. 60 tablet 0   colesevelam  (WELCHOL ) 625 MG tablet TAKE 3 TABLETS BY MOUTH TWICE A DAY WITH MEAL (Patient taking differently: Take 1,875 mg by mouth in the morning and at bedtime.) 180 tablet 3   dicyclomine  (BENTYL ) 10 MG capsule TAKE 1 CAPSULE (10 MG TOTAL) BY MOUTH 4 TIMES A DAY BEFORE MEALS AND AT BEDTIME 360 capsule 0   DULoxetine  (CYMBALTA ) 30 MG capsule TAKE 3 CAPSULES (90 MG TOTAL) BY MOUTH DAILY. FOR TOTAL OF 90 MG DAILY (Patient taking differently: Take 30-60 mg by mouth See admin instructions. Take 60 mg by mouth in the morning and 30 mg at bedtime) 270 capsule 1   famotidine  (PEPCID ) 40 MG tablet TAKE 1 TABLET BY MOUTH EVERYDAY AT BEDTIME 30 tablet 0   fluocinonide -emollient (LIDEX -E) 0.05 % cream Apply 1 application topically 2 (two) times daily. (Patient taking differently: Apply 1 application  topically 2 (two) times daily as needed (for inflammation/redness - affected area).) 60 g 3   fluticasone  (FLONASE ) 50 MCG/ACT nasal spray Place 1 spray into both nostrils daily. (Patient taking differently: Place 1 spray into both nostrils daily as needed for allergies or rhinitis.) 16 g 11   folic acid  (FOLVITE ) 1 MG tablet Take 1 mg by mouth daily.     hydroxychloroquine  (PLAQUENIL ) 200 MG  tablet Take 1 tablet (200 mg total) by mouth daily. 90 tablet 0   mupirocin  ointment (BACTROBAN ) 2 % Apply 1 Application topically 2 (two) times daily. (Patient taking differently: Apply 1 Application topically 2 (two) times daily as needed (for skin infections).) 30 g 2   ondansetron  (ZOFRAN -ODT) 4 MG disintegrating tablet Take 1 tablet (4 mg total) by mouth every 8 (eight) hours as needed for nausea or vomiting. (Patient taking differently: Take 4 mg by mouth every 8 (eight) hours as needed for nausea or vomiting (dissolve orally).) 20 tablet 5   pantoprazole  (PROTONIX ) 40 MG tablet TAKE 1 TABLET BY MOUTH EVERY DAY (Patient taking differently: Take 40 mg by mouth daily before breakfast.) 90 tablet 1   predniSONE  (DELTASONE ) 10 MG tablet Take 4 tablets by mouth daily with breakfast for 3 days, THEN 2 tablets daily for 3 days, THEN 1 tablet daily for 3 days. 21 tablet 0   sulfaSALAzine  (AZULFIDINE ) 500 MG tablet Take 2 tablets (1,000 mg total) by mouth 2 (two) times daily. 120 tablet 2   Zoledronic  Acid (RECLAST  IV) Inject into the vein. Infusion: 12/03/2023     sulfamethoxazole -trimethoprim  (BACTRIM  DS) 800-160 MG tablet Take 1 tablet by mouth 2 (two) times daily for 3 days. (Patient not taking: Reported on 01/04/2025) 6 tablet 0   No facility-administered medications prior to visit.         Objective:   Physical Exam Vitals:   01/04/25 1418  BP: 101/60  Pulse: 100  SpO2: 95%  Weight: 71 lb (32.2 kg)  Height: 5' 1.5 (1.562 m)   Gen: Pleasant, very thin, in no distress,  normal affect  ENT: No lesions,  mouth clear,  oropharynx clear, no postnasal drip  Neck: No JVD,  no stridor  Lungs: No use of accessory muscles, distant, no crackles or wheezing on normal respiration, no wheeze on forced expiration  Cardiovascular: RRR, heart sounds normal, no murmur or gallops, no peripheral edema  Musculoskeletal: some hand joint deformities.   Neuro: alert, awake, non focal  Skin: Warm, no  lesions or rash        Assessment & Plan:   GOLD C COPD by GOLD classification (HCC) Recent hospitalization for exacerbation, improved.  Remains quite limited.  There is a connection between her anxiety state and her dyspnea.  She tried Atarax  when she was admitted the hospital and felt that it benefited her.  She asked me today if we could refill it.  I will give her a 1 month supply and have her follow-up with her primary physician to discuss whether it has been beneficial and whether it would be safe to continue.  Please continue Breztri  2 puffs twice a day.  Rinse and gargle after using. Keep your albuterol  available to use 2 puffs if needed for shortness of breath, chest tightness, wheezing. Get your flu shot as planned.  Would recommend that you also get the RSV vaccine.  We discussed the COVID-19 vaccine and you have decided to defer Try using Atarax  3 times a day to see how you benefit.  Discussed this with your primary care doctor when you have that appointment.  If you do benefit then you can discuss together whether this is a medication you should continue going forward. Follow in our office in 6 months.  Please call sooner if you have any problems.  Lung nodule Plan for continued follow-up in lung cancer screening program next low-dose CT chest should be in January 2027.  I personally spent a total of 43 minutes in the care of the patient today including preparing to see the patient, getting/reviewing separately obtained history, performing a medically appropriate exam/evaluation, counseling and educating, placing orders, documenting clinical information in the EHR, independently interpreting results, and communicating results.   Lamar Chris, MD, PhD 01/04/2025, 4:47 PM [facility] [phone] or if no answer before 7:00PM call [phone] For any issues after 7:00PM please call eLink [phone]  "

## 2025-01-04 NOTE — Patient Instructions (Addendum)
 Please continue Breztri  2 puffs twice a day.  Rinse and gargle after using. Keep your albuterol  available to use 2 puffs if needed for shortness of breath, chest tightness, wheezing. Get your flu shot as planned.  Would recommend that you also get the RSV vaccine.  We discussed the COVID-19 vaccine and you have decided to defer Try using Atarax  3 times a day to see how you benefit.  Discussed this with your primary care doctor when you have that appointment.  If you do benefit then you can discuss together whether this is a medication you should continue going forward. You need a lung cancer screening CT scan of the chest in January 2027. Follow in our office in 6 months.  Please call sooner if you have any problems.

## 2025-01-04 NOTE — Assessment & Plan Note (Signed)
 Plan for continued follow-up in lung cancer screening program next low-dose CT chest should be in January 2027.

## 2025-01-09 ENCOUNTER — Telehealth: Payer: Self-pay | Admitting: *Deleted

## 2025-01-09 NOTE — Telephone Encounter (Signed)
 [facility] plan signed and faxed to [phone]. Confirmation received.  And order signed and faxed to extended PT.

## 2025-01-10 ENCOUNTER — Telehealth: Payer: Self-pay | Admitting: *Deleted

## 2025-01-10 ENCOUNTER — Telehealth: Payer: Self-pay

## 2025-01-10 NOTE — Telephone Encounter (Unsigned)
 Copied from CRM [id number]. Topic: Clinical - Medical Advice >> Jan 10, 2025  2:49 PM Isabell A wrote: Reason for CRM: Charisse, Physical Therapist states theres a drug interaction/concern with  hydroxyzine , hydroxychloroquine , sulfasalazine  that has been prescribed by pulmonary and rheumatology.    Callback number: [phone]

## 2025-01-10 NOTE — Telephone Encounter (Signed)
 Charisse, PT calling relating to pt on medications not prescribed by us .  Drug interactions/ contraindications concerning medications from Pulmonary and Rheumatology.  I relayed  hydroxyzine , sulfasalazine , and hydrozychloroquine. I relayed to call there offices if needed. Gave her prescribers names #.   She appreciated call.

## 2025-01-11 NOTE — Telephone Encounter (Signed)
Okay thank you noted

## 2025-01-12 ENCOUNTER — Emergency Department (HOSPITAL_COMMUNITY)

## 2025-01-12 ENCOUNTER — Encounter (HOSPITAL_COMMUNITY): Payer: Self-pay

## 2025-01-12 ENCOUNTER — Emergency Department (HOSPITAL_COMMUNITY)
Admission: EM | Admit: 2025-01-12 | Discharge: 2025-01-12 | Disposition: A | Discharge status: Home / Self Care | Attending: Emergency Medicine | Admitting: Emergency Medicine

## 2025-01-12 ENCOUNTER — Other Ambulatory Visit: Payer: Self-pay

## 2025-01-12 DIAGNOSIS — N200 Calculus of kidney: Secondary | ICD-10-CM | POA: Insufficient documentation

## 2025-01-12 DIAGNOSIS — Y92009 Unspecified place in unspecified non-institutional (private) residence as the place of occurrence of the external cause: Secondary | ICD-10-CM | POA: Diagnosis not present

## 2025-01-12 DIAGNOSIS — Z79899 Other long term (current) drug therapy: Secondary | ICD-10-CM | POA: Insufficient documentation

## 2025-01-12 DIAGNOSIS — J449 Chronic obstructive pulmonary disease, unspecified: Secondary | ICD-10-CM | POA: Diagnosis not present

## 2025-01-12 DIAGNOSIS — I700 Atherosclerosis of aorta: Secondary | ICD-10-CM | POA: Diagnosis not present

## 2025-01-12 DIAGNOSIS — S20222A Contusion of left back wall of thorax, initial encounter: Secondary | ICD-10-CM | POA: Diagnosis not present

## 2025-01-12 DIAGNOSIS — I10 Essential (primary) hypertension: Secondary | ICD-10-CM | POA: Diagnosis not present

## 2025-01-12 DIAGNOSIS — R42 Dizziness and giddiness: Secondary | ICD-10-CM | POA: Insufficient documentation

## 2025-01-12 DIAGNOSIS — W1830XA Fall on same level, unspecified, initial encounter: Secondary | ICD-10-CM | POA: Insufficient documentation

## 2025-01-12 DIAGNOSIS — R739 Hyperglycemia, unspecified: Secondary | ICD-10-CM | POA: Diagnosis not present

## 2025-01-12 DIAGNOSIS — W19XXXA Unspecified fall, initial encounter: Secondary | ICD-10-CM

## 2025-01-12 DIAGNOSIS — Z7951 Long term (current) use of inhaled steroids: Secondary | ICD-10-CM | POA: Diagnosis not present

## 2025-01-12 DIAGNOSIS — S29002A Unspecified injury of muscle and tendon of back wall of thorax, initial encounter: Secondary | ICD-10-CM | POA: Diagnosis present

## 2025-01-12 LAB — CBC WITH DIFFERENTIAL/PLATELET
Abs Immature Granulocytes: 0.09 K/uL — ABNORMAL HIGH (ref 0.00–0.07)
Basophils Absolute: 0.1 K/uL (ref 0.0–0.1)
Basophils Relative: 1 %
Eosinophils Absolute: 0.2 K/uL (ref 0.0–0.5)
Eosinophils Relative: 1 %
HCT: 41.7 % (ref 36.0–46.0)
Hemoglobin: 13.6 g/dL (ref 12.0–15.0)
Immature Granulocytes: 1 %
Lymphocytes Relative: 36 %
Lymphs Abs: 5.5 K/uL — ABNORMAL HIGH (ref 0.7–4.0)
MCH: 32.1 pg (ref 26.0–34.0)
MCHC: 32.6 g/dL (ref 30.0–36.0)
MCV: 98.3 fL (ref 80.0–100.0)
Monocytes Absolute: 1.8 K/uL — ABNORMAL HIGH (ref 0.1–1.0)
Monocytes Relative: 11 %
Neutro Abs: 7.8 K/uL — ABNORMAL HIGH (ref 1.7–7.7)
Neutrophils Relative %: 50 %
Platelets: 198 K/uL (ref 150–400)
RBC: 4.24 MIL/uL (ref 3.87–5.11)
RDW: 12.9 % (ref 11.5–15.5)
WBC: 15.4 K/uL — ABNORMAL HIGH (ref 4.0–10.5)
nRBC: 0 % (ref 0.0–0.2)

## 2025-01-12 LAB — BASIC METABOLIC PANEL WITH GFR
Anion gap: 10 (ref 5–15)
BUN: 33 mg/dL — ABNORMAL HIGH (ref 8–23)
CO2: 26 mmol/L (ref 22–32)
Calcium: 9.1 mg/dL (ref 8.9–10.3)
Chloride: 103 mmol/L (ref 98–111)
Creatinine, Ser: 0.95 mg/dL (ref 0.44–1.00)
GFR, Estimated: >60 mL/min
Glucose, Bld: 141 mg/dL — ABNORMAL HIGH (ref 70–99)
Potassium: 4.5 mmol/L (ref 3.5–5.1)
Sodium: 139 mmol/L (ref 135–145)

## 2025-01-12 LAB — I-STAT CREATININE, ED: Creatinine, Ser: 0.9 mg/dL (ref 0.44–1.00)

## 2025-01-12 LAB — TROPONIN T, HIGH SENSITIVITY
Troponin T High Sensitivity: 15 ng/L (ref 0–19)
Troponin T High Sensitivity: <15 ng/L (ref 0–19)

## 2025-01-12 MED ORDER — IOHEXOL 350 MG/ML SOLN
45.0000 mL | Freq: Once | INTRAVENOUS | Status: AC | PRN
  Administered 2025-01-12: 45 mL via INTRAVENOUS

## 2025-01-12 MED ORDER — SODIUM CHLORIDE 0.9 % IV BOLUS
1000.0000 mL | Freq: Once | INTRAVENOUS | Status: AC
  Administered 2025-01-12: 1000 mL via INTRAVENOUS

## 2025-01-12 NOTE — ED Notes (Signed)
 Patient transported to CT

## 2025-01-12 NOTE — Discharge Instructions (Signed)
 Apply ice for 30 minutes at a time, 4 times a day.  In addition to the medications that you take for your arthritis, you may take acetaminophen  as needed to get additional pain relief.

## 2025-01-12 NOTE — ED Triage Notes (Addendum)
 Pt from home, got dizzy and fell onto living room table. C/o pain on left shoulder and spine,and numbness and tingling sensation. Pt refused to wear c-collar or allow EMS to assess pt. Pt denies blood thinners. Pt states that she has been having dizzy episodes and has been trying to get treated for it.   170/90 90HR 97% RA

## 2025-01-12 NOTE — ED Notes (Signed)
[place] j applied  °

## 2025-01-12 NOTE — ED Provider Notes (Signed)
 " [facility] Provider Note   CSN: [id number] Arrival date & time: 01/12/25  0201     Patient presents with: No chief complaint on file.   Abigail Lamb is a 74 y.o. female.   The history is provided by the patient.   She has a history of hypertension, hyperlipidemia, rheumatoid arthritis, COPD, MGUS and comes in following a fall at home.  She states that she got up from a sitting position and got dizzy and lightheaded and fell with her left scapular area hitting a table.  She is complaining of pain in that area.  She has been having problems with similar dizzy spells for over a year.  She denies chest pain, heaviness, tightness, pressure.  She is also complaining of some pain into her neck.  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  She is denying any weakness in her arms or legs, is vague about whether there is any numbness.    Prior to Admission medications  Medication Sig Start Date End Date Taking? Authorizing Provider  albuterol  (VENTOLIN  HFA) 108 (90 Base) MCG/ACT inhaler Inhale 2 puffs into the lungs every 4 (four) hours as needed for shortness of breath or wheezing. 09/07/18   [provider]  budesonide -glycopyrrolate -formoterol  (BREZTRI  AEROSPHERE) 160-9-4.8 MCG/ACT AERO inhaler Inhale 2 puffs into the lungs in the morning and at bedtime. 10/14/24   Shelah Lamar RAMAN, MD  celecoxib  (CELEBREX ) 50 MG capsule TAKE 1 CAPSULE BY MOUTH 2 TIMES DAILY. 11/28/24   Joshua Debby CROME, MD  Cholecalciferol (VITAMIN D3) 125 MCG (5000 UT) CAPS Take 5,000 Units by mouth daily.    [provider]  clindamycin (CLEOCIN T) 1 % external solution Apply 1 Application topically 2 (two) times daily as needed (for irritation- affected area). 01/20/20   [provider]  clonazePAM  (KLONOPIN ) 0.5 MG tablet Take 1 tablet (0.5 mg total) by mouth 2 (two) times daily as needed for  anxiety. 12/17/24   Joshua Debby CROME, MD  colesevelam  (WELCHOL ) 625 MG tablet TAKE 3 TABLETS BY MOUTH TWICE A DAY WITH MEAL Patient taking differently: Take 1,875 mg by mouth in the morning and at bedtime. 09/22/24   Joshua Debby CROME, MD  dicyclomine  (BENTYL ) 10 MG capsule TAKE 1 CAPSULE (10 MG TOTAL) BY MOUTH 4 TIMES A DAY BEFORE MEALS AND AT BEDTIME 09/16/24   Joshua Debby CROME, MD  DULoxetine  (CYMBALTA ) 30 MG capsule TAKE 3 CAPSULES (90 MG TOTAL) BY MOUTH DAILY. FOR TOTAL OF 90 MG DAILY Patient taking differently: Take 30-60 mg by mouth See admin instructions. Take 60 mg by mouth in the morning and 30 mg at bedtime 08/31/24   Joshua Debby CROME, MD  famotidine  (PEPCID ) 40 MG tablet TAKE 1 TABLET BY MOUTH EVERYDAY AT BEDTIME 12/15/24   Collier, Amanda R, PA-C  fluocinonide -emollient (LIDEX -E) 0.05 % cream Apply 1 application topically 2 (two) times daily. Patient taking differently: Apply 1 application  topically 2 (two) times daily as needed (for inflammation/redness - affected area). 11/05/20   Joshua Debby CROME, MD  fluticasone  (FLONASE ) 50 MCG/ACT nasal spray Place 1 spray into both nostrils daily. Patient taking differently: Place 1 spray into both nostrils daily as needed for allergies or rhinitis. 08/19/22   Gladis Leonor HERO, MD  folic acid  (FOLVITE ) 1 MG tablet Take 1 mg by mouth daily. 02/28/19   [provider]  hydroxychloroquine  (PLAQUENIL ) 200 MG tablet  Take 1 tablet (200 mg total) by mouth daily. 11/17/24   Cheryl Waddell HERO, PA-C  hydrOXYzine  (ATARAX ) 50 MG tablet Take 1 tablet (50 mg total) by mouth 3 (three) times daily as needed. 01/04/25   Shelah Lamar RAMAN, MD  mupirocin  ointment (BACTROBAN ) 2 % Apply 1 Application topically 2 (two) times daily. Patient taking differently: Apply 1 Application topically 2 (two) times daily as needed (for skin infections). 02/29/24   Gershon Donnice SAUNDERS, DPM  ondansetron  (ZOFRAN -ODT) 4 MG disintegrating tablet Take 1 tablet (4 mg total) by mouth every 8 (eight)  hours as needed for nausea or vomiting. Patient taking differently: Take 4 mg by mouth every 8 (eight) hours as needed for nausea or vomiting (dissolve orally). 04/20/24   Joshua Debby CROME, MD  pantoprazole  (PROTONIX ) 40 MG tablet TAKE 1 TABLET BY MOUTH EVERY DAY Patient taking differently: Take 40 mg by mouth daily before breakfast. 08/04/24   Joshua Debby CROME, MD  sulfaSALAzine  (AZULFIDINE ) 500 MG tablet Take 2 tablets (1,000 mg total) by mouth 2 (two) times daily. 11/17/24   Cheryl Waddell HERO, PA-C  Zoledronic  Acid (RECLAST  IV) Inject into the vein. Infusion: 12/03/2023    [provider]    Allergies: Pregabalin  and Dust mite extract    Review of Systems  All other systems reviewed and are negative.   Updated Vital Signs BP 94/62   Pulse 83   Temp 97.8 F (36.6 C) (Oral)   Resp 16   Ht 5' 1.5 (1.562 m)   Wt 31.8 kg   SpO2 100%   BMI 13.01 kg/m   Physical Exam Vitals and nursing note reviewed.   74 year old female, resting comfortably and in no acute distress. Vital signs are significant for slightly elevated respiratory rate and blood pressure. Oxygen saturation is 98%, which is normal. Head is normocephalic and atraumatic. PERRLA, EOMI.  Neck is immobilized in a stiff cervical collar and has mild tenderness diffusely, no point tenderness. Back is tender in the left scapular area, no definite midline tenderness. Lungs are clear without rales, wheezes, or rhonchi. Chest is tender in the left scapular area as noted above but no other areas of tenderness, no crepitus. Heart has regular rate and rhythm without murmur. Abdomen is soft, flat, nontender. Pelvis is stable and nontender. Extremities have no evidence of acute trauma, and deformities consistent with longstanding rheumatoid arthritis are present.  No pain on range of motion of any joints. Skin is warm and dry without rash. Neurologic: Awake and alert and oriented, cranial nerves are intact, strength is 5/5 in all 4  extremities, sensation is intact throughout but difficult to do detailed sensory testing.  (all labs ordered are listed, but only abnormal results are displayed) Labs Reviewed  BASIC METABOLIC PANEL WITH GFR - Abnormal; Notable for the following components:      Result Value   Glucose, Bld 141 (*)    BUN 33 (*)    All other components within normal limits  CBC WITH DIFFERENTIAL/PLATELET - Abnormal; Notable for the following components:   WBC 15.4 (*)    Neutro Abs 7.8 (*)    Lymphs Abs 5.5 (*)    Monocytes Absolute 1.8 (*)    Abs Immature Granulocytes 0.09 (*)    All other components within normal limits  I-STAT CREATININE, ED  TROPONIN T, HIGH SENSITIVITY  TROPONIN T, HIGH SENSITIVITY    EKG: EKG Interpretation Date/Time:  Thursday January 12 2025 02:25:52 EST Ventricular Rate:  88 PR Interval:  134 QRS Duration:  83 QT Interval:  390 QTC Calculation: 472 R Axis:   82  Text Interpretation: Sinus rhythm Biatrial enlargement Borderline right axis deviation Borderline ST elevation, anterior leads When compared with ECG of 12/30/2024, No significant change was found Confirmed by Raford Lenis ([id number]) on 01/12/2025 4:44:55 AM  Radiology: CT L-SPINE NO CHARGE Result Date: 01/12/2025 EXAM: CT OF THE LUMBAR SPINE WITHOUT CONTRAST 01/12/2025 03:31:27 AM TECHNIQUE: CT of the lumbar spine was performed without the administration of intravenous contrast. Multiplanar reformatted images are provided for review. Automated exposure control, iterative reconstruction, and/or weight based adjustment of the mA/kV was utilized to reduce the radiation dose to as low as reasonably achievable. COMPARISON: None available. CLINICAL HISTORY: FINDINGS: BONES AND ALIGNMENT: Normal vertebral body heights. Slight degenerative anterolisthesis at L4-L5. Mild irregularity of the superior endplate of L4. No acute fracture or suspicious bone lesion. Alignment is normal except for the anterolisthesis at L4-L5.  DEGENERATIVE CHANGES: Mild disc space narrowing. Mild-to-moderate facet hypertrophic changes at L4-L5 and L5-S1. SOFT TISSUES: No acute abnormality. IMPRESSION: 1. No evidence of acute traumatic injury. 2. Slight degenerative anterolisthesis at L4-5 with mild disc space narrowing and mild irregularity of the superior endplate of L4, favored degenerative. 3. Mild-to-moderate facet hypertrophic changes at L4-5 and L5-S1. Electronically signed by: Evalene Coho MD 01/12/2025 04:14 AM EST RP Workstation: [id number]   CT T-SPINE NO CHARGE Result Date: 01/12/2025 EXAM: CT THORACIC SPINE WITHOUT CONTRAST 01/12/2025 03:31:27 AM TECHNIQUE: CT of the thoracic spine was performed without the administration of intravenous contrast. Multiplanar reformatted images are provided for review. Automated exposure control, iterative reconstruction, and/or weight based adjustment of the mA/kV was utilized to reduce the radiation dose to as low as reasonably achievable. COMPARISON: None available. CLINICAL HISTORY: FINDINGS: BONES AND ALIGNMENT: The thoracic vertebrae maintain the height and alignment. No acute fracture or suspicious bone lesion. DEGENERATIVE CHANGES: There is mild disc space narrowing and anterior endplate ridging within the mid and lower thoracic spine. SOFT TISSUES: No acute abnormality. IMPRESSION: 1. No acute abnormality of the thoracic spine. 2. Mild disc space narrowing and anterior endplate ridging within the mid and lower thoracic spine. Electronically signed by: Evalene Coho MD 01/12/2025 04:12 AM EST RP Workstation: [id number]   CT CHEST ABDOMEN PELVIS W CONTRAST Result Date: 01/12/2025 EXAM: CT CHEST, ABDOMEN AND PELVIS WITH CONTRAST 01/12/2025 03:31:27 AM TECHNIQUE: CT of the chest, abdomen and pelvis was performed with the administration of intravenous contrast. 45 mL of iohexol  (OMNIPAQUE ) 350 MG/ML injection was administered. Multiplanar reformatted images are provided for review. Automated  exposure control, iterative reconstruction, and/or weight based adjustment of the mA/kV was utilized to reduce the radiation dose to as low as reasonably achievable. COMPARISON: CT angiogram of the chest dated 12/30/2024 and CT of the abdomen and pelvis dated 03/22/2024. CLINICAL HISTORY: Polytrauma, blunt. FINDINGS: CHEST: MEDIASTINUM AND LYMPH NODES: Heart and pericardium are unremarkable. The central airways are clear. No mediastinal, hilar or axillary lymphadenopathy. LUNGS AND PLEURA: There is some severe central lobular emphysema again demonstrated, predominantly involving the upper lobes. There is a 3 mm nodular opacity again demonstrated posteriorly within the right lower lobe on image 77 of series 5, unchanged. There are streaky opacities present posterior medially in the bases of the lower lobes bilaterally, as before. No pleural effusion. No pneumothorax. There is no evidence of acute traumatic injury. ABDOMEN AND PELVIS: LIVER: Unremarkable. GALLBLADDER AND BILE DUCTS: Unremarkable. No biliary ductal dilatation. SPLEEN: No acute abnormality. PANCREAS: No acute abnormality. ADRENAL GLANDS:  No acute abnormality. KIDNEYS, URETERS AND BLADDER: There are 2 nonobstructive calculi in the superior pole of the left kidney, with the larger calculus measuring 3 mm in diameter. No stones in the right kidney or ureters. There is no evidence of obstructive uropathy. No perinephric or periureteral stranding. Urinary bladder is unremarkable. GI AND BOWEL: Stomach demonstrates no acute abnormality. There is no bowel obstruction. REPRODUCTIVE ORGANS: No acute abnormality. PERITONEUM AND RETROPERITONEUM: No ascites. No free air. VASCULATURE: Aorta is normal in caliber. There is moderate calcific atheromatous disease within the abdominal aorta. ABDOMINAL AND PELVIS LYMPH NODES: No lymphadenopathy. REPRODUCTIVE ORGANS: No acute abnormality. BONES AND SOFT TISSUES: No acute osseous abnormality. No focal soft tissue  abnormality. IMPRESSION: 1. No evidence of acute traumatic injury. 2. Severe central lobular emphysema, predominantly involving the upper lobes. Pulmonary emphysema is an independent risk factor for lung cancer. Recommend consideration for evaluation for a low-dose CT lung cancer screening program. 3. 3 mm nodular opacity in the right lower lobe, unchanged. Optional non-contrast chest CT at 12 months as per Fleischner Society Guidelines (given high-risk status); if stable at 12 months, no further follow-up. 4. Streaky opacities in the lower lobe bases bilaterally, as before. 5. 2 nonobstructive calculi in the superior pole of the left kidney, with the larger calculus measuring 3 mm in diameter. No evidence of obstructive uropathy. 6. Moderate calcific atheromatous disease within the abdominal aorta. Electronically signed by: Evalene Coho MD 01/12/2025 04:11 AM EST RP Workstation: [id number]   CT Head Wo Contrast Result Date: 01/12/2025 CLINICAL DATA:  Recent fall with headaches and neck pain, initial encounter EXAM: CT HEAD WITHOUT CONTRAST CT CERVICAL SPINE WITHOUT CONTRAST TECHNIQUE: Multidetector CT imaging of the head and cervical spine was performed following the standard protocol without intravenous contrast. Multiplanar CT image reconstructions of the cervical spine were also generated. RADIATION DOSE REDUCTION: This exam was performed according to the departmental dose-optimization program which includes automated exposure control, adjustment of the mA and/or kV according to patient size and/or use of iterative reconstruction technique. COMPARISON:  None Available. FINDINGS: CT HEAD FINDINGS Brain: No evidence of acute infarction, hemorrhage, hydrocephalus, extra-axial collection or mass lesion/mass effect. Very mild atrophic changes are noted. Vascular: No hyperdense vessel or unexpected calcification. Skull: Normal. Negative for fracture or focal lesion. Sinuses/Orbits: No acute finding. Other:  None. CT CERVICAL SPINE FINDINGS Alignment: Loss of the normal cervical lordosis is noted. Skull base and vertebrae: 7 cervical segments are well visualized. Vertebral body height is well maintained. Increased kyphosis is noted at the C5 level. Anterolisthesis of C3 on C4 and C4 on C5 is seen of a degenerative nature. No acute facet abnormality is seen. Fusion of the C4 and C5 facets on the left is noted. Multilevel facet hypertrophic changes noted. Osteophytic changes are noted as well. No acute fracture or acute facet abnormality is seen. Soft tissues and spinal canal: Surrounding soft tissue structures are within normal limits. Upper chest: Visualized lung apices show emphysematous change. Other: None IMPRESSION: CT of the head: Mild atrophic changes without acute abnormality. CT of the cervical spine: Multilevel degenerative change with mid cervical kyphosis. No acute bony abnormality is noted. Electronically Signed   By: Oneil Devonshire M.D.   On: 01/12/2025 03:51   CT Cervical Spine Wo Contrast Result Date: 01/12/2025 CLINICAL DATA:  Recent fall with headaches and neck pain, initial encounter EXAM: CT HEAD WITHOUT CONTRAST CT CERVICAL SPINE WITHOUT CONTRAST TECHNIQUE: Multidetector CT imaging of the head and cervical spine was performed following  the standard protocol without intravenous contrast. Multiplanar CT image reconstructions of the cervical spine were also generated. RADIATION DOSE REDUCTION: This exam was performed according to the departmental dose-optimization program which includes automated exposure control, adjustment of the mA and/or kV according to patient size and/or use of iterative reconstruction technique. COMPARISON:  None Available. FINDINGS: CT HEAD FINDINGS Brain: No evidence of acute infarction, hemorrhage, hydrocephalus, extra-axial collection or mass lesion/mass effect. Very mild atrophic changes are noted. Vascular: No hyperdense vessel or unexpected calcification. Skull: Normal.  Negative for fracture or focal lesion. Sinuses/Orbits: No acute finding. Other: None. CT CERVICAL SPINE FINDINGS Alignment: Loss of the normal cervical lordosis is noted. Skull base and vertebrae: 7 cervical segments are well visualized. Vertebral body height is well maintained. Increased kyphosis is noted at the C5 level. Anterolisthesis of C3 on C4 and C4 on C5 is seen of a degenerative nature. No acute facet abnormality is seen. Fusion of the C4 and C5 facets on the left is noted. Multilevel facet hypertrophic changes noted. Osteophytic changes are noted as well. No acute fracture or acute facet abnormality is seen. Soft tissues and spinal canal: Surrounding soft tissue structures are within normal limits. Upper chest: Visualized lung apices show emphysematous change. Other: None IMPRESSION: CT of the head: Mild atrophic changes without acute abnormality. CT of the cervical spine: Multilevel degenerative change with mid cervical kyphosis. No acute bony abnormality is noted. Electronically Signed   By: Oneil Devonshire M.D.   On: 01/12/2025 03:51    Cardiac monitor shows normal sinus rhythm, per my interpretation.  Procedures   Medications Ordered in the ED  iohexol  (OMNIPAQUE ) 350 MG/ML injection 45 mL (45 mLs Intravenous Contrast Given 01/12/25 0332)  sodium chloride  0.9 % bolus 1,000 mL (1,000 mLs Intravenous New Bag/Given 01/12/25 0519)                                    Medical Decision Making Amount and/or Complexity of Data Reviewed Labs: ordered. Radiology: ordered.  Risk Prescription drug management.   Fall with injury to left scapular area, preceded by dizziness.  Because of mechanism of injury, I have ordered trauma pan scans, also electrocardiogram and screening labs.  X-rays and CTs show no evidence of acute traumatic injury.  I have independently viewed all of the images, and agree with the radiologist's interpretation.  I have reviewed her electrocardiogram, my interpretation is  nonspecific ST changes unchanged from prior.  I have reviewed her laboratory tests, and my interpretation is leukocytosis which had been present previously, normal troponin x 2, elevated random glucose level, elevated BUN with normal creatinine suggesting some degree of dehydration.  I ordered orthostatic vital signs which did show her heart rate increased.  This may have accounted for her dizziness which led to the fall.  I ordered IV fluids.  She feels better following this.  I have instructed her to apply ice to sore areas, take acetaminophen  as needed for pain, follow-up with primary care provider.     Final diagnoses:  Fall in home, initial encounter  Contusion of upper back, left, initial encounter  Dizziness  Elevated random blood glucose level    ED Discharge Orders     None          Raford Lenis, MD 01/12/25 [phone]  "

## 2025-01-12 NOTE — ED Notes (Signed)
 Pt ambulated to restroom with assistance. Pt extremely unsteady. States she uses a walker at home.

## 2025-01-16 ENCOUNTER — Other Ambulatory Visit: Payer: Self-pay | Admitting: Physician Assistant

## 2025-01-16 NOTE — Telephone Encounter (Signed)
 Left message for Abigail Lamb to call regarding the below. I am not sure what she needs.

## 2025-01-16 NOTE — Telephone Encounter (Signed)
 Received Email from [place] from [facility] , [place] is asking  did we receive Pt order  # [id number].  [facility] is stating they are missing it . Can you Assist    Callback number is [phone]

## 2025-01-17 NOTE — Telephone Encounter (Signed)
 Called and spoke with [facility] and paperwork faxed today.

## 2025-01-18 ENCOUNTER — Encounter: Payer: Self-pay | Admitting: Internal Medicine

## 2025-01-18 ENCOUNTER — Other Ambulatory Visit: Payer: Self-pay | Admitting: Internal Medicine

## 2025-01-19 ENCOUNTER — Ambulatory Visit (INDEPENDENT_AMBULATORY_CARE_PROVIDER_SITE_OTHER): Admitting: Internal Medicine

## 2025-01-19 VITALS — BP 130/80 | HR 90 | Temp 97.9°F | Ht 61.5 in | Wt 73.4 lb

## 2025-01-19 DIAGNOSIS — L280 Lichen simplex chronicus: Secondary | ICD-10-CM

## 2025-01-19 DIAGNOSIS — J439 Emphysema, unspecified: Secondary | ICD-10-CM

## 2025-01-19 DIAGNOSIS — R64 Cachexia: Secondary | ICD-10-CM | POA: Diagnosis not present

## 2025-01-19 DIAGNOSIS — F02B11 Dementia in other diseases classified elsewhere, moderate, with agitation: Secondary | ICD-10-CM

## 2025-01-19 DIAGNOSIS — Z23 Encounter for immunization: Secondary | ICD-10-CM

## 2025-01-19 DIAGNOSIS — Z0001 Encounter for general adult medical examination with abnormal findings: Secondary | ICD-10-CM

## 2025-01-19 DIAGNOSIS — G301 Alzheimer's disease with late onset: Secondary | ICD-10-CM | POA: Diagnosis not present

## 2025-01-19 DIAGNOSIS — Z0000 Encounter for general adult medical examination without abnormal findings: Secondary | ICD-10-CM | POA: Diagnosis not present

## 2025-01-19 DIAGNOSIS — F5101 Primary insomnia: Secondary | ICD-10-CM | POA: Diagnosis not present

## 2025-01-19 DIAGNOSIS — J449 Chronic obstructive pulmonary disease, unspecified: Secondary | ICD-10-CM | POA: Diagnosis not present

## 2025-01-19 DIAGNOSIS — F411 Generalized anxiety disorder: Secondary | ICD-10-CM

## 2025-01-19 MED ORDER — CLONAZEPAM 0.5 MG PO TABS: 0.5000 mg | ORAL_TABLET | Freq: Three times a day (TID) | ORAL | 1 refills | Status: DC | PRN

## 2025-01-19 MED ORDER — BREXPIPRAZOLE 0.25 MG PO TABS: 0.2500 mg | ORAL_TABLET | Freq: Every day | ORAL | 0 refills | Status: AC

## 2025-01-19 MED ORDER — COVID-19 MRNA VAC-TRIS(PFIZER) 30 MCG/0.3ML IM SUSY: 0.3000 mL | PREFILLED_SYRINGE | Freq: Once | INTRAMUSCULAR | 0 refills | Status: AC

## 2025-01-19 MED ORDER — LORAZEPAM 0.5 MG PO TABS: 0.5000 mg | ORAL_TABLET | Freq: Two times a day (BID) | ORAL | 1 refills | Status: AC | PRN

## 2025-01-19 NOTE — Progress Notes (Signed)
 "  Subjective:  Patient ID: Abigail Lamb, female    DOB: 07/16/1951  Age: 74 y.o. MRN: [id number]  CC: Anxiety (Atarax  TID is not working patient anxiety is up and she is becoming dizzy. ), Annual Exam, and COPD   HPI Abigail Lamb presents for a CPX and f/up ---    Discussed the use of AI scribe software for clinical note transcription with the patient, who gave verbal consent to proceed.  History of Present Illness Abigail Lamb is a 74 year old female with COPD who presents with anxiety and difficulty breathing.  She was recently hospitalized due to difficulty breathing, during which her benzodiazepine was discontinued, and she was started on hydroxyzine  for anxiety management. Her breathing has improved since the hospitalization, but she feels that hydroxyzine  is not effectively managing her anxiety and reports feeling like a 'different person' since the medication change.  Her anxiety causes a sensation of heat in the back of her throat and episodes where she 'almost checks out' and cannot remember things. It is associated with involuntary movements, difficulty standing or walking, and frequent falls. She recently fell and hit her back on a coffee table, leading to an emergency room visit where x-rays were performed to rule out fractures.  There have been observations of increased agitation, anger, verbal aggression, and difficulty sleeping. She has stopped smoking cigarettes and has gained some weight, which her family views positively. However, she struggles with focusing and performing daily activities due to her anxiety. She does not take showers.  She is currently taking hydroxyzine  50 mg, but she feels it causes side effects and does not alleviate her anxiety. She is concerned about her inability to use her hands effectively, describing them as numb and tingly, which affects her ability to prepare food. Her family assists with meal preparation.  She has a history of COPD, and her  family mentions that the pulmonary group advised against benzodiazepines due to her pulmonary condition. She has had a recent EKG and lab work done during her hospital stay.     Outpatient Medications Prior to Visit  Medication Sig Dispense Refill   albuterol  (VENTOLIN  HFA) 108 (90 Base) MCG/ACT inhaler Inhale 2 puffs into the lungs every 4 (four) hours as needed for shortness of breath or wheezing.     budesonide -glycopyrrolate -formoterol  (BREZTRI  AEROSPHERE) 160-9-4.8 MCG/ACT AERO inhaler Inhale 2 puffs into the lungs in the morning and at bedtime. 3 each 6   celecoxib  (CELEBREX ) 50 MG capsule TAKE 1 CAPSULE BY MOUTH 2 TIMES DAILY. 60 capsule 1   Cholecalciferol (VITAMIN D3) 125 MCG (5000 UT) CAPS Take 5,000 Units by mouth daily.     clindamycin (CLEOCIN T) 1 % external solution Apply 1 Application topically 2 (two) times daily as needed (for irritation- affected area).     colesevelam  (WELCHOL ) 625 MG tablet TAKE 3 TABLETS BY MOUTH TWICE A DAY WITH MEAL (Patient taking differently: Take 1,875 mg by mouth in the morning and at bedtime.) 180 tablet 3   dicyclomine  (BENTYL ) 10 MG capsule TAKE 1 CAPSULE (10 MG TOTAL) BY MOUTH 4 TIMES A DAY BEFORE MEALS AND AT BEDTIME 360 capsule 0   DULoxetine  (CYMBALTA ) 30 MG capsule TAKE 3 CAPSULES (90 MG TOTAL) BY MOUTH DAILY. FOR TOTAL OF 90 MG DAILY (Patient taking differently: Take 30-60 mg by mouth See admin instructions. Take 60 mg by mouth in the morning and 30 mg at bedtime) 270 capsule 1   famotidine  (PEPCID ) 40 MG tablet TAKE 1 TABLET  BY MOUTH EVERYDAY AT BEDTIME 30 tablet 0   fluocinonide -emollient (LIDEX -E) 0.05 % cream Apply 1 application topically 2 (two) times daily. (Patient taking differently: Apply 1 application  topically 2 (two) times daily as needed (for inflammation/redness - affected area).) 60 g 3   fluticasone  (FLONASE ) 50 MCG/ACT nasal spray Place 1 spray into both nostrils daily. (Patient taking differently: Place 1 spray into both  nostrils daily as needed for allergies or rhinitis.) 16 g 11   folic acid  (FOLVITE ) 1 MG tablet Take 1 mg by mouth daily.     hydroxychloroquine  (PLAQUENIL ) 200 MG tablet Take 1 tablet (200 mg total) by mouth daily. 90 tablet 0   mupirocin  ointment (BACTROBAN ) 2 % Apply 1 Application topically 2 (two) times daily. (Patient taking differently: Apply 1 Application topically 2 (two) times daily as needed (for skin infections).) 30 g 2   ondansetron  (ZOFRAN -ODT) 4 MG disintegrating tablet Take 1 tablet (4 mg total) by mouth every 8 (eight) hours as needed for nausea or vomiting. (Patient taking differently: Take 4 mg by mouth every 8 (eight) hours as needed for nausea or vomiting (dissolve orally).) 20 tablet 5   pantoprazole  (PROTONIX ) 40 MG tablet TAKE 1 TABLET BY MOUTH EVERY DAY (Patient taking differently: Take 40 mg by mouth daily before breakfast.) 90 tablet 1   sulfaSALAzine  (AZULFIDINE ) 500 MG tablet Take 2 tablets (1,000 mg total) by mouth 2 (two) times daily. 120 tablet 2   Zoledronic  Acid (RECLAST  IV) Inject into the vein. Infusion: 12/03/2023     clonazePAM  (KLONOPIN ) 0.5 MG tablet Take 1 tablet (0.5 mg total) by mouth 2 (two) times daily as needed for anxiety. 60 tablet 0   hydrOXYzine  (ATARAX ) 50 MG tablet Take 1 tablet (50 mg total) by mouth 3 (three) times daily as needed. 90 tablet 0   No facility-administered medications prior to visit.    ROS Review of Systems  Constitutional:  Negative for appetite change, chills, diaphoresis, fatigue and fever.  HENT: Negative.    Eyes: Negative.  Negative for visual disturbance.  Respiratory:  Positive for shortness of breath. Negative for cough, chest tightness and wheezing.   Cardiovascular:  Negative for chest pain, palpitations and leg swelling.  Gastrointestinal: Negative.  Negative for abdominal pain, constipation, diarrhea, nausea and vomiting.  Endocrine: Negative.   Genitourinary: Negative.  Negative for difficulty urinating and  dysuria.  Musculoskeletal:  Positive for arthralgias and gait problem. Negative for myalgias.  Neurological:  Negative for dizziness, syncope, speech difficulty and weakness.  Hematological:  Negative for adenopathy. Does not bruise/bleed easily.  Psychiatric/Behavioral:  Positive for agitation, behavioral problems, confusion, decreased concentration, dysphoric mood and sleep disturbance. Negative for hallucinations, self-injury and suicidal ideas. The patient is nervous/anxious. The patient is not hyperactive.     Objective:  BP 130/80 (BP Location: Left Arm, Patient Position: Sitting, Cuff Size: Small)   Pulse 90   Temp 97.9 F (36.6 C) (Oral)   Ht 5' 1.5 (1.562 m)   Wt 73 lb 6.4 oz (33.3 kg)   SpO2 93%   BMI 13.64 kg/m   BP Readings from Last 3 Encounters:  01/19/25 130/80  01/12/25 124/65  01/04/25 101/60    Wt Readings from Last 3 Encounters:  01/19/25 73 lb 6.4 oz (33.3 kg)  01/12/25 70 lb (31.8 kg)  01/04/25 71 lb (32.2 kg)    Physical Exam Vitals reviewed.  Constitutional:      General: She is not in acute distress.    Appearance: She is  cachectic. She is ill-appearing. She is not toxic-appearing or diaphoretic.  HENT:     Nose: Nose normal.     Mouth/Throat:     Mouth: Mucous membranes are moist.  Eyes:     General: No scleral icterus.    Conjunctiva/sclera: Conjunctivae normal.  Cardiovascular:     Rate and Rhythm: Normal rate and regular rhythm.     Heart sounds: No murmur heard.    No friction rub. No gallop.  Pulmonary:     Effort: Pulmonary effort is normal.     Breath sounds: No stridor. No wheezing, rhonchi or rales.  Abdominal:     General: Abdomen is scaphoid. Bowel sounds are normal. There is no distension.     Palpations: Abdomen is soft.     Tenderness: There is no abdominal tenderness. There is no guarding.  Musculoskeletal:        General: Deformity present. No swelling or tenderness. Normal range of motion.     Cervical back: Neck  supple.     Right lower leg: No edema.     Left lower leg: No edema.  Lymphadenopathy:     Cervical: No cervical adenopathy.  Skin:    General: Skin is dry.     Findings: No rash.  Neurological:     General: No focal deficit present.     Mental Status: She is alert.  Psychiatric:        Attention and Perception: She is inattentive.        Mood and Affect: Mood is anxious. Affect is tearful.        Speech: Speech is tangential.        Behavior: Behavior normal.        Thought Content: Thought content normal. Thought content is not paranoid or delusional. Thought content does not include homicidal or suicidal ideation.        Cognition and Memory: Cognition is impaired. Memory is impaired. She exhibits impaired recent memory and impaired remote memory.     Lab Results  Component Value Date   WBC 15.4 (H) 01/12/2025   HGB 13.6 01/12/2025   HCT 41.7 01/12/2025   PLT 198 01/12/2025   GLUCOSE 141 (H) 01/12/2025   CHOL 164 10/27/2022   TRIG 132.0 10/27/2022   HDL 66.20 10/27/2022   LDLCALC 71 10/27/2022   ALT 54 (H) 12/28/2024   AST 40 12/28/2024   NA 139 01/12/2025   K 4.5 01/12/2025   CL 103 01/12/2025   CREATININE 0.90 01/12/2025   BUN 33 (H) 01/12/2025   CO2 26 01/12/2025   TSH 2.13 09/19/2024   HGBA1C 5.5 08/02/2020    CT L-SPINE NO CHARGE Result Date: 01/12/2025 EXAM: CT OF THE LUMBAR SPINE WITHOUT CONTRAST 01/12/2025 03:31:27 AM TECHNIQUE: CT of the lumbar spine was performed without the administration of intravenous contrast. Multiplanar reformatted images are provided for review. Automated exposure control, iterative reconstruction, and/or weight based adjustment of the mA/kV was utilized to reduce the radiation dose to as low as reasonably achievable. COMPARISON: None available. CLINICAL HISTORY: FINDINGS: BONES AND ALIGNMENT: Normal vertebral body heights. Slight degenerative anterolisthesis at L4-L5. Mild irregularity of the superior endplate of L4. No acute fracture  or suspicious bone lesion. Alignment is normal except for the anterolisthesis at L4-L5. DEGENERATIVE CHANGES: Mild disc space narrowing. Mild-to-moderate facet hypertrophic changes at L4-L5 and L5-S1. SOFT TISSUES: No acute abnormality. IMPRESSION: 1. No evidence of acute traumatic injury. 2. Slight degenerative anterolisthesis at L4-5 with mild disc space narrowing and  mild irregularity of the superior endplate of L4, favored degenerative. 3. Mild-to-moderate facet hypertrophic changes at L4-5 and L5-S1. Electronically signed by: Evalene Coho MD 01/12/2025 04:14 AM EST RP Workstation: [id number]   CT T-SPINE NO CHARGE Result Date: 01/12/2025 EXAM: CT THORACIC SPINE WITHOUT CONTRAST 01/12/2025 03:31:27 AM TECHNIQUE: CT of the thoracic spine was performed without the administration of intravenous contrast. Multiplanar reformatted images are provided for review. Automated exposure control, iterative reconstruction, and/or weight based adjustment of the mA/kV was utilized to reduce the radiation dose to as low as reasonably achievable. COMPARISON: None available. CLINICAL HISTORY: FINDINGS: BONES AND ALIGNMENT: The thoracic vertebrae maintain the height and alignment. No acute fracture or suspicious bone lesion. DEGENERATIVE CHANGES: There is mild disc space narrowing and anterior endplate ridging within the mid and lower thoracic spine. SOFT TISSUES: No acute abnormality. IMPRESSION: 1. No acute abnormality of the thoracic spine. 2. Mild disc space narrowing and anterior endplate ridging within the mid and lower thoracic spine. Electronically signed by: Evalene Coho MD 01/12/2025 04:12 AM EST RP Workstation: [id number]   CT CHEST ABDOMEN PELVIS W CONTRAST Result Date: 01/12/2025 EXAM: CT CHEST, ABDOMEN AND PELVIS WITH CONTRAST 01/12/2025 03:31:27 AM TECHNIQUE: CT of the chest, abdomen and pelvis was performed with the administration of intravenous contrast. 45 mL of iohexol  (OMNIPAQUE ) 350 MG/ML  injection was administered. Multiplanar reformatted images are provided for review. Automated exposure control, iterative reconstruction, and/or weight based adjustment of the mA/kV was utilized to reduce the radiation dose to as low as reasonably achievable. COMPARISON: CT angiogram of the chest dated 12/30/2024 and CT of the abdomen and pelvis dated 03/22/2024. CLINICAL HISTORY: Polytrauma, blunt. FINDINGS: CHEST: MEDIASTINUM AND LYMPH NODES: Heart and pericardium are unremarkable. The central airways are clear. No mediastinal, hilar or axillary lymphadenopathy. LUNGS AND PLEURA: There is some severe central lobular emphysema again demonstrated, predominantly involving the upper lobes. There is a 3 mm nodular opacity again demonstrated posteriorly within the right lower lobe on image 77 of series 5, unchanged. There are streaky opacities present posterior medially in the bases of the lower lobes bilaterally, as before. No pleural effusion. No pneumothorax. There is no evidence of acute traumatic injury. ABDOMEN AND PELVIS: LIVER: Unremarkable. GALLBLADDER AND BILE DUCTS: Unremarkable. No biliary ductal dilatation. SPLEEN: No acute abnormality. PANCREAS: No acute abnormality. ADRENAL GLANDS: No acute abnormality. KIDNEYS, URETERS AND BLADDER: There are 2 nonobstructive calculi in the superior pole of the left kidney, with the larger calculus measuring 3 mm in diameter. No stones in the right kidney or ureters. There is no evidence of obstructive uropathy. No perinephric or periureteral stranding. Urinary bladder is unremarkable. GI AND BOWEL: Stomach demonstrates no acute abnormality. There is no bowel obstruction. REPRODUCTIVE ORGANS: No acute abnormality. PERITONEUM AND RETROPERITONEUM: No ascites. No free air. VASCULATURE: Aorta is normal in caliber. There is moderate calcific atheromatous disease within the abdominal aorta. ABDOMINAL AND PELVIS LYMPH NODES: No lymphadenopathy. REPRODUCTIVE ORGANS: No acute  abnormality. BONES AND SOFT TISSUES: No acute osseous abnormality. No focal soft tissue abnormality. IMPRESSION: 1. No evidence of acute traumatic injury. 2. Severe central lobular emphysema, predominantly involving the upper lobes. Pulmonary emphysema is an independent risk factor for lung cancer. Recommend consideration for evaluation for a low-dose CT lung cancer screening program. 3. 3 mm nodular opacity in the right lower lobe, unchanged. Optional non-contrast chest CT at 12 months as per Fleischner Society Guidelines (given high-risk status); if stable at 12 months, no further follow-up. 4. Streaky opacities in the lower  lobe bases bilaterally, as before. 5. 2 nonobstructive calculi in the superior pole of the left kidney, with the larger calculus measuring 3 mm in diameter. No evidence of obstructive uropathy. 6. Moderate calcific atheromatous disease within the abdominal aorta. Electronically signed by: Evalene Coho MD 01/12/2025 04:11 AM EST RP Workstation: [id number]   CT Head Wo Contrast Result Date: 01/12/2025 CLINICAL DATA:  Recent fall with headaches and neck pain, initial encounter EXAM: CT HEAD WITHOUT CONTRAST CT CERVICAL SPINE WITHOUT CONTRAST TECHNIQUE: Multidetector CT imaging of the head and cervical spine was performed following the standard protocol without intravenous contrast. Multiplanar CT image reconstructions of the cervical spine were also generated. RADIATION DOSE REDUCTION: This exam was performed according to the departmental dose-optimization program which includes automated exposure control, adjustment of the mA and/or kV according to patient size and/or use of iterative reconstruction technique. COMPARISON:  None Available. FINDINGS: CT HEAD FINDINGS Brain: No evidence of acute infarction, hemorrhage, hydrocephalus, extra-axial collection or mass lesion/mass effect. Very mild atrophic changes are noted. Vascular: No hyperdense vessel or unexpected calcification. Skull:  Normal. Negative for fracture or focal lesion. Sinuses/Orbits: No acute finding. Other: None. CT CERVICAL SPINE FINDINGS Alignment: Loss of the normal cervical lordosis is noted. Skull base and vertebrae: 7 cervical segments are well visualized. Vertebral body height is well maintained. Increased kyphosis is noted at the C5 level. Anterolisthesis of C3 on C4 and C4 on C5 is seen of a degenerative nature. No acute facet abnormality is seen. Fusion of the C4 and C5 facets on the left is noted. Multilevel facet hypertrophic changes noted. Osteophytic changes are noted as well. No acute fracture or acute facet abnormality is seen. Soft tissues and spinal canal: Surrounding soft tissue structures are within normal limits. Upper chest: Visualized lung apices show emphysematous change. Other: None IMPRESSION: CT of the head: Mild atrophic changes without acute abnormality. CT of the cervical spine: Multilevel degenerative change with mid cervical kyphosis. No acute bony abnormality is noted. Electronically Signed   By: Oneil Devonshire M.D.   On: 01/12/2025 03:51   CT Cervical Spine Wo Contrast Result Date: 01/12/2025 CLINICAL DATA:  Recent fall with headaches and neck pain, initial encounter EXAM: CT HEAD WITHOUT CONTRAST CT CERVICAL SPINE WITHOUT CONTRAST TECHNIQUE: Multidetector CT imaging of the head and cervical spine was performed following the standard protocol without intravenous contrast. Multiplanar CT image reconstructions of the cervical spine were also generated. RADIATION DOSE REDUCTION: This exam was performed according to the departmental dose-optimization program which includes automated exposure control, adjustment of the mA and/or kV according to patient size and/or use of iterative reconstruction technique. COMPARISON:  None Available. FINDINGS: CT HEAD FINDINGS Brain: No evidence of acute infarction, hemorrhage, hydrocephalus, extra-axial collection or mass lesion/mass effect. Very mild atrophic changes  are noted. Vascular: No hyperdense vessel or unexpected calcification. Skull: Normal. Negative for fracture or focal lesion. Sinuses/Orbits: No acute finding. Other: None. CT CERVICAL SPINE FINDINGS Alignment: Loss of the normal cervical lordosis is noted. Skull base and vertebrae: 7 cervical segments are well visualized. Vertebral body height is well maintained. Increased kyphosis is noted at the C5 level. Anterolisthesis of C3 on C4 and C4 on C5 is seen of a degenerative nature. No acute facet abnormality is seen. Fusion of the C4 and C5 facets on the left is noted. Multilevel facet hypertrophic changes noted. Osteophytic changes are noted as well. No acute fracture or acute facet abnormality is seen. Soft tissues and spinal canal: Surrounding soft tissue structures are  within normal limits. Upper chest: Visualized lung apices show emphysematous change. Other: None IMPRESSION: CT of the head: Mild atrophic changes without acute abnormality. CT of the cervical spine: Multilevel degenerative change with mid cervical kyphosis. No acute bony abnormality is noted. Electronically Signed   By: Oneil Devonshire M.D.   On: 01/12/2025 03:51    Assessment & Plan:  GOLD C COPD by GOLD classification (HCC)- Improved with smoking cessation.  Pulmonary emphysema (HCC) -     Ambulatory referral to Hospice -     COVID-19 mRNA Vac-TriS(Pfizer); Inject 0.3 mLs into the muscle once for 1 dose.  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  Dispense: 30 tablet; Refill: 0 -     Ambulatory referral to Hospice  Encounter for general adult medical examination with abnormal findings- Exam completed, recent labs reviewed, vaccines reviewed and updated, no cancer screenings indicated, pt ed material was given.   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  Dispense: 180 tablet; Refill: 1  Primary insomnia -     LORazepam ; Take 1 tablet (0.5 mg total) by mouth 2 (two) times daily as needed for anxiety.  Dispense: 180 tablet; Refill: 1  GAD (generalized anxiety disorder) -     LORazepam ; Take 1 tablet (0.5 mg total) by mouth 2 (two) times daily as needed for anxiety.  Dispense: 180 tablet; Refill: 1     Follow-up: Return in about 6 months (around 07/19/2025).  Debby Molt, MD "

## 2025-01-19 NOTE — Patient Instructions (Signed)

## 2025-01-23 ENCOUNTER — Ambulatory Visit: Payer: Medicare Other

## 2025-01-26 ENCOUNTER — Ambulatory Visit

## 2025-01-30 ENCOUNTER — Other Ambulatory Visit: Payer: Self-pay | Admitting: Internal Medicine

## 2025-01-30 DIAGNOSIS — M0579 Rheumatoid arthritis with rheumatoid factor of multiple sites without organ or systems involvement: Secondary | ICD-10-CM

## 2025-02-14 ENCOUNTER — Ambulatory Visit: Admitting: Neurology

## 2025-04-25 ENCOUNTER — Ambulatory Visit: Admitting: Rheumatology

## 2025-06-22 ENCOUNTER — Ambulatory Visit: Admitting: Emergency Medicine

## 2025-07-19 ENCOUNTER — Ambulatory Visit: Admitting: Internal Medicine

## 2025-08-28 ENCOUNTER — Ambulatory Visit
